# Patient Record
Sex: Male | Born: 1940 | Race: White | Hispanic: No | Marital: Single | Smoking: Never smoker
Health system: Southern US, Community
[De-identification: ages and names within clinical notes are randomized; demographics above are authoritative.]

---

## 2020-09-18 ENCOUNTER — Encounter (HOSPITAL_BASED_OUTPATIENT_CLINIC_OR_DEPARTMENT_OTHER): Payer: Self-pay | Admitting: *Deleted

## 2020-09-18 ENCOUNTER — Other Ambulatory Visit: Payer: Self-pay

## 2020-09-18 ENCOUNTER — Inpatient Hospital Stay (HOSPITAL_BASED_OUTPATIENT_CLINIC_OR_DEPARTMENT_OTHER)
Admission: EM | Admit: 2020-09-18 | Discharge: 2020-09-23 | DRG: 330 | Disposition: A | Discharge status: Home / Self Care | Payer: Medicare Other | Attending: Internal Medicine | Admitting: Internal Medicine

## 2020-09-18 DIAGNOSIS — C187 Malignant neoplasm of sigmoid colon: Secondary | ICD-10-CM | POA: Diagnosis not present

## 2020-09-18 DIAGNOSIS — R918 Other nonspecific abnormal finding of lung field: Secondary | ICD-10-CM | POA: Diagnosis present

## 2020-09-18 DIAGNOSIS — K56609 Unspecified intestinal obstruction, unspecified as to partial versus complete obstruction: Secondary | ICD-10-CM | POA: Diagnosis present

## 2020-09-18 DIAGNOSIS — Z20822 Contact with and (suspected) exposure to covid-19: Secondary | ICD-10-CM | POA: Diagnosis present

## 2020-09-18 DIAGNOSIS — J302 Other seasonal allergic rhinitis: Secondary | ICD-10-CM | POA: Diagnosis present

## 2020-09-18 DIAGNOSIS — R32 Unspecified urinary incontinence: Secondary | ICD-10-CM | POA: Diagnosis present

## 2020-09-18 DIAGNOSIS — Z9049 Acquired absence of other specified parts of digestive tract: Secondary | ICD-10-CM

## 2020-09-18 DIAGNOSIS — S3729XA Other injury of bladder, initial encounter: Secondary | ICD-10-CM | POA: Diagnosis not present

## 2020-09-18 DIAGNOSIS — K56699 Other intestinal obstruction unspecified as to partial versus complete obstruction: Secondary | ICD-10-CM

## 2020-09-18 DIAGNOSIS — Z6832 Body mass index (BMI) 32.0-32.9, adult: Secondary | ICD-10-CM

## 2020-09-18 DIAGNOSIS — E669 Obesity, unspecified: Secondary | ICD-10-CM | POA: Diagnosis present

## 2020-09-18 DIAGNOSIS — I700 Atherosclerosis of aorta: Secondary | ICD-10-CM | POA: Diagnosis present

## 2020-09-18 DIAGNOSIS — N400 Enlarged prostate without lower urinary tract symptoms: Secondary | ICD-10-CM | POA: Diagnosis present

## 2020-09-18 DIAGNOSIS — K56600 Partial intestinal obstruction, unspecified as to cause: Secondary | ICD-10-CM | POA: Diagnosis present

## 2020-09-18 NOTE — ED Triage Notes (Signed)
C/o constipation and bloating  x 4 days, laxative today w/o relief

## 2020-09-18 NOTE — ED Provider Notes (Addendum)
[facility] Provider Note: Georgena Spurling, MD, FACEP  CSN: [id number] MRN: [id number] ARRIVAL: 09/18/20 at 2311 ROOM: [place]  Constipation   HISTORY OF PRESENT ILLNESS  09/18/20 11:56 PM Craig Best is a 80 y.o. male who has not had a bowel movement for the past 3 days.  He feels his abdomen is distended and he is having pains which he describes as "gas pains".  He rates these as a 4 out of 10.  Nothing makes them better or worse.  He has taken a stool softener and a laxative (Dulcolax) without relief.  He has been able to eat but he feels constantly nauseated.  He has not been vomiting.  He has had excessive belching.  He had a bumpy tractor ride about a week ago and is concerned this may have initiated his problems.  This resulted in him having some soreness in his left lower and right lower quadrants which preceded his constipation.   History reviewed. No pertinent past medical history.  History reviewed. No pertinent surgical history.  No family history on file.  Social History   Tobacco Use   Smoking status: Never    Passive exposure: Never   Smokeless tobacco: Never  Substance Use Topics   Alcohol use: Not Currently   Drug use: Not Currently    Prior to Admission medications   Not on File    Allergies Patient has no known allergies.   REVIEW OF SYSTEMS  Negative except as noted here or in the History of Present Illness.   PHYSICAL EXAMINATION  Initial Vital Signs Blood pressure (!) 165/89, pulse 89, temperature 98 F (36.7 C), temperature source Oral, resp. rate 18, height 5\' 8"  (1.727 m), weight 93 kg, SpO2 98 %.  Examination General: Well-developed, well-nourished male in no acute distress; appearance consistent with age of record HENT: normocephalic; atraumatic Eyes: pupils equal, round and reactive to light; extraocular muscles intact Neck: supple Heart: regular rate and rhythm Abdomen: soft; distended; nontender;  bowel sounds hypoactive Rectal: Normal sphincter tone; soft formed stool in rectal vault without impaction Extremities: No deformity; full range of motion; pulses normal Neurologic: Awake, alert and oriented; motor function intact in all extremities and symmetric; no facial droop Skin: Warm and dry Psychiatric: Normal mood and affect   RESULTS  Summary of this visit's results, reviewed and interpreted by myself:   EKG Interpretation  Date/Time:    Ventricular Rate:    PR Interval:    QRS Duration:   QT Interval:    QTC Calculation:   R Axis:     Text Interpretation:         Laboratory Studies: Results for orders placed or performed during the hospital encounter of 09/18/20 (from the past 24 hour(s))  CBC with Differential/Platelet     Status: Abnormal   Collection Time: 09/19/20 12:06 AM  Result Value Ref Range   WBC 13.0 (H) 4.0 - 10.5 K/uL   RBC 5.22 4.22 - 5.81 MIL/uL   Hemoglobin 16.6 13.0 - 17.0 g/dL   HCT 48.1 39.0 - 52.0 %   MCV 92.1 80.0 - 100.0 fL   MCH 31.8 26.0 - 34.0 pg   MCHC 34.5 30.0 - 36.0 g/dL   RDW 13.3 11.5 - 15.5 %   Platelets 395 150 - 400 K/uL   nRBC 0.0 0.0 - 0.2 %   Neutrophils Relative % 75 %   Neutro Abs 9.7 (H) 1.7 - 7.7 K/uL   Lymphocytes  Relative 17 %   Lymphs Abs 2.2 0.7 - 4.0 K/uL   Monocytes Relative 6 %   Monocytes Absolute 0.8 0.1 - 1.0 K/uL   Eosinophils Relative 1 %   Eosinophils Absolute 0.1 0.0 - 0.5 K/uL   Basophils Relative 1 %   Basophils Absolute 0.1 0.0 - 0.1 K/uL   Immature Granulocytes 0 %   Abs Immature Granulocytes 0.05 0.00 - 0.07 K/uL  Basic metabolic panel     Status: Abnormal   Collection Time: 09/19/20 12:06 AM  Result Value Ref Range   Sodium 136 135 - 145 mmol/L   Potassium 3.8 3.5 - 5.1 mmol/L   Chloride 98 98 - 111 mmol/L   CO2 26 22 - 32 mmol/L   Glucose, Bld 126 (H) 70 - 99 mg/dL   BUN 14 8 - 23 mg/dL   Creatinine, Ser 1.05 0.61 - 1.24 mg/dL   Calcium 8.9 8.9 - 10.3 mg/dL   GFR, Estimated >60 >60  mL/min   Anion gap 12 5 - 15  Occult blood card to lab, stool Provider will collect     Status: None   Collection Time: 09/19/20 12:07 AM  Result Value Ref Range   Fecal Occult Bld NEGATIVE NEGATIVE  Urinalysis, Routine w reflex microscopic Urine, Clean Catch     Status: Abnormal   Collection Time: 09/19/20  2:00 AM  Result Value Ref Range   Color, Urine YELLOW YELLOW   APPearance CLEAR CLEAR   Specific Gravity, Urine 1.010 1.005 - 1.030   pH 5.0 5.0 - 8.0   Glucose, UA NEGATIVE NEGATIVE mg/dL   Hgb urine dipstick LARGE (A) NEGATIVE   Bilirubin Urine NEGATIVE NEGATIVE   Ketones, ur 15 (A) NEGATIVE mg/dL   Protein, ur NEGATIVE NEGATIVE mg/dL   Nitrite NEGATIVE NEGATIVE   Leukocytes,Ua NEGATIVE NEGATIVE  Urinalysis, Microscopic (reflex)     Status: None   Collection Time: 09/19/20  2:00 AM  Result Value Ref Range   RBC / HPF 6-10 0 - 5 RBC/hpf   WBC, UA 0-5 0 - 5 WBC/hpf   Bacteria, UA NONE SEEN NONE SEEN   Squamous Epithelial / LPF 0-5 0 - 5  Resp Panel by RT-PCR (Flu A&B, Covid) Nasopharyngeal Swab     Status: None   Collection Time: 09/19/20  2:13 AM   Specimen: Nasopharyngeal Swab; Nasopharyngeal(NP) swabs in vial transport medium  Result Value Ref Range   SARS Coronavirus 2 by RT PCR NEGATIVE NEGATIVE   Influenza A by PCR NEGATIVE NEGATIVE   Influenza B by PCR NEGATIVE NEGATIVE   Imaging Studies: CT ABDOMEN PELVIS W CONTRAST  Result Date: 09/19/2020 CLINICAL DATA:  Constipation, bloating.  Bowel obstruction suspected EXAM: CT ABDOMEN AND PELVIS WITH CONTRAST TECHNIQUE: Multidetector CT imaging of the abdomen and pelvis was performed using the standard protocol following bolus administration of intravenous contrast. CONTRAST:  162mL OMNIPAQUE IOHEXOL 300 MG/ML  SOLN COMPARISON:  None. FINDINGS: Lower chest: Right lower lobe nodule on image 8 measures 6 mm. Mild subpleural nodularity in the left lung base. No effusions. Heart is normal size. Hepatobiliary: No focal hepatic  abnormality. Gallbladder unremarkable. Pancreas: No focal abnormality or ductal dilatation. Spleen: No focal abnormality.  Normal size. Adrenals/Urinary Tract: No adrenal abnormality. No focal renal abnormality. No stones or hydronephrosis. Stomach/Bowel: Stomach and small bowel decompressed, unremarkable. There is moderate distention of the colon with stool and gas to the level of the sigmoid colon where there appears to be an annular constricting lesion extending approximately  4 cm in the mid sigmoid colon. The inferior aspect of this annular lesion appears to extend to involve the dome of the bladder. Vascular/Lymphatic: Aortic atherosclerosis. No evidence of aneurysm or adenopathy. Reproductive: Prostate enlargement with calcifications. Other: No free fluid or free air. Musculoskeletal: No acute bony abnormality. 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 This appears to cause at least partial obstruction with the colon moderately distended with large amount of stool and gas proximal to this lesion. This lesion appears to extend to involve the dome of the bladder. 6 mm right lower lobe pulmonary nodule. Small subpleural nodules in the left lower lung. Given the finding within the sigmoid colon, cannot exclude metastases. Aortic atherosclerosis. Prostate enlargement. These results were called by telephone at the time of interpretation on 09/19/2020 at 1:18 am to provider [facility] , who verbally acknowledged these results. Electronically Signed   By: Rolm Baptise M.D.   On: 09/19/2020 01:23    ED COURSE and MDM  Nursing notes, initial and subsequent vitals signs, including pulse oximetry, reviewed and interpreted by myself.  Vitals:   09/19/20 0200 09/19/20 0300 09/19/20 0400 09/19/20 0520  BP:  138/68 (!) 147/69 (!) 160/72  Pulse:  75 76 92  Resp:   16 16  Temp:      TempSrc:      SpO2: 99% 99%  94% 97%  Weight:      Height:       Medications  ondansetron (ZOFRAN) injection 4 mg (4 mg Intravenous Given 09/19/20 0510)  iohexol (OMNIPAQUE) 300 MG/ML solution 100 mL (100 mLs Intravenous Contrast Given 09/19/20 0045)  simethicone (MYLICON) 40 JA/2.5KN suspension 80 mg (80 mg Oral Given 09/19/20 0209)  acetaminophen (TYLENOL) suppository 650 mg (650 mg Rectal Given 09/19/20 0518)   2:14 AM Dr. Kieth Brightly consulted for general surgery.  He would like the patient admitted to the hospitalist service and they will consult.  PROCEDURES  Procedures   ED DIAGNOSES     ICD-10-CM   1. 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 Partial obstruction of colon ([place])  K56.600          Jacqueline Delapena, Jenny Reichmann, MD 09/19/20 0225    Shanon Rosser, MD 09/19/20 [phone]

## 2020-09-19 ENCOUNTER — Encounter (HOSPITAL_COMMUNITY): Payer: Self-pay | Admitting: Internal Medicine

## 2020-09-19 ENCOUNTER — Inpatient Hospital Stay (HOSPITAL_COMMUNITY): Payer: Medicare Other | Admitting: Anesthesiology

## 2020-09-19 ENCOUNTER — Emergency Department (HOSPITAL_BASED_OUTPATIENT_CLINIC_OR_DEPARTMENT_OTHER): Payer: Medicare Other

## 2020-09-19 ENCOUNTER — Encounter (HOSPITAL_COMMUNITY)
Admission: EM | Disposition: A | Discharge status: Home / Self Care | Payer: Self-pay | Source: Home / Self Care | Attending: Internal Medicine

## 2020-09-19 DIAGNOSIS — S3729XA Other injury of bladder, initial encounter: Secondary | ICD-10-CM | POA: Diagnosis not present

## 2020-09-19 DIAGNOSIS — K56699 Other intestinal obstruction unspecified as to partial versus complete obstruction: Secondary | ICD-10-CM | POA: Diagnosis not present

## 2020-09-19 DIAGNOSIS — R918 Other nonspecific abnormal finding of lung field: Secondary | ICD-10-CM | POA: Diagnosis present

## 2020-09-19 DIAGNOSIS — N400 Enlarged prostate without lower urinary tract symptoms: Secondary | ICD-10-CM | POA: Diagnosis present

## 2020-09-19 DIAGNOSIS — Z20822 Contact with and (suspected) exposure to covid-19: Secondary | ICD-10-CM | POA: Diagnosis present

## 2020-09-19 DIAGNOSIS — K56600 Partial intestinal obstruction, unspecified as to cause: Secondary | ICD-10-CM

## 2020-09-19 DIAGNOSIS — I700 Atherosclerosis of aorta: Secondary | ICD-10-CM | POA: Diagnosis present

## 2020-09-19 DIAGNOSIS — C801 Malignant (primary) neoplasm, unspecified: Secondary | ICD-10-CM

## 2020-09-19 DIAGNOSIS — Z9049 Acquired absence of other specified parts of digestive tract: Secondary | ICD-10-CM | POA: Diagnosis not present

## 2020-09-19 DIAGNOSIS — Z6832 Body mass index (BMI) 32.0-32.9, adult: Secondary | ICD-10-CM | POA: Diagnosis not present

## 2020-09-19 DIAGNOSIS — C187 Malignant neoplasm of sigmoid colon: Secondary | ICD-10-CM | POA: Diagnosis present

## 2020-09-19 DIAGNOSIS — E669 Obesity, unspecified: Secondary | ICD-10-CM | POA: Diagnosis present

## 2020-09-19 DIAGNOSIS — J302 Other seasonal allergic rhinitis: Secondary | ICD-10-CM | POA: Diagnosis present

## 2020-09-19 DIAGNOSIS — K56609 Unspecified intestinal obstruction, unspecified as to partial versus complete obstruction: Secondary | ICD-10-CM | POA: Diagnosis present

## 2020-09-19 DIAGNOSIS — R32 Unspecified urinary incontinence: Secondary | ICD-10-CM | POA: Diagnosis present

## 2020-09-19 HISTORY — DX: Partial intestinal obstruction, unspecified as to cause: K56.600

## 2020-09-19 HISTORY — PX: PARTIAL COLECTOMY: SHX5273

## 2020-09-19 HISTORY — DX: Malignant (primary) neoplasm, unspecified: C80.1

## 2020-09-19 LAB — TSH: TSH: 2.397 u[IU]/mL (ref 0.350–4.500)

## 2020-09-19 LAB — COMPREHENSIVE METABOLIC PANEL
ALT: 21 U/L (ref 0–44)
AST: 20 U/L (ref 15–41)
Albumin: 3.8 g/dL (ref 3.5–5.0)
Alkaline Phosphatase: 68 U/L (ref 38–126)
Anion gap: 15 (ref 5–15)
BUN: 15 mg/dL (ref 8–23)
CO2: 24 mmol/L (ref 22–32)
Calcium: 8.6 mg/dL — ABNORMAL LOW (ref 8.9–10.3)
Chloride: 99 mmol/L (ref 98–111)
Creatinine, Ser: 1.06 mg/dL (ref 0.61–1.24)
GFR, Estimated: >60 mL/min (ref >60)
Glucose, Bld: 142 mg/dL — ABNORMAL HIGH (ref 70–99)
Potassium: 3.7 mmol/L (ref 3.5–5.1)
Sodium: 138 mmol/L (ref 135–145)
Total Bilirubin: 0.9 mg/dL (ref 0.3–1.2)
Total Protein: 7.3 g/dL (ref 6.5–8.1)

## 2020-09-19 LAB — URINALYSIS, ROUTINE W REFLEX MICROSCOPIC
Bilirubin Urine: NEGATIVE
Glucose, UA: NEGATIVE mg/dL
Ketones, ur: 15 mg/dL — AB
Leukocytes,Ua: NEGATIVE
Nitrite: NEGATIVE
Protein, ur: NEGATIVE mg/dL
Specific Gravity, Urine: 1.01 (ref 1.005–1.030)
pH: 5 (ref 5.0–8.0)

## 2020-09-19 LAB — CBC WITH DIFFERENTIAL/PLATELET
Abs Immature Granulocytes: 0.05 10*3/uL (ref 0.00–0.07)
Abs Immature Granulocytes: 0.17 10*3/uL — ABNORMAL HIGH (ref 0.00–0.07)
Basophils Absolute: 0.1 10*3/uL (ref 0.0–0.1)
Basophils Absolute: 0.1 10*3/uL (ref 0.0–0.1)
Basophils Relative: 0 %
Basophils Relative: 1 %
Eosinophils Absolute: 0 10*3/uL (ref 0.0–0.5)
Eosinophils Absolute: 0.1 10*3/uL (ref 0.0–0.5)
Eosinophils Relative: 0 %
Eosinophils Relative: 1 %
HCT: 46 % (ref 39.0–52.0)
HCT: 48.1 % (ref 39.0–52.0)
Hemoglobin: 15.3 g/dL (ref 13.0–17.0)
Hemoglobin: 16.6 g/dL (ref 13.0–17.0)
Immature Granulocytes: 0 %
Immature Granulocytes: 1 %
Lymphocytes Relative: 17 %
Lymphocytes Relative: 6 %
Lymphs Abs: 1.6 10*3/uL (ref 0.7–4.0)
Lymphs Abs: 2.2 10*3/uL (ref 0.7–4.0)
MCH: 31.3 pg (ref 26.0–34.0)
MCH: 31.8 pg (ref 26.0–34.0)
MCHC: 33.3 g/dL (ref 30.0–36.0)
MCHC: 34.5 g/dL (ref 30.0–36.0)
MCV: 92.1 fL (ref 80.0–100.0)
MCV: 94.1 fL (ref 80.0–100.0)
Monocytes Absolute: 0.8 10*3/uL (ref 0.1–1.0)
Monocytes Absolute: 0.8 10*3/uL (ref 0.1–1.0)
Monocytes Relative: 3 %
Monocytes Relative: 6 %
Neutro Abs: 24.8 10*3/uL — ABNORMAL HIGH (ref 1.7–7.7)
Neutro Abs: 9.7 10*3/uL — ABNORMAL HIGH (ref 1.7–7.7)
Neutrophils Relative %: 75 %
Neutrophils Relative %: 90 %
Platelets: 395 10*3/uL (ref 150–400)
Platelets: 445 10*3/uL — ABNORMAL HIGH (ref 150–400)
RBC: 4.89 MIL/uL (ref 4.22–5.81)
RBC: 5.22 MIL/uL (ref 4.22–5.81)
RDW: 13.3 % (ref 11.5–15.5)
RDW: 13.5 % (ref 11.5–15.5)
WBC: 13 10*3/uL — ABNORMAL HIGH (ref 4.0–10.5)
WBC: 27.5 10*3/uL — ABNORMAL HIGH (ref 4.0–10.5)
nRBC: 0 % (ref 0.0–0.2)
nRBC: 0 % (ref 0.0–0.2)

## 2020-09-19 LAB — BASIC METABOLIC PANEL
Anion gap: 12 (ref 5–15)
BUN: 14 mg/dL (ref 8–23)
CO2: 26 mmol/L (ref 22–32)
Calcium: 8.9 mg/dL (ref 8.9–10.3)
Chloride: 98 mmol/L (ref 98–111)
Creatinine, Ser: 1.05 mg/dL (ref 0.61–1.24)
GFR, Estimated: >60 mL/min (ref >60)
Glucose, Bld: 126 mg/dL — ABNORMAL HIGH (ref 70–99)
Potassium: 3.8 mmol/L (ref 3.5–5.1)
Sodium: 136 mmol/L (ref 135–145)

## 2020-09-19 LAB — RESP PANEL BY RT-PCR (FLU A&B, COVID) ARPGX2
Influenza A by PCR: NEGATIVE
Influenza B by PCR: NEGATIVE
SARS Coronavirus 2 by RT PCR: NEGATIVE

## 2020-09-19 LAB — PHOSPHORUS: Phosphorus: 3.6 mg/dL (ref 2.5–4.6)

## 2020-09-19 LAB — HEMOGLOBIN A1C
Hgb A1c MFr Bld: 5.9 % — ABNORMAL HIGH (ref 4.8–5.6)
Mean Plasma Glucose: 122.63 mg/dL

## 2020-09-19 LAB — OCCULT BLOOD X 1 CARD TO LAB, STOOL: Fecal Occult Bld: NEGATIVE

## 2020-09-19 LAB — URINALYSIS, MICROSCOPIC (REFLEX): Bacteria, UA: NONE SEEN

## 2020-09-19 LAB — BRAIN NATRIURETIC PEPTIDE: B Natriuretic Peptide: 38.7 pg/mL (ref 0.0–100.0)

## 2020-09-19 LAB — MAGNESIUM: Magnesium: 2.4 mg/dL (ref 1.7–2.4)

## 2020-09-19 SURGERY — COLECTOMY, PARTIAL
Anesthesia: General | Site: Abdomen

## 2020-09-19 MED ORDER — ROCURONIUM BROMIDE 10 MG/ML (PF) SYRINGE
PREFILLED_SYRINGE | INTRAVENOUS | Status: DC | PRN
  Administered 2020-09-19: 80 mg via INTRAVENOUS
  Administered 2020-09-19: 20 mg via INTRAVENOUS

## 2020-09-19 MED ORDER — ACETAMINOPHEN 10 MG/ML IV SOLN
1000.0000 mg | Freq: Once | INTRAVENOUS | Status: DC | PRN
  Administered 2020-09-19: 1000 mg via INTRAVENOUS

## 2020-09-19 MED ORDER — LIDOCAINE 2% (20 MG/ML) 5 ML SYRINGE
INTRAMUSCULAR | Status: DC | PRN
  Administered 2020-09-19: 80 mg via INTRAVENOUS

## 2020-09-19 MED ORDER — ACETAMINOPHEN 325 MG PO TABS: 325.0000 mg | ORAL_TABLET | ORAL | Status: DC | PRN

## 2020-09-19 MED ORDER — ACETAMINOPHEN 500 MG PO TABS
1000.0000 mg | ORAL_TABLET | Freq: Four times a day (QID) | ORAL | Status: DC
  Administered 2020-09-19 – 2020-09-23 (×14): 1000 mg via ORAL
  Filled 2020-09-19 (×16): qty 2

## 2020-09-19 MED ORDER — PROMETHAZINE HCL 25 MG/ML IJ SOLN: 6.2500 mg | INTRAMUSCULAR | Status: DC | PRN

## 2020-09-19 MED ORDER — OXYCODONE HCL 5 MG PO TABS: 5.0000 mg | ORAL_TABLET | Freq: Once | ORAL | Status: DC | PRN

## 2020-09-19 MED ORDER — SODIUM CHLORIDE 0.9 % IV SOLN
2.0000 g | INTRAVENOUS | Status: AC
  Administered 2020-09-19: 2 g via INTRAVENOUS
  Filled 2020-09-19: qty 2

## 2020-09-19 MED ORDER — CHLORHEXIDINE GLUCONATE 0.12 % MT SOLN
15.0000 mL | Freq: Once | OROMUCOSAL | Status: AC
  Administered 2020-09-19: 15 mL via OROMUCOSAL

## 2020-09-19 MED ORDER — FENTANYL CITRATE (PF) 250 MCG/5ML IJ SOLN
INTRAMUSCULAR | Status: AC
  Filled 2020-09-19: qty 5

## 2020-09-19 MED ORDER — OXYCODONE HCL 5 MG/5ML PO SOLN: 5.0000 mg | Freq: Once | ORAL | Status: DC | PRN

## 2020-09-19 MED ORDER — IOHEXOL 300 MG/ML  SOLN
100.0000 mL | Freq: Once | INTRAMUSCULAR | Status: AC | PRN
  Administered 2020-09-19: 100 mL via INTRAVENOUS

## 2020-09-19 MED ORDER — PHENYLEPHRINE 40 MCG/ML (10ML) SYRINGE FOR IV PUSH (FOR BLOOD PRESSURE SUPPORT)
PREFILLED_SYRINGE | INTRAVENOUS | Status: DC | PRN
  Administered 2020-09-19 (×2): 80 ug via INTRAVENOUS

## 2020-09-19 MED ORDER — SODIUM CHLORIDE 0.9 % IV SOLN: INTRAVENOUS | Status: DC

## 2020-09-19 MED ORDER — CHLORHEXIDINE GLUCONATE CLOTH 2 % EX PADS
6.0000 | MEDICATED_PAD | Freq: Every day | CUTANEOUS | Status: DC
  Administered 2020-09-20 – 2020-09-23 (×4): 6 via TOPICAL

## 2020-09-19 MED ORDER — FENTANYL CITRATE (PF) 100 MCG/2ML IJ SOLN
INTRAMUSCULAR | Status: AC
  Filled 2020-09-19: qty 2

## 2020-09-19 MED ORDER — ACETAMINOPHEN 160 MG/5ML PO SOLN: 325.0000 mg | ORAL | Status: DC | PRN

## 2020-09-19 MED ORDER — 0.9 % SODIUM CHLORIDE (POUR BTL) OPTIME
TOPICAL | Status: DC | PRN
  Administered 2020-09-19: 2000 mL

## 2020-09-19 MED ORDER — ONDANSETRON HCL 4 MG/2ML IJ SOLN: 4.0000 mg | Freq: Once | INTRAMUSCULAR | Status: DC

## 2020-09-19 MED ORDER — METHOCARBAMOL 500 MG IVPB - SIMPLE MED
500.0000 mg | Freq: Four times a day (QID) | INTRAVENOUS | Status: DC
  Administered 2020-09-19 – 2020-09-21 (×7): 500 mg via INTRAVENOUS
  Filled 2020-09-19: qty 50
  Filled 2020-09-19 (×4): qty 500
  Filled 2020-09-19: qty 50
  Filled 2020-09-19 (×2): qty 500

## 2020-09-19 MED ORDER — SODIUM CHLORIDE 0.9% FLUSH
3.0000 mL | Freq: Two times a day (BID) | INTRAVENOUS | Status: DC
  Administered 2020-09-20 – 2020-09-22 (×6): 3 mL via INTRAVENOUS

## 2020-09-19 MED ORDER — SUGAMMADEX SODIUM 200 MG/2ML IV SOLN
INTRAVENOUS | Status: DC | PRN
  Administered 2020-09-19: 200 mg via INTRAVENOUS

## 2020-09-19 MED ORDER — HYDROMORPHONE HCL 1 MG/ML IJ SOLN
0.2500 mg | INTRAMUSCULAR | Status: DC | PRN
  Administered 2020-09-19 (×2): 0.5 mg via INTRAVENOUS

## 2020-09-19 MED ORDER — DEXAMETHASONE SODIUM PHOSPHATE 10 MG/ML IJ SOLN
INTRAMUSCULAR | Status: DC | PRN
  Administered 2020-09-19: 10 mg via INTRAVENOUS

## 2020-09-19 MED ORDER — ONDANSETRON HCL 4 MG/2ML IJ SOLN: 4.0000 mg | Freq: Four times a day (QID) | INTRAMUSCULAR | Status: DC | PRN

## 2020-09-19 MED ORDER — HYDROMORPHONE HCL 1 MG/ML IJ SOLN
INTRAMUSCULAR | Status: AC
  Filled 2020-09-19: qty 1

## 2020-09-19 MED ORDER — SIMETHICONE 40 MG/0.6ML PO SUSP (UNIT DOSE)
80.0000 mg | Freq: Once | ORAL | Status: AC
  Administered 2020-09-19: 80 mg via ORAL
  Filled 2020-09-19: qty 1.2

## 2020-09-19 MED ORDER — PROPOFOL 10 MG/ML IV BOLUS
INTRAVENOUS | Status: DC | PRN
Start: 2020-09-19 — End: 2020-09-19
  Administered 2020-09-19: 200 mg via INTRAVENOUS

## 2020-09-19 MED ORDER — FENTANYL CITRATE (PF) 100 MCG/2ML IJ SOLN
25.0000 ug | INTRAMUSCULAR | Status: DC | PRN
  Administered 2020-09-19 (×3): 50 ug via INTRAVENOUS

## 2020-09-19 MED ORDER — FENTANYL CITRATE (PF) 100 MCG/2ML IJ SOLN
INTRAMUSCULAR | Status: AC
  Administered 2020-09-19: 50 ug via INTRAVENOUS
  Filled 2020-09-19: qty 2

## 2020-09-19 MED ORDER — ONDANSETRON HCL 4 MG/2ML IJ SOLN
INTRAMUSCULAR | Status: DC | PRN
  Administered 2020-09-19: 4 mg via INTRAVENOUS

## 2020-09-19 MED ORDER — HYDROMORPHONE HCL 1 MG/ML IJ SOLN
0.5000 mg | INTRAMUSCULAR | Status: DC | PRN
  Administered 2020-09-19 – 2020-09-21 (×5): 1 mg via INTRAVENOUS
  Filled 2020-09-19 (×4): qty 1
  Filled 2020-09-19: qty 2
  Filled 2020-09-19: qty 1

## 2020-09-19 MED ORDER — ACETAMINOPHEN 10 MG/ML IV SOLN
INTRAVENOUS | Status: AC
  Filled 2020-09-19: qty 100

## 2020-09-19 MED ORDER — FENTANYL CITRATE (PF) 100 MCG/2ML IJ SOLN: 50.0000 ug | Freq: Once | INTRAMUSCULAR | Status: AC

## 2020-09-19 MED ORDER — FENTANYL CITRATE (PF) 100 MCG/2ML IJ SOLN
INTRAMUSCULAR | Status: DC | PRN
  Administered 2020-09-19 (×2): 50 ug via INTRAVENOUS
  Administered 2020-09-19: 100 ug via INTRAVENOUS
  Administered 2020-09-19: 50 ug via INTRAVENOUS

## 2020-09-19 MED ORDER — ONDANSETRON HCL 4 MG/2ML IJ SOLN
4.0000 mg | Freq: Three times a day (TID) | INTRAMUSCULAR | Status: DC | PRN
Start: 2020-09-19 — End: 2020-09-19
  Administered 2020-09-19 (×2): 4 mg via INTRAVENOUS
  Filled 2020-09-19 (×2): qty 2

## 2020-09-19 MED ORDER — AMISULPRIDE (ANTIEMETIC) 5 MG/2ML IV SOLN: 10.0000 mg | Freq: Once | INTRAVENOUS | Status: DC | PRN

## 2020-09-19 MED ORDER — ACETAMINOPHEN 650 MG RE SUPP
650.0000 mg | Freq: Once | RECTAL | Status: AC
  Administered 2020-09-19: 650 mg via RECTAL
  Filled 2020-09-19: qty 1

## 2020-09-19 MED ORDER — LACTATED RINGERS IV SOLN: INTRAVENOUS | Status: DC

## 2020-09-19 MED ORDER — HYDROMORPHONE HCL 1 MG/ML IJ SOLN: 0.5000 mg | INTRAMUSCULAR | Status: DC | PRN

## 2020-09-19 SURGICAL SUPPLY — 60 items
APPLICATOR COTTON TIP 6 STRL (MISCELLANEOUS) ×2 IMPLANT
APPLICATOR COTTON TIP 6IN STRL (MISCELLANEOUS) ×4 IMPLANT
BAG COUNTER SPONGE SURGICOUNT (BAG) IMPLANT
BLADE EXTENDED COATED 6.5IN (ELECTRODE) IMPLANT
BLADE HEX COATED 2.75 (ELECTRODE) ×2 IMPLANT
BLADE SURG SZ10 CARB STEEL (BLADE) IMPLANT
CATH FOLEY 2WAY SLVR  5CC 20FR (CATHETERS) ×1
CATH FOLEY 2WAY SLVR 5CC 20FR (CATHETERS) ×1 IMPLANT
CLIP TI LARGE 6 (CLIP) IMPLANT
CONTOUR STAPLER ×2 IMPLANT
COVER MAYO STAND STRL (DRAPES) ×2 IMPLANT
COVER SURGICAL LIGHT HANDLE (MISCELLANEOUS) ×2 IMPLANT
DRAIN CHANNEL 19F RND (DRAIN) ×2 IMPLANT
DRAPE LAPAROSCOPIC ABDOMINAL (DRAPES) ×2 IMPLANT
DRAPE SHEET LG 3/4 BI-LAMINATE (DRAPES) IMPLANT
DRAPE WARM FLUID 44X44 (DRAPES) ×2 IMPLANT
DRSG PAD ABDOMINAL 8X10 ST (GAUZE/BANDAGES/DRESSINGS) IMPLANT
ELECT REM PT RETURN 15FT ADLT (MISCELLANEOUS) ×2 IMPLANT
EVACUATOR SILICONE 100CC (DRAIN) ×2 IMPLANT
GAUZE SPONGE 4X4 12PLY STRL (GAUZE/BANDAGES/DRESSINGS) ×2 IMPLANT
GLOVE SURG ENC MOIS LTX SZ7.5 (GLOVE) ×2 IMPLANT
GLOVE SURG UNDER POLY LF SZ7 (GLOVE) ×2 IMPLANT
GOWN STRL REUS W/TWL LRG LVL3 (GOWN DISPOSABLE) ×2 IMPLANT
GOWN STRL REUS W/TWL XL LVL3 (GOWN DISPOSABLE) ×4 IMPLANT
HANDLE SUCTION POOLE (INSTRUMENTS) ×1 IMPLANT
KIT BASIN OR (CUSTOM PROCEDURE TRAY) ×2 IMPLANT
KIT TURNOVER KIT A (KITS) ×2 IMPLANT
LEGGING LITHOTOMY PAIR STRL (DRAPES) IMPLANT
LIGASURE IMPACT 36 18CM CVD LR (INSTRUMENTS) ×2 IMPLANT
NS IRRIG 1000ML POUR BTL (IV SOLUTION) ×4 IMPLANT
PACK GENERAL/GYN (CUSTOM PROCEDURE TRAY) ×2 IMPLANT
PENCIL SMOKE EVACUATOR (MISCELLANEOUS) IMPLANT
SHEARS HARMONIC ACE PLUS 36CM (ENDOMECHANICALS) IMPLANT
STAPLER CVD CUT BL 40 RELOAD (ENDOMECHANICALS) ×2 IMPLANT
STAPLER PROXIMATE 75MM BLUE (STAPLE) ×2 IMPLANT
STAPLER VISISTAT 35W (STAPLE) ×2 IMPLANT
SUCTION POOLE HANDLE (INSTRUMENTS) ×2
SUT CHROMIC 3 0 SH 27 (SUTURE) ×2 IMPLANT
SUT ETHILON 2 0 PS N (SUTURE) ×2 IMPLANT
SUT NOV 1 T60/GS (SUTURE) IMPLANT
SUT NOVA NAB DX-16 0-1 5-0 T12 (SUTURE) IMPLANT
SUT NOVA T20/GS 25 (SUTURE) IMPLANT
SUT PDS AB 1 TP1 96 (SUTURE) ×4 IMPLANT
SUT PROLENE 2 0 CT 1 (SUTURE) ×2 IMPLANT
SUT SILK 2 0 (SUTURE) ×1
SUT SILK 2 0 SH CR/8 (SUTURE) ×2 IMPLANT
SUT SILK 2 0SH CR/8 30 (SUTURE) IMPLANT
SUT SILK 2-0 18XBRD TIE 12 (SUTURE) ×1 IMPLANT
SUT SILK 2-0 30XBRD TIE 12 (SUTURE) IMPLANT
SUT SILK 3 0 (SUTURE) ×1
SUT SILK 3 0 SH CR/8 (SUTURE) ×2 IMPLANT
SUT SILK 3-0 18XBRD TIE 12 (SUTURE) ×1 IMPLANT
SUT VIC AB 2-0 CT1 27 (SUTURE) ×2
SUT VIC AB 2-0 CT1 TAPERPNT 27 (SUTURE) ×2 IMPLANT
SUT VIC AB 3-0 SH 27 (SUTURE) ×1
SUT VIC AB 3-0 SH 27XBRD (SUTURE) ×1 IMPLANT
TOWEL OR 17X26 10 PK STRL BLUE (TOWEL DISPOSABLE) ×4 IMPLANT
TRAY FOLEY MTR SLVR 16FR STAT (SET/KITS/TRAYS/PACK) ×2 IMPLANT
WATER STERILE IRR 1000ML POUR (IV SOLUTION) IMPLANT
YANKAUER SUCT BULB TIP NO VENT (SUCTIONS) ×2 IMPLANT

## 2020-09-19 NOTE — Op Note (Signed)
PARTIAL COLECTOMY, WITH CREATION OF COLOSTOMY, COMPLEX BLADDER REPAIR  Procedure Note  Craig Best 09/18/2020 - 09/19/2020   Pre-op Diagnosis: COLON OBSTRUCTION     Post-op Diagnosis: COLON OBSTRUCTION   Procedure(s): PARTIAL COLECTOMY, END COLOSTOMY (HARTMAN'S PROCEDURE) COMPLEX BLADDER REPAIR  Surgeon(s): Coralie Keens, MD Link Snuffer, MD  Assistant: Zacarias Pontes, MD   Anesthesia: General  Staff:  Circulator: Lauris Chroman, RN Relief Circulator: Tamala Julian, RN Scrub Person: Madalyn Rob  Estimated Blood Loss: Minimal               Specimens: sent to path  Indications: [name] who presents with a distal colon obstruction.  He was completely obstructed so the decision was made to proceed to the operating room for resection and colostomy  Findings: The patient was found to have complete obstruction of the sigmoid colon from a intraluminal mass with erosion into the dome of the bladder.  Resection of part of the bladder and complex repair was necessary.  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  It was sent to pathology for evaluation.  Procedure: The patient was brought to operating identifies correct patient.  He is placed upon the operating table general anesthesia was induced.  A Foley catheter was inserted prior to prepping and draping.  Patient's abdomen is prepped and draped in usual sterile fashion including the Foley.  A midline incision was then created in the lower abdomen going from just above the umbilicus to the pubis.  This was then taken down through the fascia and then peritoneum the entire length of the incision.  Upon entering the abdomen a hard mass can be felt in the distal sigmoid colon which was fixated to the dome of the bladder right at the incision.  I had to dissect into the bladder in order to get a cuff of tissue around the mass that was eroding through the colon into the bladder.   Again this was right at the dome of the bladder.  I was then able to transect the colon distal to this with the contour stapler.  We then transected the distal colon proximal to the obstructing mass with a GIA 75 stapler.  The mesentery was then taken down with the LigaSure stapling device.  I then marked the distal margin with a silk suture and the specimen sent to pathology for evaluation.  Dr. Link Snuffer from urology did an intraoperative consult and complex repair of the bladder.  This was done with a running 3-0 Vicryl suture in the first layer and then a second running layer of 3-0 chromic and then another 2-0 Vicryl suture running.  He then instilled the bladder full of saline through the Foley and saw no evidence of leak.  It then replace current Foley with a larger Foley under direct vision.  At this point we then made an elliptical incision the patient's left upper quadrant from the previous marked ostomy site.  This was done with the cautery.  We then dissected down to the fascia which was opened in cruciate manner.  The underlying muscle fibers were then retracted and the peritoneum was opened.  We then mobilized the descending colon further along the white line of [place].  He had a very distended colon.  There was a hard nodule lateral to the colon which may represent a diverticulum.  I elected to leave this with the colon in that area for fear of placing a hole in  the colon which would then necessitate can pleat takedown of the splenic flexure.  We took down more the mesentery of the colon and they were able to pull descending colon out as a end colostomy.  The patient's midline fascia was then closed with running #1 looped PDS suture.  The skin was irrigated closed skin staples.  We then took down stable the ostomy matured circumferentially with interrupted 3-0 Vicryl sutures.  The ostomy was slightly dusky but appeared perfused.  An ostomy appliance and dressings were applied.  The patient tolerated  procedure well.  All the counts were correct at the end of the procedure.  The patient was then extubated in the operating room and taken in stable addition to the recovery room.            Coralie Keens   Date: 09/19/2020  Time: 3:27 PM

## 2020-09-19 NOTE — Anesthesia Procedure Notes (Signed)
Procedure Name: Intubation Date/Time: 09/19/2020 1:21 PM Performed by: Gerald Leitz, CRNA Pre-anesthesia Checklist: Patient identified, Patient being monitored, Timeout performed, Emergency Drugs available and Suction available Patient Re-evaluated:Patient Re-evaluated prior to induction Oxygen Delivery Method: Circle system utilized Preoxygenation: Pre-oxygenation with 100% oxygen Induction Type: IV induction Ventilation: Mask ventilation without difficulty Laryngoscope Size: Mac and 3 Grade View: Grade I Tube type: Oral Tube size: 7.5 mm Number of attempts: 1 Airway Equipment and Method: Stylet Placement Confirmation: ETT inserted through vocal cords under direct vision, positive ETCO2 and breath sounds checked- equal and bilateral Secured at: 22 cm Tube secured with: Tape Dental Injury: Teeth and Oropharynx as per pre-operative assessment

## 2020-09-19 NOTE — Anesthesia Preprocedure Evaluation (Addendum)
Anesthesia Evaluation  Patient identified by MRN, date of birth, ID band Patient awake    Reviewed: Allergy & Precautions, H&P , NPO status , Patient's Chart, lab work & pertinent test results, reviewed documented beta blocker date and time   Airway Mallampati: III  TM Distance: >3 FB Neck ROM: full    Dental no notable dental hx. (+) Poor Dentition, Missing, Edentulous Upper,    Pulmonary neg pulmonary ROS,    Pulmonary exam normal breath sounds clear to auscultation       Cardiovascular Exercise Tolerance: Good negative cardio ROS   Rhythm:regular Rate:Normal     Neuro/Psych negative neurological ROS  negative psych ROS   GI/Hepatic negative GI ROS, Neg liver ROS,   Endo/Other  negative endocrine ROS  Renal/GU negative Renal ROS  negative genitourinary   Musculoskeletal   Abdominal   Peds  Hematology negative hematology ROS (+)   Anesthesia Other Findings   Reproductive/Obstetrics negative OB ROS                            Anesthesia Physical Anesthesia Plan  ASA: 3  Anesthesia Plan: General   Post-op Pain Management:    Induction: Intravenous, Rapid sequence and Cricoid pressure planned  PONV Risk Score and Plan: 2 and Ondansetron and Dexamethasone  Airway Management Planned: Oral ETT  Additional Equipment: None  Intra-op Plan:   Post-operative Plan: Extubation in OR  Informed Consent: I have reviewed the patients History and Physical, chart, labs and discussed the procedure including the risks, benefits and alternatives for the proposed anesthesia with the patient or authorized representative who has indicated his/her understanding and acceptance.     Dental Advisory Given  Plan Discussed with: CRNA and Anesthesiologist  Anesthesia Plan Comments:        Anesthesia Quick Evaluation

## 2020-09-19 NOTE — H&P (Signed)
History and Physical    [name] BLT:[id number] DOB: May 19, 1940 DOA: 09/18/2020  PCP: Pcp, No  Patient coming from:  home  I have personally briefly reviewed patient's old medical records in [place]  Chief Complaint: constipation /abd pain/ nausea x 3 days   HPI: Craig Best is a 80 y.o. male with no significant past medical history  Who presents to ED with complaint of constipation and abdominal pain x 3 days with assc nausea.  He notes that pain felt like gas pain. He tried laxatives at home w/o relief. He states he is able to tolerate po but had nausea , but denies any vomiting.  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 Surgery was consulted who recommended admission to medicine service with surgical consultation. Patient hospital course complicated by continued pain and distention.  Patient was then urgently  taking to OR for surgical intervention with partial colectomy and end colostomy. Patient currently s/p surgery, noted he feels much improved, no sob, chest pain , nausea. , notes appropriate surgical pain with movement but otherwise feels well. He states that the only medical history his has was asthma as a small child which he outgrew and note only seasonal allergies that he take over the counter allergy medicine for. 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 He noted no family history of CAD , CVA, DMII or cancer and noted his father had kidney problems when he was in his 31's. He denies any tobacco  or ETOH use any prior surgery.   ED Course:  Afeb, bp 165/89, hr 89,  rr18 sat 98% on ra  As noted above Labs: Wbc 13, hgb 16.6 plt395 Glu126,cr1.05 Fecal occult:negative Urine dip: +heme, no infection noted Covid:negative Tx zofran, tylenol supository  CT  abd/pelvis: FINDINGS: Lower chest: Right lower lobe nodule on image 8 measures 6 mm. Mild subpleural nodularity in the left lung base. No effusions. Heart is normal size.   Hepatobiliary: No focal hepatic abnormality. Gallbladder unremarkable.   Pancreas: No focal abnormality or ductal dilatation.   Spleen: No focal abnormality.  Normal size.   Adrenals/Urinary Tract: No adrenal abnormality. No focal renal abnormality. No stones or hydronephrosis.   Stomach/Bowel: Stomach and small bowel decompressed, unremarkable. There is moderate distention of the colon with stool and gas to the level of the sigmoid colon where there appears to be an annular constricting lesion extending approximately 4 cm in the mid sigmoid colon. The inferior aspect of this annular lesion appears to extend to involve the dome of the bladder.   Vascular/Lymphatic: Aortic atherosclerosis. No evidence of aneurysm or adenopathy.   Reproductive: Prostate enlargement with calcifications.   Other: No free fluid or free air.   Musculoskeletal: No acute bony abnormality.   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 This appears to cause at least partial obstruction with the colon moderately distended with large amount of stool and gas proximal to this lesion. This lesion appears to extend to involve the dome of the bladder.   6 mm right lower lobe pulmonary nodule. Small subpleural nodules in the left lower lung. Given the finding within the sigmoid colon, cannot exclude metastases.   Aortic atherosclerosis.   Prostate enlargement.  Review of Systems: As per HPI otherwise 10 point review of systems negative.   History reviewed. No pertinent past medical history.  History reviewed. No pertinent surgical history.   reports that he has never smoked. He has never been exposed to tobacco smoke. He  has never used smokeless tobacco. He reports that he does not currently use alcohol. He reports that he does not currently use drugs.  No Known Allergies  History reviewed. No pertinent family history.  Prior to Admission medications   Not on File    Physical Exam: Vitals:   09/19/20 1211 09/19/20 1216 09/19/20 1234 09/19/20 1255  BP:  (!) 145/59    Pulse:  93 87 86  Resp:  18 18 16   Temp:  98.5 F (36.9 C)    TempSrc:  Oral    SpO2:  97% 97% 96%  Weight: 93 kg     Height: 5\' 8"  (1.727 m)        Vitals:   09/19/20 1211 09/19/20 1216 09/19/20 1234 09/19/20 1255  BP:  (!) 145/59    Pulse:  93 87 86  Resp:  18 18 16   Temp:  98.5 F (36.9 C)    TempSrc:  Oral    SpO2:  97% 97% 96%  Weight: 93 kg     Height: 5\' 8"  (1.727 m)     Constitutional: NAD, calm, comfortable Eyes: PERRL, lids and conjunctivae normal ENMT: Mucous membranes are moist. Posterior pharynx clear of any exudate or lesions.Normal dentition.  Neck: normal, supple, no masses, no thyromegaly Respiratory: clear to auscultation bilaterally, no wheezing, no crackles. Normal respiratory effort. No accessory muscle use.  Cardiovascular: Regular rate and rhythm, no murmurs / rubs / gallops. No extremity edema. 2+ pedal pulses. No carotid bruits.  Abdomen: appropriate surgical tenderness, no masses palpated. No hepatosplenomegaly. +Bowel sounds positive. Distended , +colostomy stoma intact . Musculoskeletal: no clubbing / cyanosis. No joint deformity upper and lower extremities. Good ROM, no contractures. Normal muscle tone.  Skin: no rashes, lesions, ulcers. No induration Neurologic: CN 2-12 grossly intact. Sensation intact, Strength 5/5 in all 4.  Psychiatric: Normal judgment and insight. Alert and oriented x 3. Normal mood.    Labs on Admission: I have personally reviewed following labs and imaging studies  CBC: Recent Labs  Lab 09/19/20 0006  WBC 13.0*  NEUTROABS 9.7*  HGB 16.6  HCT 48.1  MCV 92.1   PLT 409   Basic Metabolic Panel: Recent Labs  Lab 09/19/20 0006  NA 136  K 3.8  CL 98  CO2 26  GLUCOSE 126*  BUN 14  CREATININE 1.05  CALCIUM 8.9   GFR: Estimated Creatinine Clearance: 65.2 mL/min (by C-G formula based on SCr of 1.05 mg/dL). Liver Function Tests: No results for input(s): AST, ALT, ALKPHOS, BILITOT, PROT, ALBUMIN in the last 168 hours. No results for input(s): LIPASE, AMYLASE in the last 168 hours. No results for input(s): AMMONIA in the last 168 hours. Coagulation Profile: No results for input(s): INR, PROTIME in the last 168 hours. Cardiac Enzymes: No results for input(s): CKTOTAL, CKMB, CKMBINDEX, TROPONINI in the last 168 hours. BNP (last 3 results) No results for input(s): PROBNP in the last 8760 hours. HbA1C: No results for input(s): HGBA1C in the last 72 hours. CBG: No results for input(s): GLUCAP in the last 168 hours. Lipid Profile: No results for input(s): CHOL, HDL, LDLCALC, TRIG, CHOLHDL, LDLDIRECT in the last 72 hours. Thyroid Function Tests: No results for input(s): TSH, T4TOTAL, FREET4, T3FREE,  THYROIDAB in the last 72 hours. Anemia Panel: No results for input(s): VITAMINB12, FOLATE, FERRITIN, TIBC, IRON, RETICCTPCT in the last 72 hours. Urine analysis:    Component Value Date/Time   COLORURINE YELLOW 09/19/2020 0200   APPEARANCEUR CLEAR 09/19/2020 0200   LABSPEC 1.010 09/19/2020 0200   PHURINE 5.0 09/19/2020 0200   GLUCOSEU NEGATIVE 09/19/2020 0200   HGBUR LARGE (A) 09/19/2020 0200   BILIRUBINUR NEGATIVE 09/19/2020 0200   KETONESUR 15 (A) 09/19/2020 0200   PROTEINUR NEGATIVE 09/19/2020 0200   NITRITE NEGATIVE 09/19/2020 0200   LEUKOCYTESUR NEGATIVE 09/19/2020 0200    Radiological Exams on Admission: CT ABDOMEN PELVIS W CONTRAST  Result Date: 09/19/2020 CLINICAL DATA:  Constipation, bloating.  Bowel obstruction suspected EXAM: CT ABDOMEN AND PELVIS WITH CONTRAST TECHNIQUE: Multidetector CT imaging of the abdomen and pelvis was  performed using the standard protocol following bolus administration of intravenous contrast. CONTRAST:  181mL OMNIPAQUE IOHEXOL 300 MG/ML  SOLN COMPARISON:  None. FINDINGS: Lower chest: Right lower lobe nodule on image 8 measures 6 mm. Mild subpleural nodularity in the left lung base. No effusions. Heart is normal size. Hepatobiliary: No focal hepatic abnormality. Gallbladder unremarkable. Pancreas: No focal abnormality or ductal dilatation. Spleen: No focal abnormality.  Normal size. Adrenals/Urinary Tract: No adrenal abnormality. No focal renal abnormality. No stones or hydronephrosis. Stomach/Bowel: Stomach and small bowel decompressed, unremarkable. There is moderate distention of the colon with stool and gas to the level of the sigmoid colon where there appears to be an annular constricting lesion extending approximately 4 cm in the mid sigmoid colon. The inferior aspect of this annular lesion appears to extend to involve the dome of the bladder. Vascular/Lymphatic: Aortic atherosclerosis. No evidence of aneurysm or adenopathy. Reproductive: Prostate enlargement with calcifications. Other: No free fluid or free air. Musculoskeletal: No acute bony abnormality. 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 This appears to cause at least partial obstruction with the colon moderately distended with large amount of stool and gas proximal to this lesion. This lesion appears to extend to involve the dome of the bladder. 6 mm right lower lobe pulmonary nodule. Small subpleural nodules in the left lower lung. Given the finding within the sigmoid colon, cannot exclude metastases. Aortic atherosclerosis. Prostate enlargement. These results were called by telephone at the time of interpretation on 09/19/2020 at 1:18 am to provider [facility] , who verbally acknowledged these results. Electronically Signed   By: Rolm Baptise M.D.   On: 09/19/2020 01:23    EKG: Independently reviewed. 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  Leukocytosis -stress response   DVT prophylaxis: per surgery  otherwise scd Code Status: full Family Communication:  at bed side hunt,marie (Friend)  [phone] (Mobile) Disposition Plan:patient  expected to be admitted greater than 2 midnights  Consults called: oncology DR  Benay Spice ( please call in am ) Admission status: inpatient tele   Clance Boll MD [facility]   If 7PM-7AM, please contact night-coverage [email or web address] Password [email or web address]  09/19/2020, 1:33 PM

## 2020-09-19 NOTE — Progress Notes (Addendum)
Patient ID: Craig Best, male   DOB: Oct 01, 1940, 80 y.o.   MRN: [id number]   This is a pleasant gentleman with no prior medical or surgical history and who has never had a colonoscopy who presents with a colon obstruction from a likely sigmoid colon mass. He is very distended on physical examination and has not had a bowel movement or passed flatus in 4 days. I reviewed his CT scan I have discussed the findings with the patient and his family.  I strongly recommend proceeding to the operating today for a partial colectomy and end colostomy.  The tumor may be invading the bladder and I have notified Urology that an intraop cosult may be necessary. I explained the reasons for this with them in detail.  I discussed the reasons for being unable to do a bowel anastomosis at this time given the complete obstruction and inability to prep the patient. The discussed the risks of the procedure which includes but are not limited to bleeding, infection, injury to surrounding structures, the need for further procedures, ostomy formation, cardiopulmonary issues, etc.  They understand and agree to proceed with surgery today.

## 2020-09-19 NOTE — Consult Note (Signed)
[facility] Nurse ostomy consult note [facility] Nurse requested for preoperative stoma site marking by Dr. Ninfa Linden.  Discussed preferred site with [facility] PA-C K. Johnson.  Discussed surgical procedure and stoma creation with patient and family.  Explained role of the [place] nurse team.  Answered patient and family questions. Patient is sitting up in the chair and is uncomfortable, but able to converse and participate in marking. He and his wife note that the abdomen is very distended and I am not able to determine if their is creasing or wrinkles in the area. To the best of their knowledge, they report the skin in smooth in the area selected.  Patient reports that Dr. Ninfa Linden explained that a temporary ostomy may be required intraoperatively.  Examined patient sitting, and standing in order to place the marking in the patient's visual field, away from any creases or abdominal contour issues and within the rectus muscle.  Patient wears his belt and the waistband of his pants quite low on the abdomen.  Marked for colostomy in the LUQ:  7cm to the left of the umbilicus and 3 cm above the umbilicus.  Patient's abdomen cleansed with CHG wipes at site markings, allowed to air dry prior to marking.Covered mark with thin film transparent dressing to preserve mark until date of surgery.   [facility] Nurse team will follow up with patient after surgery for continued ostomy care and teaching should a stoma be necessary during surgery.  Thank you for allowing me to meet and mark this nice gentleman preoperatively.  Maudie Flakes, MSN, RN, [place], [facility]  Pager# [phone]

## 2020-09-19 NOTE — Transfer of Care (Signed)
Immediate Anesthesia Transfer of Care Note  Patient: Craig Best  Procedure(s) Performed: Procedure(s): PARTIAL COLECTOMY, WITH CREATION OF COLOSTOMY, COMPLEX BLADDER REPAIR (N/A)  Patient Location: PACU  Anesthesia Type:General  Level of Consciousness: Alert, Awake, Oriented  Airway & Oxygen Therapy: Patient Spontanous Breathing  Post-op Assessment: Report given to RN  Post vital signs: Reviewed and stable  Last Vitals:  Vitals:   09/19/20 1234 09/19/20 1255  BP:    Pulse: 87 86  Resp: 18 16  Temp:    SpO2: 15% 83%    Complications: No apparent anesthesia complications

## 2020-09-19 NOTE — Progress Notes (Signed)
Spoke to floor nurse, received report for surgery this afternoon.

## 2020-09-19 NOTE — Consult Note (Signed)
Consult Note  Craig Best November 07, 1940  [id number].    Requesting MD: Dr. Shanon Rosser  Chief Complaint/Reason for Consult: colonic obstruction  HPI:  Patient is a 80 year old male who presented to [facility] yesterday with abdominal pain, distention and constipation. He reports that over the last several months he has had more constipation with having to strain to have BMs and very hard and thinner stools. He reports that since Monday this week he has been having generalized abdominal pain that feels cramping like gas pain. He has been passing a little gas but only every so often. He has had nausea and vomited at [facility] yesterday AM. He tried taking stool softener and laxatives without any relief in symptoms. He reports some associated urinary incontinence and hesitancy in stream over the last several months as well. He reports mild chills. He denies fever, chest pain, SOB. He has never had a colonoscopy. He reports a sister that had some sort of gastric malignancy but no known family history of colon cancer. He has never smoked cigarettes and does not use alcohol or illicit drugs. 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 NKDA. No past abdominal surgery. He is not on any blood thinning medications.   ROS: Review of Systems  Constitutional:  Positive for chills. Negative for fever and weight loss.  Respiratory:  Negative for shortness of breath and wheezing.   Cardiovascular:  Negative for chest pain, palpitations and leg swelling.  Gastrointestinal:  Positive for abdominal pain, constipation, nausea and vomiting. Negative for blood in stool, diarrhea and melena.       Pt reports hard stools and thin stools over the last few months   Genitourinary:  Positive for frequency and urgency. Negative for dysuria.  All other systems reviewed and are negative.  No family history on file.  History reviewed. No pertinent past medical history.  History reviewed. No pertinent surgical  history.  Social History:  reports that he has never smoked. He has never been exposed to tobacco smoke. He has never used smokeless tobacco. He reports that he does not currently use alcohol. He reports that he does not currently use drugs.  Allergies: No Known Allergies  No medications prior to admission.    Blood pressure 132/77, pulse 90, temperature 98.3 F (36.8 C), temperature source Oral, resp. rate 20, height 5\' 8"  (1.727 m), weight 93 kg, SpO2 99 %. Physical Exam:  General: pleasant, WD, obese male who is sitting up in NAD HEENT: head is normocephalic, atraumatic.  Sclera are noninjected.  PERRL.  Ears and nose without any masses or lesions.  Mouth is pink and moist Heart: regular, rate, and rhythm.  Normal s1,s2. No obvious murmurs, gallops, or rubs noted.  Palpable radial and pedal pulses bilaterally Lungs: CTAB, no wheezes, rhonchi, or rales noted.  Respiratory effort nonlabored Abd: firm, mild diffuse ttp without peritonitis, very distended, high pitched tinkling bowel sounds  MS: all 4 extremities are symmetrical with no cyanosis, clubbing, or edema. Skin: warm and dry with no masses, lesions, or rashes Neuro: Cranial nerves 2-12 grossly intact, sensation is normal throughout Psych: A&Ox3 with an appropriate affect.   Results for orders placed or performed during the hospital encounter of 09/18/20 (from the past 48 hour(s))  CBC with Differential/Platelet     Status: Abnormal   Collection Time: 09/19/20 12:06 AM  Result Value Ref Range   WBC 13.0 (H) 4.0 - 10.5 K/uL   RBC 5.22 4.22 -  5.81 MIL/uL   Hemoglobin 16.6 13.0 - 17.0 g/dL   HCT 48.1 39.0 - 52.0 %   MCV 92.1 80.0 - 100.0 fL   MCH 31.8 26.0 - 34.0 pg   MCHC 34.5 30.0 - 36.0 g/dL   RDW 13.3 11.5 - 15.5 %   Platelets 395 150 - 400 K/uL   nRBC 0.0 0.0 - 0.2 %   Neutrophils Relative % 75 %   Neutro Abs 9.7 (H) 1.7 - 7.7 K/uL   Lymphocytes Relative 17 %   Lymphs Abs 2.2 0.7 - 4.0 K/uL   Monocytes Relative 6 %    Monocytes Absolute 0.8 0.1 - 1.0 K/uL   Eosinophils Relative 1 %   Eosinophils Absolute 0.1 0.0 - 0.5 K/uL   Basophils Relative 1 %   Basophils Absolute 0.1 0.0 - 0.1 K/uL   Immature Granulocytes 0 %   Abs Immature Granulocytes 0.05 0.00 - 0.07 K/uL    Comment: Performed at [facility], [address] [place], [place] [address]  Basic metabolic panel     Status: Abnormal   Collection Time: 09/19/20 12:06 AM  Result Value Ref Range   Sodium 136 135 - 145 mmol/L   Potassium 3.8 3.5 - 5.1 mmol/L   Chloride 98 98 - 111 mmol/L   CO2 26 22 - 32 mmol/L   Glucose, Bld 126 (H) 70 - 99 mg/dL    Comment: Glucose reference range applies only to samples taken after fasting for at least 8 hours.   BUN 14 8 - 23 mg/dL   Creatinine, Ser 1.05 0.61 - 1.24 mg/dL   Calcium 8.9 8.9 - 10.3 mg/dL   GFR, Estimated >60 >60 mL/min    Comment: (NOTE) Calculated using the CKD-EPI Creatinine Equation (2021)    Anion gap 12 5 - 15    Comment: Performed at [facility], [address] [place], [place] [address]  Occult blood card to lab, stool Provider will collect     Status: None   Collection Time: 09/19/20 12:07 AM  Result Value Ref Range   Fecal Occult Bld NEGATIVE NEGATIVE    Comment: Performed at [facility], [address] [place], [place] [address]  Urinalysis, Routine w reflex microscopic Urine, Clean Catch     Status: Abnormal   Collection Time: 09/19/20  2:00 AM  Result Value Ref Range   Color, Urine YELLOW YELLOW   APPearance CLEAR CLEAR   Specific Gravity, Urine 1.010 1.005 - 1.030   pH 5.0 5.0 - 8.0   Glucose, UA NEGATIVE NEGATIVE mg/dL   Hgb urine dipstick LARGE (A) NEGATIVE   Bilirubin Urine NEGATIVE NEGATIVE   Ketones, ur 15 (A) NEGATIVE mg/dL   Protein, ur NEGATIVE NEGATIVE mg/dL   Nitrite NEGATIVE NEGATIVE   Leukocytes,Ua NEGATIVE NEGATIVE    Comment: Performed at [place] [facility], [address] [place], [place] [address]   Urinalysis, Microscopic (reflex)     Status: None   Collection Time: 09/19/20  2:00 AM  Result Value Ref Range   RBC / HPF 6-10 0 - 5 RBC/hpf   WBC, UA 0-5 0 - 5 WBC/hpf   Bacteria, UA NONE SEEN NONE SEEN   Squamous Epithelial / LPF 0-5 0 - 5    Comment: Performed at [facility], [address] [place], [place] [address]  Resp Panel by RT-PCR (Flu A&B, Covid) Nasopharyngeal Swab     Status: None  Collection Time: 09/19/20  2:13 AM   Specimen: Nasopharyngeal Swab; Nasopharyngeal(NP) swabs in vial transport medium  Result Value Ref Range   SARS Coronavirus 2 by RT PCR NEGATIVE NEGATIVE    Comment: (NOTE) SARS-CoV-2 target nucleic acids are NOT DETECTED.  The SARS-CoV-2 RNA is generally detectable in upper respiratory specimens during the acute phase of infection. The lowest concentration of SARS-CoV-2 viral copies this assay can detect is 138 copies/mL. A negative result does not preclude SARS-Cov-2 infection and should not be used as the sole basis for treatment or other patient management decisions. A negative result may occur with  improper specimen collection/handling, submission of specimen other than nasopharyngeal swab, presence of viral mutation(s) within the areas targeted by this assay, and inadequate number of viral copies(<138 copies/mL). A negative result must be combined with clinical observations, patient history, and epidemiological information. The expected result is Negative.  Fact Sheet for Patients:  EntrepreneurPulse.com.au  Fact Sheet for Healthcare Providers:  IncredibleEmployment.be  This test is no t yet approved or cleared by the [place] FDA and  has been authorized for detection and/or diagnosis of SARS-CoV-2 by FDA under an Emergency Use Authorization (EUA). This EUA will remain  in effect (meaning this test can be used) for the duration of the COVID-19 declaration under Section 564(b)(1) of the  Act, 21 U.S.C.section 360bbb-3(b)(1), unless the authorization is terminated  or revoked sooner.       Influenza A by PCR NEGATIVE NEGATIVE   Influenza B by PCR NEGATIVE NEGATIVE    Comment: (NOTE) The Xpert Xpress SARS-CoV-2/FLU/RSV plus assay is intended as an aid in the diagnosis of influenza from Nasopharyngeal swab specimens and should not be used as a sole basis for treatment. Nasal washings and aspirates are unacceptable for Xpert Xpress SARS-CoV-2/FLU/RSV testing.  Fact Sheet for Patients: EntrepreneurPulse.com.au  Fact Sheet for Healthcare Providers: IncredibleEmployment.be  This test is not yet approved or cleared by the [place] FDA and has been authorized for detection and/or diagnosis of SARS-CoV-2 by FDA under an Emergency Use Authorization (EUA). This EUA will remain in effect (meaning this test can be used) for the duration of the COVID-19 declaration under Section 564(b)(1) of the Act, 21 U.S.C. section 360bbb-3(b)(1), unless the authorization is terminated or revoked.  Performed at [facility], [address] [place], [place] [address]    CT ABDOMEN PELVIS W CONTRAST  Result Date: 09/19/2020 CLINICAL DATA:  Constipation, bloating.  Bowel obstruction suspected EXAM: CT ABDOMEN AND PELVIS WITH CONTRAST TECHNIQUE: Multidetector CT imaging of the abdomen and pelvis was performed using the standard protocol following bolus administration of intravenous contrast. CONTRAST:  126mL OMNIPAQUE IOHEXOL 300 MG/ML  SOLN COMPARISON:  None. FINDINGS: Lower chest: Right lower lobe nodule on image 8 measures 6 mm. Mild subpleural nodularity in the left lung base. No effusions. Heart is normal size. Hepatobiliary: No focal hepatic abnormality. Gallbladder unremarkable. Pancreas: No focal abnormality or ductal dilatation. Spleen: No focal abnormality.  Normal size. Adrenals/Urinary Tract: No adrenal abnormality. No focal renal  abnormality. No stones or hydronephrosis. Stomach/Bowel: Stomach and small bowel decompressed, unremarkable. There is moderate distention of the colon with stool and gas to the level of the sigmoid colon where there appears to be an annular constricting lesion extending approximately 4 cm in the mid sigmoid colon. The inferior aspect of this annular lesion appears to extend to involve the dome of the bladder. Vascular/Lymphatic: Aortic atherosclerosis. No evidence of aneurysm or adenopathy. Reproductive: Prostate enlargement with  calcifications. Other: No free fluid or free air. Musculoskeletal: No acute bony abnormality. 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 This appears to cause at least partial obstruction with the colon moderately distended with large amount of stool and gas proximal to this lesion. This lesion appears to extend to involve the dome of the bladder. 6 mm right lower lobe pulmonary nodule. Small subpleural nodules in the left lower lung. Given the finding within the sigmoid colon, cannot exclude metastases. Aortic atherosclerosis. Prostate enlargement. These results were called by telephone at the time of interpretation on 09/19/2020 at 1:18 am to provider [facility] , who verbally acknowledged these results. Electronically Signed   By: Rolm Baptise M.D.   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  - CT today with annular lesion 4 cm in length in sigmoid colon concerning for malignancy, lesion also appears to involve the dome of the bladder  - patient very distended on exam and having intermittent pain despite medication, not passing flatus or stool - recommend operative intervention today to relieve obstruction - patient in agreement to proceed - discussed with urology who will be  available to evaluate bladder if needed intra-operatively - discussed with Stanley who will mark prior to OR today  - stat CEA  Pulmonary nodules - noted on CT, metastatic disease can't be excluded   FEN: NPO, IVF VTE: SCDs ID: [email or web address] pre-op  Norm Parcel, [facility] 09/19/2020, 9:24 AM Please see Amion for pager number during day hours 7:00am-4:30pm

## 2020-09-19 NOTE — Op Note (Signed)
Operative Note  Preoperative diagnosis:  1.  Bladder transection/injury  Postoperative diagnosis: 1.  Bladder transection/injury  Procedure(s): 1.  Complex cystorrhaphy  Surgeon: Link Snuffer, MD  Co-surgeon: Mercy Riding, MD  Assistants: Lillia Corporal  Anesthesia: General  Complications: None immediate  EBL: Minimal for my portion  Specimens: 1.  None  Drains/Catheters: 1.  20 French Foley catheter  Intraoperative findings: Approximately 4 cm opening at the bladder dome closed in 3 layers.  No significant leakage from the cystorrhaphy after instillation of about 200 cc of sterile water  Indication: 79 year old male undergoing a distal colon resection for an obstructing mass.  Preoperatively it was noted that the mass may be invading the bladder.  The mass was able to be excised but in the process a portion of the bladder was opened.  Therefore I was consulted intraoperatively.  There was no evidence of any residual tumor on the bladder and all edges appear to be free of tumor.  Description of procedure:  On scrubbing in, the patient was in the supine position under general anesthesia.  He had a midline abdominal incision.  I inspected the bladder and there was an approximately 4 cm cystotomy.  We proceeded to close this with a running 2-0 Vicryl on the mucosa layer followed by 3-0 chromic on the detrusor layer.  I instilled about 150 cc of sterile water into the bladder through the catheter which was prepped on the field and there was a small amount of leakage.  Therefore we ran a third layer of 3-0 chromic.  I instilled 200 cc of sterile water into the bladder and there was no evidence of any leakage.  I exchanged the Foley catheter for a larger 20 French catheter for maximal drainage.  I asked general surgery to leave a drain at the end of the case.  This concluded my portion of the operation.  The case was turned over to general surgery.  Plan: Maintain Foley catheter for  2 weeks.  He will need to undergo a cystogram prior to removal in the office.  We will monitor JP output.

## 2020-09-19 NOTE — Consult Note (Signed)
[place] Nurse ostomy consult note Consult received for creation of new colostomy intraoperatively.  Patient is still in surgery. Will see for first post operative visit and provision of initial ostomy education Monday.  [place] nursing team will follow, and will remain available to this patient, the nursing and medical teams.   Thanks, Maudie Flakes, MSN, RN, [place], Arther Abbott  Pager# [phone]

## 2020-09-20 DIAGNOSIS — C187 Malignant neoplasm of sigmoid colon: Principal | ICD-10-CM

## 2020-09-20 LAB — BASIC METABOLIC PANEL
Anion gap: 11 (ref 5–15)
BUN: 18 mg/dL (ref 8–23)
CO2: 27 mmol/L (ref 22–32)
Calcium: 8.2 mg/dL — ABNORMAL LOW (ref 8.9–10.3)
Chloride: 98 mmol/L (ref 98–111)
Creatinine, Ser: 1.01 mg/dL (ref 0.61–1.24)
GFR, Estimated: >60 mL/min (ref >60)
Glucose, Bld: 153 mg/dL — ABNORMAL HIGH (ref 70–99)
Potassium: 4.1 mmol/L (ref 3.5–5.1)
Sodium: 136 mmol/L (ref 135–145)

## 2020-09-20 LAB — CBC
HCT: 44.8 % (ref 39.0–52.0)
Hemoglobin: 14.9 g/dL (ref 13.0–17.0)
MCH: 31.4 pg (ref 26.0–34.0)
MCHC: 33.3 g/dL (ref 30.0–36.0)
MCV: 94.3 fL (ref 80.0–100.0)
Platelets: 414 10*3/uL — ABNORMAL HIGH (ref 150–400)
RBC: 4.75 MIL/uL (ref 4.22–5.81)
RDW: 13.6 % (ref 11.5–15.5)
WBC: 16.4 10*3/uL — ABNORMAL HIGH (ref 4.0–10.5)
nRBC: 0 % (ref 0.0–0.2)

## 2020-09-20 LAB — CEA: CEA: 63.2 ng/mL — ABNORMAL HIGH (ref 0.0–4.7)

## 2020-09-20 MED ORDER — OXYCODONE HCL 5 MG PO TABS
5.0000 mg | ORAL_TABLET | ORAL | Status: DC | PRN
  Administered 2020-09-20 – 2020-09-23 (×16): 5 mg via ORAL
  Filled 2020-09-20 (×16): qty 1

## 2020-09-20 MED ORDER — LORATADINE 10 MG PO TABS
10.0000 mg | ORAL_TABLET | Freq: Every day | ORAL | Status: DC
  Administered 2020-09-20 – 2020-09-23 (×4): 10 mg via ORAL
  Filled 2020-09-20 (×4): qty 1

## 2020-09-20 NOTE — Progress Notes (Signed)
Patient refuses to ambulate at this time. States that he ambulated several times today and that he wants to rest. Rationale for ambulation and early mobility post surgery explained to patient. He states "Honey, I'll do it tomorrow". Will continue to encourage mobility.

## 2020-09-20 NOTE — Progress Notes (Signed)
1 Day Post-Op   Subjective/Chief Complaint: Comfortable Denies nausea   Objective: Vital signs in last 24 hours: Temp:  [98 F (36.7 C)-99.2 F (37.3 C)] 98 F (36.7 C) (08/13 3299) Pulse Rate:  [70-102] 82 (08/13 0613) Resp:  [14-28] 18 (08/13 0613) BP: (132-176)/(59-91) 134/66 (08/13 0613) SpO2:  [96 %-100 %] 99 % (08/13 0613) Weight:  [93 kg-97.3 kg] 97.3 kg (08/13 0500) Last BM Date: 09/15/20  Intake/Output from previous day: 08/12 0701 - 08/13 0700 In: 2597.2 [I.V.:2347.2; IV Piggyback:250] Out: 2426 [Urine:1000; Drains:235; Blood:50] Intake/Output this shift: No intake/output data recorded.  Exam: Awake and alert Abdomen distended, obese, stool in bag, ostomy dusky Drain serosang  Lab Results:  Recent Labs    09/19/20 1559 09/20/20 0556  WBC 27.5* 16.4*  HGB 15.3 14.9  HCT 46.0 44.8  PLT 445* 414*   BMET Recent Labs    09/19/20 1559 09/20/20 0556  NA 138 136  K 3.7 4.1  CL 99 98  CO2 24 27  GLUCOSE 142* 153*  BUN 15 18  CREATININE 1.06 1.01  CALCIUM 8.6* 8.2*   PT/INR No results for input(s): LABPROT, INR in the last 72 hours. ABG No results for input(s): PHART, HCO3 in the last 72 hours.  Invalid input(s): PCO2, PO2  Studies/Results: CT ABDOMEN PELVIS W CONTRAST  Result Date: 09/19/2020 CLINICAL DATA:  Constipation, bloating.  Bowel obstruction suspected EXAM: CT ABDOMEN AND PELVIS WITH CONTRAST TECHNIQUE: Multidetector CT imaging of the abdomen and pelvis was performed using the standard protocol following bolus administration of intravenous contrast. CONTRAST:  153mL OMNIPAQUE IOHEXOL 300 MG/ML  SOLN COMPARISON:  None. FINDINGS: Lower chest: Right lower lobe nodule on image 8 measures 6 mm. Mild subpleural nodularity in the left lung base. No effusions. Heart is normal size. Hepatobiliary: No focal hepatic abnormality. Gallbladder unremarkable. Pancreas: No focal abnormality or ductal dilatation. Spleen: No focal abnormality.  Normal size.  Adrenals/Urinary Tract: No adrenal abnormality. No focal renal abnormality. No stones or hydronephrosis. Stomach/Bowel: Stomach and small bowel decompressed, unremarkable. There is moderate distention of the colon with stool and gas to the level of the sigmoid colon where there appears to be an annular constricting lesion extending approximately 4 cm in the mid sigmoid colon. The inferior aspect of this annular lesion appears to extend to involve the dome of the bladder. Vascular/Lymphatic: Aortic atherosclerosis. No evidence of aneurysm or adenopathy. Reproductive: Prostate enlargement with calcifications. Other: No free fluid or free air. Musculoskeletal: No acute bony abnormality. 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 This appears to cause at least partial obstruction with the colon moderately distended with large amount of stool and gas proximal to this lesion. This lesion appears to extend to involve the dome of the bladder. 6 mm right lower lobe pulmonary nodule. Small subpleural nodules in the left lower lung. Given the finding within the sigmoid colon, cannot exclude metastases. Aortic atherosclerosis. Prostate enlargement. These results were called by telephone at the time of interpretation on 09/19/2020 at 1:18 am to provider [facility] , who verbally acknowledged these results. Electronically Signed   By: Rolm Baptise M.D.   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  Will stay in 2 weeks and follow up  with urology Start clear liquids Awaiting final path   LOS: 1 day    Craig Best 09/20/2020

## 2020-09-20 NOTE — Progress Notes (Signed)
Progress Note    Craig Best  TWS:[id number] DOB: 04-26-1940  DOA: 09/18/2020 PCP: Pcp, No    Brief Narrative:     Medical records reviewed and are as summarized below:  Al Bracewell is an 80 y.o. male with no significant past medical history  Who presents to ED with complaint of constipation and abdominal pain x 3 days with assc nausea.  He notes that pain felt like gas pain. He tried laxatives at home w/o relief. He states he is able to tolerate po but had nausea , but denies any vomiting.  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  If the patient is discharged, we will arrange this as an outpatient in clinic. -Continue JP to bulb suction for now.  Strict ins and outs  -CT chest ordered in ER and is pending -Dr. Benay Spice consulted   obesity Body mass index is 32.62 kg/m.   Family Communication/Anticipated D/C date and plan/Code Status   DVT prophylaxis: ambulate/SCDs Code Status: Full Code.  Family Communication: at bedside Disposition Plan: Status is: Inpatient  Remains inpatient appropriate because:Inpatient level of care appropriate due to severity of illness  Dispo: The patient is from: Home              Anticipated d/c is to: Home              Patient currently is not medically stable to d/c.   Difficult to place patient No         Medical Consultants:   [facility]     Subjective:   C/o being sore  Objective:    Vitals:   09/19/20 2240 09/20/20 0218  09/20/20 0500 09/20/20 0613  BP: (!) 144/76 140/78  134/66  Pulse: 94 100  82  Resp: 18 18  18   Temp: 98.3 F (36.8 C) 98.4 F (36.9 C)  98 F (36.7 C)  TempSrc: Oral Oral  Oral  SpO2: 98% 97%  99%  Weight:   97.3 kg   Height:        Intake/Output Summary (Last 24 hours) at 09/20/2020 1107 Last data filed at 09/20/2020 0700 Gross per 24 hour  Intake 2597.17 ml  Output 1285 ml  Net 1312.17 ml   Filed Weights   09/18/20 2319 09/19/20 1211 09/20/20 0500  Weight: 93 kg 93 kg 97.3 kg    Exam:  General: Appearance:    Obese male in no acute distress     Lungs:      respirations unlabored  Heart:    Normal heart rate.    MS:   All extremities are intact.    Neurologic:   Awake, alert, oriented x 3. No apparent focal neurological           defect.      Data Reviewed:   I have personally reviewed following labs and imaging studies:  Labs: Labs show the following:   Basic Metabolic Panel: Recent Labs  Lab 09/19/20 0006 09/19/20 1559 09/20/20 0556  NA 136 138 136  K 3.8 3.7 4.1  CL 98 99 98  CO2 26 24 27   GLUCOSE 126* 142* 153*  BUN 14 15 18   CREATININE 1.05 1.06 1.01  CALCIUM 8.9 8.6* 8.2*  MG  --  2.4  --   PHOS  --  3.6  --    GFR Estimated Creatinine Clearance: 69.3 mL/min (by C-G formula based on SCr of 1.01 mg/dL). Liver Function Tests: Recent Labs  Lab 09/19/20 1559  AST 20  ALT 21  ALKPHOS 68  BILITOT 0.9  PROT 7.3  ALBUMIN 3.8   No results for input(s): LIPASE, AMYLASE in the last 168 hours. No results for input(s): AMMONIA in the last 168 hours. Coagulation profile No results for input(s): INR, PROTIME in the last 168 hours.  CBC: Recent Labs  Lab 09/19/20 0006 09/19/20 1559 09/20/20 0556  WBC 13.0* 27.5* 16.4*  NEUTROABS 9.7* 24.8*  --   HGB 16.6 15.3 14.9  HCT 48.1 46.0 44.8  MCV 92.1 94.1 94.3  PLT 395 445* 414*   Cardiac Enzymes: No results for input(s): CKTOTAL, CKMB, CKMBINDEX, TROPONINI in the last 168 hours. BNP (last  3 results) No results for input(s): PROBNP in the last 8760 hours. CBG: No results for input(s): GLUCAP in the last 168 hours. D-Dimer: No results for input(s): DDIMER in the last 72 hours. Hgb A1c: Recent Labs    09/19/20 1559  HGBA1C 5.9*   Lipid Profile: No results for input(s): CHOL, HDL, LDLCALC, TRIG, CHOLHDL, LDLDIRECT in the last 72 hours. Thyroid function studies: Recent Labs    09/19/20 1559  TSH 2.397   Anemia work up: No results for input(s): VITAMINB12, FOLATE, FERRITIN, TIBC, IRON, RETICCTPCT in the last 72 hours. Sepsis Labs: Recent Labs  Lab 09/19/20 0006 09/19/20 1559 09/20/20 0556  WBC 13.0* 27.5* 16.4*    Microbiology Recent Results (from the past 240 hour(s))  Resp Panel by RT-PCR (Flu A&B, Covid) Nasopharyngeal Swab     Status: None   Collection Time: 09/19/20  2:13 AM   Specimen: Nasopharyngeal Swab; Nasopharyngeal(NP) swabs in vial transport medium  Result Value Ref Range Status   SARS Coronavirus 2 by RT PCR NEGATIVE NEGATIVE Final    Comment: (NOTE) SARS-CoV-2 target nucleic acids are NOT DETECTED.  The SARS-CoV-2 RNA is generally detectable in upper respiratory specimens during the acute phase of infection. The lowest concentration of SARS-CoV-2 viral copies this assay can detect is 138 copies/mL. A negative result does not preclude SARS-Cov-2 infection and should not be used as the sole basis for treatment or other patient management decisions. A negative result may occur with  improper specimen collection/handling, submission of specimen other than nasopharyngeal swab, presence of viral mutation(s) within the areas targeted by this assay, and inadequate number of viral copies(<138 copies/mL). A negative result must be combined with clinical observations, patient history, and epidemiological information. The expected result is Negative.  Fact Sheet for Patients:  EntrepreneurPulse.com.au  Fact Sheet for Healthcare  Providers:  IncredibleEmployment.be  This test is no t yet approved or cleared by the [place] FDA and  has been authorized for detection and/or diagnosis of SARS-CoV-2 by FDA under an Emergency Use Authorization (EUA). This EUA will remain  in effect (meaning this test can be used) for the duration of the COVID-19 declaration under Section 564(b)(1) of the Act, 21 U.S.C.section 360bbb-3(b)(1), unless the authorization is terminated  or revoked sooner.       Influenza A by PCR NEGATIVE NEGATIVE Final   Influenza B  by PCR NEGATIVE NEGATIVE Final    Comment: (NOTE) The Xpert Xpress SARS-CoV-2/FLU/RSV plus assay is intended as an aid in the diagnosis of influenza from Nasopharyngeal swab specimens and should not be used as a sole basis for treatment. Nasal washings and aspirates are unacceptable for Xpert Xpress SARS-CoV-2/FLU/RSV testing.  Fact Sheet for Patients: EntrepreneurPulse.com.au  Fact Sheet for Healthcare Providers: IncredibleEmployment.be  This test is not yet approved or cleared by the [place] FDA and has been authorized for detection and/or diagnosis of SARS-CoV-2 by FDA under an Emergency Use Authorization (EUA). This EUA will remain in effect (meaning this test can be used) for the duration of the COVID-19 declaration under Section 564(b)(1) of the Act, 21 U.S.C. section 360bbb-3(b)(1), unless the authorization is terminated or revoked.  Performed at [facility], [address] [place], [place] [address]     Procedures and diagnostic studies:  CT ABDOMEN PELVIS W CONTRAST  Result Date: 09/19/2020 CLINICAL DATA:  Constipation, bloating.  Bowel obstruction suspected EXAM: CT ABDOMEN AND PELVIS WITH CONTRAST TECHNIQUE: Multidetector CT imaging of the abdomen and pelvis was performed using the standard protocol following bolus administration of intravenous contrast. CONTRAST:  126mL  OMNIPAQUE IOHEXOL 300 MG/ML  SOLN COMPARISON:  None. FINDINGS: Lower chest: Right lower lobe nodule on image 8 measures 6 mm. Mild subpleural nodularity in the left lung base. No effusions. Heart is normal size. Hepatobiliary: No focal hepatic abnormality. Gallbladder unremarkable. Pancreas: No focal abnormality or ductal dilatation. Spleen: No focal abnormality.  Normal size. Adrenals/Urinary Tract: No adrenal abnormality. No focal renal abnormality. No stones or hydronephrosis. Stomach/Bowel: Stomach and small bowel decompressed, unremarkable. There is moderate distention of the colon with stool and gas to the level of the sigmoid colon where there appears to be an annular constricting lesion extending approximately 4 cm in the mid sigmoid colon. The inferior aspect of this annular lesion appears to extend to involve the dome of the bladder. Vascular/Lymphatic: Aortic atherosclerosis. No evidence of aneurysm or adenopathy. Reproductive: Prostate enlargement with calcifications. Other: No free fluid or free air. Musculoskeletal: No acute bony abnormality. 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 This appears to cause at least partial obstruction with the colon moderately distended with large amount of stool and gas proximal to this lesion. This lesion appears to extend to involve the dome of the bladder. 6 mm right lower lobe pulmonary nodule. Small subpleural nodules in the left lower lung. Given the finding within the sigmoid colon, cannot exclude metastases. Aortic atherosclerosis. Prostate enlargement. These results were called by telephone at the time of interpretation on 09/19/2020 at 1:18 am to provider [facility] , who verbally acknowledged these results. Electronically Signed   By: Rolm Baptise M.D.   On: 09/19/2020 01:23    Medications:    acetaminophen  1,000 mg Oral Q6H   Chlorhexidine  Gluconate Cloth  6 each Topical Daily   loratadine  10 mg Oral Daily   sodium chloride flush  3 mL Intravenous Q12H   Continuous Infusions:  sodium chloride 75 mL/hr at 09/20/20 0745   methocarbamol (ROBAXIN) IV 500 mg (09/20/20 0526)     LOS: 1 day   Geradine Girt  [facility]   How to contact the [facility] Attending or Consulting provider [place] or covering provider during after hours [place], for this patient?  Check the care team in [facility] and look  for a) attending/consulting [place] provider listed and b) the [facility] team listed Log into [email or web address] and use [place]'s universal password to access. If you do not have the password, please contact the hospital operator. Locate the [facility] provider you are looking for under [facility] and page to a number that you can be directly reached. If you still have difficulty reaching the provider, please page the [facility] (Director on Call) for the Hospitalists listed on amion for assistance.  09/20/2020, 11:07 AM

## 2020-09-20 NOTE — Evaluation (Signed)
Physical Therapy Evaluation Patient Details Name: Craig Best MRN: [id number] DOB: 01-19-1941 Today's Date: 09/20/2020   History of Present Illness  Admitted with complaint of constipation and abdominal pain x 3 days with assc nausea.   He notes that pain felt like gas pain. He tried laxatives at home w/o relief. He states he is able to tolerate po but had nausea , but denies any vomiting.  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    He is s/p partial colectomy, colostomy, and bladder repair/cystorrhapy (09/19/20)  Clinical Impression  Pt admitted with above diagnosis. Pt currently with functional limitations due to the deficits listed below (see PT Problem List). Pt will benefit from skilled PT to increase their independence and safety with mobility to allow discharge to the venue listed below.  Once up, he did well with mobility, but had the most trouble getting in and out of bed.  He was instructed in safe way, but preferred to do it his own way.  Recommend RW.  Lives alone, but will have A from close friends.     Follow Up Recommendations Supervision - Intermittent    Equipment Recommendations  Rolling walker with 5" wheels    Recommendations for Other Services       Precautions / Restrictions Precautions Precaution Comments: colostomy, JP drain      Mobility  Bed Mobility Overal bed mobility: Needs Assistance Bed Mobility: Supine to Sit     Supine to sit: Min assist;+2 for physical assistance     General bed mobility comments: Instructed in proper body mechanics after abdominal surgery and he felt he had more pain with rolling.  He did more of a half roll and then had A to get upright.  For return supine, he sat on high bed, scooted butt back and then had legs lifted in    Transfers Overall transfer level: Needs assistance Equipment used: Rolling walker  (2 wheeled) Transfers: Sit to/from Stand Sit to Stand: From elevated surface;Min guard         General transfer comment: cues for hand placement  Ambulation/Gait Ambulation/Gait assistance: Min guard;Min assist Gait Distance (Feet): 120 Feet Assistive device: Rolling walker (2 wheeled) Gait Pattern/deviations: Decreased step length - right;Decreased step length - left Gait velocity: decreased   General Gait Details: decreased speed and needed slight increase in assistance with turning.  Stairs            Wheelchair Mobility    Modified Rankin (Stroke Patients Only)       Balance Overall balance assessment: Mild deficits observed, not formally tested                                           Pertinent Vitals/Pain Pain Assessment: 0-10 Pain Score: 6  Pain Location: belly Pain Intervention(s): Premedicated before session;Monitored during session;Repositioned    Home Living Family/patient expects to be discharged to:: Private residence Living Arrangements: Alone Available Help at Discharge: Friend(s);Available PRN/intermittently Type of Home: House Home Access: Stairs to enter   Entrance Stairs-Number of Steps: 11 at back, and 4 at the front Home Layout: Laundry or work area in basement;Other (Comment);Two level;Able to live on main level with bedroom/bathroom Home Equipment: None      Prior Function Level of Independence: Independent  Hand Dominance        Extremity/Trunk Assessment   Upper Extremity Assessment Upper Extremity Assessment: Overall WFL for tasks assessed    Lower Extremity Assessment Lower Extremity Assessment: Overall WFL for tasks assessed       Communication   Communication: No difficulties  Cognition Arousal/Alertness: Awake/alert Behavior During Therapy: WFL for tasks assessed/performed Overall Cognitive Status: Within Functional Limits for tasks assessed                                         General Comments      Exercises     Assessment/Plan    PT Assessment Patient needs continued PT services  PT Problem List Decreased activity tolerance;Decreased mobility;Decreased knowledge of use of DME;Decreased knowledge of precautions;Pain       PT Treatment Interventions DME instruction;Gait training;Stair training;Functional mobility training;Therapeutic exercise;Therapeutic activities;Balance training    PT Goals (Current goals can be found in the Care Plan section)  Acute Rehab PT Goals Patient Stated Goal: make it easier to get in and out of bed PT Goal Formulation: With patient/family Time For Goal Achievement: 10/04/20 Potential to Achieve Goals: Good    Frequency Min 3X/week   Barriers to discharge        Co-evaluation               AM-PAC PT "6 Clicks" Mobility  Outcome Measure Help needed turning from your back to your side while in a flat bed without using bedrails?: A Little Help needed moving from lying on your back to sitting on the side of a flat bed without using bedrails?: A Little Help needed moving to and from a bed to a chair (including a wheelchair)?: A Little Help needed standing up from a chair using your arms (e.g., wheelchair or bedside chair)?: A Little Help needed to walk in hospital room?: A Little Help needed climbing 3-5 steps with a railing? : A Lot 6 Click Score: 17    End of Session   Activity Tolerance: Patient tolerated treatment well Patient left: in bed;with call bell/phone within reach;with family/visitor present Nurse Communication: Mobility status PT Visit Diagnosis: Difficulty in walking, not elsewhere classified (R26.2)    Time: 0037-0488 PT Time Calculation (min) (ACUTE ONLY): 37 min   Charges:   PT Evaluation $PT Eval Moderate Complexity: 1 Mod PT Treatments $Therapeutic Activity: 8-22 mins        Santiago Glad L. Tamala Julian, [place]  09/20/2020   Galen Manila 09/20/2020, 2:25 PM

## 2020-09-20 NOTE — Anesthesia Postprocedure Evaluation (Signed)
Anesthesia Post Note  Patient: Paige Vanderwoude  Procedure(s) Performed: PARTIAL COLECTOMY, WITH CREATION OF COLOSTOMY, COMPLEX BLADDER REPAIR (Abdomen)     Patient location during evaluation: PACU Anesthesia Type: General Level of consciousness: awake and alert Pain management: pain level controlled Vital Signs Assessment: post-procedure vital signs reviewed and stable Respiratory status: spontaneous breathing, nonlabored ventilation, respiratory function stable and patient connected to nasal cannula oxygen Cardiovascular status: blood pressure returned to baseline and stable Postop Assessment: no apparent nausea or vomiting Anesthetic complications: no   No notable events documented.  Last Vitals:  Vitals:   09/20/20 0218 09/20/20 0613  BP: 140/78 134/66  Pulse: 100 82  Resp: 18 18  Temp: 36.9 C 36.7 C  SpO2: 97% 99%    Last Pain:  Vitals:   09/20/20 0754  TempSrc:   PainSc: [place] Torri Langston

## 2020-09-20 NOTE — Progress Notes (Addendum)
Urology Progress Note   1 Day Post-Op from partial colectomy, colostomy, bladder repair/cystorrhaphy  Subjective: NAEON.  Vital signs are stable.  1 L of urine output.  235 cc from [place].  Creatinine stable at 1.01.  Hemoglobin stable at 14.9.  Objective: Vital signs in last 24 hours: Temp:  [98 F (36.7 C)-99.2 F (37.3 C)] 98 F (36.7 C) (08/13 7782) Pulse Rate:  [70-102] 82 (08/13 0613) Resp:  [14-28] 18 (08/13 0613) BP: (133-176)/(59-91) 134/66 (08/13 0613) SpO2:  [96 %-100 %] 99 % (08/13 0613) Weight:  [93 kg-97.3 kg] 97.3 kg (08/13 0500)  Intake/Output from previous day: 08/12 0701 - 08/13 0700 In: 2597.2 [I.V.:2347.2; IV Piggyback:250] Out: 4235 [Urine:1000; Drains:235; Blood:50] Intake/Output this shift: No intake/output data recorded.  Physical Exam:  General: Alert and oriented CV: Regular rate Lungs: No increased work of breathing Abdomen: His abdomen is distended.  Soft, appropriately tender. Incisions c/d/i. JP SS GU: Foley in place draining clear yellow urine Ext: NT, No erythema  Lab Results: Recent Labs    09/19/20 0006 09/19/20 1559 09/20/20 0556  HGB 16.6 15.3 14.9  HCT 48.1 46.0 44.8   Recent Labs    09/19/20 1559 09/20/20 0556  NA 138 136  K 3.7 4.1  CL 99 98  CO2 24 27  GLUCOSE 142* 153*  BUN 15 18  CREATININE 1.06 1.01  CALCIUM 8.6* 8.2*    Studies/Results: CT ABDOMEN PELVIS W CONTRAST  Result Date: 09/19/2020 CLINICAL DATA:  Constipation, bloating.  Bowel obstruction suspected EXAM: CT ABDOMEN AND PELVIS WITH CONTRAST TECHNIQUE: Multidetector CT imaging of the abdomen and pelvis was performed using the standard protocol following bolus administration of intravenous contrast. CONTRAST:  167mL OMNIPAQUE IOHEXOL 300 MG/ML  SOLN COMPARISON:  None. FINDINGS: Lower chest: Right lower lobe nodule on image 8 measures 6 mm. Mild subpleural nodularity in the left lung base. No effusions. Heart is normal size. Hepatobiliary: No focal hepatic  abnormality. Gallbladder unremarkable. Pancreas: No focal abnormality or ductal dilatation. Spleen: No focal abnormality.  Normal size. Adrenals/Urinary Tract: No adrenal abnormality. No focal renal abnormality. No stones or hydronephrosis. Stomach/Bowel: Stomach and small bowel decompressed, unremarkable. There is moderate distention of the colon with stool and gas to the level of the sigmoid colon where there appears to be an annular constricting lesion extending approximately 4 cm in the mid sigmoid colon. The inferior aspect of this annular lesion appears to extend to involve the dome of the bladder. Vascular/Lymphatic: Aortic atherosclerosis. No evidence of aneurysm or adenopathy. Reproductive: Prostate enlargement with calcifications. Other: No free fluid or free air. Musculoskeletal: No acute bony abnormality. 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 This appears to cause at least partial obstruction with the colon moderately distended with large amount of stool and gas proximal to this lesion. This lesion appears to extend to involve the dome of the bladder. 6 mm right lower lobe pulmonary nodule. Small subpleural nodules in the left lower lung. Given the finding within the sigmoid colon, cannot exclude metastases. Aortic atherosclerosis. Prostate enlargement. These results were called by telephone at the time of interpretation on 09/19/2020 at 1:18 am to provider [facility] , who verbally acknowledged these results. Electronically Signed   By: Rolm Baptise M.D.   On: 09/19/2020 01:23    Assessment/Plan:  80 y.o. male s/p partial colectomy/colostomy/cystorrhaphy.  Overall improving post-op.   -From urologic standpoint, plan to keep the Foley catheter in  place for 2 weeks with a cystogram prior to trial of void.  If the patient is discharged, we will arrange this as an outpatient in  clinic. -Continue JP to bulb suction for now.  Strict ins and outs -Appreciate general surgery's care for this patient  Dispo: Per primary team   LOS: 1 day

## 2020-09-20 NOTE — Progress Notes (Signed)
Patient ambulated with RN 1/2 length of hallway from end of corridor to nurses station. Tolerated ambulation well.

## 2020-09-21 DIAGNOSIS — K56699 Other intestinal obstruction unspecified as to partial versus complete obstruction: Secondary | ICD-10-CM

## 2020-09-21 LAB — CBC
HCT: 38.8 % — ABNORMAL LOW (ref 39.0–52.0)
Hemoglobin: 12.8 g/dL — ABNORMAL LOW (ref 13.0–17.0)
MCH: 31.6 pg (ref 26.0–34.0)
MCHC: 33 g/dL (ref 30.0–36.0)
MCV: 95.8 fL (ref 80.0–100.0)
Platelets: 241 10*3/uL (ref 150–400)
RBC: 4.05 MIL/uL — ABNORMAL LOW (ref 4.22–5.81)
RDW: 13.5 % (ref 11.5–15.5)
WBC: 13.8 10*3/uL — ABNORMAL HIGH (ref 4.0–10.5)
nRBC: 0 % (ref 0.0–0.2)

## 2020-09-21 MED ORDER — METHOCARBAMOL 500 MG IVPB - SIMPLE MED
500.0000 mg | Freq: Four times a day (QID) | INTRAVENOUS | Status: DC
  Administered 2020-09-21 – 2020-09-22 (×2): 500 mg via INTRAVENOUS
  Filled 2020-09-21: qty 500

## 2020-09-21 NOTE — Progress Notes (Signed)
Progress Note    Craig Best  JKK:[id number] DOB: 12-Apr-1940  DOA: 09/18/2020 PCP: Pcp, No    Brief Narrative:     Medical records reviewed and are as summarized below:  Craig Best is an 80 y.o. male with no significant past medical history  Who presents to ED with complaint of constipation and abdominal pain x 3 days with assc nausea.  He notes that pain felt like gas pain. He tried laxatives at home w/o relief. He states he is able to tolerate po but had nausea , but denies any vomiting.  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  If the patient is discharged, we will arrange this as an outpatient in clinic. -Continue JP to bulb suction for now.  Strict ins and outs -CT chest ordered and is pending -Dr. Benay Spice consulted   obesity Body mass index is 31.03 kg/m.   Family Communication/Anticipated D/C date and plan/Code Status   DVT prophylaxis: ambulate/SCDs Code Status: Full Code.  Family Communication: at bedside Disposition Plan: Status is: Inpatient  Remains inpatient appropriate because:Inpatient level of care appropriate due to severity of illness  Dispo: The patient is from: Home              Anticipated d/c is to: Home              Patient currently is not medically stable to d/c.   Difficult to place patient No    Medical Consultants:   [facility]     Subjective:   No SOB, no CP-- still sore  Objective:    Vitals:   09/20/20 2031 09/21/20 0511  09/21/20 0552 09/21/20 1133  BP: 135/68 128/78  125/64  Pulse: 90 84  76  Resp:  (!) 25  20  Temp: 98.8 F (37.1 C) 98.9 F (37.2 C)  98.1 F (36.7 C)  TempSrc: Oral Oral    SpO2: 97% 97%  96%  Weight:  95.5 kg 92.6 kg   Height:        Intake/Output Summary (Last 24 hours) at 09/21/2020 1401 Last data filed at 09/21/2020 0530 Gross per 24 hour  Intake 882.75 ml  Output 1090 ml  Net -207.25 ml   Filed Weights   09/20/20 0500 09/21/20 0511 09/21/20 0552  Weight: 97.3 kg 95.5 kg 92.6 kg    Exam:  General: Appearance:    Obese male in no acute distress   +BS, ostomy in place  Lungs:     respirations unlabored  Heart:    Normal heart rate.    MS:   All extremities are intact.    Neurologic:   Awake, alert, oriented x 3        Data Reviewed:   I have personally reviewed following labs and imaging studies:  Labs: Labs show the following:   Basic Metabolic Panel: Recent Labs  Lab 09/19/20 0006 09/19/20 1559 09/20/20 0556  NA 136 138 136  K 3.8 3.7 4.1  CL 98 99 98  CO2 26 24 27  GLUCOSE 126* 142* 153*  BUN 14 15 18   CREATININE 1.05 1.06 1.01  CALCIUM 8.9 8.6* 8.2*  MG  --  2.4  --   PHOS  --  3.6  --    GFR Estimated Creatinine Clearance: 67.7 mL/min (by C-G formula based on SCr of 1.01 mg/dL). Liver Function Tests: Recent Labs  Lab 09/19/20 1559  AST 20  ALT 21  ALKPHOS 68  BILITOT 0.9  PROT 7.3  ALBUMIN 3.8   No results for input(s): LIPASE, AMYLASE in the last 168 hours. No results for input(s): AMMONIA in the last 168 hours. Coagulation profile No results for input(s): INR, PROTIME in the last 168 hours.  CBC: Recent Labs  Lab 09/19/20 0006 09/19/20 1559 09/20/20 0556 09/21/20 0509  WBC 13.0* 27.5* 16.4* 13.8*  NEUTROABS 9.7* 24.8*  --   --   HGB 16.6 15.3 14.9 12.8*  HCT 48.1 46.0 44.8 38.8*  MCV 92.1 94.1 94.3 95.8  PLT 395 445* 414* 241   Cardiac Enzymes: No results for input(s): CKTOTAL, CKMB, CKMBINDEX, TROPONINI in the  last 168 hours. BNP (last 3 results) No results for input(s): PROBNP in the last 8760 hours. CBG: No results for input(s): GLUCAP in the last 168 hours. D-Dimer: No results for input(s): DDIMER in the last 72 hours. Hgb A1c: Recent Labs    09/19/20 1559  HGBA1C 5.9*   Lipid Profile: No results for input(s): CHOL, HDL, LDLCALC, TRIG, CHOLHDL, LDLDIRECT in the last 72 hours. Thyroid function studies: Recent Labs    09/19/20 1559  TSH 2.397   Anemia work up: No results for input(s): VITAMINB12, FOLATE, FERRITIN, TIBC, IRON, RETICCTPCT in the last 72 hours. Sepsis Labs: Recent Labs  Lab 09/19/20 0006 09/19/20 1559 09/20/20 0556 09/21/20 0509  WBC 13.0* 27.5* 16.4* 13.8*    Microbiology Recent Results (from the past 240 hour(s))  Resp Panel by RT-PCR (Flu A&B, Covid) Nasopharyngeal Swab     Status: None   Collection Time: 09/19/20  2:13 AM   Specimen: Nasopharyngeal Swab; Nasopharyngeal(NP) swabs in vial transport medium  Result Value Ref Range Status   SARS Coronavirus 2 by RT PCR NEGATIVE NEGATIVE Final    Comment: (NOTE) SARS-CoV-2 target nucleic acids are NOT DETECTED.  The SARS-CoV-2 RNA is generally detectable in upper respiratory specimens during the acute phase of infection. The lowest concentration of SARS-CoV-2 viral copies this assay can detect is 138 copies/mL. A negative result does not preclude SARS-Cov-2 infection and should not be used as the sole basis for treatment or other patient management decisions. A negative result may occur with  improper specimen collection/handling, submission of specimen other than nasopharyngeal swab, presence of viral mutation(s) within the areas targeted by this assay, and inadequate number of viral copies(<138 copies/mL). A negative result must be combined with clinical observations, patient history, and epidemiological information. The expected result is Negative.  Fact Sheet for Patients:   EntrepreneurPulse.com.au  Fact Sheet for Healthcare Providers:  IncredibleEmployment.be  This test is no t yet approved or cleared by the [place] FDA and  has been authorized for detection and/or diagnosis of SARS-CoV-2 by FDA under an Emergency Use Authorization (EUA). This EUA will remain  in effect (meaning this test can be used) for the duration of the COVID-19 declaration under Section 564(b)(1) of the Act, 21 U.S.C.section 360bbb-3(b)(1), unless the authorization is terminated  or revoked sooner.       Influenza A by PCR NEGATIVE NEGATIVE Final   Influenza B by PCR  NEGATIVE NEGATIVE Final    Comment: (NOTE) The Xpert Xpress SARS-CoV-2/FLU/RSV plus assay is intended as an aid in the diagnosis of influenza from Nasopharyngeal swab specimens and should not be used as a sole basis for treatment. Nasal washings and aspirates are unacceptable for Xpert Xpress SARS-CoV-2/FLU/RSV testing.  Fact Sheet for Patients: EntrepreneurPulse.com.au  Fact Sheet for Healthcare Providers: IncredibleEmployment.be  This test is not yet approved or cleared by the [place] FDA and has been authorized for detection and/or diagnosis of SARS-CoV-2 by FDA under an Emergency Use Authorization (EUA). This EUA will remain in effect (meaning this test can be used) for the duration of the COVID-19 declaration under Section 564(b)(1) of the Act, 21 U.S.C. section 360bbb-3(b)(1), unless the authorization is terminated or revoked.  Performed at [facility], [address] [place], [place] [address]     Procedures and diagnostic studies:  No results found.  Medications:    acetaminophen  1,000 mg Oral Q6H   Chlorhexidine Gluconate Cloth  6 each Topical Daily   loratadine  10 mg Oral Daily   sodium chloride flush  3 mL Intravenous Q12H   Continuous Infusions:  methocarbamol (ROBAXIN) IV 500 mg  (09/21/20 1354)     LOS: 2 days   Geradine Girt  [facility]   How to contact the [facility] Attending or Consulting provider [place] or covering provider during after hours [place], for this patient?  Check the care team in [facility] and look for a) attending/consulting TRH provider listed and b) the [facility] team listed Log into [email or web address] and use [place]'s universal password to access. If you do not have the password, please contact the hospital operator. Locate the [facility] provider you are looking for under [facility] and page to a number that you can be directly reached. If you still have difficulty reaching the provider, please page the [facility] (Director on Call) for the Hospitalists listed on amion for assistance.  09/21/2020, 2:01 PM

## 2020-09-21 NOTE — Progress Notes (Addendum)
Urology Progress Note   2 Days Post-Op from partial colectomy, colostomy, bladder repair/cystorrhaphy  Subjective: NAEON.  Vital signs are stable.  500 urine output documented during shift yesterday.  Night shift not documented but making clear yellow urine in the tubing and in the bag this morning.  640 cc from [place].  Creatinine stable at 1.0.  Hemoglobin s 12.8.  White count 13.16. stool from his ostomy.  Objective: Vital signs in last 24 hours: Temp:  [98.1 F (36.7 C)-98.9 F (37.2 C)] 98.9 F (37.2 C) (08/14 0511) Pulse Rate:  [84-90] 84 (08/14 0511) Resp:  [20-25] 25 (08/14 0511) BP: (128-149)/(68-78) 128/78 (08/14 0511) SpO2:  [97 %-100 %] 97 % (08/14 0511) Weight:  [92.6 kg-95.5 kg] 92.6 kg (08/14 0552)  Intake/Output from previous day: 08/13 0701 - 08/14 0700 In: 1122.8 [P.O.:960; I.V.:3; IV Piggyback:159.8] Out: 1140 [Urine:500; Drains:640] Intake/Output this shift: No intake/output data recorded.  Physical Exam:  General: Alert and oriented CV: Regular rate Lungs: No increased work of breathing Abdomen: His abdomen is distended.  Soft, appropriately tender. Incisions c/d/i. JP SS.  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 GU: Foley in place draining clear yellow urine Ext: NT, No erythema  Lab Results: Recent Labs    09/19/20 1559 09/20/20 0556 09/21/20 0509  HGB 15.3 14.9 12.8*  HCT 46.0 44.8 38.8*    Recent Labs    09/19/20 1559 09/20/20 0556  NA 138 136  K 3.7 4.1  CL 99 98  CO2 24 27  GLUCOSE 142* 153*  BUN 15 18  CREATININE 1.06 1.01  CALCIUM 8.6* 8.2*     Studies/Results: No results found.  Assessment/Plan:  80 y.o. male s/p partial colectomy/colostomy/cystorrhaphy.  Overall improving post-op.   -From urologic standpoint, plan to keep the Foley catheter in place for 2 weeks with a cystogram prior to trial of void.  If the patient is discharged, we will arrange this as an outpatient in clinic. -Continue JP to bulb suction for now.  Strict  ins and outs -Appreciate general surgery's care for this patient  Dispo: Per primary team   LOS: 2 days

## 2020-09-21 NOTE — Progress Notes (Signed)
2 Days Post-Op   Subjective/Chief Complaint: Having some nausea Minimal abdominal pain   Objective: Vital signs in last 24 hours: Temp:  [98.1 F (36.7 C)-98.9 F (37.2 C)] 98.9 F (37.2 C) (08/14 0511) Pulse Rate:  [84-90] 84 (08/14 0511) Resp:  [20-25] 25 (08/14 0511) BP: (128-149)/(68-78) 128/78 (08/14 0511) SpO2:  [97 %-100 %] 97 % (08/14 0511) Weight:  [92.6 kg-95.5 kg] 92.6 kg (08/14 0552) Last BM Date: 09/20/20  Intake/Output from previous day: 08/13 0701 - 08/14 0700 In: 1122.8 [P.O.:960; I.V.:3; IV Piggyback:159.8] Out: 1140 [Urine:500; Drains:640] Intake/Output this shift: No intake/output data recorded.  Exam: Awake and alert Abdomen obese, still full, mid line incision clean, drain serosang Ostomy productive with a lot of stool in the bag.  Ostomy still dusky  Lab Results:  Recent Labs    09/20/20 0556 09/21/20 0509  WBC 16.4* 13.8*  HGB 14.9 12.8*  HCT 44.8 38.8*  PLT 414* 241   BMET Recent Labs    09/19/20 1559 09/20/20 0556  NA 138 136  K 3.7 4.1  CL 99 98  CO2 24 27  GLUCOSE 142* 153*  BUN 15 18  CREATININE 1.06 1.01  CALCIUM 8.6* 8.2*   PT/INR No results for input(s): LABPROT, INR in the last 72 hours. ABG No results for input(s): PHART, HCO3 in the last 72 hours.  Invalid input(s): PCO2, PO2  Studies/Results: No results found.  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

## 2020-09-22 ENCOUNTER — Inpatient Hospital Stay (HOSPITAL_COMMUNITY): Payer: Medicare Other

## 2020-09-22 ENCOUNTER — Encounter (HOSPITAL_COMMUNITY): Payer: Self-pay | Admitting: Surgery

## 2020-09-22 MED ORDER — PROCHLORPERAZINE EDISYLATE 10 MG/2ML IJ SOLN: 5.0000 mg | INTRAMUSCULAR | Status: DC | PRN

## 2020-09-22 MED ORDER — HYDROMORPHONE HCL 1 MG/ML IJ SOLN: 0.5000 mg | INTRAMUSCULAR | Status: DC | PRN

## 2020-09-22 MED ORDER — ACETAMINOPHEN 500 MG PO TABS: 1000.0000 mg | ORAL_TABLET | Freq: Four times a day (QID) | ORAL | Status: DC

## 2020-09-22 MED ORDER — DEXTROSE 5 % IV SOLN
1000.0000 mg | Freq: Four times a day (QID) | INTRAVENOUS | Status: DC | PRN
Start: 2020-09-22 — End: 2020-09-22
  Filled 2020-09-22: qty 10

## 2020-09-22 MED ORDER — MAGIC MOUTHWASH
15.0000 mL | Freq: Four times a day (QID) | ORAL | Status: DC | PRN
  Filled 2020-09-22: qty 15

## 2020-09-22 MED ORDER — DIPHENHYDRAMINE HCL 50 MG/ML IJ SOLN: 12.5000 mg | Freq: Four times a day (QID) | INTRAMUSCULAR | Status: DC | PRN

## 2020-09-22 MED ORDER — METHOCARBAMOL 500 MG PO TABS
500.0000 mg | ORAL_TABLET | Freq: Four times a day (QID) | ORAL | Status: DC | PRN
  Administered 2020-09-22 (×2): 500 mg via ORAL
  Filled 2020-09-22 (×2): qty 1

## 2020-09-22 MED ORDER — ENALAPRILAT 1.25 MG/ML IV SOLN
0.6250 mg | Freq: Four times a day (QID) | INTRAVENOUS | Status: DC | PRN
  Filled 2020-09-22: qty 1

## 2020-09-22 MED ORDER — LIP MEDEX EX OINT
1.0000 "application " | TOPICAL_OINTMENT | Freq: Two times a day (BID) | CUTANEOUS | Status: DC
  Administered 2020-09-22: 1 via TOPICAL
  Filled 2020-09-22 (×2): qty 7

## 2020-09-22 MED ORDER — CALCIUM POLYCARBOPHIL 625 MG PO TABS
625.0000 mg | ORAL_TABLET | Freq: Two times a day (BID) | ORAL | Status: DC
  Administered 2020-09-22 – 2020-09-23 (×2): 625 mg via ORAL
  Filled 2020-09-22 (×3): qty 1

## 2020-09-22 MED ORDER — ENOXAPARIN SODIUM 40 MG/0.4ML IJ SOSY
40.0000 mg | PREFILLED_SYRINGE | Freq: Every day | INTRAMUSCULAR | Status: DC
  Administered 2020-09-22 – 2020-09-23 (×2): 40 mg via SUBCUTANEOUS
  Filled 2020-09-22 (×2): qty 0.4

## 2020-09-22 MED ORDER — LACTATED RINGERS IV BOLUS: 1000.0000 mL | Freq: Three times a day (TID) | INTRAVENOUS | Status: DC | PRN

## 2020-09-22 NOTE — Progress Notes (Signed)
This nurse acknowledged new orders, spoke to on call Dr. To verify.

## 2020-09-22 NOTE — Progress Notes (Signed)
Progress Note    Craig Best  CZY:[id number] DOB: 05/21/1940  DOA: 09/18/2020 PCP: Pcp, No    Brief Narrative:     Medical records reviewed and are as summarized below:  Kristoph Sattler is an 80 y.o. male with no significant past medical history  Who presents to ED with complaint of constipation and abdominal pain x 3 days with assc nausea.  He notes that pain felt like gas pain. He tried laxatives at home w/o relief. He states he is able to tolerate po but had nausea , but denies any vomiting.  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  If the patient is discharged, we will arrange this as an outpatient in clinic. -Continue JP to bulb suction for now- ? If this can be d/c'd -CT chest ordered and is pending -Dr. Benay Spice consulted and will see in AM if path back  obesity Body mass index is 31.03 kg/m.   Family Communication/Anticipated D/C date and plan/Code Status   DVT prophylaxis: ambulate/SCDs Code Status: Full Code.  Family Communication: at bedside Disposition Plan: Status is: Inpatient  Remains inpatient appropriate because:Inpatient level of care appropriate due to severity of illness  Dispo: The patient is from: Home              Anticipated d/c is to: Home Patient currently is not medically stable to d/c.- home in AM if ok with GS and oncology   Difficult to place patient No    Medical Consultants:   [facility]     Subjective:   Asking about  going home  Objective:    Vitals:   09/21/20 0511 09/21/20 0552 09/21/20 1133 09/21/20 2100  BP: 128/78  125/64 (!) 137/59  Pulse: 84  76 75  Resp: (!) 25  20 18   Temp: 98.9 F (37.2 C)  98.1 F (36.7 C) 98.6 F (37 C)  TempSrc: Oral   Oral  SpO2: 97%  96% 97%  Weight: 95.5 kg 92.6 kg    Height:        Intake/Output Summary (Last 24 hours) at 09/22/2020 1313 Last data filed at 09/22/2020 0932 Gross per 24 hour  Intake 483 ml  Output 2615 ml  Net -2132 ml   Filed Weights   09/20/20 0500 09/21/20 0511 09/21/20 0552  Weight: 97.3 kg 95.5 kg 92.6 kg    Exam:   General: Appearance:    Obese male in no acute distress     Lungs:     respirations unlabored  Heart:    Normal heart rate. Normal rhythm. No murmurs, rubs, or gallops.    MS:   All extremities are intact.    Neurologic:   Awake, alert, oriented x 3. No apparent focal neurological           defect.          Data Reviewed:   I have personally reviewed following labs and imaging studies:  Labs: Labs show the following:   Basic Metabolic Panel: Recent Labs  Lab 09/19/20 0006 09/19/20 1559 09/20/20 0556  NA 136 138 136  K 3.8 3.7 4.1  CL 98 99 98  CO2 26 24 27   GLUCOSE 126* 142* 153*  BUN 14 15 18   CREATININE 1.05 1.06 1.01  CALCIUM 8.9 8.6* 8.2*  MG  --  2.4  --   PHOS  --  3.6  --    GFR Estimated Creatinine Clearance: 67.7 mL/min (by C-G formula based on SCr of 1.01 mg/dL). Liver Function Tests: Recent Labs  Lab 09/19/20 1559  AST 20  ALT 21  ALKPHOS 68  BILITOT 0.9  PROT 7.3  ALBUMIN 3.8   No results for input(s): LIPASE, AMYLASE in the last 168 hours. No results for input(s): AMMONIA in the last 168 hours. Coagulation profile No results for input(s): INR, PROTIME in the last 168 hours.  CBC: Recent Labs  Lab 09/19/20 0006 09/19/20 1559 09/20/20 0556 09/21/20 0509  WBC 13.0* 27.5* 16.4* 13.8*  NEUTROABS 9.7* 24.8*  --   --   HGB 16.6 15.3 14.9 12.8*  HCT 48.1 46.0 44.8  38.8*  MCV 92.1 94.1 94.3 95.8  PLT 395 445* 414* 241   Cardiac Enzymes: No results for input(s): CKTOTAL, CKMB, CKMBINDEX, TROPONINI in the last 168 hours. BNP (last 3 results) No results for input(s): PROBNP in the last 8760 hours. CBG: No results for input(s): GLUCAP in the last 168 hours. D-Dimer: No results for input(s): DDIMER in the last 72 hours. Hgb A1c: Recent Labs    09/19/20 1559  HGBA1C 5.9*   Lipid Profile: No results for input(s): CHOL, HDL, LDLCALC, TRIG, CHOLHDL, LDLDIRECT in the last 72 hours. Thyroid function studies: Recent Labs    09/19/20 1559  TSH 2.397   Anemia work up: No results for input(s): VITAMINB12, FOLATE, FERRITIN, TIBC, IRON, RETICCTPCT in the last 72 hours. Sepsis Labs: Recent Labs  Lab 09/19/20 0006 09/19/20 1559 09/20/20 0556 09/21/20 0509  WBC 13.0* 27.5* 16.4* 13.8*    Microbiology Recent Results (from the past 240 hour(s))  Resp Panel by RT-PCR (Flu A&B, Covid) Nasopharyngeal Swab     Status: None   Collection Time: 09/19/20  2:13 AM   Specimen: Nasopharyngeal Swab; Nasopharyngeal(NP) swabs in vial transport medium  Result Value Ref Range Status   SARS Coronavirus 2 by RT PCR NEGATIVE NEGATIVE Final    Comment: (NOTE) SARS-CoV-2 target nucleic acids are NOT DETECTED.  The SARS-CoV-2 RNA is generally detectable in upper respiratory specimens during the acute phase of infection. The lowest concentration of SARS-CoV-2 viral copies this assay can detect is 138 copies/mL. A negative result does not preclude SARS-Cov-2 infection and should not be used as the sole basis for treatment or other patient management decisions. A negative result may occur with  improper specimen collection/handling, submission of specimen other than nasopharyngeal swab, presence of viral mutation(s) within the areas targeted by this assay, and inadequate number of viral copies(<138 copies/mL). A negative result must be combined with clinical  observations, patient history, and epidemiological information. The expected result is Negative.  Fact Sheet for Patients:  EntrepreneurPulse.com.au  Fact Sheet for Healthcare Providers:  IncredibleEmployment.be  This test is no t yet approved or cleared by the [place] FDA and  has been authorized for detection and/or diagnosis of SARS-CoV-2 by FDA under an Emergency Use Authorization (EUA). This EUA will remain  in effect (meaning this test can be used) for the duration of the COVID-19 declaration under Section 564(b)(1) of the Act, 21 U.S.C.section 360bbb-3(b)(1),  unless the authorization is terminated  or revoked sooner.       Influenza A by PCR NEGATIVE NEGATIVE Final   Influenza B by PCR NEGATIVE NEGATIVE Final    Comment: (NOTE) The Xpert Xpress SARS-CoV-2/FLU/RSV plus assay is intended as an aid in the diagnosis of influenza from Nasopharyngeal swab specimens and should not be used as a sole basis for treatment. Nasal washings and aspirates are unacceptable for Xpert Xpress SARS-CoV-2/FLU/RSV testing.  Fact Sheet for Patients: EntrepreneurPulse.com.au  Fact Sheet for Healthcare Providers: IncredibleEmployment.be  This test is not yet approved or cleared by the [place] FDA and has been authorized for detection and/or diagnosis of SARS-CoV-2 by FDA under an Emergency Use Authorization (EUA). This EUA will remain in effect (meaning this test can be used) for the duration of the COVID-19 declaration under Section 564(b)(1) of the Act, 21 U.S.C. section 360bbb-3(b)(1), unless the authorization is terminated or revoked.  Performed at [facility], [address] [place], [place] [address]     Procedures and diagnostic studies:   Medications:    acetaminophen  1,000 mg Oral Q6H   Chlorhexidine Gluconate Cloth  6 each Topical Daily   enoxaparin (LOVENOX) injection  40 mg  Subcutaneous Daily   loratadine  10 mg Oral Daily   sodium chloride flush  3 mL Intravenous Q12H   Continuous Infusions:     LOS: 3 days   Geradine Girt  [facility]   How to contact the [facility] Attending or Consulting provider [place] or covering provider during after hours [place], for this patient?  Check the care team in [facility] and look for a) attending/consulting TRH provider listed and b) the [facility] team listed Log into [email or web address] and use [place]'s universal password to access. If you do not have the password, please contact the hospital operator. Locate the [facility] provider you are looking for under [facility] and page to a number that you can be directly reached. If you still have difficulty reaching the provider, please page the [facility] (Director on Call) for the Hospitalists listed on amion for assistance.  09/22/2020, 1:13 PM

## 2020-09-22 NOTE — Progress Notes (Signed)
Physical Therapy Treatment Patient Details Name: Craig Best MRN: [id number] DOB: 09-21-1940 Today's Date: 09/22/2020    History of Present Illness Admitted with complaint of constipation and abdominal pain x 3 days with assc nausea.   He notes that pain felt like gas pain. He tried laxatives at home w/o relief. He states he is able to tolerate po but had nausea , but denies any vomiting.  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    He is s/p partial colectomy, colostomy, and bladder repair/cystorrhapy (09/19/20)    PT Comments    Pt tolerated exiting bed with log roll technique to left side today.  Pt ambulated in hallway and steady with RW.  Pt encouraged to ambulate again today with family.    Follow Up Recommendations  Supervision - Intermittent     Equipment Recommendations  Rolling walker with 5" wheels    Recommendations for Other Services       Precautions / Restrictions Precautions Precautions: Other (comment) Precaution Comments: colostomy, JP drain    Mobility  Bed Mobility Overal bed mobility: Needs Assistance Bed Mobility: Rolling;Sidelying to Sit Rolling: Min guard Sidelying to sit: Min guard       General bed mobility comments: tactile cues for log roll technique, pt with better ability to tolerate with transferring to left side, pt left sitting EOB per request with family in room    Transfers Overall transfer level: Needs assistance Equipment used: Rolling walker (2 wheeled) Transfers: Sit to/from Stand Sit to Stand: Min guard         General transfer comment: cues for safe technique  Ambulation/Gait Ambulation/Gait assistance: Min guard;Supervision Gait Distance (Feet): 600 Feet Assistive device: Rolling walker (2 wheeled) Gait Pattern/deviations: Step-through pattern;Decreased stride length     General Gait Details: slow  pace but steady with RW, pt reports feels good to walk   Chief Strategy Officer    Modified Rankin (Stroke Patients Only)       Balance                                            Cognition Arousal/Alertness: Awake/alert Behavior During Therapy: WFL for tasks assessed/performed Overall Cognitive Status: Within Functional Limits for tasks assessed                                        Exercises      General Comments        Pertinent Vitals/Pain Pain Assessment: 0-10 Pain Score: 5  Pain Location: abdomen Pain Descriptors / Indicators: Sore Pain Intervention(s): Repositioned;Monitored during session    Home Living                      Prior Function            PT Goals (current goals can now be found in the care plan section) Progress towards PT goals: Progressing toward goals    Frequency    Min 3X/week      PT Plan Current plan remains appropriate    Co-evaluation  AM-PAC PT "6 Clicks" Mobility   Outcome Measure  Help needed turning from your back to your side while in a flat bed without using bedrails?: A Little Help needed moving from lying on your back to sitting on the side of a flat bed without using bedrails?: A Little Help needed moving to and from a bed to a chair (including a wheelchair)?: A Little Help needed standing up from a chair using your arms (e.g., wheelchair or bedside chair)?: A Little Help needed to walk in hospital room?: A Little Help needed climbing 3-5 steps with a railing? : A Little 6 Click Score: 18    End of Session   Activity Tolerance: Patient tolerated treatment well Patient left: in bed;with call bell/phone within reach;with bed alarm set;with family/visitor present Nurse Communication: Mobility status PT Visit Diagnosis: Difficulty in walking, not elsewhere classified (R26.2)     Time: 6438-3818 PT Time Calculation (min)  (ACUTE ONLY): 17 min  Jannette Spanner PT, DPT Acute Rehabilitation Services Pager: [phone] Office: [phone]    Kharisma Glasner,KATHrine E 09/22/2020, 3:00 PM

## 2020-09-22 NOTE — Plan of Care (Signed)
  Problem: Education: Goal: Knowledge of General Education information will improve Description: Including pain rating scale, medication(s)/side effects and non-pharmacologic comfort measures 09/22/2020 2331 by Jackolyn Confer, RN Outcome: Progressing   Problem: Health Behavior/Discharge Planning: Goal: Ability to manage health-related needs will improve 09/22/2020 2331 by Jackolyn Confer, RN Outcome: Progressing   Problem: Clinical Measurements: Goal: Ability to maintain clinical measurements within normal limits will improve 09/22/2020 2331 by Jackolyn Confer, RN Outcome: Progressing   Problem: Clinical Measurements: Goal: Will remain free from infection 09/22/2020 2331 by Jackolyn Confer, RN Outcome: Progressing   Problem: Clinical Measurements: Goal: Diagnostic test results will improve 09/22/2020 2331 by Jackolyn Confer, RN Outcome: Progressing   Problem: Clinical Measurements: Goal: Respiratory complications will improve 09/22/2020 2331 by Jackolyn Confer, RN Outcome: Progressing   Problem: Clinical Measurements: Goal: Cardiovascular complication will be avoided 09/22/2020 2331 by Jackolyn Confer, RN Outcome: Progressing   Problem: Activity: Goal: Risk for activity intolerance will decrease 09/22/2020 2331 by Jackolyn Confer, RN Outcome: Progressing   Problem: Nutrition: Goal: Adequate nutrition will be maintained 09/22/2020 2331 by Jackolyn Confer, RN Outcome: Progressing   Problem: Coping: Goal: Level of anxiety will decrease 09/22/2020 2331 by Jackolyn Confer, RN Outcome: Progressing   Problem: Elimination: Goal: Will not experience complications related to bowel motility 09/22/2020 2331 by Jackolyn Confer, RN Outcome: Progressing   Problem: Elimination: Goal: Will not experience complications related to urinary retention 09/22/2020 2331 by Jackolyn Confer, RN Outcome: Progressing   Problem: Pain Managment: Goal: General experience of  comfort will improve 09/22/2020 2331 by Jackolyn Confer, RN Outcome: Progressing   Problem: Safety: Goal: Ability to remain free from injury will improve 09/22/2020 2331 by Jackolyn Confer, RN Outcome: Progressing   Problem: Skin Integrity: Goal: Risk for impaired skin integrity will decrease 09/22/2020 2331 by Jackolyn Confer, RN Outcome: Progressing

## 2020-09-22 NOTE — Consult Note (Signed)
[place] Nurse ostomy follow up Stoma type/location: LLQ, end colostomy Stomal assessment/size: 1 3/4" x 2" irregular shape, 95% black stoma/5% pink Peristomal assessment: intact Treatment options for stomal/peristomal skin: 2" skin barrier ring Output thick brown stool; large amount Ostomy pouching: 2pc. 2 3/4" with 2" skin barrier ring Education provided with patient and his daughter in Sports coach; wife is NT here at [employer] role of ostomy nurse and creation of stoma  Explained stoma characteristics (budded, flush, color, texture, care) Demonstrated pouch change (cutting new skin barrier, measuring stoma, cleaning peristomal skin and stoma, use of barrier ring) Education on emptying when 1/3 to 1/2 full and how to empty Dicussed using wick to clean spout of pouch and rationale for this.  Daughter in law engaged to learn, patient not planning to learn it appears he will be relying on wife and others. 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   Enrolled patient in [place] Start Discharge program: Yes  [place] Nurse will follow along with you for continued support with ostomy teaching and care [place] MSN, [place], [place], [place], [place]

## 2020-09-22 NOTE — Progress Notes (Signed)
3 Days Post-Op   Subjective/Chief Complaint: Nausea improving.  Tolerating CLD.  Ostomy working well.   Objective: Vital signs in last 24 hours: Temp:  [98.6 F (37 C)] 98.6 F (37 C) (08/14 2100) Pulse Rate:  [75] 75 (08/14 2100) Resp:  [18] 18 (08/14 2100) BP: (137)/(59) 137/59 (08/14 2100) SpO2:  [97 %] 97 % (08/14 2100) Last BM Date: 09/21/20  Intake/Output from previous day: 08/14 0701 - 08/15 0700 In: 1803 [P.O.:1800; I.V.:3] Out: 2865 [Urine:2325; Drains:15; Stool:525] Intake/Output this shift: Total I/O In: -  Out: 350 [Urine:350]  Exam: Abd: soft, appropriately tender, midline incision is c/d/I with staples intact.  No evidence of infection.  Ostomy pouch just changed with some stool starting to come out of os.  Stoma is dark and necrotic superficially.  JP drain was serousang  Lab Results:  Recent Labs    09/20/20 0556 09/21/20 0509  WBC 16.4* 13.8*  HGB 14.9 12.8*  HCT 44.8 38.8*  PLT 414* 241   BMET Recent Labs    09/19/20 1559 09/20/20 0556  NA 138 136  K 3.7 4.1  CL 99 98  CO2 24 27  GLUCOSE 142* 153*  BUN 15 18  CREATININE 1.06 1.01  CALCIUM 8.6* 8.2*   PT/INR No results for input(s): LABPROT, INR in the last 72 hours. ABG No results for input(s): PHART, HCO3 in the last 72 hours.  Invalid input(s): PCO2, PO2  Studies/Results: No results found.  Anti-infectives: Anti-infectives (From admission, onward)    Start     Dose/Rate Route Frequency Ordered Stop   09/19/20 1200  cefoTEtan (CEFOTAN) 2 g in sodium chloride 0.9 % 100 mL IVPB        2 g 200 mL/hr over 30 Minutes Intravenous On call 09/19/20 0924 09/19/20 1343       Assessment/Plan: POD 3, s/p Hartman's procedure and complex bladder repair secondary to obstructing colon mass -path pending -ostomy working -adv to [place] -WOC following -JP can likely DC prior to DC home -mobilize, pulm toilet -appreciate Dr. Benay Spice seeing the patient -CT chest pending for staging work  up -foley to stay in for 2 weeks and follow up with Dr. Gloriann Loan as outpatient  FEN: FLD VTE: Lovenox ID: none  Henreitta Cea 09/22/2020

## 2020-09-23 ENCOUNTER — Telehealth: Payer: Self-pay | Admitting: Oncology

## 2020-09-23 ENCOUNTER — Encounter (HOSPITAL_COMMUNITY): Payer: Self-pay | Admitting: Internal Medicine

## 2020-09-23 MED ORDER — OXYCODONE HCL 5 MG PO TABS: 5.0000 mg | ORAL_TABLET | ORAL | 0 refills | Status: DC | PRN

## 2020-09-23 MED ORDER — METHOCARBAMOL 500 MG PO TABS: 500.0000 mg | ORAL_TABLET | Freq: Four times a day (QID) | ORAL | 0 refills | Status: DC | PRN

## 2020-09-23 MED ORDER — ACETAMINOPHEN 500 MG PO TABS: 1000.0000 mg | ORAL_TABLET | Freq: Four times a day (QID) | ORAL | 0 refills | Status: AC

## 2020-09-23 MED ORDER — CALCIUM POLYCARBOPHIL 625 MG PO TABS: 625.0000 mg | ORAL_TABLET | Freq: Two times a day (BID) | ORAL | 0 refills | Status: DC

## 2020-09-23 NOTE — Progress Notes (Signed)
JP drain removed.  Foley and leg bag teaching completed with teach back.  VSS.  Went over AVS with patient and family.  All questions answered.

## 2020-09-23 NOTE — Consult Note (Addendum)
[place] Nurse ostomy consult note Stoma type/location: Pt for possible discharge soon; performed pouch change with pt and wife.  She is a Chartered certified accountant at [employer] and states she is very familiar with ostomy pouch application and emptying. Pt watched the procedure and asked appropriate questions and assisted.  Stoma is 95% black, 5% red, 30% flush with skin level, 70% slightly above skin level. 1 3/4 inches and slightly oval. Intact skin surrounding Mod amt semiformed brown stool in the pouch. Wife applied barrier ring and 2 piece pouch. (2pc. 2 3/4" with 2" skin barrier ring) Pt was able to open and close to empty.  Reviewed ordering supplies and pouching routines.  5 sets of wafers/pouches/barrier rings at the bedside along with educational materials.  Enrolled patient in [place] program: Yes; previously. Julien Girt MSN, RN, [place], [place], [place]

## 2020-09-23 NOTE — Progress Notes (Signed)
I personally saw the patient and performed a substantive portion of this encounter, including a complete performance of at least one of the key components (MDM, Hx and/or Exam), in conjunction with the Advanced Practice Provider Earnstine Regal, PA-C.   Patient feeling better.  Walking.  Tolerating liquids.  Agree with advancing to solid diet.  Need outpatient care for ostomy.  Skin staples can come out around 10 days postop.  We will see if he can have a nursing visit to follow-up in our office next week for that.  Plan to have follow-up cystogram and probable Foley removal 2 weeks postop after bladder repair per Dr. Bell/[facility].  8/29 week.  I was able to call and discussed with Dr. Gloriann Loan.  His office will arrange follow-up.  Because the drain output has tapered off and creatinine normal, he feels comfortable with removing the drain at this point.  Follow-up on pathology.  Most likely consents at tumor board.  In theory could consider colostomy takedown in about 6 months if margins negative and prognosis pretty good.  Can discuss that as an outpatient.  Craig Hector, Craig Best, FACS, MASCRS Esophageal, Gastrointestinal & Colorectal Surgery Robotic and Minimally Invasive Surgery  [facility], [place]  [place]. [address], [place], [place] [address] [phone] Fax [phone] Main  CONTACT INFORMATION:  Weekday (9AM-5PM): Call [facility] main office at [phone]  Weeknight (5PM-9AM) or Weekend/Holiday: Check [email or web address] (password " [email or web address]") for General Surgery [facility] coverage  (Please, do not use SecureChat as it is not reliable communication to operating surgeons for immediate patient care)

## 2020-09-23 NOTE — Discharge Instructions (Signed)
Ostomy Support Information  You've heard that people get along just fine with only one of their eyes, or one of their lungs, or one of their kidneys. But you also know that you have only one intestine and only one bladder, and that leaves you feeling awfully empty, both physically and emotionally: You think no other people go around without part of their intestine with the ends of their intestines sticking out through their abdominal walls.   YOU ARE NOT ALONE.  There are nearly three quarters of a million people in the [place] who have an ostomy; people who have had surgery to remove all or part of their colons or bladders.   There is even a national association, the [place] [employer] [place] with over 350 local affiliated support groups that are organized by volunteers who provide peer support and counseling. [employer] has a toll free telephone num-ber, [phone] and an educational, interactive website, www.ostomy.org   An ostomy is an opening in the belly (abdominal wall) made by surgery. Ostomates are people who have had this procedure. The opening (stoma) allows the kidney or bowel to discharge waste. An external pouch covers the stoma to collect waste. Pouches are are a simple bag and are odor free. Different companies have disposable or reusable pouches to fit one's lifestyle. An ostomy can either be temporary or permanent.   THERE ARE THREE MAIN TYPES OF OSTOMIES Colostomy. A colostomy is a surgically created opening in the large intestine (colon). Ileostomy. An ileostomy is a surgically created opening in the small intestine. Urostomy. A urostomy is a surgically created opening to divert urine away from the bladder.  OSTOMY Care  The following guidelines will make care of your colostomy easier. Keep this information close by for quick reference.  Helpful DIET hints Eat a well-balanced diet including vegetables and fresh fruits. Eat on a regular schedule.  Drink at least 6 to 8  glasses of fluids daily. Eat slowly in a relaxed atmosphere. Chew your food thoroughly. Avoid chewing gum, smoking, and drinking from a straw. This will help decrease the amount of air you swallow, which may help reduce gas. Eating yogurt or drinking buttermilk may help reduce gas.  To control gas at night, do not eat after 8 p.m. This will give your bowel time to quiet down before you go to bed.  If gas is a problem, you can purchase Beano. Sprinkle Beano on the first bite of food before eating to reduce gas. It has no flavor and should not change the taste of your food. You can buy Beano over the counter at your local drugstore.  Foods like fish, onions, garlic, broccoli, asparagus, and cabbage produce odor. Although your pouch is odor-proof, if you eat these foods you may notice a stronger odor when emptying your pouch. If this is a concern, you may want to limit these foods in your diet.  If you have an ileostomy, you will have chronic diarrhea & need to drink more liquids to avoid getting dehydrated.  Consider antidiarrheal medicine like imodium (loperamide) or Lomotil to help slow down bowel movements / diarrhea into your ileostomy bag.  GETTING TO GOOD BOWEL HEALTH WITH AN ILEOSTOMY    With the colon bypassed & not in use, you will have small bowel diarrhea.   It is important to thicken & slow your bowel movements down.   The goal: 4-6 small BOWEL MOVEMENTS A DAY It is important to drink plenty of liquids to avoid getting dehydrated  CONTROLLING ILEOSTOMY DIARRHEA  TAKE A FIBER SUPPLEMENT (FiberCon or Benefiner soluble fiber) twice a day - to thicken stools by absorbing excess fluid and retrain the intestines to act more normally.  Slowly increase the dose over a few weeks.  Too much fiber too soon can backfire and cause cramping & bloating.  TAKE AN IRON SUPPLEMENT twice a day to naturally constipate your bowels.  Usually ferrous sulfate 371m twice a day)  TAKE ANTI-DIARRHEAL  MEDICINES: Loperamide (Imodium) can slow down diarrhea.  Start with two tablets (= 449m first and then try one tablet every 6 hours.  Can go up to 2 pills four times day (8 pills of 75m90max) Avoid if you are having fevers or severe pain.  If you are not better or start feeling worse, stop all medicines and call your doctor for advice LoMotil (Diphenoxylate / Atropine) is another medicine that can constipate & slow down bowel moevements Pepto Bismol (bismuth) can gently thicken bowels as well  If diarrhea is worse,: drink plenty of liquids and try simpler foods for a few days to avoid stressing your intestines further. Avoid dairy products (especially milk & ice cream) for a short time.  The intestines often can lose the ability to digest lactose when stressed. Avoid foods that cause gassiness or bloating.  Typical foods include beans and other legumes, cabbage, broccoli, and dairy foods.  Every person has some sensitivity to other foods, so listen to our body and avoid those foods that trigger problems for you.Call your doctor if you are getting worse or not better.  Sometimes further testing (cultures, endoscopy, X-ray studies, bloodwork, etc) may be needed to help diagnose and treat the cause of the diarrhea. Take extra anti-diarrheal medicines (maximum is 8 pills of 75mg59mperamide a day)   Tips for POUCHING an OSTOMY   Changing Your Pouch The best time to change your pouch is in the morning, before eating or drinking anything. Your stoma can function at any time, but it will function more after eating or drinking.   Applying the pouching system  Place all your equipment close at hand before removing your pouch.  Wash your hands.  Stand or sit in front of a mirror. Use the position that works best for you. Remember that you must keep the skin around the stoma wrinkle-free for a good seal.  Gently remove the used pouch (1-piece system) or the pouch and old wafer (2-piece system). Empty  the pouch into the toilet. Save the closure clip to use again.  Wash the stoma itself and the skin around the stoma. Your stoma may bleed a little when being washed. This is normal. Rinse and pat dry. You may use a wash cloth or soft paper towels (like Bounty), mild soap (like Dial, Safeguard, or IvorMongoliand water. Avoid soaps that contain perfumes or lotions.  For a new pouch (1-piece system) or a new wafer (2-piece system), measure your stoma using the stoma guide in each box of supplies.  Trace the shape of your stoma onto the back of the new pouch or the back of the new wafer. Cut out the opening. Remove the paper backing and set it aside.  Optional: Apply a skin barrier powder to surrounding skin if it is irritated (bare or weeping), and dust off the excess. Optional: Apply a skin-prep wipe (such as Skin Prep or All-Kare) to the skin around the stoma, and let it dry. Do not apply this solution if the skin is irritated (  red, tender, or broken) or if you have shaved around the stoma. Optional: Apply a skin barrier paste (such as Stomahesive, Coloplast, or Premium) around the opening cut in the back of the pouch or wafer. Allow it to dry for 30 to 60 seconds.  Hold the pouch (1-piece system) or wafer (2-piece system) with the sticky side toward your body. Make sure the skin around the stoma is wrinkle-free. Center the opening on the stoma, then press firmly to your abdomen (Fig. 4). Look in the mirror to check if you are placing the pouch, or wafer, in the right position. For a 2-piece system, snap the pouch onto the wafer. Make sure it snaps into place securely.  Place your hand over the stoma and the pouch or wafer for about 30 seconds. The heat from your hand can help the pouch or wafer stick to your skin.  Add deodorant (such as Super Banish or Nullo) to your pouch. Other options include food extracts such as vanilla oil and peppermint extract. Add about 10 drops of the deodorant to the pouch.  Then apply the closure clamp. Note: Do not use toxic  chemicals or commercial cleaning agents in your pouch. These substances may harm the stoma.  Optional: For extra seal, apply tape to all 4 sides around the pouch or wafer, as if you were framing a picture. You may use any brand of medical adhesive tape. Change your pouch every 5 to 7 days. Change it immediately if a leak occurs.  Wash your hands afterwards.  If you are wearing a 2-piece system, you may use 2 new pouches per week and alternate them. Rinse the pouch with mild soap and warm water and hang it to dry for the next day. Apply the fresh pouch. Alternate the 2 pouches like this for a week. After a week, change the wafer and begin with 2 new pouches. Place the old pouches in a plastic bag, and put them in the trash.   LIVING WITH AN OSTOMY  Emptying Your Pouch Empty your pouch when it is one-third full (of urine, stool, and/or gas). If you wait until your pouch is fuller than this, it will be more difficult to empty and more noticeable. When you empty your pouch, either put toilet paper in the toilet bowl first, or flush the toilet while you empty the pouch. This will reduce splashing. You can empty the pouch between your legs or to one side while sitting, or while standing or stooping. If you have a 2-piece system, you can snap off the pouch to empty it. Remember that your stoma may function during this time. If you wish to rinse your pouch after you empty it, a [place] baster can be helpful. When using a baster, squirt water up into the pouch through the opening at the bottom. With a 2-piece system, you can snap off the pouch to rinse it. After rinsing  your pouch, empty it into the toilet. When rinsing your pouch at home, put a few granules of Dreft soap in the rinse water. This helps lubricate and freshen your pouch. The inside of your pouch can be sprayed with non-stick cooking oil (Pam spray). This may help reduce stool sticking to  the inside of the pouch.  Bathing You may shower or bathe with your pouch on or off. Remember that your stoma may function during this time.  The materials you use to wash your stoma and the skin around it should be clean, but they  do not need to be sterile.  Wearing Your Pouch During hot weather, or if you perspire a lot in general, wear a cover over your pouch. This may prevent a rash on your skin under the pouch. Pouch covers are sold at ostomy supply stores. Wear the pouch inside your underwear for better support. Watch your weight. Any gain or loss of 10 to 15 pounds or more can change the way your pouch fits.  Going Away From Home A collapsible cup (like those that come in travel kits) or a soft plastic squirt bottle with a pull-up top (like a travel bottle for shampoo) can be used for rinsing your pouch when you are away from home. Tilt the opening of the pouch at an upward angle when using a cup to rinse.  Carry wet wipes or extra tissues to use in public bathrooms.  Carry an extra pouching system with you at all times.  Never keep ostomy supplies in the glove compartment of your car. Extreme heat or cold can damage the skin barriers and adhesive wafers on the pouch.  When you travel, carry your ostomy supplies with you at all times. Keep them within easy reach. Do not pack ostomy supplies in baggage that will be checked or otherwise separated from you, because your baggage might be lost. If you're traveling out of the country, it is helpful to have a letter stating that you are carrying ostomy supplies as a medical necessity.  If you need ostomy supplies while traveling, look in the yellow pages of the telephone book under "Surgical Supplies." Or call the local ostomy organization to find out where supplies are available.  Do not let your ostomy supplies get low. Always order new pouches before you use the last one.  Reducing Odor Limit foods such as broccoli, cabbage, onions,  fish, and garlic in your diet to help reduce odor. Each time you empty your pouch, carefully clean the opening of the pouch, both inside and outside, with toilet paper. Rinse your pouch 1 or 2 times daily after you empty it (see directions for emptying your pouch and going away from home). Add deodorant (such as Super Banish or Nullo) to your pouch. Use air deodorizers in your bathroom. Do not add aspirin to your pouch. Even though aspirin can help prevent odor, it could cause ulcers on your stoma.  When to call the doctor Call the doctor if you have any of the following symptoms: Purple, black, or white stoma Severe cramps lasting more than 6 hours Severe watery discharge from the stoma lasting more than 6 hours No output from the colostomy for 3 days Excessive bleeding from your stoma Swelling of your stoma to more than 1/2-inch larger than usual Pulling inward of your stoma below skin level Severe skin irritation or deep ulcers Bulging or other changes in your abdomen  When to call your ostomy nurse Call your ostomy/enterostomal therapy (WOCN) nurse if any of the following occurs: Frequent leaking of your pouching system Change in size or appearance of your stoma, causing discomfort or problems with your pouch Skin rash or rawness Weight gain or loss that causes problems with your pouch     FREQUENTLY ASKED QUESTIONS   Why haven't you met any of these folks who have an ostomy?  Well, maybe you have! You just did not recognize them because an ostomy doesn't show. It can be kept secret if you wish. Why, maybe some of your best friends, office associates or neighbors  have an ostomy ... you never can tell. People facing ostomy surgery have many quality-of-life questions like: Will you bulge? Smell? Make noises? Will you feel waste leaving your body? Will you be a captive of the toilet? Will you starve? Be a social outcast? Get/stay married? Have babies? Easily bathe, go swimming, bend  over?  OK, let's look at what you can expect:   Will you bulge?  Remember, without part of the intestine or bladder, and its contents, you should have a flatter tummy than before. You can expect to wear, with little exception, what you wore before surgery ... and this in-cludes tight clothing and bathing suits.   Will you smell?  Today, thanks to modern odor proof pouching systems, you can walk into an ostomy support group meeting and not smell anything that is foul or offensive. And, for those with an ileostomy or colostomy who are concerned about odor when emptying their pouch, there are in-pouch deodorants that can be used to eliminate any waste odors that may exist.   Will you make noises?  Everyone produces gas, especially if they are an air-swallower. But intestinal sounds that occur from time to time are no differ-ent than a gurgling tummy, and quite often your clothing will muffle any sounds.   Will you feel the waste discharges?  For those with a colostomy or ileostomy there might be a slight pressure when waste leaves your body, but understand that the intestines have no nerve endings, so there will be no unpleasant sensations. Those with a urostomy will probably be unaware of any kidney drainage.   Will you be a captive of the toilet?  Immediately post-op you will spend more time in the bathroom than you will after your body recovers from surgery. Every person is different, but on average those with an ileostomy or urostomy may empty their pouches 4 to 6 times a day; a little  less if you have a colostomy. The average wear time between pouch system changes is 3 to 5 days and the changing process should take less than 30 minutes.   Will I need to be on a special diet? Most people return to their normal diet when they have recovered from surgery. Be sure to chew your food well, eat a well-balanced diet and drink plenty of fluids. If you experience problems with a certain food, wait a  couple of weeks and try it again.  Will there be odor and noises? Pouching systems are designed to be odor-proof or odor-resistant. There are deodorants that can be used in the pouch. Medications are also available to help reduce odor. Limit gas-producing foods and carbonated beverages. You will experience less gas and fewer noises as you heal from surgery.  How much time will it take to care for my ostomy? At first, you may spend a lot of time learning about your ostomy and how to take care of it. As you become more comfortable and skilled at changing the pouching system, it will take very little time to care for it.   Will I be able to return to work? People with ostomies can perform most jobs. As soon as you have healed from surgery, you should be able to return to work. Heavy lifting (more than 10 pounds) may be discouraged.   What about intimacy? Sexual relationships and intimacy are important and fulfilling aspects of your life. They should continue after ostomy surgery. Intimacy-related concerns should be discussed openly between you and your partner.  Can I wear regular clothing? You do not need to wear special clothing. Ostomy pouches are fairly flat and barely noticeable. Elastic undergarments will not hurt the stoma or prevent the ostomy from functioning.   Can I participate in sports? An ostomy should not limit your involvement in sports. Many people with ostomies are runners, skiers, swimmers or participate in other active lifestyles. Talk with your caregiver first before doing heavy physical activity.  Will you starve?  Not if you follow doctor's orders at each stage of your post-op adjustment. There is no such thing as an "ostomy diet". Some people with an ostomy will be able to eat and tolerate anything; others may find diffi-culty with some foods. Each person is an individual and must determine, by trial, what is best for them. A good practice for all is to drink plenty of  water.   Will you be a social outcast?  Have you met anyone who has an ostomy and is a social outcast? Why should you be the first? Only your attitude and self image will effect how you are treated. No confi-dent person is an Occupational psychologist.    PROFESSIONAL HELP   Resources are available if you need help or have questions about your ostomy.   Specially trained nurses called Wound, Ostomy Continence Nurses (WOCN) are available for consultation in most major medical centers.  Consider getting an ostomy consult at one of the outpatient ostomy clinics.    The [employer] ([employer]OA) is a group made up of many local chapters throughout the [place]. These local groups hold meetings and provide support to prospective and existing ostomates. They sponsor educational events and have qualified visitors to make personal or telephone visits. Contact the [employer]OA for the chapter nearest you and for other educational publications.  More detailed information can be found in Colostomy Guide, a publication of the [employer] (UOA). Contact UOA at 1-(778)034-3706 or visit their web site at https://arellano.com/. The website contains links to other sites, suppliers and resources.  Tree surgeon Start Services: Start at the website to enlist for support.  Your Wound Ostomy (WOCN) nurse may have started this process. https://www.hollister.com/en/securestart Secure Start services are designed to support people as they live their lives with an ostomy or neurogenic bladder. Enrolling is easy and at no cost to the patient. We realize that each person's needs and life journey are different. Through Secure Start services, we want to help people live their life, their way.    ####################################

## 2020-09-23 NOTE — TOC Transition Note (Signed)
Transition of Care [facility]) - CM/SW Discharge Note   Patient Details  Name: Craig Best MRN: [id number] Date of Birth: 12-27-1940  [facility] CM/SW Contact:  Dessa Phi, RN Phone Number: 09/23/2020, 3:08 PM   Clinical Narrative: spoke to patient/defers to Marie(friend) about d/c plans-agree to d/c home-Marie has healthcare experience-comfortable providing any instruction on wound care which WOC has already given. RW to be delivered to rm prior d/c-[employer] rep Zach aware. No further CM needs.            Patient Goals and CMS Choice        Discharge Placement                       Discharge Plan and Services                                     Social Determinants of Health (SDOH) Interventions     Readmission Risk Interventions No flowsheet data found.

## 2020-09-23 NOTE — Discharge Summary (Signed)
Physician Discharge Summary  Craig Best QJJ:[id number] DOB: 10/26/1940 DOA: 09/18/2020  PCP: Merryl Hacker, No  Admit date: 09/18/2020 Discharge date: 09/23/2020  Admitted From: home Discharge disposition: home   Recommendations for Outpatient Follow-Up:   keep the Foley catheter in place for 2 weeks with a cystogram prior to trial of void.- urology follow up Referral to oncology- appointment made Skin staples can come out around 10 days postop   Discharge Diagnosis:   Active Problems:   Partial bowel obstruction (HCC)   Bowel obstruction ([place])    Discharge Condition: Improved.  Diet recommendation: Low sodium, heart healthy.  Wound care: None.  Code status: Full.   History of Present Illness:    Craig Best is a 80 y.o. male with no significant past medical history  Who presents to ED with complaint of constipation and abdominal pain x 3 days with assc nausea.  He notes that pain felt like gas pain. He tried laxatives at home w/o relief. He states he is able to tolerate po but had nausea , but denies any vomiting.  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 Surgery was consulted who recommended admission to medicine service with surgical consultation. Patient hospital course complicated by continued pain and distention.  Patient was then urgently  taking to OR for surgical intervention with partial colectomy and end colostomy. Patient currently s/p surgery, noted he feels much improved, no sob, chest pain , nausea. , notes appropriate surgical pain with movement but otherwise feels well. He states that the only medical history his has was asthma as a small child which he outgrew and note only seasonal allergies that he take over the counter allergy medicine for. 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 He noted no family history of CAD , CVA, DMII or cancer and noted his father had kidney problems when he was in his 60's. He denies any tobacco  or ETOH use any prior surgery.   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  If the patient is discharged, we will arrange this as an outpatient in clinic. 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 -Dr. Benay Spice consulted and will see outpatient   obesity Body mass index is 31.03 kg/m.    Medical Consultants:   [facility]   Discharge Exam:   Vitals:   09/22/20 1358 09/23/20 0509  BP: 130/67 140/69  Pulse: 80 64  Resp:  18  Temp:  98.5 F (36.9 C)  SpO2: 96% 95%   Vitals:   09/21/20 2100 09/22/20 1358 09/23/20 0417 09/23/20 0509  BP: (!) 137/59 130/67  140/69  Pulse: 75 80  64  Resp: 18   18  Temp: 98.6 F (37 C)   98.5 F (36.9 C)  TempSrc: Oral   Oral  SpO2: 97% 96%  95%  Weight:   93.4 kg   Height:        General exam: Appears calm and comfortable.     The results of  significant diagnostics from this hospitalization (including imaging, microbiology, ancillary and laboratory) are listed below for reference.     Procedures and Diagnostic Studies:   CT ABDOMEN PELVIS W CONTRAST  Result Date: 09/19/2020 CLINICAL DATA:  Constipation, bloating.  Bowel obstruction suspected EXAM: CT ABDOMEN AND PELVIS WITH CONTRAST TECHNIQUE: Multidetector CT imaging of the abdomen and pelvis was performed using the standard protocol following bolus administration of intravenous contrast. CONTRAST:  126mL OMNIPAQUE IOHEXOL 300 MG/ML  SOLN COMPARISON:  None. FINDINGS: Lower chest:  Right lower lobe nodule on image 8 measures 6 mm. Mild subpleural nodularity in the left lung base. No effusions. Heart is normal size. Hepatobiliary: No focal hepatic abnormality. Gallbladder unremarkable. Pancreas: No focal abnormality or ductal dilatation. Spleen: No focal abnormality.  Normal size. Adrenals/Urinary Tract: No adrenal abnormality. No focal renal abnormality. No stones or hydronephrosis. Stomach/Bowel: Stomach and small bowel decompressed, unremarkable. There is moderate distention of the colon with stool and gas to the level of the sigmoid colon where there appears to be an annular constricting lesion extending approximately 4 cm in the mid sigmoid colon. The inferior aspect of this annular lesion appears to extend to involve the dome of the bladder. Vascular/Lymphatic: Aortic atherosclerosis. No evidence of aneurysm or adenopathy. Reproductive: Prostate enlargement with calcifications. Other: No free fluid or free air. Musculoskeletal: No acute bony abnormality. 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 This appears to cause at least partial obstruction with the colon moderately distended with large amount of stool and gas proximal to this lesion. This lesion appears to extend to involve the dome of the bladder. 6 mm right lower lobe pulmonary nodule. Small subpleural nodules in the left lower lung. Given the finding within the sigmoid colon, cannot exclude metastases. Aortic atherosclerosis. Prostate enlargement. These results were called by telephone at the time of interpretation on 09/19/2020 at 1:18 am to provider [facility] , who verbally acknowledged these results. Electronically Signed   By: Rolm Baptise M.D.   On: 09/19/2020 01:23     Labs:   Basic Metabolic Panel: Recent Labs  Lab 09/19/20 0006 09/19/20 1559 09/20/20 0556  NA 136 138 136  K 3.8 3.7 4.1  CL 98 99 98   CO2 26 24 27   GLUCOSE 126* 142* 153*  BUN 14 15 18   CREATININE 1.05 1.06 1.01  CALCIUM 8.9 8.6* 8.2*  MG  --  2.4  --   PHOS  --  3.6  --    GFR Estimated Creatinine Clearance: 67.9 mL/min (by C-G formula based on SCr of 1.01 mg/dL). Liver Function Tests: Recent Labs  Lab 09/19/20 1559  AST 20  ALT 21  ALKPHOS 68  BILITOT 0.9  PROT 7.3  ALBUMIN 3.8   No results for input(s): LIPASE, AMYLASE in the last 168 hours. No results for input(s): AMMONIA in the last 168 hours. Coagulation profile No results for input(s): INR, PROTIME in the last 168 hours.  CBC: Recent Labs  Lab 09/19/20 0006 09/19/20 1559 09/20/20 0556 09/21/20 0509  WBC 13.0* 27.5* 16.4* 13.8*  NEUTROABS 9.7* 24.8*  --   --   HGB 16.6 15.3 14.9 12.8*  HCT 48.1 46.0 44.8 38.8*  MCV 92.1 94.1 94.3 95.8  PLT 395 445* 414* 241   Cardiac Enzymes: No results for input(s): CKTOTAL, CKMB, CKMBINDEX, TROPONINI in the last 168 hours. BNP: Invalid input(s): POCBNP CBG: No results for input(s): GLUCAP in the last  168 hours. D-Dimer No results for input(s): DDIMER in the last 72 hours. Hgb A1c No results for input(s): HGBA1C in the last 72 hours. Lipid Profile No results for input(s): CHOL, HDL, LDLCALC, TRIG, CHOLHDL, LDLDIRECT in the last 72 hours. Thyroid function studies No results for input(s): TSH, T4TOTAL, T3FREE, THYROIDAB in the last 72 hours.  Invalid input(s): FREET3 Anemia work up No results for input(s): VITAMINB12, FOLATE, FERRITIN, TIBC, IRON, RETICCTPCT in the last 72 hours. Microbiology Recent Results (from the past 240 hour(s))  Resp Panel by RT-PCR (Flu A&B, Covid) Nasopharyngeal Swab     Status: None   Collection Time: 09/19/20  2:13 AM   Specimen: Nasopharyngeal Swab; Nasopharyngeal(NP) swabs in vial transport medium  Result Value Ref Range Status   SARS Coronavirus 2 by RT PCR NEGATIVE NEGATIVE Final    Comment: (NOTE) SARS-CoV-2 target nucleic acids are NOT DETECTED.  The  SARS-CoV-2 RNA is generally detectable in upper respiratory specimens during the acute phase of infection. The lowest concentration of SARS-CoV-2 viral copies this assay can detect is 138 copies/mL. A negative result does not preclude SARS-Cov-2 infection and should not be used as the sole basis for treatment or other patient management decisions. A negative result may occur with  improper specimen collection/handling, submission of specimen other than nasopharyngeal swab, presence of viral mutation(s) within the areas targeted by this assay, and inadequate number of viral copies(<138 copies/mL). A negative result must be combined with clinical observations, patient history, and epidemiological information. The expected result is Negative.  Fact Sheet for Patients:  EntrepreneurPulse.com.au  Fact Sheet for Healthcare Providers:  IncredibleEmployment.be  This test is no t yet approved or cleared by the [place] FDA and  has been authorized for detection and/or diagnosis of SARS-CoV-2 by FDA under an Emergency Use Authorization (EUA). This EUA will remain  in effect (meaning this test can be used) for the duration of the COVID-19 declaration under Section 564(b)(1) of the Act, 21 U.S.C.section 360bbb-3(b)(1), unless the authorization is terminated  or revoked sooner.       Influenza A by PCR NEGATIVE NEGATIVE Final   Influenza B by PCR NEGATIVE NEGATIVE Final    Comment: (NOTE) The Xpert Xpress SARS-CoV-2/FLU/RSV plus assay is intended as an aid in the diagnosis of influenza from Nasopharyngeal swab specimens and should not be used as a sole basis for treatment. Nasal washings and aspirates are unacceptable for Xpert Xpress SARS-CoV-2/FLU/RSV testing.  Fact Sheet for Patients: EntrepreneurPulse.com.au  Fact Sheet for Healthcare Providers: IncredibleEmployment.be  This test is not yet approved or  cleared by the [place] FDA and has been authorized for detection and/or diagnosis of SARS-CoV-2 by FDA under an Emergency Use Authorization (EUA). This EUA will remain in effect (meaning this test can be used) for the duration of the COVID-19 declaration under Section 564(b)(1) of the Act, 21 U.S.C. section 360bbb-3(b)(1), unless the authorization is terminated or revoked.  Performed at [facility], [address] [place], [place] [address]      Discharge Instructions:   Discharge Instructions     Diet - low sodium heart healthy   Complete by: As directed    Discharge instructions   Complete by: As directed    Plan to have follow-up cystogram and probable Foley removal 2 weeks postop after bladder repair per Dr. Bell/[facility]   Discharge wound care:   Complete by: As directed    Skin staples can come out around 10 days postop.  We  will see if he can have a nursing visit to follow-up in our office next week for that   Increase activity slowly   Complete by: As directed       Allergies as of 09/23/2020   No Known Allergies      Medication List     TAKE these medications    acetaminophen 500 MG tablet Commonly known as: TYLENOL Take 2 tablets (1,000 mg total) by mouth every 6 (six) hours. What changed:  how much to take when to take this reasons to take this   loratadine 10 MG tablet Commonly known as: CLARITIN Take 10 mg by mouth daily.   methocarbamol 500 MG tablet Commonly known as: ROBAXIN Take 1 tablet (500 mg total) by mouth every 6 (six) hours as needed for muscle spasms.   oxyCODONE 5 MG immediate release tablet Commonly known as: Oxy IR/ROXICODONE Take 1 tablet (5 mg total) by mouth every 4 (four) hours as needed for moderate pain.   polycarbophil 625 MG tablet Commonly known as: FIBERCON Take 1 tablet (625 mg total) by mouth 2 (two) times daily.               Discharge Care Instructions  (From admission,  onward)           Start     Ordered   09/23/20 0000  Discharge wound care:       Comments: Skin staples can come out around 10 days postop.  We will see if he can have a nursing visit to follow-up in our office next week for that   09/23/20 1437            Follow-up Information     Gloriann Loan Desiree Hane, MD. Schedule an appointment as soon as possible for a visit in 10 day(s).   Specialty: Urology Why: To have your Foley bladder catheter removed, To follow up after your operation Contact information: [place] [id number] [facility], [place]. Schedule an appointment as soon as possible for a visit on 10/06/2020.   Specialty: General Surgery Why: Your appointment is at 2 PM.  Be at the office 30 minutes early for check in.  Bring photo ID and insurance information.  The nursing staff will remove your staples on this visit. Contact information: [place] [phone]        Ladell Pier, MD Follow up in 2 week(s).   Specialty: Oncology Why: 8/23 at 1:40 pm. Contact information: [place] [address] [phone]         Coralie Keens, MD Follow up on 10/22/2020.   Specialty: General Surgery Why: Your appointment is at 9 AM.  Be at the office 30 minutes early for check in.  Bring photo ID and insurance information. Contact information: [place] [address] [phone]                  Time coordinating discharge: 35 min  Signed:  Geradine Girt DO  [facility] 09/23/2020, 2:39 PM

## 2020-09-23 NOTE — Telephone Encounter (Signed)
Called patient room at [facility] - spoke with patient and his emergency contact Lelan Pons . They are both aware of appointment on 8/23 at 1:40 pm. Confirmed appt date and time and knows to come in 30 mins early for new patient appt.

## 2020-09-23 NOTE — Progress Notes (Signed)
4 Days Post-Op    CC: Abdominal pain, distention, constipation  Subjective: Patient sitting up in bed and tolerating full liquids.  His ostomy bag is full of stool with a good deal of solid consistency.  He has had his bag changed but he does not feel like he is proficient with changing the ostomy.  His wife is a Marine scientist and she does have some proficiency.  Notes from yesterday shows that patient did not seem interested in learning ostomy care was going to depend on his wife and daughter.  I recommended he spend some time learning to take care of himself.  His wife says he is ambulating well, and pain control is with oral agents.  Objective: Vital signs in last 24 hours: Temp:  [98.5 F (36.9 C)] 98.5 F (36.9 C) (08/16 0509) Pulse Rate:  [64-80] 64 (08/16 0509) Resp:  [18] 18 (08/16 0509) BP: (130-140)/(67-69) 140/69 (08/16 0509) SpO2:  [95 %-96 %] 95 % (08/16 0509) Weight:  [93.4 kg] 93.4 kg (08/16 0417) Last BM Date: 09/22/20 1320 p.o. 2375 urine 51 stool Afebrile vital signs are stable Labs WBC 13.8  Intake/Output from previous day: 08/15 0701 - 08/16 0700 In: 9562 [P.O.:1320; I.V.:3] Out: 2426 [Urine:2375; Stool:51] Intake/Output this shift: No intake/output data recorded.  General appearance: alert, cooperative, and no distress Resp: clear to auscultation bilaterally and moving up to 1800 on the I-S. GI: Patient has a large abdomen the ostomy bag is full of stool primary solid thick stool.  I do not really see the ostomy itself because it is covered with stool.  Positive bowel sounds.  His midline incision looks okay got some old crusted blood along it.  JP drain is serosanguineous.  There is probably about 50 cc in the drain right now.  Lab Results:  Recent Labs    09/21/20 0509  WBC 13.8*  HGB 12.8*  HCT 38.8*  PLT 241    BMET No results for input(s): NA, K, CL, CO2, GLUCOSE, BUN, CREATININE, CALCIUM in the last 72 hours. PT/INR No results for input(s): LABPROT,  INR in the last 72 hours.  Recent Labs  Lab 09/19/20 1559  AST 20  ALT 21  ALKPHOS 68  BILITOT 0.9  PROT 7.3  ALBUMIN 3.8     Lipase  No results found for: LIPASE   Medications:  acetaminophen  1,000 mg Oral Q6H   Chlorhexidine Gluconate Cloth  6 each Topical Daily   enoxaparin (LOVENOX) injection  40 mg Subcutaneous Daily   lip balm  1 application Topical BID   loratadine  10 mg Oral Daily   polycarbophil  625 mg Oral BID   sodium chloride flush  3 mL Intravenous Q12H    Assessment/Plan Colon obstruction POD 4 s/p Hartman's procedure and complex bladder repair secondary to obstructing colon mass 09/19/2020 DR. Coralie Keens -path pending -ostomy working -adv to [facility] following - JP can likely DC prior to DC home - mobilize, pulm toilet - appreciate Dr. Benay Spice seeing the patient - CT chest pending for staging work up -foley to stay in for 2 weeks and follow up with Dr. Gloriann Loan as outpatient   FEN: FLD VTE: Lovenox ID: none  Plan: PT is recommended a rolling walker.  He needs a little more time to learn how to care for his ostomy himself.  We will advancing to a soft diet today.  If he continues to do well we will aim for discharge tomorrow.  I will obtain follow-up with  with our office.  We will also need follow-up with Dr. Link Snuffer for his Foley and complex bladder repair.  Dr. Richardo Priest last recommendation on 09/21/20 was that the JP drain stay in place for now.  Will check on that before removing.  I put in a request for TOC to see the patient and begin making arrangements for home care.      LOS: 4 days    Craig Best 09/23/2020 Please see Amion

## 2020-09-23 NOTE — Consult Note (Addendum)
[place]  Telephone:(336) [phone] [phone]   [place]  Referral MD: Dr. Eulogio Bear  Reason for Referral: Obstructing colon mass  HPI: Mr. Fowles is a 80 year old male with no significant past medical history.  He presented to the emergency department with 3-day history of constipation, abdominal pain, and nausea.  He reported gas-like pains.  He tried laxatives at home without relief.  He was tolerating p.o. but had nausea without vomiting.  Admission lab work showed a WBC of 13.0, ANC 9.7, stool for occult blood negative.  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  The lesion appears to extend to involve the dome of the bladder.  There was also a 6 mm right lower lobe pulmonary nodule and small subpleural nodules in the left lower lung concerning for metastases.  He was taken to the OR on 09/19/2020 and general surgery performed a partial colectomy with end colostomy.  Urology performed a complex cystorrhaphy.  The tumor was sent to pathology and results are currently pending.  As part of his staging work-up, he had a CT of the chest without contrast performed which showed multiple pulmonary nodules noted bilaterally consistent with metastatic disease.  He had a CEA performed on 09/19/2020 which was elevated at 63.2.  I met with Mr. Brumbach and his significant other is hospital room.  He reports that he is feeling better at this time.  He has minimal abdominal discomfort.  He reports that prior to admission he noted increased fatigue/weakness for about 1 week.  He was not having any issues with decrease in appetite or weight loss.  He had abdominal pain, nausea,  constipation x3 days.  He did experience some vomiting when he was in the emergency department.  He has not had any headaches, dizziness, chest pain, shortness of breath, melena, hematochezia.  He has never had a colonoscopy.  The patient is single but lives with his significant other.  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  Denies history of alcohol and tobacco use.  He reports that he had 1 sister that had cancer, but he is not sure what type of cancer she had.  She died in her 66s.  Denies any other family history of cancer.  Medical oncology was asked to see the patient to make recommendations regarding his obstructing colon mass and concern for malignancy.  History reviewed. No pertinent past medical history.:   Past Surgical History:  Procedure Laterality Date   PARTIAL COLECTOMY N/A 09/19/2020   Procedure: PARTIAL COLECTOMY, WITH CREATION OF COLOSTOMY, COMPLEX BLADDER REPAIR;  Surgeon: Coralie Keens, MD;  Location: [facility];  Service: General;  Laterality: N/A;  :   Current Facility-Administered Medications  Medication Dose Route Frequency Provider Last Rate Last Admin   acetaminophen (TYLENOL) tablet 1,000 mg  1,000 mg Oral Q6H Saverio Danker, PA-C   1,000 mg at 09/23/20 0409   Chlorhexidine Gluconate Cloth 2 % PADS 6 each  6 each Topical Daily Mcarthur Rossetti, MD   6 each at 09/22/20 0841   diphenhydrAMINE (BENADRYL) injection 12.5-25 mg  12.5-25 mg Intravenous Q6H PRN Michael Boston, MD       enalaprilat (VASOTEC) injection 0.625-1.25 mg  0.625-1.25 mg  Intravenous Q6H PRN Michael Boston, MD       enoxaparin (LOVENOX) injection 40 mg  40 mg Subcutaneous Daily Saverio Danker, PA-C   40 mg at 09/23/20 1856   lactated ringers bolus 1,000 mL  1,000 mL Intravenous Q8H PRN Michael Boston, MD       lip balm (CARMEX) ointment 1 application  1 application Topical BID Michael Boston, MD   1 application at 31/49/70 1657   loratadine (CLARITIN) tablet 10 mg  10 mg Oral Daily Eulogio Bear U,  DO   10 mg at 09/23/20 2637   magic mouthwash  15 mL Oral QID PRN Michael Boston, MD       methocarbamol (ROBAXIN) tablet 500 mg  500 mg Oral Q6H PRN Eulogio Bear U, DO   500 mg at 09/22/20 1652   ondansetron (ZOFRAN) injection 4 mg  4 mg Intravenous Q6H PRN Saverio Danker, PA-C       oxyCODONE (Oxy IR/ROXICODONE) immediate release tablet 5 mg  5 mg Oral Q4H PRN Eulogio Bear U, DO   5 mg at 09/23/20 0852   polycarbophil (FIBERCON) tablet 625 mg  625 mg Oral BID Michael Boston, MD   625 mg at 09/23/20 8588   prochlorperazine (COMPAZINE) injection 5-10 mg  5-10 mg Intravenous Q4H PRN Michael Boston, MD       sodium chloride flush (NS) 0.9 % injection 3 mL  3 mL Intravenous Q12H Saverio Danker, PA-C   3 mL at 09/22/20 2130     No Known Allergies:  History reviewed. No pertinent family history.:   Social History   Socioeconomic History   Marital status: Single    Spouse name: Not on file   Number of children: Not on file   Years of education: Not on file   Highest education level: Not on file  Occupational History   Not on file  Tobacco Use   Smoking status: Never    Passive exposure: Never   Smokeless tobacco: Never  Substance and Sexual Activity   Alcohol use: Not Currently   Drug use: Not Currently   Sexual activity: Not on file  Other Topics Concern   Not on file  Social History Narrative   Not on file   Social Determinants of Health   Financial Resource Strain: Not on file  Food Insecurity: Not on file  Transportation Needs: Not on file  Physical Activity: Not on file  Stress: Not on file  Social Connections: Not on file  Intimate Partner Violence: Not on file  :  Review of Systems: A comprehensive 14 point review of systems was negative except as noted in the HPI.  Exam: Patient Vitals for the past 24 hrs:  BP Temp Temp src Pulse Resp SpO2 Weight  09/23/20 0509 140/69 98.5 F (36.9 C) Oral 64 18 95 % --  09/23/20 0417 -- -- -- -- -- -- 93.4 kg  09/22/20 1358  130/67 -- -- 80 -- 96 % --    General:  well-nourished in no acute distress.   Eyes:  no scleral icterus.   ENT:  There were no oropharyngeal lesions.     Lymphatics:  Negative cervical, supraclavicular or axillary adenopathy.   Respiratory: lungs were clear bilaterally without wheezing or crackles.   Cardiovascular:  Regular rate and rhythm, S1/S2, without murmur, rub or gallop.  There was no pedal edema.   GI: Positive bowel sounds, soft, mild tenderness with palpation.  Ostomy bag in left lower abdomen with soft brown stool.  Staples intact.  JP drain in place. Musculoskeletal: Strength symmetrical in the upper and lower extremities. Skin exam was without echymosis, petichae.   Neuro exam was nonfocal. Patient was alert and oriented.  Attention was good.   Language was appropriate.  Mood was normal without depression.  Speech was not pressured.  Thought content was not tangential.     Lab Results  Component Value Date   WBC 13.8 (H) 09/21/2020   HGB 12.8 (L) 09/21/2020   HCT 38.8 (L) 09/21/2020   PLT 241 09/21/2020   GLUCOSE 153 (H) 09/20/2020   ALT 21 09/19/2020   AST 20 09/19/2020   NA 136 09/20/2020   K 4.1 09/20/2020   CL 98 09/20/2020   CREATININE 1.01 09/20/2020   BUN 18 09/20/2020   CO2 27 09/20/2020    CT CHEST WO CONTRAST  Result Date: 09/22/2020 CLINICAL DATA:  Lung nodule.  History of colon cancer. EXAM: CT CHEST WITHOUT CONTRAST TECHNIQUE: Multidetector CT imaging of the chest was performed following the standard protocol without IV contrast. COMPARISON:  None. FINDINGS: Cardiovascular: No significant vascular findings. Normal heart size. No pericardial effusion. Mediastinum/Nodes: No enlarged mediastinal or axillary lymph nodes. Thyroid gland, trachea, and esophagus demonstrate no significant findings. Lungs/Pleura: Minimal left pleural effusion is noted with adjacent subsegmental atelectasis. No pneumothorax is noted. Minimal right posterior basilar subsegmental  atelectasis is noted. Multiple pulmonary nodules are noted bilaterally concerning for metastatic disease. The largest measures 1 cm in the right upper lobe best seen on image number 74 series 5. Upper Abdomen: No acute abnormality. Musculoskeletal: No chest wall mass or suspicious bone lesions identified. IMPRESSION: Multiple pulmonary nodules are noted bilaterally consistent with metastatic disease. Minimal left pleural effusion is noted with adjacent subsegmental atelectasis. Electronically Signed   By: Marijo Conception M.D.   On: 09/22/2020 12:55   CT ABDOMEN PELVIS W CONTRAST  Result Date: 09/19/2020 CLINICAL DATA:  Constipation, bloating.  Bowel obstruction suspected EXAM: CT ABDOMEN AND PELVIS WITH CONTRAST TECHNIQUE: Multidetector CT imaging of the abdomen and pelvis was performed using the standard protocol following bolus administration of intravenous contrast. CONTRAST:  157m OMNIPAQUE IOHEXOL 300 MG/ML  SOLN COMPARISON:  None. FINDINGS: Lower chest: Right lower lobe nodule on image 8 measures 6 mm. Mild subpleural nodularity in the left lung base. No effusions. Heart is normal size. Hepatobiliary: No focal hepatic abnormality. Gallbladder unremarkable. Pancreas: No focal abnormality or ductal dilatation. Spleen: No focal abnormality.  Normal size. Adrenals/Urinary Tract: No adrenal abnormality. No focal renal abnormality. No stones or hydronephrosis. Stomach/Bowel: Stomach and small bowel decompressed, unremarkable. There is moderate distention of the colon with stool and gas to the level of the sigmoid colon where there appears to be an annular constricting lesion extending approximately 4 cm in the mid sigmoid colon. The inferior aspect of this annular lesion appears to extend to involve the dome of the bladder. Vascular/Lymphatic: Aortic atherosclerosis. No evidence of aneurysm or adenopathy. Reproductive: Prostate enlargement with calcifications. Other: No free fluid or free air. Musculoskeletal:  No acute bony abnormality. 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 This appears to cause at least partial obstruction with the colon moderately distended with large amount of stool and gas proximal to this lesion. This lesion appears to extend to involve the dome of the bladder. 6 mm right lower lobe pulmonary nodule. Small subpleural nodules in the left lower lung. Given the finding within the sigmoid  colon, cannot exclude metastases. Aortic atherosclerosis. Prostate enlargement. These results were called by telephone at the time of interpretation on 09/19/2020 at 1:18 am to provider [facility] , who verbally acknowledged these results. Electronically Signed   By: Rolm Baptise M.D.   On: 09/19/2020 01:23     CT CHEST WO CONTRAST  Result Date: 09/22/2020 CLINICAL DATA:  Lung nodule.  History of colon cancer. EXAM: CT CHEST WITHOUT CONTRAST TECHNIQUE: Multidetector CT imaging of the chest was performed following the standard protocol without IV contrast. COMPARISON:  None. FINDINGS: Cardiovascular: No significant vascular findings. Normal heart size. No pericardial effusion. Mediastinum/Nodes: No enlarged mediastinal or axillary lymph nodes. Thyroid gland, trachea, and esophagus demonstrate no significant findings. Lungs/Pleura: Minimal left pleural effusion is noted with adjacent subsegmental atelectasis. No pneumothorax is noted. Minimal right posterior basilar subsegmental atelectasis is noted. Multiple pulmonary nodules are noted bilaterally concerning for metastatic disease. The largest measures 1 cm in the right upper lobe best seen on image number 74 series 5. Upper Abdomen: No acute abnormality. Musculoskeletal: No chest wall mass or suspicious bone lesions identified. IMPRESSION: Multiple pulmonary nodules are noted bilaterally consistent with metastatic disease. Minimal left pleural effusion is  noted with adjacent subsegmental atelectasis. Electronically Signed   By: Marijo Conception M.D.   On: 09/22/2020 12:55   CT ABDOMEN PELVIS W CONTRAST  Result Date: 09/19/2020 CLINICAL DATA:  Constipation, bloating.  Bowel obstruction suspected EXAM: CT ABDOMEN AND PELVIS WITH CONTRAST TECHNIQUE: Multidetector CT imaging of the abdomen and pelvis was performed using the standard protocol following bolus administration of intravenous contrast. CONTRAST:  112m OMNIPAQUE IOHEXOL 300 MG/ML  SOLN COMPARISON:  None. FINDINGS: Lower chest: Right lower lobe nodule on image 8 measures 6 mm. Mild subpleural nodularity in the left lung base. No effusions. Heart is normal size. Hepatobiliary: No focal hepatic abnormality. Gallbladder unremarkable. Pancreas: No focal abnormality or ductal dilatation. Spleen: No focal abnormality.  Normal size. Adrenals/Urinary Tract: No adrenal abnormality. No focal renal abnormality. No stones or hydronephrosis. Stomach/Bowel: Stomach and small bowel decompressed, unremarkable. There is moderate distention of the colon with stool and gas to the level of the sigmoid colon where there appears to be an annular constricting lesion extending approximately 4 cm in the mid sigmoid colon. The inferior aspect of this annular lesion appears to extend to involve the dome of the bladder. Vascular/Lymphatic: Aortic atherosclerosis. No evidence of aneurysm or adenopathy. Reproductive: Prostate enlargement with calcifications. Other: No free fluid or free air. Musculoskeletal: No acute bony abnormality. 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 This appears to cause at least partial obstruction with the colon moderately distended with large amount of stool and gas proximal to this lesion. This lesion appears to extend to involve the dome of the bladder. 6 mm right lower lobe pulmonary nodule. Small  subpleural nodules in the left lower lung. Given the finding within the sigmoid colon, cannot exclude metastases. Aortic atherosclerosis. Prostate enlargement. These results were called by telephone at the time of interpretation on 09/19/2020 at 1:18 am [facility] who verbally acknowledged these results. Electronically Signed   By: KRolm BaptiseM.D.   On: 09/19/2020 01:23     Assessment and Plan:  1.  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 -09/19/2020-partial colectomy and end colostomy, complex cystorrhaphy -CEA on 09/19/2020 was 63.2 -CT of the chest without contrast 09/22/2020-multiple pulmonary nodules noted bilaterally consistent with metastatic disease 2.  Leukocytosis 3.  Mild anemia  Mr. Kadlec presented with an obstructing colon mass concerning for malignancy.  Pathology is currently pending.  He had an elevated CEA and CT of the chest shows multiple pulmonary nodules all of which are concerning for metastatic colon cancer.  Preliminary findings were discussed with the patient and his significant other today.  We discussed that we will await the final pathology but that he will likely need systemic chemotherapy once he recovers from surgery.  Final recommendations pending pathology per Dr. Benay Spice.  We will arrange for outpatient follow-up to make final plans regarding treatment.  Recommendations: 1.  Await pathology results 2.  We will arrange for outpatient follow-up at the cancer center to discuss the final pathology and make final plans regardng treatment.  We will consider Port-A-Cath  placement as an outpatient once we finalize a treatment plan.  Thank you for this referral.   Mikey Bussing, DNP, AGPCNP-BC, AOCNP   Mr. Mellin was discharged before I could see him in the hospital. He will be scheduled for an outpatient appt. Within the next 1 week

## 2020-09-30 ENCOUNTER — Inpatient Hospital Stay: Payer: Medicare Other | Attending: Oncology | Admitting: Oncology

## 2020-09-30 ENCOUNTER — Other Ambulatory Visit: Payer: Self-pay

## 2020-09-30 VITALS — BP 149/72 | HR 80 | Temp 98.2°F | Resp 20 | Ht 68.0 in | Wt 192.4 lb

## 2020-09-30 DIAGNOSIS — C7802 Secondary malignant neoplasm of left lung: Secondary | ICD-10-CM

## 2020-09-30 DIAGNOSIS — C7801 Secondary malignant neoplasm of right lung: Secondary | ICD-10-CM

## 2020-09-30 DIAGNOSIS — C187 Malignant neoplasm of sigmoid colon: Secondary | ICD-10-CM

## 2020-09-30 DIAGNOSIS — Z809 Family history of malignant neoplasm, unspecified: Secondary | ICD-10-CM

## 2020-09-30 MED ORDER — OXYCODONE HCL 5 MG PO TABS: 5.0000 mg | ORAL_TABLET | Freq: Two times a day (BID) | ORAL | 0 refills | Status: DC | PRN

## 2020-09-30 NOTE — Progress Notes (Signed)
[facility] Patient Consult   Requesting MD: Coralie Keens    Keidan Aumiller 80 y.o.  1940/05/26    Reason for Consult: Colon cancer   HPI: [name] reports developing malaise and constipation approximately 1 week prior to presenting to the emergency room on 09/18/2020.  He developed abdominal distention and pain.  A CT of the abdomen and pelvis on 09/19/2020 revealed a right lower lobe nodule, no focal hepatic abnormality, moderate distention of the colon with stool and gas to the level of the sigmoid colon where there is an annular constricting lesion in the mid sigmoid.  No adenopathy.  He was admitted with a colonic obstruction.  He was taken to the operating room by Dr. Ninfa Linden on 09/19/2020.  There was complete obstruction of the sigmoid colon from an intraluminal mass with erosion into the dome of the bladder.  A partial bladder resection was performed in addition to a partial colectomy and end colostomy.  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  There were 2 separate tumors, 2.5 cm apart.  No macroscopic tumor perforation.  Tumor invaded the visceral peritoneum at the bladder and is staged as a T4b lesion.  With invasive carcinoma involving inked adherent tissue.  1 of 13 lymph nodes contained metastatic carcinoma.  No lymphovascular or perineural invasion.  No tumor deposit.  A staging chest CT on 09/22/2020 revealed multiple pulmonary nodules consistent with metastatic disease.   Mr. Bailon has never undergone a colonoscopy.  Past medical history: Childhood asthma  Past Surgical History:  Procedure Laterality Date   PARTIAL COLECTOMY N/A 09/19/2020   Procedure: PARTIAL COLECTOMY, WITH CREATION OF COLOSTOMY, COMPLEX BLADDER REPAIR;  Surgeon: Coralie Keens, MD;  Location: [facility];  Service: General;  Laterality: N/A;    Medications: Reviewed  Allergies: No Known Allergies  Family history: He had 11  siblings.  1 sister had "cancer ".  Social History:   He lives with his significant other and [place].  He works as a Financial planner.  He does not use cigarettes or alcohol.  No transfusion history.  No risk factor for HIV or hepatitis.  He has received COVID-19 vaccines.  ROS:   Positives include: Low-grade fever, constipation 4-5 days, decreased stool caliber, belching, abdominal pain-all prior to surgery.  Incisional bleeding 09/29/2020  A complete ROS was otherwise negative.  Physical Exam:  Blood pressure (!) 149/72, pulse 80, temperature 98.2 F (36.8 C), temperature source Oral, resp. rate 20, height _0  (1.727 m), weight 192 lb 6.4 oz (87.3 kg), SpO2 100 %.  Lungs: Clear bilaterally Cardiac: Regular rate and rhythm Abdomen: No hepatosplenomegaly, nontender, no mass, midline incision with staples in place, leakage of fluid from the inferior aspect of the incision, no surrounding erythema.  Left abdomen colostomy with brown stool GU: Testes without mass Vascular: No leg edema Lymph nodes: No cervical, supraclavicular, axillary, or inguinal nodes Neurologic: Alert and oriented, the motor exam appears intact in the upper and lower extremities bilaterally Skin: No rash Musculoskeletal: No spine tenderness   LAB:  CBC  Lab Results  Component Value Date   WBC 13.8 (H) 09/21/2020   HGB 12.8 (L) 09/21/2020   HCT 38.8 (L) 09/21/2020   MCV 95.8 09/21/2020   PLT 241 09/21/2020   NEUTROABS 24.8 (H) 09/19/2020        CMP  Lab Results  Component Value Date   NA 136 09/20/2020   K 4.1 09/20/2020   CL 98  09/20/2020   CO2 27 09/20/2020   GLUCOSE 153 (H) 09/20/2020   BUN 18 09/20/2020   CREATININE 1.01 09/20/2020   CALCIUM 8.2 (L) 09/20/2020   PROT 7.3 09/19/2020   ALBUMIN 3.8 09/19/2020   AST 20 09/19/2020   ALT 21 09/19/2020   ALKPHOS 68 09/19/2020   BILITOT 0.9 09/19/2020   GFRNONAA >60 09/20/2020     Lab Results  Component Value Date   CEA1 63.2 (H)  09/19/2020    Imaging: As per HPI, CT images from    Assessment/Plan:   Sigmoid colon cancer, stage IV (pT4b,pN1,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 Falero has been diagnosed with colon cancer.  He was diagnosed with a locally advanced sigmoid colon tumor and the staging chest CT is consistent with bilateral lung metastases.  I discussed the diagnosis of metastatic colon cancer with Mr. Linville.  He understands no therapy will be curative.  We discussed the expected median survival and treatment options.  I recommend systemic chemotherapy +/- biologic therapy in an attempt to prolong his survival.  We will submit the resected tumor for MSI and next generation sequencing.  I will present his case at the GI tumor conference.  I will asked Dr. Ninfa Linden to place a Port-A-Cath in anticipation of beginning chemotherapy within the next 3-4 weeks.  He will follow-up with Dr. Ninfa Linden for management of the abdominal wound.  Mr. Garris will return for an office visit and further discussion in 2 weeks.  Betsy Coder, MD  09/30/2020, 4:50 PM

## 2020-10-01 ENCOUNTER — Encounter (HOSPITAL_BASED_OUTPATIENT_CLINIC_OR_DEPARTMENT_OTHER): Payer: Self-pay | Admitting: Surgery

## 2020-10-01 ENCOUNTER — Other Ambulatory Visit: Payer: Self-pay | Admitting: Surgery

## 2020-10-01 ENCOUNTER — Other Ambulatory Visit: Payer: Self-pay

## 2020-10-01 ENCOUNTER — Encounter: Payer: Self-pay | Admitting: *Deleted

## 2020-10-01 NOTE — Progress Notes (Signed)
Per Dr. Benay Spice request: Email to [facility] to request MSI testing on case #[id number] dated 09/19/20. Then send block to [facility]. Dx: C18.7 Stage: IV

## 2020-10-02 ENCOUNTER — Other Ambulatory Visit: Payer: Self-pay

## 2020-10-03 LAB — SURGICAL PATHOLOGY

## 2020-10-06 ENCOUNTER — Encounter: Payer: Self-pay | Admitting: *Deleted

## 2020-10-06 NOTE — Progress Notes (Signed)
Completed FMLA for his significant other, Carman Ching faxed to [facility] [employer] [phone]. Copy to HIM and original will be returned to her on visit of 10/17/20.

## 2020-10-06 NOTE — Progress Notes (Signed)

## 2020-10-06 NOTE — H&P (Signed)
Craig Best is an 80 y.o. male.   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  He underwent a resection and a colostomy.  It was eroding into the bladder and was node positive on final pathology.  He was seen by medical oncology and chemotherapy has been recommended.   He will now be undergoing port a cath insertion. He is stable post op and without complaints  Past Medical History:  Diagnosis Date   Cancer ([place]) 09/19/2020   adenocarcinoma sigmoid colon    Past Surgical History:  Procedure Laterality Date   PARTIAL COLECTOMY N/A 09/19/2020   Procedure: PARTIAL COLECTOMY, WITH CREATION OF COLOSTOMY, COMPLEX BLADDER REPAIR;  Surgeon: Coralie Keens, MD;  Location: [facility];  Service: General;  Laterality: N/A;    History reviewed. No pertinent family history. Social History:  reports that he has never smoked. He has never been exposed to tobacco smoke. He has never used smokeless tobacco. He reports that he does not drink alcohol and does not use drugs.  Allergies: No Known Allergies  No medications prior to admission.    No results found for this or any previous visit (from the past 48 hour(s)). No results found.  Review of Systems  All other systems reviewed and are negative.  Height 5\' 8"  (1.727 m), weight 87.1 kg. Physical Exam Constitutional:      General: He is not in acute distress.    Appearance: Normal appearance.  HENT:     Head: Normocephalic and atraumatic.  Eyes:     Pupils: Pupils are equal, round, and reactive to light.  Cardiovascular:     Rate and Rhythm: Normal rate and regular rhythm.  Pulmonary:     Effort: Pulmonary effort is normal.     Breath sounds: Normal breath sounds.  Abdominal:     Palpations: Abdomen is soft.     Comments: Ostomy viable  Midline open wound clean  Musculoskeletal:        General: Normal range of motion.     Cervical back: Normal range of motion.  Skin:     General: Skin is warm and dry.  Neurological:     General: No focal deficit present.     Mental Status: He is alert.  Psychiatric:        Behavior: Behavior normal.        Judgment: Judgment normal.     Assessment/Plan History of colon cancer with chemotherapy planned.  We will proceed to the OR for insertion of a port-a-cath for intravenous chemotherapy.  I explained the procedure in detail.  We discussed the risks which include but are not limited to bleeding, infection, injury to surrounding structures, pneumothorax, the need for other procedures, cardiopulmonary issues, etc.  He agrees to proceed.  Coralie Keens, MD 10/06/2020, 4:51 PM

## 2020-10-07 ENCOUNTER — Ambulatory Visit (HOSPITAL_COMMUNITY): Payer: Medicare Other

## 2020-10-07 ENCOUNTER — Ambulatory Visit (HOSPITAL_BASED_OUTPATIENT_CLINIC_OR_DEPARTMENT_OTHER)
Admission: RE | Admit: 2020-10-07 | Discharge: 2020-10-07 | Disposition: A | Discharge status: Home / Self Care | Payer: Medicare Other | Attending: Surgery | Admitting: Surgery

## 2020-10-07 ENCOUNTER — Ambulatory Visit (HOSPITAL_BASED_OUTPATIENT_CLINIC_OR_DEPARTMENT_OTHER): Payer: Medicare Other | Admitting: Anesthesiology

## 2020-10-07 ENCOUNTER — Encounter (HOSPITAL_BASED_OUTPATIENT_CLINIC_OR_DEPARTMENT_OTHER): Payer: Self-pay | Admitting: Surgery

## 2020-10-07 ENCOUNTER — Other Ambulatory Visit (HOSPITAL_COMMUNITY): Payer: Self-pay | Admitting: Surgery

## 2020-10-07 ENCOUNTER — Encounter (HOSPITAL_BASED_OUTPATIENT_CLINIC_OR_DEPARTMENT_OTHER)
Admission: RE | Disposition: A | Discharge status: Home / Self Care | Payer: Self-pay | Source: Home / Self Care | Attending: Surgery

## 2020-10-07 ENCOUNTER — Other Ambulatory Visit: Payer: Self-pay

## 2020-10-07 DIAGNOSIS — Z419 Encounter for procedure for purposes other than remedying health state, unspecified: Secondary | ICD-10-CM

## 2020-10-07 DIAGNOSIS — Z452 Encounter for adjustment and management of vascular access device: Secondary | ICD-10-CM

## 2020-10-07 DIAGNOSIS — C189 Malignant neoplasm of colon, unspecified: Secondary | ICD-10-CM | POA: Insufficient documentation

## 2020-10-07 DIAGNOSIS — Z933 Colostomy status: Secondary | ICD-10-CM | POA: Diagnosis not present

## 2020-10-07 HISTORY — PX: PORTACATH PLACEMENT: SHX2246

## 2020-10-07 SURGERY — INSERTION, TUNNELED CENTRAL VENOUS DEVICE, WITH PORT
Anesthesia: General | Site: Chest | Laterality: Left

## 2020-10-07 MED ORDER — AMISULPRIDE (ANTIEMETIC) 5 MG/2ML IV SOLN: 10.0000 mg | Freq: Once | INTRAVENOUS | Status: DC | PRN

## 2020-10-07 MED ORDER — PROPOFOL 10 MG/ML IV BOLUS
INTRAVENOUS | Status: DC | PRN
  Administered 2020-10-07: 200 mg via INTRAVENOUS

## 2020-10-07 MED ORDER — HEPARIN SOD (PORK) LOCK FLUSH 100 UNIT/ML IV SOLN
INTRAVENOUS | Status: AC
  Filled 2020-10-07: qty 5

## 2020-10-07 MED ORDER — PHENYLEPHRINE HCL (PRESSORS) 10 MG/ML IV SOLN
INTRAVENOUS | Status: DC | PRN
  Administered 2020-10-07 (×2): 80 ug via INTRAVENOUS

## 2020-10-07 MED ORDER — PROMETHAZINE HCL 25 MG/ML IJ SOLN: 6.2500 mg | INTRAMUSCULAR | Status: DC | PRN

## 2020-10-07 MED ORDER — HYDROMORPHONE HCL 1 MG/ML IJ SOLN: 0.2500 mg | INTRAMUSCULAR | Status: DC | PRN

## 2020-10-07 MED ORDER — ONDANSETRON HCL 4 MG/2ML IJ SOLN
INTRAMUSCULAR | Status: AC
  Filled 2020-10-07: qty 2

## 2020-10-07 MED ORDER — DEXAMETHASONE SODIUM PHOSPHATE 4 MG/ML IJ SOLN
INTRAMUSCULAR | Status: DC | PRN
  Administered 2020-10-07: 4 mg via INTRAVENOUS

## 2020-10-07 MED ORDER — HEPARIN (PORCINE) IN NACL 2-0.9 UNITS/ML
INTRAMUSCULAR | Status: AC | PRN
  Administered 2020-10-07: 1 via INTRAVENOUS

## 2020-10-07 MED ORDER — FENTANYL CITRATE (PF) 100 MCG/2ML IJ SOLN
INTRAMUSCULAR | Status: AC
  Filled 2020-10-07: qty 2

## 2020-10-07 MED ORDER — BUPIVACAINE-EPINEPHRINE 0.5% -1:200000 IJ SOLN
INTRAMUSCULAR | Status: DC | PRN
  Administered 2020-10-07: 10 mL

## 2020-10-07 MED ORDER — CHLORHEXIDINE GLUCONATE CLOTH 2 % EX PADS
6.0000 | MEDICATED_PAD | Freq: Once | CUTANEOUS | Status: DC
Start: 2020-10-07 — End: 2020-10-07

## 2020-10-07 MED ORDER — CEFAZOLIN SODIUM-DEXTROSE 2-4 GM/100ML-% IV SOLN
2.0000 g | INTRAVENOUS | Status: AC
  Administered 2020-10-07: 2 g via INTRAVENOUS

## 2020-10-07 MED ORDER — DEXAMETHASONE SODIUM PHOSPHATE 10 MG/ML IJ SOLN
INTRAMUSCULAR | Status: AC
  Filled 2020-10-07: qty 1

## 2020-10-07 MED ORDER — ACETAMINOPHEN 500 MG PO TABS
1000.0000 mg | ORAL_TABLET | ORAL | Status: AC
  Administered 2020-10-07: 1000 mg via ORAL

## 2020-10-07 MED ORDER — LACTATED RINGERS IV SOLN: INTRAVENOUS | Status: DC

## 2020-10-07 MED ORDER — BUPIVACAINE-EPINEPHRINE (PF) 0.5% -1:200000 IJ SOLN
INTRAMUSCULAR | Status: AC
  Filled 2020-10-07: qty 30

## 2020-10-07 MED ORDER — FENTANYL CITRATE (PF) 100 MCG/2ML IJ SOLN
INTRAMUSCULAR | Status: DC | PRN
  Administered 2020-10-07: 50 ug via INTRAVENOUS
  Administered 2020-10-07 (×2): 25 ug via INTRAVENOUS

## 2020-10-07 MED ORDER — EPHEDRINE SULFATE 50 MG/ML IJ SOLN
INTRAMUSCULAR | Status: DC | PRN
  Administered 2020-10-07 (×2): 10 mg via INTRAVENOUS

## 2020-10-07 MED ORDER — OXYCODONE HCL 5 MG PO TABS: 5.0000 mg | ORAL_TABLET | Freq: Once | ORAL | Status: DC | PRN

## 2020-10-07 MED ORDER — HEPARIN SOD (PORK) LOCK FLUSH 100 UNIT/ML IV SOLN
INTRAVENOUS | Status: DC | PRN
  Administered 2020-10-07: 500 [IU]

## 2020-10-07 MED ORDER — HEPARIN (PORCINE) IN NACL 1000-0.9 UT/500ML-% IV SOLN
INTRAVENOUS | Status: AC
  Filled 2020-10-07: qty 500

## 2020-10-07 MED ORDER — OXYCODONE HCL 5 MG PO TABS: 5.0000 mg | ORAL_TABLET | Freq: Four times a day (QID) | ORAL | 0 refills | Status: DC | PRN

## 2020-10-07 MED ORDER — LIDOCAINE HCL (CARDIAC) PF 100 MG/5ML IV SOSY
PREFILLED_SYRINGE | INTRAVENOUS | Status: DC | PRN
  Administered 2020-10-07: 60 mg via INTRAVENOUS

## 2020-10-07 MED ORDER — ACETAMINOPHEN 500 MG PO TABS
ORAL_TABLET | ORAL | Status: AC
  Filled 2020-10-07: qty 2

## 2020-10-07 MED ORDER — OXYCODONE HCL 5 MG/5ML PO SOLN: 5.0000 mg | Freq: Once | ORAL | Status: DC | PRN

## 2020-10-07 MED ORDER — CHLORHEXIDINE GLUCONATE CLOTH 2 % EX PADS: 6.0000 | MEDICATED_PAD | Freq: Once | CUTANEOUS | Status: DC

## 2020-10-07 MED ORDER — PROPOFOL 10 MG/ML IV BOLUS
INTRAVENOUS | Status: AC
  Filled 2020-10-07: qty 20

## 2020-10-07 MED ORDER — CEFAZOLIN SODIUM-DEXTROSE 2-4 GM/100ML-% IV SOLN
INTRAVENOUS | Status: AC
  Filled 2020-10-07: qty 100

## 2020-10-07 SURGICAL SUPPLY — 42 items
ADH SKN CLS APL DERMABOND .7 (GAUZE/BANDAGES/DRESSINGS) ×2
APL PRP STRL LF DISP 70% ISPRP (MISCELLANEOUS) ×1
BAG DECANTER FOR FLEXI CONT (MISCELLANEOUS) ×2 IMPLANT
BLADE SURG 15 STRL LF DISP TIS (BLADE) ×1 IMPLANT
BLADE SURG 15 STRL SS (BLADE) ×2
CANISTER SUCT 1200ML W/VALVE (MISCELLANEOUS) IMPLANT
CHLORAPREP W/TINT 26 (MISCELLANEOUS) ×2 IMPLANT
COVER BACK TABLE 60X90IN (DRAPES) ×2 IMPLANT
COVER MAYO STAND STRL (DRAPES) ×2 IMPLANT
DECANTER SPIKE VIAL GLASS SM (MISCELLANEOUS) IMPLANT
DERMABOND ADVANCED (GAUZE/BANDAGES/DRESSINGS) ×2
DERMABOND ADVANCED .7 DNX12 (GAUZE/BANDAGES/DRESSINGS) ×2 IMPLANT
DRAPE C-ARM 42X72 X-RAY (DRAPES) ×2 IMPLANT
DRAPE LAPAROSCOPIC ABDOMINAL (DRAPES) ×2 IMPLANT
DRAPE UTILITY XL STRL (DRAPES) ×2 IMPLANT
ELECT REM PT RETURN 9FT ADLT (ELECTROSURGICAL) ×2
ELECTRODE REM PT RTRN 9FT ADLT (ELECTROSURGICAL) ×1 IMPLANT
GLOVE SURG POLYISO LF SZ6 (GLOVE) ×2 IMPLANT
GLOVE SURG POLYISO LF SZ7 (GLOVE) ×4 IMPLANT
GLOVE SURG SIGNA 7.5 PF LTX (GLOVE) ×2 IMPLANT
GLOVE SURG UNDER POLY LF SZ6 (GLOVE) ×2 IMPLANT
GLOVE SURG UNDER POLY LF SZ7 (GLOVE) ×2 IMPLANT
GOWN STRL REUS W/ TWL LRG LVL3 (GOWN DISPOSABLE) ×3 IMPLANT
GOWN STRL REUS W/ TWL XL LVL3 (GOWN DISPOSABLE) ×1 IMPLANT
GOWN STRL REUS W/TWL LRG LVL3 (GOWN DISPOSABLE) ×6
GOWN STRL REUS W/TWL XL LVL3 (GOWN DISPOSABLE) ×2
IV KIT MINILOC 20X1 SAFETY (NEEDLE) IMPLANT
KIT PORT POWER 8FR ISP CVUE (Port) ×2 IMPLANT
NEEDLE HYPO 25X1 1.5 SAFETY (NEEDLE) ×2 IMPLANT
PACK BASIN DAY SURGERY FS (CUSTOM PROCEDURE TRAY) ×2 IMPLANT
PENCIL SMOKE EVACUATOR (MISCELLANEOUS) ×2 IMPLANT
SLEEVE SCD COMPRESS KNEE MED (STOCKING) ×2 IMPLANT
SUT MNCRL AB 4-0 PS2 18 (SUTURE) ×2 IMPLANT
SUT PROLENE 2 0 SH DA (SUTURE) ×2 IMPLANT
SUT SILK 2 0 TIES 17X18 (SUTURE)
SUT SILK 2-0 18XBRD TIE BLK (SUTURE) IMPLANT
SUT VIC AB 3-0 SH 27 (SUTURE) ×2
SUT VIC AB 3-0 SH 27X BRD (SUTURE) ×1 IMPLANT
SYR CONTROL 10ML LL (SYRINGE) ×2 IMPLANT
TOWEL GREEN STERILE FF (TOWEL DISPOSABLE) ×2 IMPLANT
TUBE CONNECTING 20X1/4 (TUBING) IMPLANT
YANKAUER SUCT BULB TIP NO VENT (SUCTIONS) IMPLANT

## 2020-10-07 NOTE — Anesthesia Postprocedure Evaluation (Signed)
Anesthesia Post Note  Patient: Unique Searfoss  Procedure(s) Performed: PORT-A-CATH INSERTION (Left: Chest)     Patient location during evaluation: PACU Anesthesia Type: General Level of consciousness: awake and alert Pain management: pain level controlled Vital Signs Assessment: post-procedure vital signs reviewed and stable Respiratory status: spontaneous breathing, nonlabored ventilation and respiratory function stable Cardiovascular status: blood pressure returned to baseline and stable Postop Assessment: no apparent nausea or vomiting Anesthetic complications: no   No notable events documented.  Last Vitals:  Vitals:   10/07/20 1200 10/07/20 1210  BP: 125/70 (!) 143/71  Pulse: 80 73  Resp: (!) 9 10  Temp:  36.8 C  SpO2: 97% 97%    Last Pain:  Vitals:   10/07/20 1230  TempSrc:   PainSc: 0-No pain                 Lynda Rainwater

## 2020-10-07 NOTE — Op Note (Signed)
PORT-A-CATH INSERTION  Procedure Note  Craig Best 10/07/2020   Pre-op Diagnosis: COLON CANCER, NEEDS FOR CHEMOTHERAPY     Post-op Diagnosis: same  Procedure(s): PORT-A-CATH INSERTION LEFT SUBCLAVIAN VEIN (8 fr)  Surgeon(s): Coralie Keens, MD  Anesthesia: General  Staff:  Circulator: Eston Esters, RN Radiology Technologist: Donnel Saxon, RT Relief Scrub: Donnita Falls, RN Scrub Person: Lyndal Pulley, Orie Fisherman, RN Circulator Assistant: McDonough-Hughes, Delene Ruffini, RN  Estimated Blood Loss: Minimal               Indications: [name] who recently underwent a Hartman's procedure for obstructing colon cancer eroding into the bladder.  He has seen medical oncology and chemotherapy is been recommended.  He now presents for Port-A-Cath insertion  Procedure: The patient was brought to the operating room identifies correct patient.  He was placed upon the operating table and general anesthesia was induced.  His left chest and neck were prepped and draped in the usual sterile fashion.  He was then placed in the Trendelenburg position.  I anesthetized the skin of the left chest and clavicle with Marcaine.  I then used the introducer needle to easily cannulate the left subclavian vein.  A wire was passed through the needle and into the central venous system under fluoroscopy.  The needle was then removed.  I anesthetized skin further with Marcaine and then made an incision at the wire site with a scalpel.  I then dissected down to the subtenons tissue and chest wall with the cautery.  I then bluntly created a pocket for the port.  An 8 French [place] port was brought to the field and flushed appropriately.  The port fit easily into the pocket.  I then passed the venous dilator and introducer sheath over the wire into the central venous system easily.  The wire and dilator were removed.  The catheter was attached to the port.  I cut an appropriate  length.  I placed the port into the pocket and then fed the catheter down the peel-away sheath.  The sheath was then peeled away leaving the cath in central venous system.  Good placement in the superior vena cava appeared to be achieved.  I accessed the port and good flush and return were demonstrated.  I then sutured the port in place with 2 separate 3-0 Prolene sutures.  I then instilled concentrated heparin solution into the port.  I closed the subcutaneous tissue with interrupted 3-0 Vicryl sutures and closed skin with a running 4-0 Monocryl.  Dermabond was then applied.  The patient tolerated procedure well.  All the counts were correct at the end of the procedure.  The patient was then extubated in the operating room and taken in a stable condition to the recovery room.  Coralie Keens   Date: 10/07/2020  Time: 11:37 AM

## 2020-10-07 NOTE — Transfer of Care (Signed)
Immediate Anesthesia Transfer of Care Note  Patient: Craig Best  Procedure(s) Performed: PORT-A-CATH INSERTION (Left: Chest)  Patient Location: PACU  Anesthesia Type:General  Level of Consciousness: awake, alert  and oriented  Airway & Oxygen Therapy: Patient Spontanous Breathing and Patient connected to face mask oxygen  Post-op Assessment: Report given to RN and Post -op Vital signs reviewed and stable  Post vital signs: Reviewed and stable  Last Vitals:  Vitals Value Taken Time  BP 117/57 10/07/20 1141  Temp    Pulse 73 10/07/20 1143  Resp 15 10/07/20 1143  SpO2 100 % 10/07/20 1143  Vitals shown include unvalidated device data.  Last Pain:  Vitals:   10/07/20 0947  TempSrc: Oral  PainSc: 0-No pain         Complications: No notable events documented.

## 2020-10-07 NOTE — Anesthesia Preprocedure Evaluation (Signed)
Anesthesia Evaluation  Patient identified by MRN, date of birth, ID band Patient awake    Reviewed: Allergy & Precautions, H&P , NPO status , Patient's Chart, lab work & pertinent test results, reviewed documented beta blocker date and time   Airway Mallampati: III  TM Distance: >3 FB Neck ROM: full    Dental no notable dental hx. (+) Poor Dentition, Missing, Edentulous Upper,    Pulmonary neg pulmonary ROS,    Pulmonary exam normal breath sounds clear to auscultation       Cardiovascular Exercise Tolerance: Good negative cardio ROS   Rhythm:regular Rate:Normal     Neuro/Psych negative neurological ROS  negative psych ROS   GI/Hepatic negative GI ROS, Neg liver ROS,   Endo/Other  negative endocrine ROS  Renal/GU negative Renal ROS  negative genitourinary   Musculoskeletal   Abdominal   Peds  Hematology negative hematology ROS (+)   Anesthesia Other Findings Colon Cancer  Reproductive/Obstetrics negative OB ROS                             Anesthesia Physical  Anesthesia Plan  ASA: 3  Anesthesia Plan: General   Post-op Pain Management:    Induction: Intravenous  PONV Risk Score and Plan: 2 and Ondansetron, Midazolam and Treatment may vary due to age or medical condition  Airway Management Planned: LMA  Additional Equipment: None  Intra-op Plan:   Post-operative Plan: Extubation in OR  Informed Consent: I have reviewed the patients History and Physical, chart, labs and discussed the procedure including the risks, benefits and alternatives for the proposed anesthesia with the patient or authorized representative who has indicated his/her understanding and acceptance.     Dental Advisory Given  Plan Discussed with: CRNA and Anesthesiologist  Anesthesia Plan Comments:         Anesthesia Quick Evaluation

## 2020-10-07 NOTE — Interval H&P Note (Signed)
History and Physical Interval Note:no change in H and P  10/07/2020 10:39 AM  Craig Best  has presented today for surgery, with the diagnosis of COLON CANCER, NEEDS FOR CHEMOTHERAPY.  The various methods of treatment have been discussed with the patient and family. After consideration of risks, benefits and other options for treatment, the patient has consented to  Procedure(s): PORT-A-CATH INSERTION (N/A) as a surgical intervention.  The patient's history has been reviewed, patient examined, no change in status, stable for surgery.  I have reviewed the patient's chart and labs.  Questions were answered to the patient's satisfaction.     Coralie Keens

## 2020-10-07 NOTE — Discharge Instructions (Addendum)
Ok to shower starting tomorrow  Ice pack and tylenol also for pain. No Tylenol until after 4pm today.   Post Anesthesia Home Care Instructions  Activity: Get plenty of rest for the remainder of the day. A responsible individual must stay with you for 24 hours following the procedure.  For the next 24 hours, DO NOT: -Drive a car -Paediatric nurse -Drink alcoholic beverages -Take any medication unless instructed by your physician -Make any legal decisions or sign important papers.  Meals: Start with liquid foods such as gelatin or soup. Progress to regular foods as tolerated. Avoid greasy, spicy, heavy foods. If nausea and/or vomiting occur, drink only clear liquids until the nausea and/or vomiting subsides. Call your physician if vomiting continues.  Special Instructions/Symptoms: Your throat may feel dry or sore from the anesthesia or the breathing tube placed in your throat during surgery. If this causes discomfort, gargle with warm salt water. The discomfort should disappear within 24 hours.  If you had a scopolamine patch placed behind your ear for the management of post- operative nausea and/or vomiting:  1. The medication in the patch is effective for 72 hours, after which it should be removed.  Wrap patch in a tissue and discard in the trash. Wash hands thoroughly with soap and water. 2. You may remove the patch earlier than 72 hours if you experience unpleasant side effects which may include dry mouth, dizziness or visual disturbances. 3. Avoid touching the patch. Wash your hands with soap and water after contact with the patch.

## 2020-10-07 NOTE — Anesthesia Procedure Notes (Signed)
Procedure Name: LMA Insertion Date/Time: 10/07/2020 11:08 AM Performed by: Maryella Shivers, CRNA Pre-anesthesia Checklist: Patient identified, Emergency Drugs available, Suction available and Patient being monitored Patient Re-evaluated:Patient Re-evaluated prior to induction Oxygen Delivery Method: Circle system utilized Preoxygenation: Pre-oxygenation with 100% oxygen Induction Type: IV induction Ventilation: Mask ventilation without difficulty LMA: LMA inserted LMA Size: 4.0 Number of attempts: 1 Airway Equipment and Method: Bite block Placement Confirmation: positive ETCO2 Tube secured with: Tape Dental Injury: Teeth and Oropharynx as per pre-operative assessment

## 2020-10-08 NOTE — Progress Notes (Signed)
Unable to leave VM

## 2020-10-09 ENCOUNTER — Encounter (HOSPITAL_BASED_OUTPATIENT_CLINIC_OR_DEPARTMENT_OTHER): Payer: Self-pay | Admitting: Surgery

## 2020-10-10 ENCOUNTER — Other Ambulatory Visit: Payer: Self-pay

## 2020-10-10 ENCOUNTER — Ambulatory Visit (HOSPITAL_COMMUNITY)
Admission: RE | Admit: 2020-10-10 | Discharge: 2020-10-10 | Disposition: A | Discharge status: Home / Self Care | Payer: Medicare Other | Source: Ambulatory Visit | Attending: General Surgery | Admitting: General Surgery

## 2020-10-10 DIAGNOSIS — K9409 Other complications of colostomy: Secondary | ICD-10-CM | POA: Diagnosis present

## 2020-10-10 DIAGNOSIS — K5901 Slow transit constipation: Secondary | ICD-10-CM | POA: Diagnosis not present

## 2020-10-10 DIAGNOSIS — K9400 Colostomy complication, unspecified: Secondary | ICD-10-CM | POA: Diagnosis not present

## 2020-10-10 MED ORDER — POLYETHYLENE GLYCOL 3350 17 GM/SCOOP PO POWD: 17.0000 g | Freq: Every day | ORAL | 1 refills | Status: AC

## 2020-10-10 MED ORDER — SORBITOL 70 % SOLN
960.0000 mL | TOPICAL_OIL | ORAL | Status: DC
  Filled 2020-10-10: qty 473

## 2020-10-10 NOTE — Discharge Instructions (Signed)
We administered a SMOG enema through your stoma today.  Around 400ML.  You had 2 large bowel movements of hard stool that progressed to less firm, then soft green stool.  Stop Fiber con tabs Begin Miralx (called to pharmacy) Call me and let me know if he continues to have stool this evening.  WE can see you next week if needed.  Increase fiber, water and walking as you can tolerate.  [phone]

## 2020-10-10 NOTE — Progress Notes (Signed)
[facility]   Reason for visit:  New LLQ  end colostomy HPI:  Colonic obstruction, recurrent cancer, eroding into bladder wall.  Low anterior resection with colostomy  Has had 3 days of little stoma output, abdominal fullness and had low grade fever last night, relieved with Tylenol.   Stoma was 95% black postoperatively. This has sloughed away and stoma is pink and moist, flush. His stoma is functioning today and is 1/3 full of hard dark pellets of brown stool.   ROS  Review of Systems  Constitutional:  Positive for appetite change and fever.       Wife reports fever of 99.9 last evening, relieved with tylenol.  Temp 98.4 today.   Gastrointestinal:  Positive for abdominal pain and constipation.  Skin:  Positive for wound.  Psychiatric/Behavioral: Negative.    Vital signs:  BP (!) 166/69   Pulse 100   Temp 98.4 F (36.9 C) (Oral)   Resp 17   Ht $R'5\' 8"'va$  (1.727 m)   Wt 84.8 kg   SpO2 98%   BMI 28.43 kg/m  Exam:  Physical Exam Pulmonary:     Effort: Pulmonary effort is normal.  Abdominal:     Tenderness: There is abdominal tenderness.     Comments: Firm on left side, consistent with constipation.   Skin:    General: Skin is warm and dry.     Comments: Midline abdominal surgical wound  Neurological:     Mental Status: He is alert.    Stoma type/location:  LLQ pink moist and flush.   Stomal assessment/size:  1 3/8" pink and moist, flush.  Digital exam reveals hard stool palpable.  He has only had 1-2 pain pills since surgery and is trying to drink adequate fluids.  He is walking daily as well.   I will perform a gentle SMOG enema to help move things along.   Peristomal assessment:  Denuded skin from 6 to 10o'clock.  Wife has been cutting barrier too large. We will re-measure and review this today.  Treatment options for stomal/peristomal skin: Barrier ring and 2 3/4" pouch is working well.  No leaks  Output: Hard pellets of stool, appears constipated.  Ostomy  pouching: 2 pc. 2 3/4 pouch with barrier ring.  Education provided:  We discuss the enema, just to get things moving.  He has been taking a fiber laxative, but it is large and hard to swallow.  I have asked him to begin miralax powder daily until stool is loose.  Then, recommend colace daily.  He agrees.  He has been eating carbohydrate rich foods and avoiding fruits and vegetables. I have encouraged him to add these back to his diet as well.   After digitizing the stoma, I  instill 400ML of premixed SMOG solution via stoma and cone enema kit.  This is instilled slowly over 25 minutes.  We stop when he fills fullness and/or cramping.  Each cramp is followed by a large output of formed hard stool that progressively softens.  After we are no longer getting stool output, we remove irrigation pouch, measure stoma and re-pouch patient.  I send them home with a new pattern and instructions on how to measure stoma at pouch changes to ensure we are covering peristomal skin.  We add barrier ring today as well.      Impression/dx  Colostomy complicated   Constipation Discussion  SMOG enema Plan  Begin miralax daily.  Call office with questions/concerns    Visit  time: 120 minutes.   Domenic Moras FNP-BC

## 2020-10-15 ENCOUNTER — Encounter (HOSPITAL_COMMUNITY): Payer: Self-pay | Admitting: Oncology

## 2020-10-15 ENCOUNTER — Other Ambulatory Visit: Payer: Self-pay

## 2020-10-15 NOTE — Progress Notes (Signed)
The proposed treatment discussed in conference is for discussion purpose only and is not a binding recommendation.  The patients have not been physically examined, or presented with their treatment options.  Therefore, final treatment plans cannot be decided.  

## 2020-10-17 ENCOUNTER — Other Ambulatory Visit: Payer: Self-pay

## 2020-10-17 ENCOUNTER — Inpatient Hospital Stay: Payer: Medicare Other | Attending: Oncology | Admitting: Oncology

## 2020-10-17 VITALS — BP 167/80 | HR 97 | Temp 98.0°F | Resp 20 | Ht 68.0 in | Wt 184.6 lb

## 2020-10-17 DIAGNOSIS — C187 Malignant neoplasm of sigmoid colon: Secondary | ICD-10-CM | POA: Diagnosis not present

## 2020-10-17 DIAGNOSIS — C20 Malignant neoplasm of rectum: Secondary | ICD-10-CM | POA: Diagnosis present

## 2020-10-17 DIAGNOSIS — Z5111 Encounter for antineoplastic chemotherapy: Secondary | ICD-10-CM | POA: Diagnosis not present

## 2020-10-17 DIAGNOSIS — C7801 Secondary malignant neoplasm of right lung: Secondary | ICD-10-CM | POA: Insufficient documentation

## 2020-10-17 DIAGNOSIS — C7802 Secondary malignant neoplasm of left lung: Secondary | ICD-10-CM | POA: Insufficient documentation

## 2020-10-17 DIAGNOSIS — Z7189 Other specified counseling: Secondary | ICD-10-CM | POA: Diagnosis not present

## 2020-10-17 HISTORY — DX: Other specified counseling: Z71.89

## 2020-10-17 MED ORDER — PROCHLORPERAZINE MALEATE 10 MG PO TABS
10.0000 mg | ORAL_TABLET | Freq: Four times a day (QID) | ORAL | 0 refills | Status: DC | PRN
Start: 2020-10-17 — End: 2021-09-01

## 2020-10-17 MED ORDER — LIDOCAINE-PRILOCAINE 2.5-2.5 % EX CREA: 1.0000 "application " | TOPICAL_CREAM | CUTANEOUS | 0 refills | Status: DC | PRN

## 2020-10-17 NOTE — Progress Notes (Signed)
START ON PATHWAY REGIMEN - Colorectal     A cycle is every 14 days:     Oxaliplatin      Leucovorin      Fluorouracil      Fluorouracil   **Always confirm dose/schedule in your pharmacy ordering system**  Patient Characteristics: Distant Metastases, Nonsurgical Candidate, KRAS/NRAS Mutation Positive/Unknown (BRAF V600 Wild-Type/Unknown), Standard Cytotoxic Therapy, First Line Standard Cytotoxic Therapy, Bevacizumab Ineligible, PS = 0,1 Tumor Location: Colon Therapeutic Status: Distant Metastases Microsatellite/Mismatch Repair Status: MSS/pMMR BRAF Mutation Status: Wild-Type (no mutation) KRAS/NRAS Mutation Status: Mutation Positive Standard Cytotoxic Line of Therapy: First Line Standard Cytotoxic Therapy ECOG Performance Status: 1 Bevacizumab Eligibility: Ineligible Intent of Therapy: Non-Curative / Palliative Intent, Discussed with Patient

## 2020-10-17 NOTE — Progress Notes (Signed)
  [facility] OFFICE PROGRESS NOTE   Diagnosis: Colon cancer  INTERVAL HISTORY:   Craig Best returns as scheduled.  He feels well.  There is a small opening at the inferior aspect of the abdominal incision.  The colostomy is functioning well.  He underwent Port-A-Cath placement on 10/07/2020.  Objective:  Vital signs in last 24 hours:  Blood pressure (!) 167/80, pulse 97, temperature 98 F (36.7 C), temperature source Oral, resp. rate 20, height $RemoveBe'5\' 8"'xZeVXiHQJ$  (1.727 m), weight 184 lb 9.6 oz (83.7 kg), SpO2 100 %.    Resp: Lungs clear bilaterally Cardio: Regular rate and rhythm GI: No hepatomegaly, left lower quadrant colostomy, superficial 2-3 mm opening at the inferior aspect of the midline wound, no surrounding erythema Vascular: No leg edema   Portacath/PICC-without erythema  Lab Results:  Lab Results  Component Value Date   WBC 13.8 (H) 09/21/2020   HGB 12.8 (L) 09/21/2020   HCT 38.8 (L) 09/21/2020   MCV 95.8 09/21/2020   PLT 241 09/21/2020   NEUTROABS 24.8 (H) 09/19/2020    CMP  Lab Results  Component Value Date   NA 136 09/20/2020   K 4.1 09/20/2020   CL 98 09/20/2020   CO2 27 09/20/2020   GLUCOSE 153 (H) 09/20/2020   BUN 18 09/20/2020   CREATININE 1.01 09/20/2020   CALCIUM 8.2 (L) 09/20/2020   PROT 7.3 09/19/2020   ALBUMIN 3.8 09/19/2020   AST 20 09/19/2020   ALT 21 09/19/2020   ALKPHOS 68 09/19/2020   BILITOT 0.9 09/19/2020   GFRNONAA >60 09/20/2020    Lab Results  Component Value Date   CEA1 63.2 (H) 09/19/2020     Medications: I have reviewed the patient's current medications.   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  Port-A-Cath placement-Dr. Ninfa Linden 10/07/2020   Disposition: Craig Best has been diagnosed with metastatic colon cancer.  He has recovered from the colectomy procedure.  His case was presented at the GI tumor conference earlier this week.  The consensus recommendation is to proceed with systemic chemotherapy.  I reviewed results of the molecular testing with Craig Best.  He is not a candidate for immunotherapy.  I recommend beginning treatment with FOLFOX.  We reviewed potential toxicities associated with the FOLFOX regimen including the chance of nausea/vomiting, mucositis, diarrhea, alopecia, and hematologic toxicity.  We reviewed the sun sensitivity, hand/foot syndrome, and hyperpigmentation associated with 5-fluorouracil.  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  He agrees to proceed.  Craig Best will be scheduled for chemotherapy teaching class.  He will return for an office visit and cycle 1 FOLFOX on 10/28/2020.  A chemotherapy plan was entered today.  Betsy Coder, MD  10/17/2020  9:04 AM

## 2020-10-26 ENCOUNTER — Other Ambulatory Visit: Payer: Self-pay | Admitting: Oncology

## 2020-10-27 ENCOUNTER — Other Ambulatory Visit: Payer: Self-pay | Admitting: *Deleted

## 2020-10-27 MED ORDER — ONDANSETRON HCL 8 MG PO TABS: 8.0000 mg | ORAL_TABLET | Freq: Three times a day (TID) | ORAL | 1 refills | Status: DC | PRN

## 2020-10-27 NOTE — Progress Notes (Signed)
Pharmacist Chemotherapy Monitoring - Initial Assessment    Anticipated start date: 10/28/20   The following has been reviewed per standard work regarding the patient's treatment regimen: The patient's diagnosis, treatment plan and drug doses, and organ/hematologic function Lab orders and baseline tests specific to treatment regimen  The treatment plan start date, drug sequencing, and pre-medications Prior authorization status  Patient's documented medication list, including drug-drug interaction screen and prescriptions for anti-emetics and supportive care specific to the treatment regimen The drug concentrations, fluid compatibility, administration routes, and timing of the medications to be used The patient's access for treatment and lifetime cumulative dose history, if applicable  The patient's medication allergies and previous infusion related reactions, if applicable   Changes made to treatment plan:  N/A  Follow up needed:  N/A   Patrica Duel, [facility], 10/27/2020  3:37 PM

## 2020-10-28 ENCOUNTER — Inpatient Hospital Stay: Payer: Medicare Other

## 2020-10-28 ENCOUNTER — Inpatient Hospital Stay (HOSPITAL_BASED_OUTPATIENT_CLINIC_OR_DEPARTMENT_OTHER): Payer: Medicare Other | Admitting: Oncology

## 2020-10-28 ENCOUNTER — Other Ambulatory Visit: Payer: Self-pay

## 2020-10-28 VITALS — BP 150/73 | HR 81 | Temp 98.0°F | Resp 18 | Ht 68.0 in | Wt 185.6 lb

## 2020-10-28 DIAGNOSIS — C187 Malignant neoplasm of sigmoid colon: Secondary | ICD-10-CM

## 2020-10-28 DIAGNOSIS — Z5111 Encounter for antineoplastic chemotherapy: Secondary | ICD-10-CM | POA: Diagnosis not present

## 2020-10-28 LAB — CMP (CANCER CENTER ONLY)
ALT: 13 U/L (ref 0–44)
AST: 15 U/L (ref 15–41)
Albumin: 4.1 g/dL (ref 3.5–5.0)
Alkaline Phosphatase: 67 U/L (ref 38–126)
Anion gap: 8 (ref 5–15)
BUN: 20 mg/dL (ref 8–23)
CO2: 29 mmol/L (ref 22–32)
Calcium: 9.6 mg/dL (ref 8.9–10.3)
Chloride: 100 mmol/L (ref 98–111)
Creatinine: 0.74 mg/dL (ref 0.61–1.24)
GFR, Estimated: >60 mL/min (ref >60)
Glucose, Bld: 103 mg/dL — ABNORMAL HIGH (ref 70–99)
Potassium: 3.9 mmol/L (ref 3.5–5.1)
Sodium: 137 mmol/L (ref 135–145)
Total Bilirubin: 0.4 mg/dL (ref 0.3–1.2)
Total Protein: 7.4 g/dL (ref 6.5–8.1)

## 2020-10-28 LAB — CBC WITH DIFFERENTIAL (CANCER CENTER ONLY)
Abs Immature Granulocytes: 0.02 10*3/uL (ref 0.00–0.07)
Basophils Absolute: 0.1 10*3/uL (ref 0.0–0.1)
Basophils Relative: 1 %
Eosinophils Absolute: 0.1 10*3/uL (ref 0.0–0.5)
Eosinophils Relative: 1 %
HCT: 43.5 % (ref 39.0–52.0)
Hemoglobin: 14.3 g/dL (ref 13.0–17.0)
Immature Granulocytes: 0 %
Lymphocytes Relative: 13 %
Lymphs Abs: 1.4 10*3/uL (ref 0.7–4.0)
MCH: 30.2 pg (ref 26.0–34.0)
MCHC: 32.9 g/dL (ref 30.0–36.0)
MCV: 91.8 fL (ref 80.0–100.0)
Monocytes Absolute: 0.6 10*3/uL (ref 0.1–1.0)
Monocytes Relative: 6 %
Neutro Abs: 8.1 10*3/uL — ABNORMAL HIGH (ref 1.7–7.7)
Neutrophils Relative %: 79 %
Platelet Count: 435 10*3/uL — ABNORMAL HIGH (ref 150–400)
RBC: 4.74 MIL/uL (ref 4.22–5.81)
RDW: 13.6 % (ref 11.5–15.5)
WBC Count: 10.4 10*3/uL (ref 4.0–10.5)
nRBC: 0 % (ref 0.0–0.2)

## 2020-10-28 LAB — CEA (ACCESS): CEA (CHCC): 5.38 ng/mL — ABNORMAL HIGH (ref 0.00–5.00)

## 2020-10-28 MED ORDER — SODIUM CHLORIDE 0.9 % IV SOLN
5000.0000 mg | INTRAVENOUS | Status: DC
  Administered 2020-10-28: 5000 mg via INTRAVENOUS
  Filled 2020-10-28: qty 100

## 2020-10-28 MED ORDER — SODIUM CHLORIDE 0.9 % IV SOLN
10.0000 mg | Freq: Once | INTRAVENOUS | Status: AC
  Administered 2020-10-28: 10 mg via INTRAVENOUS
  Filled 2020-10-28: qty 1

## 2020-10-28 MED ORDER — PALONOSETRON HCL INJECTION 0.25 MG/5ML
0.2500 mg | Freq: Once | INTRAVENOUS | Status: AC
  Administered 2020-10-28: 0.25 mg via INTRAVENOUS
  Filled 2020-10-28: qty 5

## 2020-10-28 MED ORDER — LEUCOVORIN CALCIUM INJECTION 350 MG
400.0000 mg/m2 | Freq: Once | INTRAVENOUS | Status: AC
  Administered 2020-10-28: 800 mg via INTRAVENOUS
  Filled 2020-10-28: qty 40

## 2020-10-28 MED ORDER — FLUOROURACIL CHEMO INJECTION 2.5 GM/50ML
400.0000 mg/m2 | Freq: Once | INTRAVENOUS | Status: AC
  Administered 2020-10-28: 800 mg via INTRAVENOUS
  Filled 2020-10-28: qty 16

## 2020-10-28 MED ORDER — OXALIPLATIN CHEMO INJECTION 100 MG/20ML
85.0000 mg/m2 | Freq: Once | INTRAVENOUS | Status: AC
  Administered 2020-10-28: 170 mg via INTRAVENOUS
  Filled 2020-10-28: qty 34

## 2020-10-28 MED ORDER — DEXTROSE 5 % IV SOLN: Freq: Once | INTRAVENOUS | Status: AC

## 2020-10-28 NOTE — Patient Instructions (Signed)
[facility]  Discharge Instructions: Thank you for choosing [facility] to provide your oncology and hematology care.   If you have a lab appointment with the [facility], please go directly to the [facility] and check in at the registration area.   Wear comfortable clothing and clothing appropriate for easy access to any Portacath or PICC line.   We strive to give you quality time with your provider. You may need to reschedule your appointment if you arrive late (15 or more minutes).  Arriving late affects you and other patients whose appointments are after yours.  Also, if you miss three or more appointments without notifying the office, you may be dismissed from the clinic at the provider's discretion.      For prescription refill requests, have your pharmacy contact our office and allow 72 hours for refills to be completed.    Today you received the following chemotherapy and/or immunotherapy agents: Oxaliplatin, leucovorin, fluorouracil.       To help prevent nausea and vomiting after your treatment, we encourage you to take your nausea medication as directed.  BELOW ARE SYMPTOMS THAT SHOULD BE REPORTED IMMEDIATELY: *FEVER GREATER THAN 100.4 F (38 C) OR HIGHER *CHILLS OR SWEATING *NAUSEA AND VOMITING THAT IS NOT CONTROLLED WITH YOUR NAUSEA MEDICATION *UNUSUAL SHORTNESS OF BREATH *UNUSUAL BRUISING OR BLEEDING *URINARY PROBLEMS (pain or burning when urinating, or frequent urination) *BOWEL PROBLEMS (unusual diarrhea, constipation, pain near the anus) TENDERNESS IN MOUTH AND THROAT WITH OR WITHOUT PRESENCE OF ULCERS (sore throat, sores in mouth, or a toothache) UNUSUAL RASH, SWELLING OR PAIN  UNUSUAL VAGINAL DISCHARGE OR ITCHING   Items with * indicate a potential emergency and should be followed up as soon as possible or go to the Emergency Department if any problems should occur.  Please show the CHEMOTHERAPY ALERT CARD or IMMUNOTHERAPY  ALERT CARD at check-in to the Emergency Department and triage nurse.  Should you have questions after your visit or need to cancel or reschedule your appointment, please contact [facility]  Dept: [phone]  and follow the prompts.  Office hours are 8:00 a.m. to 4:30 p.m. Monday - Friday. Please note that voicemails left after 4:00 p.m. may not be returned until the following business day.  We are closed weekends and major holidays. You have access to a nurse at all times for urgent questions. Please call the main number to the clinic Dept: [phone] and follow the prompts.   For any non-urgent questions, you may also contact your provider using MyChart. We now offer e-Visits for anyone 20 and older to request care online for non-urgent symptoms. For details visit [email or web address].   Also download the MyChart app! Go to the app store, search "MyChart", open the app, select [facility], and log in with your MyChart username and password.  Due to Covid, a mask is required upon entering the hospital/clinic. If you do not have a mask, one will be given to you upon arrival. For doctor visits, patients may have 1 support person aged 73 or older with them. For treatment visits, patients cannot have anyone with them due to current Covid guidelines and our immunocompromised population.   Oxaliplatin Injection What is this medication? OXALIPLATIN (ox AL i PLA tin) is a chemotherapy drug. It targets fast dividing cells, like cancer cells, and causes these cells to die. This medicine is used to treat cancers of the colon and rectum, and many other cancers.  This medicine may be used for other purposes; ask your health care provider or pharmacist if you have questions. COMMON BRAND NAME(S): Eloxatin What should I tell my care team before I take this medication? They need to know if you have any of these conditions: heart disease history of irregular heartbeat liver  disease low blood counts, like white cells, platelets, or red blood cells lung or breathing disease, like asthma take medicines that treat or prevent blood clots tingling of the fingers or toes, or other nerve disorder an unusual or allergic reaction to oxaliplatin, other chemotherapy, other medicines, foods, dyes, or preservatives pregnant or trying to get pregnant breast-feeding How should I use this medication? This drug is given as an infusion into a vein. It is administered in a hospital or clinic by a specially trained health care professional. Talk to your pediatrician regarding the use of this medicine in children. Special care may be needed. Overdosage: If you think you have taken too much of this medicine contact a poison control center or emergency room at once. NOTE: This medicine is only for you. Do not share this medicine with others. What if I miss a dose? It is important not to miss a dose. Call your doctor or health care professional if you are unable to keep an appointment. What may interact with this medication? Do not take this medicine with any of the following medications: cisapride dronedarone pimozide thioridazine This medicine may also interact with the following medications: aspirin and aspirin-like medicines certain medicines that treat or prevent blood clots like warfarin, apixaban, dabigatran, and rivaroxaban cisplatin cyclosporine diuretics medicines for infection like acyclovir, adefovir, amphotericin B, bacitracin, cidofovir, foscarnet, ganciclovir, gentamicin, pentamidine, vancomycin NSAIDs, medicines for pain and inflammation, like ibuprofen or naproxen other medicines that prolong the QT interval (an abnormal heart rhythm) pamidronate zoledronic acid This list may not describe all possible interactions. Give your health care provider a list of all the medicines, herbs, non-prescription drugs, or dietary supplements you use. Also tell them if you  smoke, drink alcohol, or use illegal drugs. Some items may interact with your medicine. What should I watch for while using this medication? Your condition will be monitored carefully while you are receiving this medicine. You may need blood work done while you are taking this medicine. This medicine may make you feel generally unwell. This is not uncommon as chemotherapy can affect healthy cells as well as cancer cells. Report any side effects. Continue your course of treatment even though you feel ill unless your healthcare professional tells you to stop. This medicine can make you more sensitive to cold. Do not drink cold drinks or use ice. Cover exposed skin before coming in contact with cold temperatures or cold objects. When out in cold weather wear warm clothing and cover your mouth and nose to warm the air that goes into your lungs. Tell your doctor if you get sensitive to the cold. Do not become pregnant while taking this medicine or for 9 months after stopping it. Women should inform their health care professional if they wish to become pregnant or think they might be pregnant. Men should not father a child while taking this medicine and for 6 months after stopping it. There is potential for serious side effects to an unborn child. Talk to your health care professional for more information. Do not breast-feed a child while taking this medicine or for 3 months after stopping it. This medicine has caused ovarian failure in some women. This  medicine may make it more difficult to get pregnant. Talk to your health care professional if you are concerned about your fertility. This medicine has caused decreased sperm counts in some men. This may make it more difficult to father a child. Talk to your health care professional if you are concerned about your fertility. This medicine may increase your risk of getting an infection. Call your health care professional for advice if you get a fever, chills, or  sore throat, or other symptoms of a cold or flu. Do not treat yourself. Try to avoid being around people who are sick. Avoid taking medicines that contain aspirin, acetaminophen, ibuprofen, naproxen, or ketoprofen unless instructed by your health care professional. These medicines may hide a fever. Be careful brushing or flossing your teeth or using a toothpick because you may get an infection or bleed more easily. If you have any dental work done, tell your dentist you are receiving this medicine. What side effects may I notice from receiving this medication? Side effects that you should report to your doctor or health care professional as soon as possible: allergic reactions like skin rash, itching or hives, swelling of the face, lips, or tongue breathing problems cough low blood counts - this medicine may decrease the number of white blood cells, red blood cells, and platelets. You may be at increased risk for infections and bleeding nausea, vomiting pain, redness, or irritation at site where injected pain, tingling, numbness in the hands or feet signs and symptoms of bleeding such as bloody or black, tarry stools; red or dark brown urine; spitting up blood or brown material that looks like coffee grounds; red spots on the skin; unusual bruising or bleeding from the eyes, gums, or nose signs and symptoms of a dangerous change in heartbeat or heart rhythm like chest pain; dizziness; fast, irregular heartbeat; palpitations; feeling faint or lightheaded; falls signs and symptoms of infection like fever; chills; cough; sore throat; pain or trouble passing urine signs and symptoms of liver injury like dark yellow or brown urine; general ill feeling or flu-like symptoms; light-colored stools; loss of appetite; nausea; right upper belly pain; unusually weak or tired; yellowing of the eyes or skin signs and symptoms of low red blood cells or anemia such as unusually weak or tired; feeling faint or  lightheaded; falls signs and symptoms of muscle injury like dark urine; trouble passing urine or change in the amount of urine; unusually weak or tired; muscle pain; back pain Side effects that usually do not require medical attention (report to your doctor or health care professional if they continue or are bothersome): changes in taste diarrhea gas hair loss loss of appetite mouth sores This list may not describe all possible side effects. Call your doctor for medical advice about side effects. You may report side effects to FDA at 1-800-FDA-1088. Where should I keep my medication? This drug is given in a hospital or clinic and will not be stored at home. NOTE: This sheet is a summary. It may not cover all possible information. If you have questions about this medicine, talk to your doctor, pharmacist, or health care provider.  2022 Elsevier/Gold Standard (2018-06-14 12:20:35)  Leucovorin injection What is this medication? LEUCOVORIN (loo koe VOR in) is used to prevent or treat the harmful effects of some medicines. This medicine is used to treat anemia caused by a low amount of folic acid in the body. It is also used with 5-fluorouracil (5-FU) to treat colon cancer. This medicine  may be used for other purposes; ask your health care provider or pharmacist if you have questions. What should I tell my care team before I take this medication? They need to know if you have any of these conditions: anemia from low levels of vitamin B-12 in the blood an unusual or allergic reaction to leucovorin, folic acid, other medicines, foods, dyes, or preservatives pregnant or trying to get pregnant breast-feeding How should I use this medication? This medicine is for injection into a muscle or into a vein. It is given by a health care professional in a hospital or clinic setting. Talk to your pediatrician regarding the use of this medicine in children. Special care may be needed. Overdosage: If you  think you have taken too much of this medicine contact a poison control center or emergency room at once. NOTE: This medicine is only for you. Do not share this medicine with others. What if I miss a dose? This does not apply. What may interact with this medication? capecitabine fluorouracil phenobarbital phenytoin primidone trimethoprim-sulfamethoxazole This list may not describe all possible interactions. Give your health care provider a list of all the medicines, herbs, non-prescription drugs, or dietary supplements you use. Also tell them if you smoke, drink alcohol, or use illegal drugs. Some items may interact with your medicine. What should I watch for while using this medication? Your condition will be monitored carefully while you are receiving this medicine. This medicine may increase the side effects of 5-fluorouracil, 5-FU. Tell your doctor or health care professional if you have diarrhea or mouth sores that do not get better or that get worse. What side effects may I notice from receiving this medication? Side effects that you should report to your doctor or health care professional as soon as possible: allergic reactions like skin rash, itching or hives, swelling of the face, lips, or tongue breathing problems fever, infection mouth sores unusual bleeding or bruising unusually weak or tired Side effects that usually do not require medical attention (report to your doctor or health care professional if they continue or are bothersome): constipation or diarrhea loss of appetite nausea, vomiting This list may not describe all possible side effects. Call your doctor for medical advice about side effects. You may report side effects to FDA at 1-800-FDA-1088. Where should I keep my medication? This drug is given in a hospital or clinic and will not be stored at home. NOTE: This sheet is a summary. It may not cover all possible information. If you have questions about this  medicine, talk to your doctor, pharmacist, or health care provider.  2022 Elsevier/Gold Standard (2007-08-01 16:50:29)  Fluorouracil, 5-FU injection What is this medication? FLUOROURACIL, 5-FU (flure oh YOOR a sil) is a chemotherapy drug. It slows the growth of cancer cells. This medicine is used to treat many types of cancer like breast cancer, colon or rectal cancer, pancreatic cancer, and stomach cancer. This medicine may be used for other purposes; ask your health care provider or pharmacist if you have questions. COMMON BRAND NAME(S): Adrucil What should I tell my care team before I take this medication? They need to know if you have any of these conditions: blood disorders dihydropyrimidine dehydrogenase (DPD) deficiency infection (especially a virus infection such as chickenpox, cold sores, or herpes) kidney disease liver disease malnourished, poor nutrition recent or ongoing radiation therapy an unusual or allergic reaction to fluorouracil, other chemotherapy, other medicines, foods, dyes, or preservatives pregnant or trying to get pregnant breast-feeding How should  I use this medication? This drug is given as an infusion or injection into a vein. It is administered in a hospital or clinic by a specially trained health care professional. Talk to your pediatrician regarding the use of this medicine in children. Special care may be needed. Overdosage: If you think you have taken too much of this medicine contact a poison control center or emergency room at once. NOTE: This medicine is only for you. Do not share this medicine with others. What if I miss a dose? It is important not to miss your dose. Call your doctor or health care professional if you are unable to keep an appointment. What may interact with this medication? Do not take this medicine with any of the following medications: live virus vaccines This medicine may also interact with the following medications: medicines  that treat or prevent blood clots like warfarin, enoxaparin, and dalteparin This list may not describe all possible interactions. Give your health care provider a list of all the medicines, herbs, non-prescription drugs, or dietary supplements you use. Also tell them if you smoke, drink alcohol, or use illegal drugs. Some items may interact with your medicine. What should I watch for while using this medication? Visit your doctor for checks on your progress. This drug may make you feel generally unwell. This is not uncommon, as chemotherapy can affect healthy cells as well as cancer cells. Report any side effects. Continue your course of treatment even though you feel ill unless your doctor tells you to stop. In some cases, you may be given additional medicines to help with side effects. Follow all directions for their use. Call your doctor or health care professional for advice if you get a fever, chills or sore throat, or other symptoms of a cold or flu. Do not treat yourself. This drug decreases your body's ability to fight infections. Try to avoid being around people who are sick. This medicine may increase your risk to bruise or bleed. Call your doctor or health care professional if you notice any unusual bleeding. Be careful brushing and flossing your teeth or using a toothpick because you may get an infection or bleed more easily. If you have any dental work done, tell your dentist you are receiving this medicine. Avoid taking products that contain aspirin, acetaminophen, ibuprofen, naproxen, or ketoprofen unless instructed by your doctor. These medicines may hide a fever. Do not become pregnant while taking this medicine. Women should inform their doctor if they wish to become pregnant or think they might be pregnant. There is a potential for serious side effects to an unborn child. Talk to your health care professional or pharmacist for more information. Do not breast-feed an infant while taking  this medicine. Men should inform their doctor if they wish to father a child. This medicine may lower sperm counts. Do not treat diarrhea with over the counter products. Contact your doctor if you have diarrhea that lasts more than 2 days or if it is severe and watery. This medicine can make you more sensitive to the sun. Keep out of the sun. If you cannot avoid being in the sun, wear protective clothing and use sunscreen. Do not use sun lamps or tanning beds/booths. What side effects may I notice from receiving this medication? Side effects that you should report to your doctor or health care professional as soon as possible: allergic reactions like skin rash, itching or hives, swelling of the face, lips, or tongue low blood counts - this  medicine may decrease the number of white blood cells, red blood cells and platelets. You may be at increased risk for infections and bleeding. signs of infection - fever or chills, cough, sore throat, pain or difficulty passing urine signs of decreased platelets or bleeding - bruising, pinpoint red spots on the skin, black, tarry stools, blood in the urine signs of decreased red blood cells - unusually weak or tired, fainting spells, lightheadedness breathing problems changes in vision chest pain mouth sores nausea and vomiting pain, swelling, redness at site where injected pain, tingling, numbness in the hands or feet redness, swelling, or sores on hands or feet stomach pain unusual bleeding Side effects that usually do not require medical attention (report to your doctor or health care professional if they continue or are bothersome): changes in finger or toe nails diarrhea dry or itchy skin hair loss headache loss of appetite sensitivity of eyes to the light stomach upset unusually teary eyes This list may not describe all possible side effects. Call your doctor for medical advice about side effects. You may report side effects to FDA at  1-800-FDA-1088. Where should I keep my medication? This drug is given in a hospital or clinic and will not be stored at home. NOTE: This sheet is a summary. It may not cover all possible information. If you have questions about this medicine, talk to your doctor, pharmacist, or health care provider.  2022 Elsevier/Gold Standard (2018-12-26 15:00:03)

## 2020-10-28 NOTE — Progress Notes (Signed)
  [facility] OFFICE PROGRESS NOTE   Diagnosis: Colon cancer  INTERVAL HISTORY:   Craig Best returns as scheduled.  He feels well.  Good appetite.  He has been working on his farm.  There is a small amount of drainage from 2 areas of the midline wound.  He attended a chemotherapy teaching class earlier today.  Objective:  Vital signs in last 24 hours:  Blood pressure (!) 150/73, pulse 81, temperature 98 F (36.7 C), temperature source Oral, resp. rate 18, height _0  (1.727 m), weight 185 lb 9.6 oz (84.2 kg), SpO2 98 %.    Resp: Lungs clear bilaterally Cardio: Regular rate and rhythm GI: No hepatosplenomegaly, the midline wound has closed, 2 small areas with an overlying eschar and no drainage at present.  Left lower quadrant colostomy Vascular: No leg edema  Portacath/PICC-without erythema  Lab Results:  Lab Results  Component Value Date   WBC 10.4 10/28/2020   HGB 14.3 10/28/2020   HCT 43.5 10/28/2020   MCV 91.8 10/28/2020   PLT 435 (H) 10/28/2020   NEUTROABS 8.1 (H) 10/28/2020    CMP  Lab Results  Component Value Date   NA 137 10/28/2020   K 3.9 10/28/2020   CL 100 10/28/2020   CO2 29 10/28/2020   GLUCOSE 103 (H) 10/28/2020   BUN 20 10/28/2020   CREATININE 0.74 10/28/2020   CALCIUM 9.6 10/28/2020   PROT 7.4 10/28/2020   ALBUMIN 4.1 10/28/2020   AST 15 10/28/2020   ALT 13 10/28/2020   ALKPHOS 67 10/28/2020   BILITOT 0.4 10/28/2020   GFRNONAA >60 10/28/2020    Lab Results  Component Value Date   CEA1 63.2 (H) 09/19/2020     Medications: I have reviewed the patient's current medications.   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  Port-A-Cath placement-Dr. Ninfa Linden 10/07/2020     Disposition: [name] appears stable.  The plan is to begin FOLFOX chemotherapy today.  He attended a chemotherapy teaching class earlier today.  He will return for an office visit and cycle 2 FOLFOX in 2 weeks.  Betsy Coder, MD  10/28/2020  10:10 AM

## 2020-10-28 NOTE — Progress Notes (Signed)
Patient presents for treatment. RN assessment completed along with the following:  Treatment Conditions (labs/vitals/weight) reviewed - Yes, and within treatment parameters.   Oncology Treatment Attestation completed for current therapy- Yes, on date 10/17/20 Informed consent completed and reflects current therapy/intent - Yes, on date 10/28/20             Provider progress note reviewed - Today's provider note is not yet available. I reviewed the most recent oncology provider progress note in chart dated 10/17/20. Treatment/Antibody/Supportive plan reviewed - Yes, and there are no adjustments needed for today's treatment. S&H and other orders reviewed - Yes, and there are no additional orders identified. Previous treatment date reviewed - Yes, and the appropriate amount of time has elapsed between treatments.   Patient to proceed with treatment.

## 2020-10-28 NOTE — Patient Instructions (Signed)
Implanted Port Home Guide An implanted port is a device that is placed under the skin. It is usually placed in the chest. The device can be used to give IV medicine, to take blood, or for dialysis. You may have an implanted port if: You need IV medicine that would be irritating to the small veins in your hands or arms. You need IV medicines, such as antibiotics, for a long period of time. You need IV nutrition for a long period of time. You need dialysis. When you have a port, your health care provider can choose to use the port instead of veins in your arms for these procedures. You may have fewer limitations when using a port than you would if you used other types of long-term IVs, and you will likely be able to return to normal activities after your incision heals. An implanted port has two main parts: Reservoir. The reservoir is the part where a needle is inserted to give medicines or draw blood. The reservoir is round. After it is placed, it appears as a small, raised area under your skin. Catheter. The catheter is a thin, flexible tube that connects the reservoir to a vein. Medicine that is inserted into the reservoir goes into the catheter and then into the vein. How is my port accessed? To access your port: A numbing cream may be placed on the skin over the port site. Your health care provider will put on a mask and sterile gloves. The skin over your port will be cleaned carefully with a germ-killing soap and allowed to dry. Your health care provider will gently pinch the port and insert a needle into it. Your health care provider will check for a blood return to make sure the port is in the vein and is not clogged. If your port needs to remain accessed to get medicine continuously (constant infusion), your health care provider will place a clear bandage (dressing) over the needle site. The dressing and needle will need to be changed every week, or as told by your health care provider. What  is flushing? Flushing helps keep the port from getting clogged. Follow instructions from your health care provider about how and when to flush the port. Ports are usually flushed with saline solution or a medicine called heparin. The need for flushing will depend on how the port is used: If the port is only used from time to time to give medicines or draw blood, the port may need to be flushed: Before and after medicines have been given. Before and after blood has been drawn. As part of routine maintenance. Flushing may be recommended every 4-6 weeks. If a constant infusion is running, the port may not need to be flushed. Throw away any syringes in a disposal container that is meant for sharp items (sharps container). You can buy a sharps container from a pharmacy, or you can make one by using an empty hard plastic bottle with a cover. How long will my port stay implanted? The port can stay in for as long as your health care provider thinks it is needed. When it is time for the port to come out, a surgery will be done to remove it. The surgery will be similar to the procedure that was done to put the port in. Follow these instructions at home:  Flush your port as told by your health care provider. If you need an infusion over several days, follow instructions from your health care provider about how   to take care of your port site. Make sure you: Wash your hands with soap and water before you change your dressing. If soap and water are not available, use alcohol-based hand sanitizer. Change your dressing as told by your health care provider. Place any used dressings or infusion bags into a plastic bag. Throw that bag in the trash. Keep the dressing that covers the needle clean and dry. Do not get it wet. Do not use scissors or sharp objects near the tube. Keep the tube clamped, unless it is being used. Check your port site every day for signs of infection. Check for: Redness, swelling, or  pain. Fluid or blood. Pus or a bad smell. Protect the skin around the port site. Avoid wearing bra straps that rub or irritate the site. Protect the skin around your port from seat belts. Place a soft pad over your chest if needed. Bathe or shower as told by your health care provider. The site may get wet as long as you are not actively receiving an infusion. Return to your normal activities as told by your health care provider. Ask your health care provider what activities are safe for you. Carry a medical alert card or wear a medical alert bracelet at all times. This will let health care providers know that you have an implanted port in case of an emergency. Get help right away if: You have redness, swelling, or pain at the port site. You have fluid or blood coming from your port site. You have pus or a bad smell coming from the port site. You have a fever. Summary Implanted ports are usually placed in the chest for long-term IV access. Follow instructions from your health care provider about flushing the port and changing bandages (dressings). Take care of the area around your port by avoiding clothing that puts pressure on the area, and by watching for signs of infection. Protect the skin around your port from seat belts. Place a soft pad over your chest if needed. Get help right away if you have a fever or you have redness, swelling, pain, drainage, or a bad smell at the port site. This information is not intended to replace advice given to you by your health care provider. Make sure you discuss any questions you have with your health care provider. Document Revised: 04/16/2020 Document Reviewed: 06/11/2019 [employer] Patient Education  2022 [employer]  

## 2020-10-28 NOTE — Progress Notes (Signed)
Patient seen by Dr. Benay Spice today  Labs reviewed by Dr. Faye Ramsay are within treatment parameters.  Vitals are within treatment parameters.  Per physician team, patient is ready for treatment.

## 2020-10-29 ENCOUNTER — Telehealth: Payer: Self-pay

## 2020-10-29 NOTE — Telephone Encounter (Signed)
Called patient for first time chemo follow-up phone call and spoke to patient's significant other Craig Best) per patient's request.  Craig Best stated patient was doing well.  She did report him having "indigestion" yesterday evening; patient took a nausea pill and an antacid which resolved the indigestion.  Advised Craig Best that this was the correct thing to do.  Reminded Craig Best of patient's pump stop appointment on 10/30/20 at 12pm and instructed her to call office with any additional questions or concerns.  Craig Best verbalized understanding and agreed to relay information to patient.

## 2020-10-30 ENCOUNTER — Inpatient Hospital Stay: Payer: Medicare Other

## 2020-10-30 ENCOUNTER — Other Ambulatory Visit: Payer: Self-pay

## 2020-10-30 VITALS — BP 158/82 | HR 79 | Temp 98.2°F | Resp 18

## 2020-10-30 DIAGNOSIS — Z5111 Encounter for antineoplastic chemotherapy: Secondary | ICD-10-CM | POA: Diagnosis not present

## 2020-10-30 DIAGNOSIS — C187 Malignant neoplasm of sigmoid colon: Secondary | ICD-10-CM

## 2020-10-30 MED ORDER — SODIUM CHLORIDE 0.9% FLUSH
10.0000 mL | INTRAVENOUS | Status: DC | PRN
Start: 2020-10-30 — End: 2020-10-30
  Administered 2020-10-30: 10 mL

## 2020-10-30 MED ORDER — HEPARIN SOD (PORK) LOCK FLUSH 100 UNIT/ML IV SOLN
500.0000 [IU] | Freq: Once | INTRAVENOUS | Status: AC | PRN
  Administered 2020-10-30: 500 [IU]

## 2020-11-09 ENCOUNTER — Other Ambulatory Visit: Payer: Self-pay | Admitting: Oncology

## 2020-11-11 ENCOUNTER — Inpatient Hospital Stay: Payer: Medicare Other

## 2020-11-11 ENCOUNTER — Inpatient Hospital Stay: Payer: Medicare Other | Attending: Oncology

## 2020-11-11 ENCOUNTER — Inpatient Hospital Stay (HOSPITAL_BASED_OUTPATIENT_CLINIC_OR_DEPARTMENT_OTHER): Payer: Medicare Other | Admitting: Nurse Practitioner

## 2020-11-11 ENCOUNTER — Other Ambulatory Visit: Payer: Self-pay

## 2020-11-11 ENCOUNTER — Encounter: Payer: Self-pay | Admitting: Nurse Practitioner

## 2020-11-11 VITALS — BP 160/67 | HR 71 | Temp 97.8°F | Resp 18 | Ht 68.0 in | Wt 187.8 lb

## 2020-11-11 DIAGNOSIS — C7802 Secondary malignant neoplasm of left lung: Secondary | ICD-10-CM | POA: Insufficient documentation

## 2020-11-11 DIAGNOSIS — C187 Malignant neoplasm of sigmoid colon: Secondary | ICD-10-CM

## 2020-11-11 DIAGNOSIS — Z5111 Encounter for antineoplastic chemotherapy: Secondary | ICD-10-CM | POA: Diagnosis not present

## 2020-11-11 DIAGNOSIS — Z452 Encounter for adjustment and management of vascular access device: Secondary | ICD-10-CM | POA: Insufficient documentation

## 2020-11-11 DIAGNOSIS — C7801 Secondary malignant neoplasm of right lung: Secondary | ICD-10-CM | POA: Insufficient documentation

## 2020-11-11 LAB — CMP (CANCER CENTER ONLY)
ALT: 11 U/L (ref 0–44)
AST: 15 U/L (ref 15–41)
Albumin: 4.2 g/dL (ref 3.5–5.0)
Alkaline Phosphatase: 70 U/L (ref 38–126)
Anion gap: 9 (ref 5–15)
BUN: 13 mg/dL (ref 8–23)
CO2: 30 mmol/L (ref 22–32)
Calcium: 9.6 mg/dL (ref 8.9–10.3)
Chloride: 101 mmol/L (ref 98–111)
Creatinine: 0.74 mg/dL (ref 0.61–1.24)
GFR, Estimated: >60 mL/min (ref >60)
Glucose, Bld: 112 mg/dL — ABNORMAL HIGH (ref 70–99)
Potassium: 3.8 mmol/L (ref 3.5–5.1)
Sodium: 140 mmol/L (ref 135–145)
Total Bilirubin: 0.5 mg/dL (ref 0.3–1.2)
Total Protein: 7.2 g/dL (ref 6.5–8.1)

## 2020-11-11 LAB — CBC WITH DIFFERENTIAL (CANCER CENTER ONLY)
Abs Immature Granulocytes: 0.01 10*3/uL (ref 0.00–0.07)
Basophils Absolute: 0.1 10*3/uL (ref 0.0–0.1)
Basophils Relative: 2 %
Eosinophils Absolute: 0.2 10*3/uL (ref 0.0–0.5)
Eosinophils Relative: 4 %
HCT: 42 % (ref 39.0–52.0)
Hemoglobin: 14.2 g/dL (ref 13.0–17.0)
Immature Granulocytes: 0 %
Lymphocytes Relative: 24 %
Lymphs Abs: 1.4 10*3/uL (ref 0.7–4.0)
MCH: 30.4 pg (ref 26.0–34.0)
MCHC: 33.8 g/dL (ref 30.0–36.0)
MCV: 89.9 fL (ref 80.0–100.0)
Monocytes Absolute: 0.7 10*3/uL (ref 0.1–1.0)
Monocytes Relative: 12 %
Neutro Abs: 3.4 10*3/uL (ref 1.7–7.7)
Neutrophils Relative %: 58 %
Platelet Count: 260 10*3/uL (ref 150–400)
RBC: 4.67 MIL/uL (ref 4.22–5.81)
RDW: 13.8 % (ref 11.5–15.5)
WBC Count: 5.8 10*3/uL (ref 4.0–10.5)
nRBC: 0 % (ref 0.0–0.2)

## 2020-11-11 LAB — CEA (ACCESS): CEA (CHCC): 5.38 ng/mL — ABNORMAL HIGH (ref 0.00–5.00)

## 2020-11-11 MED ORDER — OXALIPLATIN CHEMO INJECTION 100 MG/20ML
85.0000 mg/m2 | Freq: Once | INTRAVENOUS | Status: AC
  Administered 2020-11-11: 170 mg via INTRAVENOUS
  Filled 2020-11-11: qty 34

## 2020-11-11 MED ORDER — FLUOROURACIL CHEMO INJECTION 2.5 GM/50ML
400.0000 mg/m2 | Freq: Once | INTRAVENOUS | Status: AC
  Administered 2020-11-11: 800 mg via INTRAVENOUS
  Filled 2020-11-11: qty 16

## 2020-11-11 MED ORDER — SODIUM CHLORIDE 0.9 % IV SOLN
5000.0000 mg | INTRAVENOUS | Status: DC
  Administered 2020-11-11: 5000 mg via INTRAVENOUS
  Filled 2020-11-11: qty 100

## 2020-11-11 MED ORDER — DEXTROSE 5 % IV SOLN: Freq: Once | INTRAVENOUS | Status: AC

## 2020-11-11 MED ORDER — LEUCOVORIN CALCIUM INJECTION 350 MG
400.0000 mg/m2 | Freq: Once | INTRAVENOUS | Status: AC
  Administered 2020-11-11: 800 mg via INTRAVENOUS
  Filled 2020-11-11: qty 40

## 2020-11-11 MED ORDER — PALONOSETRON HCL INJECTION 0.25 MG/5ML
0.2500 mg | Freq: Once | INTRAVENOUS | Status: AC
  Administered 2020-11-11: 0.25 mg via INTRAVENOUS
  Filled 2020-11-11: qty 5

## 2020-11-11 MED ORDER — SODIUM CHLORIDE 0.9 % IV SOLN
10.0000 mg | Freq: Once | INTRAVENOUS | Status: AC
  Administered 2020-11-11: 10 mg via INTRAVENOUS
  Filled 2020-11-11: qty 1

## 2020-11-11 NOTE — Progress Notes (Signed)
Patient presents for treatment. RN assessment completed along with the following:  Labs/vitals reviewed - Yes, and within treatment parameters.   Weight within 10% of previous measurement - Yes Oncology Treatment Attestation completed for current therapy- Yes, on date 10/17/2020 Informed consent completed and reflects current therapy/intent - Yes, on date 10/28/2020             Provider progress note reviewed - Today's provider note is not yet available. I reviewed the most recent oncology provider progress note in chart dated 10/28/2020. Treatment/Antibody/Supportive plan reviewed - Yes, and there are no adjustments needed for today's treatment. S&H and other orders reviewed - Yes, and there are no additional orders identified. Previous treatment date reviewed - Yes, and the appropriate amount of time has elapsed between treatments. Clinic Hand Off Received from - Yes, from Lattie Haw, NP  Patient to proceed with treatment.

## 2020-11-11 NOTE — Patient Instructions (Signed)
[place]   Discharge Instructions: Thank you for choosing [facility] to provide your oncology and hematology care.   If you have a lab appointment with the [facility], please go directly to the [facility] and check in at the registration area.   Wear comfortable clothing and clothing appropriate for easy access to any Portacath or PICC line.   We strive to give you quality time with your provider. You may need to reschedule your appointment if you arrive late (15 or more minutes).  Arriving late affects you and other patients whose appointments are after yours.  Also, if you miss three or more appointments without notifying the office, you may be dismissed from the clinic at the provider's discretion.      For prescription refill requests, have your pharmacy contact our office and allow 72 hours for refills to be completed.    Today you received the following chemotherapy and/or immunotherapy agents Oxaliplatin (ELOXATIN), Leucovorin & Flourouracil (ADRUCIL).      To help prevent nausea and vomiting after your treatment, we encourage you to take your nausea medication as directed.  BELOW ARE SYMPTOMS THAT SHOULD BE REPORTED IMMEDIATELY: *FEVER GREATER THAN 100.4 F (38 C) OR HIGHER *CHILLS OR SWEATING *NAUSEA AND VOMITING THAT IS NOT CONTROLLED WITH YOUR NAUSEA MEDICATION *UNUSUAL SHORTNESS OF BREATH *UNUSUAL BRUISING OR BLEEDING *URINARY PROBLEMS (pain or burning when urinating, or frequent urination) *BOWEL PROBLEMS (unusual diarrhea, constipation, pain near the anus) TENDERNESS IN MOUTH AND THROAT WITH OR WITHOUT PRESENCE OF ULCERS (sore throat, sores in mouth, or a toothache) UNUSUAL RASH, SWELLING OR PAIN  UNUSUAL VAGINAL DISCHARGE OR ITCHING   Items with * indicate a potential emergency and should be followed up as soon as possible or go to the Emergency Department if any problems should occur.  Please show the CHEMOTHERAPY ALERT  CARD or IMMUNOTHERAPY ALERT CARD at check-in to the Emergency Department and triage nurse.  Should you have questions after your visit or need to cancel or reschedule your appointment, please contact [facility]  Dept: [phone]  and follow the prompts.  Office hours are 8:00 a.m. to 4:30 p.m. Monday - Friday. Please note that voicemails left after 4:00 p.m. may not be returned until the following business day.  We are closed weekends and major holidays. You have access to a nurse at all times for urgent questions. Please call the main number to the clinic Dept: [phone] and follow the prompts.   For any non-urgent questions, you may also contact your provider using MyChart. We now offer e-Visits for anyone 34 and older to request care online for non-urgent symptoms. For details visit [email or web address].   Also download the MyChart app! Go to the app store, search "MyChart", open the app, select [facility], and log in with your MyChart username and password.  Due to Covid, a mask is required upon entering the hospital/clinic. If you do not have a mask, one will be given to you upon arrival. For doctor visits, patients may have 1 support person aged 67 or older with them. For treatment visits, patients cannot have anyone with them due to current Covid guidelines and our immunocompromised population.   Oxaliplatin Injection What is this medication? OXALIPLATIN (ox AL i PLA tin) is a chemotherapy drug. It targets fast dividing cells, like cancer cells, and causes these cells to die. This medicine is used to treat cancers of the colon and rectum, and  many other cancers. This medicine may be used for other purposes; ask your health care provider or pharmacist if you have questions. COMMON BRAND NAME(S): Eloxatin What should I tell my care team before I take this medication? They need to know if you have any of these conditions: heart disease history of irregular  heartbeat liver disease low blood counts, like white cells, platelets, or red blood cells lung or breathing disease, like asthma take medicines that treat or prevent blood clots tingling of the fingers or toes, or other nerve disorder an unusual or allergic reaction to oxaliplatin, other chemotherapy, other medicines, foods, dyes, or preservatives pregnant or trying to get pregnant breast-feeding How should I use this medication? This drug is given as an infusion into a vein. It is administered in a hospital or clinic by a specially trained health care professional. Talk to your pediatrician regarding the use of this medicine in children. Special care may be needed. Overdosage: If you think you have taken too much of this medicine contact a poison control center or emergency room at once. NOTE: This medicine is only for you. Do not share this medicine with others. What if I miss a dose? It is important not to miss a dose. Call your doctor or health care professional if you are unable to keep an appointment. What may interact with this medication? Do not take this medicine with any of the following medications: cisapride dronedarone pimozide thioridazine This medicine may also interact with the following medications: aspirin and aspirin-like medicines certain medicines that treat or prevent blood clots like warfarin, apixaban, dabigatran, and rivaroxaban cisplatin cyclosporine diuretics medicines for infection like acyclovir, adefovir, amphotericin B, bacitracin, cidofovir, foscarnet, ganciclovir, gentamicin, pentamidine, vancomycin NSAIDs, medicines for pain and inflammation, like ibuprofen or naproxen other medicines that prolong the QT interval (an abnormal heart rhythm) pamidronate zoledronic acid This list may not describe all possible interactions. Give your health care provider a list of all the medicines, herbs, non-prescription drugs, or dietary supplements you use. Also tell  them if you smoke, drink alcohol, or use illegal drugs. Some items may interact with your medicine. What should I watch for while using this medication? Your condition will be monitored carefully while you are receiving this medicine. You may need blood work done while you are taking this medicine. This medicine may make you feel generally unwell. This is not uncommon as chemotherapy can affect healthy cells as well as cancer cells. Report any side effects. Continue your course of treatment even though you feel ill unless your healthcare professional tells you to stop. This medicine can make you more sensitive to cold. Do not drink cold drinks or use ice. Cover exposed skin before coming in contact with cold temperatures or cold objects. When out in cold weather wear warm clothing and cover your mouth and nose to warm the air that goes into your lungs. Tell your doctor if you get sensitive to the cold. Do not become pregnant while taking this medicine or for 9 months after stopping it. Women should inform their health care professional if they wish to become pregnant or think they might be pregnant. Men should not father a child while taking this medicine and for 6 months after stopping it. There is potential for serious side effects to an unborn child. Talk to your health care professional for more information. Do not breast-feed a child while taking this medicine or for 3 months after stopping it. This medicine has caused ovarian failure in  some women. This medicine may make it more difficult to get pregnant. Talk to your health care professional if you are concerned about your fertility. This medicine has caused decreased sperm counts in some men. This may make it more difficult to father a child. Talk to your health care professional if you are concerned about your fertility. This medicine may increase your risk of getting an infection. Call your health care professional for advice if you get a fever,  chills, or sore throat, or other symptoms of a cold or flu. Do not treat yourself. Try to avoid being around people who are sick. Avoid taking medicines that contain aspirin, acetaminophen, ibuprofen, naproxen, or ketoprofen unless instructed by your health care professional. These medicines may hide a fever. Be careful brushing or flossing your teeth or using a toothpick because you may get an infection or bleed more easily. If you have any dental work done, tell your dentist you are receiving this medicine. What side effects may I notice from receiving this medication? Side effects that you should report to your doctor or health care professional as soon as possible: allergic reactions like skin rash, itching or hives, swelling of the face, lips, or tongue breathing problems cough low blood counts - this medicine may decrease the number of white blood cells, red blood cells, and platelets. You may be at increased risk for infections and bleeding nausea, vomiting pain, redness, or irritation at site where injected pain, tingling, numbness in the hands or feet signs and symptoms of bleeding such as bloody or black, tarry stools; red or dark brown urine; spitting up blood or brown material that looks like coffee grounds; red spots on the skin; unusual bruising or bleeding from the eyes, gums, or nose signs and symptoms of a dangerous change in heartbeat or heart rhythm like chest pain; dizziness; fast, irregular heartbeat; palpitations; feeling faint or lightheaded; falls signs and symptoms of infection like fever; chills; cough; sore throat; pain or trouble passing urine signs and symptoms of liver injury like dark yellow or brown urine; general ill feeling or flu-like symptoms; light-colored stools; loss of appetite; nausea; right upper belly pain; unusually weak or tired; yellowing of the eyes or skin signs and symptoms of low red blood cells or anemia such as unusually weak or tired; feeling faint  or lightheaded; falls signs and symptoms of muscle injury like dark urine; trouble passing urine or change in the amount of urine; unusually weak or tired; muscle pain; back pain Side effects that usually do not require medical attention (report to your doctor or health care professional if they continue or are bothersome): changes in taste diarrhea gas hair loss loss of appetite mouth sores This list may not describe all possible side effects. Call your doctor for medical advice about side effects. You may report side effects to FDA at 1-800-FDA-1088. Where should I keep my medication? This drug is given in a hospital or clinic and will not be stored at home. NOTE: This sheet is a summary. It may not cover all possible information. If you have questions about this medicine, talk to your doctor, pharmacist, or health care provider.  2022 Elsevier/Gold Standard (2018-06-14 12:20:35)  Leucovorin injection What is this medication? LEUCOVORIN (loo koe VOR in) is used to prevent or treat the harmful effects of some medicines. This medicine is used to treat anemia caused by a low amount of folic acid in the body. It is also used with 5-fluorouracil (5-FU) to treat colon  cancer. This medicine may be used for other purposes; ask your health care provider or pharmacist if you have questions. What should I tell my care team before I take this medication? They need to know if you have any of these conditions: anemia from low levels of vitamin B-12 in the blood an unusual or allergic reaction to leucovorin, folic acid, other medicines, foods, dyes, or preservatives pregnant or trying to get pregnant breast-feeding How should I use this medication? This medicine is for injection into a muscle or into a vein. It is given by a health care professional in a hospital or clinic setting. Talk to your pediatrician regarding the use of this medicine in children. Special care may be needed. Overdosage: If you  think you have taken too much of this medicine contact a poison control center or emergency room at once. NOTE: This medicine is only for you. Do not share this medicine with others. What if I miss a dose? This does not apply. What may interact with this medication? capecitabine fluorouracil phenobarbital phenytoin primidone trimethoprim-sulfamethoxazole This list may not describe all possible interactions. Give your health care provider a list of all the medicines, herbs, non-prescription drugs, or dietary supplements you use. Also tell them if you smoke, drink alcohol, or use illegal drugs. Some items may interact with your medicine. What should I watch for while using this medication? Your condition will be monitored carefully while you are receiving this medicine. This medicine may increase the side effects of 5-fluorouracil, 5-FU. Tell your doctor or health care professional if you have diarrhea or mouth sores that do not get better or that get worse. What side effects may I notice from receiving this medication? Side effects that you should report to your doctor or health care professional as soon as possible: allergic reactions like skin rash, itching or hives, swelling of the face, lips, or tongue breathing problems fever, infection mouth sores unusual bleeding or bruising unusually weak or tired Side effects that usually do not require medical attention (report to your doctor or health care professional if they continue or are bothersome): constipation or diarrhea loss of appetite nausea, vomiting This list may not describe all possible side effects. Call your doctor for medical advice about side effects. You may report side effects to FDA at 1-800-FDA-1088. Where should I keep my medication? This drug is given in a hospital or clinic and will not be stored at home. NOTE: This sheet is a summary. It may not cover all possible information. If you have questions about this  medicine, talk to your doctor, pharmacist, or health care provider.  2022 Elsevier/Gold Standard (2007-08-01 16:50:29)  Fluorouracil, 5-FU injection What is this medication? FLUOROURACIL, 5-FU (flure oh YOOR a sil) is a chemotherapy drug. It slows the growth of cancer cells. This medicine is used to treat many types of cancer like breast cancer, colon or rectal cancer, pancreatic cancer, and stomach cancer. This medicine may be used for other purposes; ask your health care provider or pharmacist if you have questions. COMMON BRAND NAME(S): Adrucil What should I tell my care team before I take this medication? They need to know if you have any of these conditions: blood disorders dihydropyrimidine dehydrogenase (DPD) deficiency infection (especially a virus infection such as chickenpox, cold sores, or herpes) kidney disease liver disease malnourished, poor nutrition recent or ongoing radiation therapy an unusual or allergic reaction to fluorouracil, other chemotherapy, other medicines, foods, dyes, or preservatives pregnant or trying to get pregnant  breast-feeding How should I use this medication? This drug is given as an infusion or injection into a vein. It is administered in a hospital or clinic by a specially trained health care professional. Talk to your pediatrician regarding the use of this medicine in children. Special care may be needed. Overdosage: If you think you have taken too much of this medicine contact a poison control center or emergency room at once. NOTE: This medicine is only for you. Do not share this medicine with others. What if I miss a dose? It is important not to miss your dose. Call your doctor or health care professional if you are unable to keep an appointment. What may interact with this medication? Do not take this medicine with any of the following medications: live virus vaccines This medicine may also interact with the following medications: medicines  that treat or prevent blood clots like warfarin, enoxaparin, and dalteparin This list may not describe all possible interactions. Give your health care provider a list of all the medicines, herbs, non-prescription drugs, or dietary supplements you use. Also tell them if you smoke, drink alcohol, or use illegal drugs. Some items may interact with your medicine. What should I watch for while using this medication? Visit your doctor for checks on your progress. This drug may make you feel generally unwell. This is not uncommon, as chemotherapy can affect healthy cells as well as cancer cells. Report any side effects. Continue your course of treatment even though you feel ill unless your doctor tells you to stop. In some cases, you may be given additional medicines to help with side effects. Follow all directions for their use. Call your doctor or health care professional for advice if you get a fever, chills or sore throat, or other symptoms of a cold or flu. Do not treat yourself. This drug decreases your body's ability to fight infections. Try to avoid being around people who are sick. This medicine may increase your risk to bruise or bleed. Call your doctor or health care professional if you notice any unusual bleeding. Be careful brushing and flossing your teeth or using a toothpick because you may get an infection or bleed more easily. If you have any dental work done, tell your dentist you are receiving this medicine. Avoid taking products that contain aspirin, acetaminophen, ibuprofen, naproxen, or ketoprofen unless instructed by your doctor. These medicines may hide a fever. Do not become pregnant while taking this medicine. Women should inform their doctor if they wish to become pregnant or think they might be pregnant. There is a potential for serious side effects to an unborn child. Talk to your health care professional or pharmacist for more information. Do not breast-feed an infant while taking  this medicine. Men should inform their doctor if they wish to father a child. This medicine may lower sperm counts. Do not treat diarrhea with over the counter products. Contact your doctor if you have diarrhea that lasts more than 2 days or if it is severe and watery. This medicine can make you more sensitive to the sun. Keep out of the sun. If you cannot avoid being in the sun, wear protective clothing and use sunscreen. Do not use sun lamps or tanning beds/booths. What side effects may I notice from receiving this medication? Side effects that you should report to your doctor or health care professional as soon as possible: allergic reactions like skin rash, itching or hives, swelling of the face, lips, or tongue low blood  counts - this medicine may decrease the number of white blood cells, red blood cells and platelets. You may be at increased risk for infections and bleeding. signs of infection - fever or chills, cough, sore throat, pain or difficulty passing urine signs of decreased platelets or bleeding - bruising, pinpoint red spots on the skin, black, tarry stools, blood in the urine signs of decreased red blood cells - unusually weak or tired, fainting spells, lightheadedness breathing problems changes in vision chest pain mouth sores nausea and vomiting pain, swelling, redness at site where injected pain, tingling, numbness in the hands or feet redness, swelling, or sores on hands or feet stomach pain unusual bleeding Side effects that usually do not require medical attention (report to your doctor or health care professional if they continue or are bothersome): changes in finger or toe nails diarrhea dry or itchy skin hair loss headache loss of appetite sensitivity of eyes to the light stomach upset unusually teary eyes This list may not describe all possible side effects. Call your doctor for medical advice about side effects. You may report side effects to FDA at  1-800-FDA-1088. Where should I keep my medication? This drug is given in a hospital or clinic and will not be stored at home. NOTE: This sheet is a summary. It may not cover all possible information. If you have questions about this medicine, talk to your doctor, pharmacist, or health care provider.  2022 Elsevier/Gold Standard (2018-12-26 15:00:03)  The chemotherapy medication bag should finish at 46 hours, 96 hours, or 7 days. For example, if your pump is scheduled for 46 hours and it was put on at 4:00 p.m., it should finish at 2:00 p.m. the day it is scheduled to come off regardless of your appointment time.     Estimated time to finish at 11:30 a.m. on Thursday 11/13/2020.   If the display on your pump reads "Low Volume" and it is beeping, take the batteries out of the pump and come to the cancer center for it to be taken off.   If the pump alarms go off prior to the pump reading "Low Volume" then call [phone] and someone can assist you.  If the plunger comes out and the chemotherapy medication is leaking out, please use your home chemo spill kit to clean up the spill. Do NOT use paper towels or other household products.  If you have problems or questions regarding your pump, please call either [phone] (24 hours a day) or the cancer center Monday-Friday 8:00 a.m.- 4:30 p.m. at the clinic number and we will assist you. If you are unable to get assistance, then go to the nearest Emergency Department and ask the staff to contact the IV team for assistance.

## 2020-11-11 NOTE — Progress Notes (Signed)
  [facility] OFFICE PROGRESS NOTE   Diagnosis:  Colon cancer  INTERVAL HISTORY:   Craig Best returns as scheduled.  He completed cycle 1 FOLFOX 10/28/2020.  He denies nausea/vomiting.  No mouth sores.  No diarrhea.  No significant cold sensitivity.  Decreased energy level for several days.  Noted reflux symptoms for several days.  He took an antacid with relief.  Had a mild headache following treatment.  Objective:  Vital signs in last 24 hours:  Blood pressure (!) 160/67, pulse 71, temperature 97.8 F (36.6 C), temperature source Oral, resp. rate 18, height _0  (1.727 m), weight 187 lb 12.8 oz (85.2 kg), SpO2 100 %.    HEENT: Mild white coating over tongue.  No ulcers. Resp: Lungs clear bilaterally. Cardio: Regular rate and rhythm. GI: Abdomen soft and nontender.  No hepatomegaly. Vascular: No leg edema. Neuro: Palms without erythema. Port-A-Cath without erythema.  Lab Results:  Lab Results  Component Value Date   WBC 5.8 11/11/2020   HGB 14.2 11/11/2020   HCT 42.0 11/11/2020   MCV 89.9 11/11/2020   PLT 260 11/11/2020   NEUTROABS 3.4 11/11/2020    Imaging:  No results found.  Medications: I have reviewed the patient's current medications.  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  Port-A-Cath placement-Dr. Ninfa Linden 10/07/2020  Disposition: Craig Best appears stable.  He has completed 1  cycle of FOLFOX, overall tolerated well.  Plan to proceed with cycle 2 today as scheduled.  We reviewed the CBC and chemistry panel from today.  Labs adequate to proceed as above.  The mild headache may have been related to Aloxi.  He will let us know if the headache recurs after today's treatment.  He will return for lab, follow-up, cycle 3 FOLFOX in 2 weeks.  He will contact the office in the interim with any problems.    Ned Card ANP/GNP-BC   11/11/2020  9:40 AM

## 2020-11-13 ENCOUNTER — Other Ambulatory Visit: Payer: Self-pay

## 2020-11-13 ENCOUNTER — Inpatient Hospital Stay: Payer: Medicare Other

## 2020-11-13 VITALS — BP 140/70 | HR 60 | Temp 98.5°F | Resp 18

## 2020-11-13 DIAGNOSIS — Z5111 Encounter for antineoplastic chemotherapy: Secondary | ICD-10-CM | POA: Diagnosis not present

## 2020-11-13 DIAGNOSIS — C187 Malignant neoplasm of sigmoid colon: Secondary | ICD-10-CM

## 2020-11-13 MED ORDER — HEPARIN SOD (PORK) LOCK FLUSH 100 UNIT/ML IV SOLN
500.0000 [IU] | Freq: Once | INTRAVENOUS | Status: AC | PRN
  Administered 2020-11-13: 500 [IU]

## 2020-11-13 MED ORDER — SODIUM CHLORIDE 0.9% FLUSH
10.0000 mL | INTRAVENOUS | Status: DC | PRN
  Administered 2020-11-13: 10 mL

## 2020-11-23 ENCOUNTER — Other Ambulatory Visit: Payer: Self-pay | Admitting: Oncology

## 2020-11-25 ENCOUNTER — Telehealth: Payer: Self-pay

## 2020-11-25 ENCOUNTER — Inpatient Hospital Stay: Payer: Medicare Other

## 2020-11-25 ENCOUNTER — Inpatient Hospital Stay (HOSPITAL_BASED_OUTPATIENT_CLINIC_OR_DEPARTMENT_OTHER): Payer: Medicare Other | Admitting: Oncology

## 2020-11-25 ENCOUNTER — Other Ambulatory Visit: Payer: Self-pay

## 2020-11-25 VITALS — BP 150/69 | HR 61 | Temp 97.8°F | Resp 18 | Ht 68.0 in | Wt 189.4 lb

## 2020-11-25 DIAGNOSIS — C187 Malignant neoplasm of sigmoid colon: Secondary | ICD-10-CM | POA: Diagnosis not present

## 2020-11-25 DIAGNOSIS — Z5111 Encounter for antineoplastic chemotherapy: Secondary | ICD-10-CM | POA: Diagnosis not present

## 2020-11-25 LAB — CMP (CANCER CENTER ONLY)
ALT: 26 U/L (ref 0–44)
AST: 26 U/L (ref 15–41)
Albumin: 3.9 g/dL (ref 3.5–5.0)
Alkaline Phosphatase: 75 U/L (ref 38–126)
Anion gap: 8 (ref 5–15)
BUN: 14 mg/dL (ref 8–23)
CO2: 29 mmol/L (ref 22–32)
Calcium: 9.1 mg/dL (ref 8.9–10.3)
Chloride: 104 mmol/L (ref 98–111)
Creatinine: 0.72 mg/dL (ref 0.61–1.24)
GFR, Estimated: >60 mL/min (ref >60)
Glucose, Bld: 113 mg/dL — ABNORMAL HIGH (ref 70–99)
Potassium: 3.7 mmol/L (ref 3.5–5.1)
Sodium: 141 mmol/L (ref 135–145)
Total Bilirubin: 0.4 mg/dL (ref 0.3–1.2)
Total Protein: 6.8 g/dL (ref 6.5–8.1)

## 2020-11-25 LAB — CBC WITH DIFFERENTIAL (CANCER CENTER ONLY)
Abs Immature Granulocytes: 0.01 10*3/uL (ref 0.00–0.07)
Basophils Absolute: 0.1 10*3/uL (ref 0.0–0.1)
Basophils Relative: 2 %
Eosinophils Absolute: 0.3 10*3/uL (ref 0.0–0.5)
Eosinophils Relative: 5 %
HCT: 39.8 % (ref 39.0–52.0)
Hemoglobin: 13.7 g/dL (ref 13.0–17.0)
Immature Granulocytes: 0 %
Lymphocytes Relative: 27 %
Lymphs Abs: 1.8 10*3/uL (ref 0.7–4.0)
MCH: 30.6 pg (ref 26.0–34.0)
MCHC: 34.4 g/dL (ref 30.0–36.0)
MCV: 88.8 fL (ref 80.0–100.0)
Monocytes Absolute: 0.8 10*3/uL (ref 0.1–1.0)
Monocytes Relative: 12 %
Neutro Abs: 3.7 10*3/uL (ref 1.7–7.7)
Neutrophils Relative %: 54 %
Platelet Count: 205 10*3/uL (ref 150–400)
RBC: 4.48 MIL/uL (ref 4.22–5.81)
RDW: 14.2 % (ref 11.5–15.5)
WBC Count: 6.7 10*3/uL (ref 4.0–10.5)
nRBC: 0 % (ref 0.0–0.2)

## 2020-11-25 MED ORDER — FLUOROURACIL CHEMO INJECTION 2.5 GM/50ML
400.0000 mg/m2 | Freq: Once | INTRAVENOUS | Status: AC
  Administered 2020-11-25: 800 mg via INTRAVENOUS
  Filled 2020-11-25: qty 16

## 2020-11-25 MED ORDER — LEUCOVORIN CALCIUM INJECTION 350 MG
400.0000 mg/m2 | Freq: Once | INTRAVENOUS | Status: AC
  Administered 2020-11-25: 800 mg via INTRAVENOUS
  Filled 2020-11-25: qty 40

## 2020-11-25 MED ORDER — PALONOSETRON HCL INJECTION 0.25 MG/5ML
0.2500 mg | Freq: Once | INTRAVENOUS | Status: AC
  Administered 2020-11-25: 0.25 mg via INTRAVENOUS
  Filled 2020-11-25: qty 5

## 2020-11-25 MED ORDER — SODIUM CHLORIDE 0.9 % IV SOLN
5000.0000 mg | INTRAVENOUS | Status: DC
  Administered 2020-11-25: 5000 mg via INTRAVENOUS
  Filled 2020-11-25: qty 100

## 2020-11-25 MED ORDER — OXALIPLATIN CHEMO INJECTION 100 MG/20ML
85.0000 mg/m2 | Freq: Once | INTRAVENOUS | Status: AC
  Administered 2020-11-25: 170 mg via INTRAVENOUS
  Filled 2020-11-25: qty 34

## 2020-11-25 MED ORDER — DEXTROSE 5 % IV SOLN: Freq: Once | INTRAVENOUS | Status: AC

## 2020-11-25 MED ORDER — SODIUM CHLORIDE 0.9 % IV SOLN
10.0000 mg | Freq: Once | INTRAVENOUS | Status: AC
  Administered 2020-11-25: 10 mg via INTRAVENOUS
  Filled 2020-11-25: qty 1

## 2020-11-25 NOTE — Patient Instructions (Addendum)
[facility]  Discharge Instructions: Thank you for choosing [facility] to provide your oncology and hematology care.   If you have a lab appointment with the [facility], please go directly to the [facility] and check in at the registration area.   Wear comfortable clothing and clothing appropriate for easy access to any Portacath or PICC line.   We strive to give you quality time with your provider. You may need to reschedule your appointment if you arrive late (15 or more minutes).  Arriving late affects you and other patients whose appointments are after yours.  Also, if you miss three or more appointments without notifying the office, you may be dismissed from the clinic at the provider's discretion.      For prescription refill requests, have your pharmacy contact our office and allow 72 hours for refills to be completed.    Today you received the following chemotherapy and/or immunotherapy agents Oxaliplatin, Leukovorin, %FU    To help prevent nausea and vomiting after your treatment, we encourage you to take your nausea medication as directed.  BELOW ARE SYMPTOMS THAT SHOULD BE REPORTED IMMEDIATELY: *FEVER GREATER THAN 100.4 F (38 C) OR HIGHER *CHILLS OR SWEATING *NAUSEA AND VOMITING THAT IS NOT CONTROLLED WITH YOUR NAUSEA MEDICATION *UNUSUAL SHORTNESS OF BREATH *UNUSUAL BRUISING OR BLEEDING *URINARY PROBLEMS (pain or burning when urinating, or frequent urination) *BOWEL PROBLEMS (unusual diarrhea, constipation, pain near the anus) TENDERNESS IN MOUTH AND THROAT WITH OR WITHOUT PRESENCE OF ULCERS (sore throat, sores in mouth, or a toothache) UNUSUAL RASH, SWELLING OR PAIN  UNUSUAL VAGINAL DISCHARGE OR ITCHING   Items with * indicate a potential emergency and should be followed up as soon as possible or go to the Emergency Department if any problems should occur.  Please show the CHEMOTHERAPY ALERT CARD or IMMUNOTHERAPY ALERT CARD at  check-in to the Emergency Department and triage nurse.  Should you have questions after your visit or need to cancel or reschedule your appointment, please contact [facility]  Dept: [phone]  and follow the prompts.  Office hours are 8:00 a.m. to 4:30 p.m. Monday - Friday. Please note that voicemails left after 4:00 p.m. may not be returned until the following business day.  We are closed weekends and major holidays. You have access to a nurse at all times for urgent questions. Please call the main number to the clinic Dept: [phone] and follow the prompts.   For any non-urgent questions, you may also contact your provider using MyChart. We now offer e-Visits for anyone 66 and older to request care online for non-urgent symptoms. For details visit [email or web address].   Also download the MyChart app! Go to the app store, search "MyChart", open the app, select [facility], and log in with your MyChart username and password.  Due to Covid, a mask is required upon entering the hospital/clinic. If you do not have a mask, one will be given to you upon arrival. For doctor visits, patients may have 1 support person aged 29 or older with them. For treatment visits, patients cannot have anyone with them due to current Covid guidelines and our immunocompromised population.

## 2020-11-25 NOTE — Telephone Encounter (Signed)
Patient seen by Dr. Sherrill today ? ?Vitals are within treatment parameters. ? ?Labs reviewed by Dr. Sherrill and are within treatment parameters. ? ?Per physician team, patient is ready for treatment and there are NO modifications to the treatment plan.  ?

## 2020-11-25 NOTE — Progress Notes (Signed)
  [facility] OFFICE PROGRESS NOTE   Diagnosis: Colon cancer  INTERVAL HISTORY:   Mr. Mcclure completed another cycle of FOLFOX on 11/11/2020.  No nausea/vomiting, mouth sores, diarrhea, or neuropathy symptoms.  He reports a headache beginning a few days following chemotherapy and lasting 4 days.  The headache was mild.  He is working.  He has malaise during the week following chemotherapy.  Objective:  Vital signs in last 24 hours:  Blood pressure (!) 150/69, pulse 61, temperature 97.8 F (36.6 C), temperature source Oral, resp. rate 18, height $RemoveBe'5\' 8"'wLSzSDDLD$  (1.727 m), weight 189 lb 6.4 oz (85.9 kg), SpO2 99 %.    HEENT: No thrush or ulcers Resp: Lungs clear bilaterally Cardio: Regular rate and rhythm GI: No hepatosplenomegaly, left lower quadrant colostomy Vascular: No leg edema  Skin: Palms without erythema  Portacath/PICC-without erythema  Lab Results:  Lab Results  Component Value Date   WBC 6.7 11/25/2020   HGB 13.7 11/25/2020   HCT 39.8 11/25/2020   MCV 88.8 11/25/2020   PLT 205 11/25/2020   NEUTROABS 3.7 11/25/2020    CMP  Lab Results  Component Value Date   NA 141 11/25/2020   K 3.7 11/25/2020   CL 104 11/25/2020   CO2 29 11/25/2020   GLUCOSE 113 (H) 11/25/2020   BUN 14 11/25/2020   CREATININE 0.72 11/25/2020   CALCIUM 9.1 11/25/2020   PROT 6.8 11/25/2020   ALBUMIN 3.9 11/25/2020   AST 26 11/25/2020   ALT 26 11/25/2020   ALKPHOS 75 11/25/2020   BILITOT 0.4 11/25/2020   GFRNONAA >60 11/25/2020    Lab Results  Component Value Date   CEA1 63.2 (H) 09/19/2020   CEA 5.38 (H) 11/11/2020   Medications: I have reviewed the patient's current medications.   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  Port-A-Cath placement-Dr. Ninfa Linden 10/07/2020   Disposition: Mr. Nees appears stable.  He has completed 2 cycles of FOLFOX.  He will complete cycle 3 today.  He is tolerating the chemotherapy well.  He will return for an office visit and chemotherapy in 2 weeks. Mr. Truex will be referred for restaging CTs after cycle 5 FOLFOX.  Betsy Coder, MD  11/25/2020  10:09 AM

## 2020-11-27 ENCOUNTER — Other Ambulatory Visit: Payer: Self-pay

## 2020-11-27 ENCOUNTER — Inpatient Hospital Stay: Payer: Medicare Other

## 2020-11-27 VITALS — BP 140/61 | HR 59 | Resp 18

## 2020-11-27 DIAGNOSIS — Z5111 Encounter for antineoplastic chemotherapy: Secondary | ICD-10-CM | POA: Diagnosis not present

## 2020-11-27 DIAGNOSIS — C187 Malignant neoplasm of sigmoid colon: Secondary | ICD-10-CM

## 2020-11-27 MED ORDER — HEPARIN SOD (PORK) LOCK FLUSH 100 UNIT/ML IV SOLN
500.0000 [IU] | Freq: Once | INTRAVENOUS | Status: AC | PRN
  Administered 2020-11-27: 500 [IU]

## 2020-11-27 MED ORDER — SODIUM CHLORIDE 0.9% FLUSH
10.0000 mL | INTRAVENOUS | Status: DC | PRN
  Administered 2020-11-27: 10 mL

## 2020-12-07 ENCOUNTER — Other Ambulatory Visit: Payer: Self-pay | Admitting: Oncology

## 2020-12-09 ENCOUNTER — Inpatient Hospital Stay: Payer: Medicare Other

## 2020-12-09 ENCOUNTER — Other Ambulatory Visit: Payer: Self-pay

## 2020-12-09 ENCOUNTER — Telehealth: Payer: Self-pay

## 2020-12-09 ENCOUNTER — Inpatient Hospital Stay: Payer: Medicare Other | Attending: Nurse Practitioner

## 2020-12-09 ENCOUNTER — Inpatient Hospital Stay (HOSPITAL_BASED_OUTPATIENT_CLINIC_OR_DEPARTMENT_OTHER): Payer: Medicare Other | Admitting: Oncology

## 2020-12-09 VITALS — BP 145/71 | HR 74 | Temp 98.7°F | Resp 18 | Ht 68.0 in | Wt 189.6 lb

## 2020-12-09 DIAGNOSIS — Z5111 Encounter for antineoplastic chemotherapy: Secondary | ICD-10-CM | POA: Diagnosis not present

## 2020-12-09 DIAGNOSIS — C187 Malignant neoplasm of sigmoid colon: Secondary | ICD-10-CM | POA: Insufficient documentation

## 2020-12-09 LAB — CMP (CANCER CENTER ONLY)
ALT: 25 U/L (ref 0–44)
AST: 28 U/L (ref 15–41)
Albumin: 3.8 g/dL (ref 3.5–5.0)
Alkaline Phosphatase: 71 U/L (ref 38–126)
Anion gap: 7 (ref 5–15)
BUN: 15 mg/dL (ref 8–23)
CO2: 28 mmol/L (ref 22–32)
Calcium: 8.7 mg/dL — ABNORMAL LOW (ref 8.9–10.3)
Chloride: 104 mmol/L (ref 98–111)
Creatinine: 0.76 mg/dL (ref 0.61–1.24)
GFR, Estimated: >60 mL/min (ref >60)
Glucose, Bld: 118 mg/dL — ABNORMAL HIGH (ref 70–99)
Potassium: 3.8 mmol/L (ref 3.5–5.1)
Sodium: 139 mmol/L (ref 135–145)
Total Bilirubin: 0.4 mg/dL (ref 0.3–1.2)
Total Protein: 6.9 g/dL (ref 6.5–8.1)

## 2020-12-09 LAB — CBC WITH DIFFERENTIAL (CANCER CENTER ONLY)
Abs Immature Granulocytes: 0.03 10*3/uL (ref 0.00–0.07)
Basophils Absolute: 0.2 10*3/uL — ABNORMAL HIGH (ref 0.0–0.1)
Basophils Relative: 2 %
Eosinophils Absolute: 0.3 10*3/uL (ref 0.0–0.5)
Eosinophils Relative: 4 %
HCT: 41 % (ref 39.0–52.0)
Hemoglobin: 13.8 g/dL (ref 13.0–17.0)
Immature Granulocytes: 0 %
Lymphocytes Relative: 23 %
Lymphs Abs: 1.8 10*3/uL (ref 0.7–4.0)
MCH: 29.7 pg (ref 26.0–34.0)
MCHC: 33.7 g/dL (ref 30.0–36.0)
MCV: 88.4 fL (ref 80.0–100.0)
Monocytes Absolute: 0.7 10*3/uL (ref 0.1–1.0)
Monocytes Relative: 9 %
Neutro Abs: 5.1 10*3/uL (ref 1.7–7.7)
Neutrophils Relative %: 62 %
Platelet Count: 174 10*3/uL (ref 150–400)
RBC: 4.64 MIL/uL (ref 4.22–5.81)
RDW: 15.1 % (ref 11.5–15.5)
WBC Count: 8.1 10*3/uL (ref 4.0–10.5)
nRBC: 0 % (ref 0.0–0.2)

## 2020-12-09 MED ORDER — PALONOSETRON HCL INJECTION 0.25 MG/5ML
0.2500 mg | Freq: Once | INTRAVENOUS | Status: AC
  Administered 2020-12-09: 0.25 mg via INTRAVENOUS
  Filled 2020-12-09: qty 5

## 2020-12-09 MED ORDER — DEXTROSE 5 % IV SOLN
400.0000 mg/m2 | Freq: Once | INTRAVENOUS | Status: AC
  Administered 2020-12-09: 800 mg via INTRAVENOUS
  Filled 2020-12-09: qty 40

## 2020-12-09 MED ORDER — FLUOROURACIL CHEMO INJECTION 2.5 GM/50ML
400.0000 mg/m2 | Freq: Once | INTRAVENOUS | Status: AC
  Administered 2020-12-09: 800 mg via INTRAVENOUS
  Filled 2020-12-09: qty 16

## 2020-12-09 MED ORDER — SODIUM CHLORIDE 0.9 % IV SOLN
5000.0000 mg | INTRAVENOUS | Status: DC
  Administered 2020-12-09: 5000 mg via INTRAVENOUS
  Filled 2020-12-09: qty 100

## 2020-12-09 MED ORDER — DEXTROSE 5 % IV SOLN: Freq: Once | INTRAVENOUS | Status: AC

## 2020-12-09 MED ORDER — OXALIPLATIN CHEMO INJECTION 100 MG/20ML
85.0000 mg/m2 | Freq: Once | INTRAVENOUS | Status: AC
  Administered 2020-12-09: 170 mg via INTRAVENOUS
  Filled 2020-12-09: qty 34

## 2020-12-09 MED ORDER — SODIUM CHLORIDE 0.9 % IV SOLN
10.0000 mg | Freq: Once | INTRAVENOUS | Status: AC
  Administered 2020-12-09: 10 mg via INTRAVENOUS
  Filled 2020-12-09: qty 1

## 2020-12-09 NOTE — Progress Notes (Signed)
  [facility] OFFICE PROGRESS NOTE   Diagnosis: Colon cancer  INTERVAL HISTORY:   [facility] completed another cycle of FOLFOX on 11/25/2020.  No nausea/vomiting, mouth sores, diarrhea, or neuropathy symptoms.  He feels well.  Objective:  Vital signs in last 24 hours:  Blood pressure (!) 145/71, pulse 74, temperature 98.7 F (37.1 C), temperature source Oral, resp. rate 18, height $RemoveBe'5\' 8"'MQtOsWYyi$  (1.727 m), weight 189 lb 9.6 oz (86 kg), SpO2 99 %.    HEENT: No thrush or ulcers Resp: Lungs clear bilaterally Cardio: Regular rate and rhythm GI: No hepatomegaly, left lower quadrant colostomy Vascular: No leg edema     Portacath/PICC-without erythema  Lab Results:  Lab Results  Component Value Date   WBC 8.1 12/09/2020   HGB 13.8 12/09/2020   HCT 41.0 12/09/2020   MCV 88.4 12/09/2020   PLT 174 12/09/2020   NEUTROABS 5.1 12/09/2020    CMP  Lab Results  Component Value Date   NA 141 11/25/2020   K 3.7 11/25/2020   CL 104 11/25/2020   CO2 29 11/25/2020   GLUCOSE 113 (H) 11/25/2020   BUN 14 11/25/2020   CREATININE 0.72 11/25/2020   CALCIUM 9.1 11/25/2020   PROT 6.8 11/25/2020   ALBUMIN 3.9 11/25/2020   AST 26 11/25/2020   ALT 26 11/25/2020   ALKPHOS 75 11/25/2020   BILITOT 0.4 11/25/2020   GFRNONAA >60 11/25/2020    Lab Results  Component Value Date   CEA1 63.2 (H) 09/19/2020   CEA 5.38 (H) 11/11/2020   ed the patient's current medications.   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), status post a sigmoid colectomy and partial bladder resection 09/19/2020 2 separate sigmoid colon tumors, 2.5 cm apart, 1/13 lymph nodes positive, tumor extends to adherent soft tissue of the bladder, MSS, normal mismatch repair protein expression tumor mutation burden 0, K-ras G12D CT abdomen/pelvis 09/19/2020-sigmoid colon mass with colonic obstruction, 6 mm right lower lobe nodule CT chest 09/22/2020-multiple bilateral lung nodules consistent with  metastases Elevated preoperative CEA Cycle 1 FOLFOX 10/28/2020 Cycle 2 FOLFOX 11/11/2020 Cycle 3 FOLFOX 11/25/2020 Cycle 4 FOLFOX 12/09/2020   2.  Port-A-Cath placement-Dr. Ninfa Linden 10/07/2020    Disposition: [name].  He is tolerating the chemotherapy well.  He will complete another cycle of FOLFOX today.  He will return for an office visit and chemotherapy in 2 weeks.  He will be scheduled for a restaging CT after cycle 5.  Craig Coder, MD  12/09/2020  9:49 AM

## 2020-12-09 NOTE — Telephone Encounter (Addendum)
Patient seen by Dr. Sherrill today ? ?Vitals are within treatment parameters. ? ?Labs reviewed by Dr. Sherrill and are within treatment parameters. ? ?Per physician team, patient is ready for treatment and there are NO modifications to the treatment plan.  ?

## 2020-12-09 NOTE — Patient Instructions (Signed)
[place]   Discharge Instructions: Thank you for choosing [facility] to provide your oncology and hematology care.   If you have a lab appointment with the [facility], please go directly to the [facility] and check in at the registration area.   Wear comfortable clothing and clothing appropriate for easy access to any Portacath or PICC line.   We strive to give you quality time with your provider. You may need to reschedule your appointment if you arrive late (15 or more minutes).  Arriving late affects you and other patients whose appointments are after yours.  Also, if you miss three or more appointments without notifying the office, you may be dismissed from the clinic at the provider's discretion.      For prescription refill requests, have your pharmacy contact our office and allow 72 hours for refills to be completed.    Today you received the following chemotherapy and/or immunotherapy agents Oxaliplatin (ELOXATIN), Leucovorin & Flourouracil (ADRUCIL).      To help prevent nausea and vomiting after your treatment, we encourage you to take your nausea medication as directed.  BELOW ARE SYMPTOMS THAT SHOULD BE REPORTED IMMEDIATELY: *FEVER GREATER THAN 100.4 F (38 C) OR HIGHER *CHILLS OR SWEATING *NAUSEA AND VOMITING THAT IS NOT CONTROLLED WITH YOUR NAUSEA MEDICATION *UNUSUAL SHORTNESS OF BREATH *UNUSUAL BRUISING OR BLEEDING *URINARY PROBLEMS (pain or burning when urinating, or frequent urination) *BOWEL PROBLEMS (unusual diarrhea, constipation, pain near the anus) TENDERNESS IN MOUTH AND THROAT WITH OR WITHOUT PRESENCE OF ULCERS (sore throat, sores in mouth, or a toothache) UNUSUAL RASH, SWELLING OR PAIN  UNUSUAL VAGINAL DISCHARGE OR ITCHING   Items with * indicate a potential emergency and should be followed up as soon as possible or go to the Emergency Department if any problems should occur.  Please show the CHEMOTHERAPY ALERT  CARD or IMMUNOTHERAPY ALERT CARD at check-in to the Emergency Department and triage nurse.  Should you have questions after your visit or need to cancel or reschedule your appointment, please contact [facility]  Dept: [phone]  and follow the prompts.  Office hours are 8:00 a.m. to 4:30 p.m. Monday - Friday. Please note that voicemails left after 4:00 p.m. may not be returned until the following business day.  We are closed weekends and major holidays. You have access to a nurse at all times for urgent questions. Please call the main number to the clinic Dept: [phone] and follow the prompts.   For any non-urgent questions, you may also contact your provider using MyChart. We now offer e-Visits for anyone 34 and older to request care online for non-urgent symptoms. For details visit [email or web address].   Also download the MyChart app! Go to the app store, search "MyChart", open the app, select [facility], and log in with your MyChart username and password.  Due to Covid, a mask is required upon entering the hospital/clinic. If you do not have a mask, one will be given to you upon arrival. For doctor visits, patients may have 1 support person aged 67 or older with them. For treatment visits, patients cannot have anyone with them due to current Covid guidelines and our immunocompromised population.   Oxaliplatin Injection What is this medication? OXALIPLATIN (ox AL i PLA tin) is a chemotherapy drug. It targets fast dividing cells, like cancer cells, and causes these cells to die. This medicine is used to treat cancers of the colon and rectum, and  many other cancers. This medicine may be used for other purposes; ask your health care provider or pharmacist if you have questions. COMMON BRAND NAME(S): Eloxatin What should I tell my care team before I take this medication? They need to know if you have any of these conditions: heart disease history of irregular  heartbeat liver disease low blood counts, like white cells, platelets, or red blood cells lung or breathing disease, like asthma take medicines that treat or prevent blood clots tingling of the fingers or toes, or other nerve disorder an unusual or allergic reaction to oxaliplatin, other chemotherapy, other medicines, foods, dyes, or preservatives pregnant or trying to get pregnant breast-feeding How should I use this medication? This drug is given as an infusion into a vein. It is administered in a hospital or clinic by a specially trained health care professional. Talk to your pediatrician regarding the use of this medicine in children. Special care may be needed. Overdosage: If you think you have taken too much of this medicine contact a poison control center or emergency room at once. NOTE: This medicine is only for you. Do not share this medicine with others. What if I miss a dose? It is important not to miss a dose. Call your doctor or health care professional if you are unable to keep an appointment. What may interact with this medication? Do not take this medicine with any of the following medications: cisapride dronedarone pimozide thioridazine This medicine may also interact with the following medications: aspirin and aspirin-like medicines certain medicines that treat or prevent blood clots like warfarin, apixaban, dabigatran, and rivaroxaban cisplatin cyclosporine diuretics medicines for infection like acyclovir, adefovir, amphotericin B, bacitracin, cidofovir, foscarnet, ganciclovir, gentamicin, pentamidine, vancomycin NSAIDs, medicines for pain and inflammation, like ibuprofen or naproxen other medicines that prolong the QT interval (an abnormal heart rhythm) pamidronate zoledronic acid This list may not describe all possible interactions. Give your health care provider a list of all the medicines, herbs, non-prescription drugs, or dietary supplements you use. Also tell  them if you smoke, drink alcohol, or use illegal drugs. Some items may interact with your medicine. What should I watch for while using this medication? Your condition will be monitored carefully while you are receiving this medicine. You may need blood work done while you are taking this medicine. This medicine may make you feel generally unwell. This is not uncommon as chemotherapy can affect healthy cells as well as cancer cells. Report any side effects. Continue your course of treatment even though you feel ill unless your healthcare professional tells you to stop. This medicine can make you more sensitive to cold. Do not drink cold drinks or use ice. Cover exposed skin before coming in contact with cold temperatures or cold objects. When out in cold weather wear warm clothing and cover your mouth and nose to warm the air that goes into your lungs. Tell your doctor if you get sensitive to the cold. Do not become pregnant while taking this medicine or for 9 months after stopping it. Women should inform their health care professional if they wish to become pregnant or think they might be pregnant. Men should not father a child while taking this medicine and for 6 months after stopping it. There is potential for serious side effects to an unborn child. Talk to your health care professional for more information. Do not breast-feed a child while taking this medicine or for 3 months after stopping it. This medicine has caused ovarian failure in  some women. This medicine may make it more difficult to get pregnant. Talk to your health care professional if you are concerned about your fertility. This medicine has caused decreased sperm counts in some men. This may make it more difficult to father a child. Talk to your health care professional if you are concerned about your fertility. This medicine may increase your risk of getting an infection. Call your health care professional for advice if you get a fever,  chills, or sore throat, or other symptoms of a cold or flu. Do not treat yourself. Try to avoid being around people who are sick. Avoid taking medicines that contain aspirin, acetaminophen, ibuprofen, naproxen, or ketoprofen unless instructed by your health care professional. These medicines may hide a fever. Be careful brushing or flossing your teeth or using a toothpick because you may get an infection or bleed more easily. If you have any dental work done, tell your dentist you are receiving this medicine. What side effects may I notice from receiving this medication? Side effects that you should report to your doctor or health care professional as soon as possible: allergic reactions like skin rash, itching or hives, swelling of the face, lips, or tongue breathing problems cough low blood counts - this medicine may decrease the number of white blood cells, red blood cells, and platelets. You may be at increased risk for infections and bleeding nausea, vomiting pain, redness, or irritation at site where injected pain, tingling, numbness in the hands or feet signs and symptoms of bleeding such as bloody or black, tarry stools; red or dark brown urine; spitting up blood or brown material that looks like coffee grounds; red spots on the skin; unusual bruising or bleeding from the eyes, gums, or nose signs and symptoms of a dangerous change in heartbeat or heart rhythm like chest pain; dizziness; fast, irregular heartbeat; palpitations; feeling faint or lightheaded; falls signs and symptoms of infection like fever; chills; cough; sore throat; pain or trouble passing urine signs and symptoms of liver injury like dark yellow or brown urine; general ill feeling or flu-like symptoms; light-colored stools; loss of appetite; nausea; right upper belly pain; unusually weak or tired; yellowing of the eyes or skin signs and symptoms of low red blood cells or anemia such as unusually weak or tired; feeling faint  or lightheaded; falls signs and symptoms of muscle injury like dark urine; trouble passing urine or change in the amount of urine; unusually weak or tired; muscle pain; back pain Side effects that usually do not require medical attention (report to your doctor or health care professional if they continue or are bothersome): changes in taste diarrhea gas hair loss loss of appetite mouth sores This list may not describe all possible side effects. Call your doctor for medical advice about side effects. You may report side effects to FDA at 1-800-FDA-1088. Where should I keep my medication? This drug is given in a hospital or clinic and will not be stored at home. NOTE: This sheet is a summary. It may not cover all possible information. If you have questions about this medicine, talk to your doctor, pharmacist, or health care provider.  2022 Elsevier/Gold Standard (2018-06-14 12:20:35)  Leucovorin injection What is this medication? LEUCOVORIN (loo koe VOR in) is used to prevent or treat the harmful effects of some medicines. This medicine is used to treat anemia caused by a low amount of folic acid in the body. It is also used with 5-fluorouracil (5-FU) to treat colon  cancer. This medicine may be used for other purposes; ask your health care provider or pharmacist if you have questions. What should I tell my care team before I take this medication? They need to know if you have any of these conditions: anemia from low levels of vitamin B-12 in the blood an unusual or allergic reaction to leucovorin, folic acid, other medicines, foods, dyes, or preservatives pregnant or trying to get pregnant breast-feeding How should I use this medication? This medicine is for injection into a muscle or into a vein. It is given by a health care professional in a hospital or clinic setting. Talk to your pediatrician regarding the use of this medicine in children. Special care may be needed. Overdosage: If you  think you have taken too much of this medicine contact a poison control center or emergency room at once. NOTE: This medicine is only for you. Do not share this medicine with others. What if I miss a dose? This does not apply. What may interact with this medication? capecitabine fluorouracil phenobarbital phenytoin primidone trimethoprim-sulfamethoxazole This list may not describe all possible interactions. Give your health care provider a list of all the medicines, herbs, non-prescription drugs, or dietary supplements you use. Also tell them if you smoke, drink alcohol, or use illegal drugs. Some items may interact with your medicine. What should I watch for while using this medication? Your condition will be monitored carefully while you are receiving this medicine. This medicine may increase the side effects of 5-fluorouracil, 5-FU. Tell your doctor or health care professional if you have diarrhea or mouth sores that do not get better or that get worse. What side effects may I notice from receiving this medication? Side effects that you should report to your doctor or health care professional as soon as possible: allergic reactions like skin rash, itching or hives, swelling of the face, lips, or tongue breathing problems fever, infection mouth sores unusual bleeding or bruising unusually weak or tired Side effects that usually do not require medical attention (report to your doctor or health care professional if they continue or are bothersome): constipation or diarrhea loss of appetite nausea, vomiting This list may not describe all possible side effects. Call your doctor for medical advice about side effects. You may report side effects to FDA at 1-800-FDA-1088. Where should I keep my medication? This drug is given in a hospital or clinic and will not be stored at home. NOTE: This sheet is a summary. It may not cover all possible information. If you have questions about this  medicine, talk to your doctor, pharmacist, or health care provider.  2022 Elsevier/Gold Standard (2007-08-01 16:50:29)  Fluorouracil, 5-FU injection What is this medication? FLUOROURACIL, 5-FU (flure oh YOOR a sil) is a chemotherapy drug. It slows the growth of cancer cells. This medicine is used to treat many types of cancer like breast cancer, colon or rectal cancer, pancreatic cancer, and stomach cancer. This medicine may be used for other purposes; ask your health care provider or pharmacist if you have questions. COMMON BRAND NAME(S): Adrucil What should I tell my care team before I take this medication? They need to know if you have any of these conditions: blood disorders dihydropyrimidine dehydrogenase (DPD) deficiency infection (especially a virus infection such as chickenpox, cold sores, or herpes) kidney disease liver disease malnourished, poor nutrition recent or ongoing radiation therapy an unusual or allergic reaction to fluorouracil, other chemotherapy, other medicines, foods, dyes, or preservatives pregnant or trying to get pregnant  breast-feeding How should I use this medication? This drug is given as an infusion or injection into a vein. It is administered in a hospital or clinic by a specially trained health care professional. Talk to your pediatrician regarding the use of this medicine in children. Special care may be needed. Overdosage: If you think you have taken too much of this medicine contact a poison control center or emergency room at once. NOTE: This medicine is only for you. Do not share this medicine with others. What if I miss a dose? It is important not to miss your dose. Call your doctor or health care professional if you are unable to keep an appointment. What may interact with this medication? Do not take this medicine with any of the following medications: live virus vaccines This medicine may also interact with the following medications: medicines  that treat or prevent blood clots like warfarin, enoxaparin, and dalteparin This list may not describe all possible interactions. Give your health care provider a list of all the medicines, herbs, non-prescription drugs, or dietary supplements you use. Also tell them if you smoke, drink alcohol, or use illegal drugs. Some items may interact with your medicine. What should I watch for while using this medication? Visit your doctor for checks on your progress. This drug may make you feel generally unwell. This is not uncommon, as chemotherapy can affect healthy cells as well as cancer cells. Report any side effects. Continue your course of treatment even though you feel ill unless your doctor tells you to stop. In some cases, you may be given additional medicines to help with side effects. Follow all directions for their use. Call your doctor or health care professional for advice if you get a fever, chills or sore throat, or other symptoms of a cold or flu. Do not treat yourself. This drug decreases your body's ability to fight infections. Try to avoid being around people who are sick. This medicine may increase your risk to bruise or bleed. Call your doctor or health care professional if you notice any unusual bleeding. Be careful brushing and flossing your teeth or using a toothpick because you may get an infection or bleed more easily. If you have any dental work done, tell your dentist you are receiving this medicine. Avoid taking products that contain aspirin, acetaminophen, ibuprofen, naproxen, or ketoprofen unless instructed by your doctor. These medicines may hide a fever. Do not become pregnant while taking this medicine. Women should inform their doctor if they wish to become pregnant or think they might be pregnant. There is a potential for serious side effects to an unborn child. Talk to your health care professional or pharmacist for more information. Do not breast-feed an infant while taking  this medicine. Men should inform their doctor if they wish to father a child. This medicine may lower sperm counts. Do not treat diarrhea with over the counter products. Contact your doctor if you have diarrhea that lasts more than 2 days or if it is severe and watery. This medicine can make you more sensitive to the sun. Keep out of the sun. If you cannot avoid being in the sun, wear protective clothing and use sunscreen. Do not use sun lamps or tanning beds/booths. What side effects may I notice from receiving this medication? Side effects that you should report to your doctor or health care professional as soon as possible: allergic reactions like skin rash, itching or hives, swelling of the face, lips, or tongue low blood  counts - this medicine may decrease the number of white blood cells, red blood cells and platelets. You may be at increased risk for infections and bleeding. signs of infection - fever or chills, cough, sore throat, pain or difficulty passing urine signs of decreased platelets or bleeding - bruising, pinpoint red spots on the skin, black, tarry stools, blood in the urine signs of decreased red blood cells - unusually weak or tired, fainting spells, lightheadedness breathing problems changes in vision chest pain mouth sores nausea and vomiting pain, swelling, redness at site where injected pain, tingling, numbness in the hands or feet redness, swelling, or sores on hands or feet stomach pain unusual bleeding Side effects that usually do not require medical attention (report to your doctor or health care professional if they continue or are bothersome): changes in finger or toe nails diarrhea dry or itchy skin hair loss headache loss of appetite sensitivity of eyes to the light stomach upset unusually teary eyes This list may not describe all possible side effects. Call your doctor for medical advice about side effects. You may report side effects to FDA at  1-800-FDA-1088. Where should I keep my medication? This drug is given in a hospital or clinic and will not be stored at home. NOTE: This sheet is a summary. It may not cover all possible information. If you have questions about this medicine, talk to your doctor, pharmacist, or health care provider.  2022 Elsevier/Gold Standard (2018-12-26 15:00:03)  The chemotherapy medication bag should finish at 46 hours, 96 hours, or 7 days. For example, if your pump is scheduled for 46 hours and it was put on at 4:00 p.m., it should finish at 2:00 p.m. the day it is scheduled to come off regardless of your appointment time.     Estimated time to finish at 12:00 p.m. on Thursday 12/11/2020.   If the display on your pump reads "Low Volume" and it is beeping, take the batteries out of the pump and come to the cancer center for it to be taken off.   If the pump alarms go off prior to the pump reading "Low Volume" then call [phone] and someone can assist you.  If the plunger comes out and the chemotherapy medication is leaking out, please use your home chemo spill kit to clean up the spill. Do NOT use paper towels or other household products.  If you have problems or questions regarding your pump, please call either [phone] (24 hours a day) or the cancer center Monday-Friday 8:00 a.m.- 4:30 p.m. at the clinic number and we will assist you. If you are unable to get assistance, then go to the nearest Emergency Department and ask the staff to contact the IV team for assistance.

## 2020-12-09 NOTE — Progress Notes (Signed)
Patient presents for treatment. RN assessment completed along with the following:  Labs/vitals reviewed - Yes, and within treatment parameters.   Weight within 10% of previous measurement - Yes Oncology Treatment Attestation completed for current therapy- Yes, on date 10/17/2020 Informed consent completed and reflects current therapy/intent - Yes, on date 10/28/2020             Provider progress note reviewed - Today's provider note is not yet available. I reviewed the most recent oncology provider progress note in chart dated 11/25/2020. Treatment/Antibody/Supportive plan reviewed - Yes, and there are no adjustments needed for today's treatment. S&H and other orders reviewed - Yes, and there are no additional orders identified. Previous treatment date reviewed - Yes, and the appropriate amount of time has elapsed between treatments. Clinic Hand Off Received from - [facility] Nurse Note.  Patient to proceed with treatment.

## 2020-12-11 ENCOUNTER — Other Ambulatory Visit: Payer: Self-pay

## 2020-12-11 ENCOUNTER — Inpatient Hospital Stay: Payer: Medicare Other

## 2020-12-11 VITALS — BP 133/61 | HR 81 | Temp 98.0°F | Resp 20

## 2020-12-11 DIAGNOSIS — C187 Malignant neoplasm of sigmoid colon: Secondary | ICD-10-CM

## 2020-12-11 DIAGNOSIS — Z5111 Encounter for antineoplastic chemotherapy: Secondary | ICD-10-CM | POA: Diagnosis not present

## 2020-12-11 MED ORDER — SODIUM CHLORIDE 0.9% FLUSH
10.0000 mL | INTRAVENOUS | Status: DC | PRN
  Administered 2020-12-11: 10 mL

## 2020-12-11 MED ORDER — HEPARIN SOD (PORK) LOCK FLUSH 100 UNIT/ML IV SOLN
500.0000 [IU] | Freq: Once | INTRAVENOUS | Status: AC | PRN
  Administered 2020-12-11: 500 [IU]

## 2020-12-21 ENCOUNTER — Other Ambulatory Visit: Payer: Self-pay | Admitting: Oncology

## 2020-12-23 ENCOUNTER — Encounter: Payer: Self-pay | Admitting: Oncology

## 2020-12-23 ENCOUNTER — Other Ambulatory Visit: Payer: Self-pay

## 2020-12-23 ENCOUNTER — Inpatient Hospital Stay: Payer: Medicare Other

## 2020-12-23 ENCOUNTER — Inpatient Hospital Stay (HOSPITAL_BASED_OUTPATIENT_CLINIC_OR_DEPARTMENT_OTHER): Payer: Medicare Other | Admitting: Oncology

## 2020-12-23 VITALS — BP 154/74 | HR 61 | Temp 98.7°F | Resp 20 | Ht 68.0 in | Wt 190.0 lb

## 2020-12-23 DIAGNOSIS — Z5111 Encounter for antineoplastic chemotherapy: Secondary | ICD-10-CM | POA: Diagnosis not present

## 2020-12-23 DIAGNOSIS — C187 Malignant neoplasm of sigmoid colon: Secondary | ICD-10-CM | POA: Diagnosis not present

## 2020-12-23 LAB — CBC WITH DIFFERENTIAL (CANCER CENTER ONLY)
Abs Immature Granulocytes: 0.01 10*3/uL (ref 0.00–0.07)
Basophils Absolute: 0.1 10*3/uL (ref 0.0–0.1)
Basophils Relative: 2 %
Eosinophils Absolute: 0.4 10*3/uL (ref 0.0–0.5)
Eosinophils Relative: 7 %
HCT: 39 % (ref 39.0–52.0)
Hemoglobin: 13.4 g/dL (ref 13.0–17.0)
Immature Granulocytes: 0 %
Lymphocytes Relative: 33 %
Lymphs Abs: 1.8 10*3/uL (ref 0.7–4.0)
MCH: 30.5 pg (ref 26.0–34.0)
MCHC: 34.4 g/dL (ref 30.0–36.0)
MCV: 88.8 fL (ref 80.0–100.0)
Monocytes Absolute: 0.8 10*3/uL (ref 0.1–1.0)
Monocytes Relative: 14 %
Neutro Abs: 2.4 10*3/uL (ref 1.7–7.7)
Neutrophils Relative %: 44 %
Platelet Count: 166 10*3/uL (ref 150–400)
RBC: 4.39 MIL/uL (ref 4.22–5.81)
RDW: 16.2 % — ABNORMAL HIGH (ref 11.5–15.5)
WBC Count: 5.5 10*3/uL (ref 4.0–10.5)
nRBC: 0 % (ref 0.0–0.2)

## 2020-12-23 LAB — CMP (CANCER CENTER ONLY)
ALT: 21 U/L (ref 0–44)
AST: 27 U/L (ref 15–41)
Albumin: 3.9 g/dL (ref 3.5–5.0)
Alkaline Phosphatase: 73 U/L (ref 38–126)
Anion gap: 7 (ref 5–15)
BUN: 13 mg/dL (ref 8–23)
CO2: 27 mmol/L (ref 22–32)
Calcium: 9.3 mg/dL (ref 8.9–10.3)
Chloride: 104 mmol/L (ref 98–111)
Creatinine: 0.76 mg/dL (ref 0.61–1.24)
GFR, Estimated: >60 mL/min (ref >60)
Glucose, Bld: 128 mg/dL — ABNORMAL HIGH (ref 70–99)
Potassium: 3.5 mmol/L (ref 3.5–5.1)
Sodium: 138 mmol/L (ref 135–145)
Total Bilirubin: 0.4 mg/dL (ref 0.3–1.2)
Total Protein: 7 g/dL (ref 6.5–8.1)

## 2020-12-23 LAB — CEA (ACCESS): CEA (CHCC): 4.37 ng/mL (ref 0.00–5.00)

## 2020-12-23 MED ORDER — SODIUM CHLORIDE 0.9 % IV SOLN
5000.0000 mg | INTRAVENOUS | Status: DC
  Administered 2020-12-23: 5000 mg via INTRAVENOUS
  Filled 2020-12-23: qty 100

## 2020-12-23 MED ORDER — FLUOROURACIL CHEMO INJECTION 2.5 GM/50ML
400.0000 mg/m2 | Freq: Once | INTRAVENOUS | Status: AC
  Administered 2020-12-23: 800 mg via INTRAVENOUS
  Filled 2020-12-23: qty 16

## 2020-12-23 MED ORDER — DEXTROSE 5 % IV SOLN: Freq: Once | INTRAVENOUS | Status: AC

## 2020-12-23 MED ORDER — SODIUM CHLORIDE 0.9 % IV SOLN
10.0000 mg | Freq: Once | INTRAVENOUS | Status: AC
  Administered 2020-12-23: 10 mg via INTRAVENOUS
  Filled 2020-12-23: qty 1

## 2020-12-23 MED ORDER — LEUCOVORIN CALCIUM INJECTION 350 MG
400.0000 mg/m2 | Freq: Once | INTRAVENOUS | Status: AC
  Administered 2020-12-23: 800 mg via INTRAVENOUS
  Filled 2020-12-23: qty 40

## 2020-12-23 MED ORDER — OXALIPLATIN CHEMO INJECTION 100 MG/20ML
85.0000 mg/m2 | Freq: Once | INTRAVENOUS | Status: AC
  Administered 2020-12-23: 170 mg via INTRAVENOUS
  Filled 2020-12-23: qty 34

## 2020-12-23 MED ORDER — PALONOSETRON HCL INJECTION 0.25 MG/5ML
0.2500 mg | Freq: Once | INTRAVENOUS | Status: AC
  Administered 2020-12-23: 0.25 mg via INTRAVENOUS
  Filled 2020-12-23: qty 5

## 2020-12-23 NOTE — Patient Instructions (Signed)

## 2020-12-23 NOTE — Patient Instructions (Addendum)
[place]   Discharge Instructions: Thank you for choosing [facility] to provide your oncology and hematology care.   If you have a lab appointment with the [facility], please go directly to the [facility] and check in at the registration area.   Wear comfortable clothing and clothing appropriate for easy access to any Portacath or PICC line.   We strive to give you quality time with your provider. You may need to reschedule your appointment if you arrive late (15 or more minutes).  Arriving late affects you and other patients whose appointments are after yours.  Also, if you miss three or more appointments without notifying the office, you may be dismissed from the clinic at the provider's discretion.      For prescription refill requests, have your pharmacy contact our office and allow 72 hours for refills to be completed.    Today you received the following chemotherapy and/or immunotherapy agents Oxaliplatin (ELOXATIN), Leucovorin & Flourouracil (ADRUCIL).      To help prevent nausea and vomiting after your treatment, we encourage you to take your nausea medication as directed.  BELOW ARE SYMPTOMS THAT SHOULD BE REPORTED IMMEDIATELY: *FEVER GREATER THAN 100.4 F (38 C) OR HIGHER *CHILLS OR SWEATING *NAUSEA AND VOMITING THAT IS NOT CONTROLLED WITH YOUR NAUSEA MEDICATION *UNUSUAL SHORTNESS OF BREATH *UNUSUAL BRUISING OR BLEEDING *URINARY PROBLEMS (pain or burning when urinating, or frequent urination) *BOWEL PROBLEMS (unusual diarrhea, constipation, pain near the anus) TENDERNESS IN MOUTH AND THROAT WITH OR WITHOUT PRESENCE OF ULCERS (sore throat, sores in mouth, or a toothache) UNUSUAL RASH, SWELLING OR PAIN  UNUSUAL VAGINAL DISCHARGE OR ITCHING   Items with * indicate a potential emergency and should be followed up as soon as possible or go to the Emergency Department if any problems should occur.  Please show the CHEMOTHERAPY ALERT  CARD or IMMUNOTHERAPY ALERT CARD at check-in to the Emergency Department and triage nurse.  Should you have questions after your visit or need to cancel or reschedule your appointment, please contact [facility]  Dept: [phone]  and follow the prompts.  Office hours are 8:00 a.m. to 4:30 p.m. Monday - Friday. Please note that voicemails left after 4:00 p.m. may not be returned until the following business day.  We are closed weekends and major holidays. You have access to a nurse at all times for urgent questions. Please call the main number to the clinic Dept: [phone] and follow the prompts.   For any non-urgent questions, you may also contact your provider using MyChart. We now offer e-Visits for anyone 34 and older to request care online for non-urgent symptoms. For details visit [email or web address].   Also download the MyChart app! Go to the app store, search "MyChart", open the app, select [facility], and log in with your MyChart username and password.  Due to Covid, a mask is required upon entering the hospital/clinic. If you do not have a mask, one will be given to you upon arrival. For doctor visits, patients may have 1 support person aged 67 or older with them. For treatment visits, patients cannot have anyone with them due to current Covid guidelines and our immunocompromised population.   Oxaliplatin Injection What is this medication? OXALIPLATIN (ox AL i PLA tin) is a chemotherapy drug. It targets fast dividing cells, like cancer cells, and causes these cells to die. This medicine is used to treat cancers of the colon and rectum, and  many other cancers. This medicine may be used for other purposes; ask your health care provider or pharmacist if you have questions. COMMON BRAND NAME(S): Eloxatin What should I tell my care team before I take this medication? They need to know if you have any of these conditions: heart disease history of irregular  heartbeat liver disease low blood counts, like white cells, platelets, or red blood cells lung or breathing disease, like asthma take medicines that treat or prevent blood clots tingling of the fingers or toes, or other nerve disorder an unusual or allergic reaction to oxaliplatin, other chemotherapy, other medicines, foods, dyes, or preservatives pregnant or trying to get pregnant breast-feeding How should I use this medication? This drug is given as an infusion into a vein. It is administered in a hospital or clinic by a specially trained health care professional. Talk to your pediatrician regarding the use of this medicine in children. Special care may be needed. Overdosage: If you think you have taken too much of this medicine contact a poison control center or emergency room at once. NOTE: This medicine is only for you. Do not share this medicine with others. What if I miss a dose? It is important not to miss a dose. Call your doctor or health care professional if you are unable to keep an appointment. What may interact with this medication? Do not take this medicine with any of the following medications: cisapride dronedarone pimozide thioridazine This medicine may also interact with the following medications: aspirin and aspirin-like medicines certain medicines that treat or prevent blood clots like warfarin, apixaban, dabigatran, and rivaroxaban cisplatin cyclosporine diuretics medicines for infection like acyclovir, adefovir, amphotericin B, bacitracin, cidofovir, foscarnet, ganciclovir, gentamicin, pentamidine, vancomycin NSAIDs, medicines for pain and inflammation, like ibuprofen or naproxen other medicines that prolong the QT interval (an abnormal heart rhythm) pamidronate zoledronic acid This list may not describe all possible interactions. Give your health care provider a list of all the medicines, herbs, non-prescription drugs, or dietary supplements you use. Also tell  them if you smoke, drink alcohol, or use illegal drugs. Some items may interact with your medicine. What should I watch for while using this medication? Your condition will be monitored carefully while you are receiving this medicine. You may need blood work done while you are taking this medicine. This medicine may make you feel generally unwell. This is not uncommon as chemotherapy can affect healthy cells as well as cancer cells. Report any side effects. Continue your course of treatment even though you feel ill unless your healthcare professional tells you to stop. This medicine can make you more sensitive to cold. Do not drink cold drinks or use ice. Cover exposed skin before coming in contact with cold temperatures or cold objects. When out in cold weather wear warm clothing and cover your mouth and nose to warm the air that goes into your lungs. Tell your doctor if you get sensitive to the cold. Do not become pregnant while taking this medicine or for 9 months after stopping it. Women should inform their health care professional if they wish to become pregnant or think they might be pregnant. Men should not father a child while taking this medicine and for 6 months after stopping it. There is potential for serious side effects to an unborn child. Talk to your health care professional for more information. Do not breast-feed a child while taking this medicine or for 3 months after stopping it. This medicine has caused ovarian failure in  some women. This medicine may make it more difficult to get pregnant. Talk to your health care professional if you are concerned about your fertility. This medicine has caused decreased sperm counts in some men. This may make it more difficult to father a child. Talk to your health care professional if you are concerned about your fertility. This medicine may increase your risk of getting an infection. Call your health care professional for advice if you get a fever,  chills, or sore throat, or other symptoms of a cold or flu. Do not treat yourself. Try to avoid being around people who are sick. Avoid taking medicines that contain aspirin, acetaminophen, ibuprofen, naproxen, or ketoprofen unless instructed by your health care professional. These medicines may hide a fever. Be careful brushing or flossing your teeth or using a toothpick because you may get an infection or bleed more easily. If you have any dental work done, tell your dentist you are receiving this medicine. What side effects may I notice from receiving this medication? Side effects that you should report to your doctor or health care professional as soon as possible: allergic reactions like skin rash, itching or hives, swelling of the face, lips, or tongue breathing problems cough low blood counts - this medicine may decrease the number of white blood cells, red blood cells, and platelets. You may be at increased risk for infections and bleeding nausea, vomiting pain, redness, or irritation at site where injected pain, tingling, numbness in the hands or feet signs and symptoms of bleeding such as bloody or black, tarry stools; red or dark brown urine; spitting up blood or brown material that looks like coffee grounds; red spots on the skin; unusual bruising or bleeding from the eyes, gums, or nose signs and symptoms of a dangerous change in heartbeat or heart rhythm like chest pain; dizziness; fast, irregular heartbeat; palpitations; feeling faint or lightheaded; falls signs and symptoms of infection like fever; chills; cough; sore throat; pain or trouble passing urine signs and symptoms of liver injury like dark yellow or brown urine; general ill feeling or flu-like symptoms; light-colored stools; loss of appetite; nausea; right upper belly pain; unusually weak or tired; yellowing of the eyes or skin signs and symptoms of low red blood cells or anemia such as unusually weak or tired; feeling faint  or lightheaded; falls signs and symptoms of muscle injury like dark urine; trouble passing urine or change in the amount of urine; unusually weak or tired; muscle pain; back pain Side effects that usually do not require medical attention (report to your doctor or health care professional if they continue or are bothersome): changes in taste diarrhea gas hair loss loss of appetite mouth sores This list may not describe all possible side effects. Call your doctor for medical advice about side effects. You may report side effects to FDA at 1-800-FDA-1088. Where should I keep my medication? This drug is given in a hospital or clinic and will not be stored at home. NOTE: This sheet is a summary. It may not cover all possible information. If you have questions about this medicine, talk to your doctor, pharmacist, or health care provider.  2022 Elsevier/Gold Standard (2018-06-14 12:20:35)  Leucovorin injection What is this medication? LEUCOVORIN (loo koe VOR in) is used to prevent or treat the harmful effects of some medicines. This medicine is used to treat anemia caused by a low amount of folic acid in the body. It is also used with 5-fluorouracil (5-FU) to treat colon  cancer. This medicine may be used for other purposes; ask your health care provider or pharmacist if you have questions. What should I tell my care team before I take this medication? They need to know if you have any of these conditions: anemia from low levels of vitamin B-12 in the blood an unusual or allergic reaction to leucovorin, folic acid, other medicines, foods, dyes, or preservatives pregnant or trying to get pregnant breast-feeding How should I use this medication? This medicine is for injection into a muscle or into a vein. It is given by a health care professional in a hospital or clinic setting. Talk to your pediatrician regarding the use of this medicine in children. Special care may be needed. Overdosage: If you  think you have taken too much of this medicine contact a poison control center or emergency room at once. NOTE: This medicine is only for you. Do not share this medicine with others. What if I miss a dose? This does not apply. What may interact with this medication? capecitabine fluorouracil phenobarbital phenytoin primidone trimethoprim-sulfamethoxazole This list may not describe all possible interactions. Give your health care provider a list of all the medicines, herbs, non-prescription drugs, or dietary supplements you use. Also tell them if you smoke, drink alcohol, or use illegal drugs. Some items may interact with your medicine. What should I watch for while using this medication? Your condition will be monitored carefully while you are receiving this medicine. This medicine may increase the side effects of 5-fluorouracil, 5-FU. Tell your doctor or health care professional if you have diarrhea or mouth sores that do not get better or that get worse. What side effects may I notice from receiving this medication? Side effects that you should report to your doctor or health care professional as soon as possible: allergic reactions like skin rash, itching or hives, swelling of the face, lips, or tongue breathing problems fever, infection mouth sores unusual bleeding or bruising unusually weak or tired Side effects that usually do not require medical attention (report to your doctor or health care professional if they continue or are bothersome): constipation or diarrhea loss of appetite nausea, vomiting This list may not describe all possible side effects. Call your doctor for medical advice about side effects. You may report side effects to FDA at 1-800-FDA-1088. Where should I keep my medication? This drug is given in a hospital or clinic and will not be stored at home. NOTE: This sheet is a summary. It may not cover all possible information. If you have questions about this  medicine, talk to your doctor, pharmacist, or health care provider.  2022 Elsevier/Gold Standard (2007-08-01 16:50:29)  Fluorouracil, 5-FU injection What is this medication? FLUOROURACIL, 5-FU (flure oh YOOR a sil) is a chemotherapy drug. It slows the growth of cancer cells. This medicine is used to treat many types of cancer like breast cancer, colon or rectal cancer, pancreatic cancer, and stomach cancer. This medicine may be used for other purposes; ask your health care provider or pharmacist if you have questions. COMMON BRAND NAME(S): Adrucil What should I tell my care team before I take this medication? They need to know if you have any of these conditions: blood disorders dihydropyrimidine dehydrogenase (DPD) deficiency infection (especially a virus infection such as chickenpox, cold sores, or herpes) kidney disease liver disease malnourished, poor nutrition recent or ongoing radiation therapy an unusual or allergic reaction to fluorouracil, other chemotherapy, other medicines, foods, dyes, or preservatives pregnant or trying to get pregnant  breast-feeding How should I use this medication? This drug is given as an infusion or injection into a vein. It is administered in a hospital or clinic by a specially trained health care professional. Talk to your pediatrician regarding the use of this medicine in children. Special care may be needed. Overdosage: If you think you have taken too much of this medicine contact a poison control center or emergency room at once. NOTE: This medicine is only for you. Do not share this medicine with others. What if I miss a dose? It is important not to miss your dose. Call your doctor or health care professional if you are unable to keep an appointment. What may interact with this medication? Do not take this medicine with any of the following medications: live virus vaccines This medicine may also interact with the following medications: medicines  that treat or prevent blood clots like warfarin, enoxaparin, and dalteparin This list may not describe all possible interactions. Give your health care provider a list of all the medicines, herbs, non-prescription drugs, or dietary supplements you use. Also tell them if you smoke, drink alcohol, or use illegal drugs. Some items may interact with your medicine. What should I watch for while using this medication? Visit your doctor for checks on your progress. This drug may make you feel generally unwell. This is not uncommon, as chemotherapy can affect healthy cells as well as cancer cells. Report any side effects. Continue your course of treatment even though you feel ill unless your doctor tells you to stop. In some cases, you may be given additional medicines to help with side effects. Follow all directions for their use. Call your doctor or health care professional for advice if you get a fever, chills or sore throat, or other symptoms of a cold or flu. Do not treat yourself. This drug decreases your body's ability to fight infections. Try to avoid being around people who are sick. This medicine may increase your risk to bruise or bleed. Call your doctor or health care professional if you notice any unusual bleeding. Be careful brushing and flossing your teeth or using a toothpick because you may get an infection or bleed more easily. If you have any dental work done, tell your dentist you are receiving this medicine. Avoid taking products that contain aspirin, acetaminophen, ibuprofen, naproxen, or ketoprofen unless instructed by your doctor. These medicines may hide a fever. Do not become pregnant while taking this medicine. Women should inform their doctor if they wish to become pregnant or think they might be pregnant. There is a potential for serious side effects to an unborn child. Talk to your health care professional or pharmacist for more information. Do not breast-feed an infant while taking  this medicine. Men should inform their doctor if they wish to father a child. This medicine may lower sperm counts. Do not treat diarrhea with over the counter products. Contact your doctor if you have diarrhea that lasts more than 2 days or if it is severe and watery. This medicine can make you more sensitive to the sun. Keep out of the sun. If you cannot avoid being in the sun, wear protective clothing and use sunscreen. Do not use sun lamps or tanning beds/booths. What side effects may I notice from receiving this medication? Side effects that you should report to your doctor or health care professional as soon as possible: allergic reactions like skin rash, itching or hives, swelling of the face, lips, or tongue low blood  counts - this medicine may decrease the number of white blood cells, red blood cells and platelets. You may be at increased risk for infections and bleeding. signs of infection - fever or chills, cough, sore throat, pain or difficulty passing urine signs of decreased platelets or bleeding - bruising, pinpoint red spots on the skin, black, tarry stools, blood in the urine signs of decreased red blood cells - unusually weak or tired, fainting spells, lightheadedness breathing problems changes in vision chest pain mouth sores nausea and vomiting pain, swelling, redness at site where injected pain, tingling, numbness in the hands or feet redness, swelling, or sores on hands or feet stomach pain unusual bleeding Side effects that usually do not require medical attention (report to your doctor or health care professional if they continue or are bothersome): changes in finger or toe nails diarrhea dry or itchy skin hair loss headache loss of appetite sensitivity of eyes to the light stomach upset unusually teary eyes This list may not describe all possible side effects. Call your doctor for medical advice about side effects. You may report side effects to FDA at  1-800-FDA-1088. Where should I keep my medication? This drug is given in a hospital or clinic and will not be stored at home. NOTE: This sheet is a summary. It may not cover all possible information. If you have questions about this medicine, talk to your doctor, pharmacist, or health care provider.  2022 Elsevier/Gold Standard (2018-12-26 15:00:03)  The chemotherapy medication bag should finish at 46 hours, 96 hours, or 7 days. For example, if your pump is scheduled for 46 hours and it was put on at 4:00 p.m., it should finish at 2:00 p.m. the day it is scheduled to come off regardless of your appointment time.     Estimated time to finish at 10:45 a.m. on Thursday 12/25/2020.   If the display on your pump reads "Low Volume" and it is beeping, take the batteries out of the pump and come to the cancer center for it to be taken off.   If the pump alarms go off prior to the pump reading "Low Volume" then call [phone] and someone can assist you.  If the plunger comes out and the chemotherapy medication is leaking out, please use your home chemo spill kit to clean up the spill. Do NOT use paper towels or other household products.  If you have problems or questions regarding your pump, please call either [phone] (24 hours a day) or the cancer center Monday-Friday 8:00 a.m.- 4:30 p.m. at the clinic number and we will assist you. If you are unable to get assistance, then go to the nearest Emergency Department and ask the staff to contact the IV team for assistance.

## 2020-12-23 NOTE — Progress Notes (Signed)
Patient presents for treatment. RN assessment completed along with the following:  Labs/vitals reviewed - Yes, and within treatment parameters.   Weight within 10% of previous measurement - Yes Oncology Treatment Attestation completed for current therapy- Yes, on date 10/17/20 Informed consent completed and reflects current therapy/intent - Yes, on date 10/28/20             Provider progress note reviewed - Today's provider note is not yet available. I reviewed the most recent oncology provider progress note in chart dated 12/09/20. Treatment/Antibody/Supportive plan reviewed - Yes, and there are no adjustments needed for today's treatment. S&H and other orders reviewed - Yes, and there are no additional orders identified. Previous treatment date reviewed - Yes, and the appropriate amount of time has elapsed between treatments.   Patient to proceed with treatment.

## 2020-12-23 NOTE — Progress Notes (Signed)
Patient seen by Dr. Sherrill today ? ?Vitals are within treatment parameters. ? ?Labs reviewed by Dr. Sherrill and are within treatment parameters. ? ?Per physician team, patient is ready for treatment and there are NO modifications to the treatment plan.  ?

## 2020-12-23 NOTE — Progress Notes (Signed)
  [facility] OFFICE PROGRESS NOTE   Diagnosis: Colon cancer  INTERVAL HISTORY:   Craig Best complete another cycle of FOLFOX on 12/09/2020.  He had malaise for 5 days following chemotherapy.  No nausea/vomiting, mouth sores, diarrhea, or neuropathy symptoms.  He continues working.  Objective:  Vital signs in last 24 hours:  Blood pressure (!) 154/74, pulse 61, temperature 98.7 F (37.1 C), temperature source Oral, resp. rate 20, height $RemoveBe'5\' 8"'gVpJugyRA$  (1.727 m), weight 190 lb (86.2 kg), SpO2 100 %.    HEENT: No thrush or ulcers, small ecchymoses at the right buccal mucosa Resp: Lungs clear bilaterally Cardio: Regular rate and rhythm, s2? GI: No hepatosplenomegaly, left lower quadrant colostomy, healed midline incision Vascular: No leg edema  Portacath/PICC-without erythema  Lab Results:  Lab Results  Component Value Date   WBC 5.5 12/23/2020   HGB 13.4 12/23/2020   HCT 39.0 12/23/2020   MCV 88.8 12/23/2020   PLT 166 12/23/2020   NEUTROABS 2.4 12/23/2020    CMP  Lab Results  Component Value Date   NA 139 12/09/2020   K 3.8 12/09/2020   CL 104 12/09/2020   CO2 28 12/09/2020   GLUCOSE 118 (H) 12/09/2020   BUN 15 12/09/2020   CREATININE 0.76 12/09/2020   CALCIUM 8.7 (L) 12/09/2020   PROT 6.9 12/09/2020   ALBUMIN 3.8 12/09/2020   AST 28 12/09/2020   ALT 25 12/09/2020   ALKPHOS 71 12/09/2020   BILITOT 0.4 12/09/2020   GFRNONAA >60 12/09/2020    Lab Results  Component Value Date   CEA1 63.2 (H) 09/19/2020   CEA 5.38 (H) 11/11/2020    Medications: I have reviewed the patient's current medications.   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), status post a sigmoid colectomy and partial bladder resection 09/19/2020 2 separate sigmoid colon tumors, 2.5 cm apart, 1/13 lymph nodes positive, tumor extends to adherent soft tissue of the bladder, MSS, normal mismatch repair protein expression tumor mutation burden 0, K-ras G12D CT  abdomen/pelvis 09/19/2020-sigmoid colon mass with colonic obstruction, 6 mm right lower lobe nodule CT chest 09/22/2020-multiple bilateral lung nodules consistent with metastases Elevated preoperative CEA Cycle 1 FOLFOX 10/28/2020 Cycle 2 FOLFOX 11/11/2020 Cycle 3 FOLFOX 11/25/2020 Cycle 4 FOLFOX 12/09/2020 Cycle 5 FOLFOX 12/23/2020   2.  Port-A-Cath placement-Dr. Ninfa Linden 10/07/2020    Disposition: Craig Best appears stable.  He continues to tolerate the chemotherapy well.  Complete cycle 5 FOLFOX today.  He will undergo a restaging chest CT after this cycle.  Craig Best will return for an office visit in 2 weeks.  I recommended he obtain a COVID-19 and pneumonia vaccine.  Betsy Coder, MD  12/23/2020  9:24 AM

## 2020-12-25 ENCOUNTER — Inpatient Hospital Stay: Payer: Medicare Other

## 2020-12-25 ENCOUNTER — Other Ambulatory Visit: Payer: Self-pay

## 2020-12-25 ENCOUNTER — Ambulatory Visit (HOSPITAL_BASED_OUTPATIENT_CLINIC_OR_DEPARTMENT_OTHER)
Admission: RE | Admit: 2020-12-25 | Discharge: 2020-12-25 | Disposition: A | Discharge status: Home / Self Care | Payer: Medicare Other | Source: Ambulatory Visit | Attending: Oncology | Admitting: Oncology

## 2020-12-25 VITALS — BP 140/63 | HR 65 | Temp 98.5°F | Resp 20

## 2020-12-25 DIAGNOSIS — C187 Malignant neoplasm of sigmoid colon: Secondary | ICD-10-CM

## 2020-12-25 MED ORDER — HEPARIN SOD (PORK) LOCK FLUSH 100 UNIT/ML IV SOLN
500.0000 [IU] | Freq: Once | INTRAVENOUS | Status: AC | PRN
  Administered 2020-12-25: 11:00:00 500 [IU]

## 2020-12-25 MED ORDER — SODIUM CHLORIDE 0.9% FLUSH
10.0000 mL | INTRAVENOUS | Status: DC | PRN
  Administered 2020-12-25: 11:00:00 10 mL

## 2021-01-04 ENCOUNTER — Other Ambulatory Visit: Payer: Self-pay | Admitting: Oncology

## 2021-01-06 ENCOUNTER — Inpatient Hospital Stay: Payer: Medicare Other

## 2021-01-06 ENCOUNTER — Inpatient Hospital Stay: Payer: Medicare Other | Admitting: Oncology

## 2021-01-08 ENCOUNTER — Inpatient Hospital Stay: Payer: Medicare Other

## 2021-01-15 ENCOUNTER — Inpatient Hospital Stay: Payer: Medicare Other

## 2021-01-15 ENCOUNTER — Inpatient Hospital Stay: Payer: Medicare Other | Admitting: Oncology

## 2021-01-15 ENCOUNTER — Inpatient Hospital Stay: Payer: Medicare Other | Attending: Oncology | Admitting: Oncology

## 2021-01-15 ENCOUNTER — Encounter: Payer: Self-pay | Admitting: Oncology

## 2021-01-15 ENCOUNTER — Other Ambulatory Visit: Payer: Self-pay

## 2021-01-15 VITALS — BP 138/75 | Temp 97.8°F | Ht 68.0 in | Wt 189.0 lb

## 2021-01-15 DIAGNOSIS — Z5111 Encounter for antineoplastic chemotherapy: Secondary | ICD-10-CM | POA: Insufficient documentation

## 2021-01-15 DIAGNOSIS — Z452 Encounter for adjustment and management of vascular access device: Secondary | ICD-10-CM | POA: Insufficient documentation

## 2021-01-15 DIAGNOSIS — C187 Malignant neoplasm of sigmoid colon: Secondary | ICD-10-CM | POA: Diagnosis present

## 2021-01-15 DIAGNOSIS — R918 Other nonspecific abnormal finding of lung field: Secondary | ICD-10-CM | POA: Diagnosis not present

## 2021-01-15 NOTE — Progress Notes (Addendum)
[facility] OFFICE PROGRESS NOTE   Diagnosis: Colon cancer  INTERVAL HISTORY:   Craig Best completed another cycle of FOLFOX on 12/23/2020.  He developed diarrhea beginning 2 to 3 days following chemotherapy.  The diarrhea lasted for several days.  He also had cold sensitivity following chemotherapy.  The symptoms have resolved.  No numbness at present.  He feels well.  He is working.  Objective:  Vital signs in last 24 hours:  Blood pressure 138/75, temperature 97.8 F (36.6 C), temperature source Oral, height $RemoveBefo'5\' 8"'FauElAihStU$  (1.727 m), weight 189 lb (85.7 kg), SpO2 100 %.    HEENT: No thrush or ulcers Resp: Lungs clear bilaterally Cardio: Regular rate and rhythm GI: No hepatomegaly, left lower quadrant colostomy with brown stool Vascular: No leg edema Neuro: Moderate loss of vibratory sense at the fingertips bilaterally   Portacath/PICC-without erythema  Lab Results:  Lab Results  Component Value Date   WBC 5.5 12/23/2020   HGB 13.4 12/23/2020   HCT 39.0 12/23/2020   MCV 88.8 12/23/2020   PLT 166 12/23/2020   NEUTROABS 2.4 12/23/2020    CMP  Lab Results  Component Value Date   NA 138 12/23/2020   K 3.5 12/23/2020   CL 104 12/23/2020   CO2 27 12/23/2020   GLUCOSE 128 (H) 12/23/2020   BUN 13 12/23/2020   CREATININE 0.76 12/23/2020   CALCIUM 9.3 12/23/2020   PROT 7.0 12/23/2020   ALBUMIN 3.9 12/23/2020   AST 27 12/23/2020   ALT 21 12/23/2020   ALKPHOS 73 12/23/2020   BILITOT 0.4 12/23/2020   GFRNONAA >60 12/23/2020    Lab Results  Component Value Date   CEA1 63.2 (H) 09/19/2020   CEA 4.37 12/23/2020    No results found for: INR, LABPROT  Imaging:  No results found.  Medications: I have reviewed the patient's current medications.   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), status post a sigmoid colectomy and partial bladder resection 09/19/2020 2 separate sigmoid colon tumors, 2.5 cm apart, 1/13 lymph nodes positive,  tumor extends to adherent soft tissue of the bladder, MSS, normal mismatch repair protein expression tumor mutation burden 0, K-ras G12D CT abdomen/pelvis 09/19/2020-sigmoid colon mass with colonic obstruction, 6 mm right lower lobe nodule CT chest 09/22/2020-multiple bilateral lung nodules consistent with metastases Elevated preoperative CEA Cycle 1 FOLFOX 10/28/2020 Cycle 2 FOLFOX 11/11/2020 Cycle 3 FOLFOX 11/25/2020 Cycle 4 FOLFOX 12/09/2020 Cycle 5 FOLFOX 12/23/2020 CT chest 12/25/2020-unchanged lung nodules, 2 cm subpleural nodule in the left posterior chest more prominent-loculated fluid? Cycle 6 FOLFOX 01/15/2021-5-FU dose reduced and bolus eliminated secondary to diarrhea   2.  Port-A-Cath placement-Dr. Ninfa Linden 10/07/2020  3.  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  He had diarrhea following cycle 5.  The 5-FU bolus will be eliminated and the 5-FU infusion will be dose reduced with this cycle.  I reviewed the restaging CT findings and images with Craig Best.  Lung nodules are unchanged.  I still feel it is very likely the lung nodules are indicative of metastatic disease.  However it is possible the nodules are benign.  The plan is to continue FOLFOX and schedule a restaging chest CT at a 86-month interval.  He will return for an office visit in the next cycle of chemotherapy on 02/03/2021.  Craig Best decided against proceeding with chemotherapy until 02/03/2021.  Betsy Coder, MD  01/15/2021  8:07  AM    

## 2021-02-03 ENCOUNTER — Other Ambulatory Visit: Payer: Self-pay

## 2021-02-03 ENCOUNTER — Inpatient Hospital Stay (HOSPITAL_BASED_OUTPATIENT_CLINIC_OR_DEPARTMENT_OTHER): Payer: Medicare Other | Admitting: Nurse Practitioner

## 2021-02-03 ENCOUNTER — Encounter: Payer: Self-pay | Admitting: Nurse Practitioner

## 2021-02-03 ENCOUNTER — Inpatient Hospital Stay: Payer: Medicare Other

## 2021-02-03 VITALS — BP 138/66 | HR 67 | Temp 98.0°F | Resp 20 | Ht 68.0 in | Wt 191.1 lb

## 2021-02-03 DIAGNOSIS — C187 Malignant neoplasm of sigmoid colon: Secondary | ICD-10-CM | POA: Diagnosis not present

## 2021-02-03 DIAGNOSIS — Z5111 Encounter for antineoplastic chemotherapy: Secondary | ICD-10-CM | POA: Diagnosis not present

## 2021-02-03 LAB — CBC WITH DIFFERENTIAL (CANCER CENTER ONLY)
Abs Immature Granulocytes: 0.02 10*3/uL (ref 0.00–0.07)
Basophils Absolute: 0.2 10*3/uL — ABNORMAL HIGH (ref 0.0–0.1)
Basophils Relative: 2 %
Eosinophils Absolute: 0.4 10*3/uL (ref 0.0–0.5)
Eosinophils Relative: 4 %
HCT: 43.9 % (ref 39.0–52.0)
Hemoglobin: 14.8 g/dL (ref 13.0–17.0)
Immature Granulocytes: 0 %
Lymphocytes Relative: 25 %
Lymphs Abs: 2.2 10*3/uL (ref 0.7–4.0)
MCH: 31.2 pg (ref 26.0–34.0)
MCHC: 33.7 g/dL (ref 30.0–36.0)
MCV: 92.4 fL (ref 80.0–100.0)
Monocytes Absolute: 0.8 10*3/uL (ref 0.1–1.0)
Monocytes Relative: 9 %
Neutro Abs: 5.1 10*3/uL (ref 1.7–7.7)
Neutrophils Relative %: 60 %
Platelet Count: 287 10*3/uL (ref 150–400)
RBC: 4.75 MIL/uL (ref 4.22–5.81)
RDW: 16.1 % — ABNORMAL HIGH (ref 11.5–15.5)
WBC Count: 8.7 10*3/uL (ref 4.0–10.5)
nRBC: 0 % (ref 0.0–0.2)

## 2021-02-03 LAB — CMP (CANCER CENTER ONLY)
ALT: 15 U/L (ref 0–44)
AST: 21 U/L (ref 15–41)
Albumin: 4 g/dL (ref 3.5–5.0)
Alkaline Phosphatase: 68 U/L (ref 38–126)
Anion gap: 8 (ref 5–15)
BUN: 15 mg/dL (ref 8–23)
CO2: 27 mmol/L (ref 22–32)
Calcium: 9.4 mg/dL (ref 8.9–10.3)
Chloride: 103 mmol/L (ref 98–111)
Creatinine: 0.79 mg/dL (ref 0.61–1.24)
GFR, Estimated: >60 mL/min (ref >60)
Glucose, Bld: 104 mg/dL — ABNORMAL HIGH (ref 70–99)
Potassium: 3.8 mmol/L (ref 3.5–5.1)
Sodium: 138 mmol/L (ref 135–145)
Total Bilirubin: 0.5 mg/dL (ref 0.3–1.2)
Total Protein: 7.4 g/dL (ref 6.5–8.1)

## 2021-02-03 MED ORDER — DEXTROSE 5 % IV SOLN: Freq: Once | INTRAVENOUS | Status: AC

## 2021-02-03 MED ORDER — OXALIPLATIN CHEMO INJECTION 100 MG/20ML
85.0000 mg/m2 | Freq: Once | INTRAVENOUS | Status: AC
  Administered 2021-02-03: 13:00:00 170 mg via INTRAVENOUS
  Filled 2021-02-03: qty 34

## 2021-02-03 MED ORDER — PALONOSETRON HCL INJECTION 0.25 MG/5ML
0.2500 mg | Freq: Once | INTRAVENOUS | Status: AC
  Administered 2021-02-03: 12:00:00 0.25 mg via INTRAVENOUS
  Filled 2021-02-03: qty 5

## 2021-02-03 MED ORDER — SODIUM CHLORIDE 0.9 % IV SOLN
10.0000 mg | Freq: Once | INTRAVENOUS | Status: AC
  Administered 2021-02-03: 12:00:00 10 mg via INTRAVENOUS
  Filled 2021-02-03: qty 1

## 2021-02-03 MED ORDER — LEUCOVORIN CALCIUM INJECTION 350 MG
300.0000 mg/m2 | Freq: Once | INTRAVENOUS | Status: AC
  Administered 2021-02-03: 13:00:00 600 mg via INTRAVENOUS
  Filled 2021-02-03: qty 30

## 2021-02-03 MED ORDER — SODIUM CHLORIDE 0.9 % IV SOLN
1800.0000 mg/m2 | INTRAVENOUS | Status: DC
  Administered 2021-02-03: 15:00:00 3600 mg via INTRAVENOUS
  Filled 2021-02-03: qty 72

## 2021-02-03 NOTE — Progress Notes (Signed)
[facility] OFFICE PROGRESS NOTE   Diagnosis: Colon cancer  INTERVAL HISTORY:   Craig Best returns as scheduled.  He completed cycle 5 FOLFOX 12/23/2020.  He did not receive cycle 6 on 01/15/2021.  He feels well.  No nausea or vomiting.  No mouth sores.  No diarrhea.  No numbness or tingling in the hands or feet.  Objective:  Vital signs in last 24 hours:  Temperature 98.6, heart rate 77, respirations 20, blood pressure 150/75, weight 191.2 pounds   HEENT: No thrush or ulcers. Resp: Lungs clear bilaterally. Cardio: Regular rate and rhythm. GI: Abdomen soft and nontender.  No hepatomegaly. Vascular: No leg edema. Neuro: Vibratory sense minimally decreased over the fingertips per tuning fork exam. Skin: Palms without erythema. Port-A-Cath without erythema.  Lab Results:  Lab Results  Component Value Date   WBC 8.7 02/03/2021   HGB 14.8 02/03/2021   HCT 43.9 02/03/2021   MCV 92.4 02/03/2021   PLT 287 02/03/2021   NEUTROABS 5.1 02/03/2021    Imaging:  No results found.  Medications: I have reviewed the patient's current medications.  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), status post a sigmoid colectomy and partial bladder resection 09/19/2020 2 separate sigmoid colon tumors, 2.5 cm apart, 1/13 lymph nodes positive, tumor extends to adherent soft tissue of the bladder, MSS, normal mismatch repair protein expression tumor mutation burden 0, K-ras G12D CT abdomen/pelvis 09/19/2020-sigmoid colon mass with colonic obstruction, 6 mm right lower lobe nodule CT chest 09/22/2020-multiple bilateral lung nodules consistent with metastases Elevated preoperative CEA Cycle 1 FOLFOX 10/28/2020 Cycle 2 FOLFOX 11/11/2020 Cycle 3 FOLFOX 11/25/2020 Cycle 4 FOLFOX 12/09/2020 Cycle 5 FOLFOX 12/23/2020 CT chest 12/25/2020-unchanged lung nodules, 2 cm subpleural nodule in the left posterior chest more prominent-loculated fluid? Cycle 6 FOLFOX  02/03/2021-5-FU dose reduced and bolus eliminated secondary to diarrhea   2.  Port-A-Cath placement-Dr. 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  He has completed 5 cycles of FOLFOX.  Plan to proceed with cycle 6 today as scheduled.  5-FU dose reduced and bolus eliminated secondary to diarrhea after cycle 5.  CBC and chemistry panel reviewed.  Labs adequate to proceed with treatment.  He will return for lab, follow-up, cycle 7 FOLFOX in 2 weeks.  He will contact the office in the interim with any problems.    Ned Card ANP/GNP-BC   02/03/2021  11:42 AM

## 2021-02-03 NOTE — Progress Notes (Signed)
Patient presents for treatment. RN assessment completed along with the following:  Labs/vitals reviewed - Yes, and within treatment parameters.   Weight within 10% of previous measurement - Yes Oncology Treatment Attestation completed for current therapy- Yes, on date 10/17/2020 Informed consent completed and reflects current therapy/intent - Yes, on date 10/28/2020             Provider progress note reviewed - Today's provider note is not yet available. I reviewed the most recent oncology provider progress note in chart dated 01/15/2021. Treatment/Antibody/Supportive plan reviewed - Yes, and there are no adjustments needed for today's treatment. S&H and other orders reviewed - Yes, and there are no additional orders identified. Previous treatment date reviewed - Yes, and the appropriate amount of time has elapsed between treatments. Clinic Hand Off Received from - Yes from Lattie Haw, NP  Patient to proceed with treatment.

## 2021-02-03 NOTE — Patient Instructions (Addendum)
[place]   Discharge Instructions: Thank you for choosing [facility] to provide your oncology and hematology care.   If you have a lab appointment with the [facility], please go directly to the [facility] and check in at the registration area.   Wear comfortable clothing and clothing appropriate for easy access to any Portacath or PICC line.   We strive to give you quality time with your provider. You may need to reschedule your appointment if you arrive late (15 or more minutes).  Arriving late affects you and other patients whose appointments are after yours.  Also, if you miss three or more appointments without notifying the office, you may be dismissed from the clinic at the providers discretion.      For prescription refill requests, have your pharmacy contact our office and allow 72 hours for refills to be completed.    Today you received the following chemotherapy and/or immunotherapy agents Oxaliplatin (ELOXATIN), Leucovorin & Flourouracil (ADRUCIL).      To help prevent nausea and vomiting after your treatment, we encourage you to take your nausea medication as directed.  BELOW ARE SYMPTOMS THAT SHOULD BE REPORTED IMMEDIATELY: *FEVER GREATER THAN 100.4 F (38 C) OR HIGHER *CHILLS OR SWEATING *NAUSEA AND VOMITING THAT IS NOT CONTROLLED WITH YOUR NAUSEA MEDICATION *UNUSUAL SHORTNESS OF BREATH *UNUSUAL BRUISING OR BLEEDING *URINARY PROBLEMS (pain or burning when urinating, or frequent urination) *BOWEL PROBLEMS (unusual diarrhea, constipation, pain near the anus) TENDERNESS IN MOUTH AND THROAT WITH OR WITHOUT PRESENCE OF ULCERS (sore throat, sores in mouth, or a toothache) UNUSUAL RASH, SWELLING OR PAIN  UNUSUAL VAGINAL DISCHARGE OR ITCHING   Items with * indicate a potential emergency and should be followed up as soon as possible or go to the Emergency Department if any problems should occur.  Please show the CHEMOTHERAPY ALERT  CARD or IMMUNOTHERAPY ALERT CARD at check-in to the Emergency Department and triage nurse.  Should you have questions after your visit or need to cancel or reschedule your appointment, please contact [facility]  Dept: [phone]  and follow the prompts.  Office hours are 8:00 a.m. to 4:30 p.m. Monday - Friday. Please note that voicemails left after 4:00 p.m. may not be returned until the following business day.  We are closed weekends and major holidays. You have access to a nurse at all times for urgent questions. Please call the main number to the clinic Dept: [phone] and follow the prompts.   For any non-urgent questions, you may also contact your provider using MyChart. We now offer e-Visits for anyone 23 and older to request care online for non-urgent symptoms. For details visit [email or web address].   Also download the MyChart app! Go to the app store, search "MyChart", open the app, select [facility], and log in with your MyChart username and password.  Due to Covid, a mask is required upon entering the hospital/clinic. If you do not have a mask, one will be given to you upon arrival. For doctor visits, patients may have 1 support person aged 59 or older with them. For treatment visits, patients cannot have anyone with them due to current Covid guidelines and our immunocompromised population.    Oxaliplatin Injection What is this medication? OXALIPLATIN (ox AL i PLA tin) is a chemotherapy drug. It targets fast dividing cells, like cancer cells, and causes these cells to die. This medicine is used to treat cancers of the colon and rectum,  and many other cancers. This medicine may be used for other purposes; ask your health care provider or pharmacist if you have questions. COMMON BRAND NAME(S): Eloxatin What should I tell my care team before I take this medication? They need to know if you have any of these conditions: heart disease history of  irregular heartbeat liver disease low blood counts, like white cells, platelets, or red blood cells lung or breathing disease, like asthma take medicines that treat or prevent blood clots tingling of the fingers or toes, or other nerve disorder an unusual or allergic reaction to oxaliplatin, other chemotherapy, other medicines, foods, dyes, or preservatives pregnant or trying to get pregnant breast-feeding How should I use this medication? This drug is given as an infusion into a vein. It is administered in a hospital or clinic by a specially trained health care professional. Talk to your pediatrician regarding the use of this medicine in children. Special care may be needed. Overdosage: If you think you have taken too much of this medicine contact a poison control center or emergency room at once. NOTE: This medicine is only for you. Do not share this medicine with others. What if I miss a dose? It is important not to miss a dose. Call your doctor or health care professional if you are unable to keep an appointment. What may interact with this medication? Do not take this medicine with any of the following medications: cisapride dronedarone pimozide thioridazine This medicine may also interact with the following medications: aspirin and aspirin-like medicines certain medicines that treat or prevent blood clots like warfarin, apixaban, dabigatran, and rivaroxaban cisplatin cyclosporine diuretics medicines for infection like acyclovir, adefovir, amphotericin B, bacitracin, cidofovir, foscarnet, ganciclovir, gentamicin, pentamidine, vancomycin NSAIDs, medicines for pain and inflammation, like ibuprofen or naproxen other medicines that prolong the QT interval (an abnormal heart rhythm) pamidronate zoledronic acid This list may not describe all possible interactions. Give your health care provider a list of all the medicines, herbs, non-prescription drugs, or dietary supplements you use.  Also tell them if you smoke, drink alcohol, or use illegal drugs. Some items may interact with your medicine. What should I watch for while using this medication? Your condition will be monitored carefully while you are receiving this medicine. You may need blood work done while you are taking this medicine. This medicine may make you feel generally unwell. This is not uncommon as chemotherapy can affect healthy cells as well as cancer cells. Report any side effects. Continue your course of treatment even though you feel ill unless your healthcare professional tells you to stop. This medicine can make you more sensitive to cold. Do not drink cold drinks or use ice. Cover exposed skin before coming in contact with cold temperatures or cold objects. When out in cold weather wear warm clothing and cover your mouth and nose to warm the air that goes into your lungs. Tell your doctor if you get sensitive to the cold. Do not become pregnant while taking this medicine or for 9 months after stopping it. Women should inform their health care professional if they wish to become pregnant or think they might be pregnant. Men should not father a child while taking this medicine and for 6 months after stopping it. There is potential for serious side effects to an unborn child. Talk to your health care professional for more information. Do not breast-feed a child while taking this medicine or for 3 months after stopping it. This medicine has caused ovarian failure  in some women. This medicine may make it more difficult to get pregnant. Talk to your health care professional if you are concerned about your fertility. This medicine has caused decreased sperm counts in some men. This may make it more difficult to father a child. Talk to your health care professional if you are concerned about your fertility. This medicine may increase your risk of getting an infection. Call your health care professional for advice if you get  a fever, chills, or sore throat, or other symptoms of a cold or flu. Do not treat yourself. Try to avoid being around people who are sick. Avoid taking medicines that contain aspirin, acetaminophen, ibuprofen, naproxen, or ketoprofen unless instructed by your health care professional. These medicines may hide a fever. Be careful brushing or flossing your teeth or using a toothpick because you may get an infection or bleed more easily. If you have any dental work done, tell your dentist you are receiving this medicine. What side effects may I notice from receiving this medication? Side effects that you should report to your doctor or health care professional as soon as possible: allergic reactions like skin rash, itching or hives, swelling of the face, lips, or tongue breathing problems cough low blood counts - this medicine may decrease the number of white blood cells, red blood cells, and platelets. You may be at increased risk for infections and bleeding nausea, vomiting pain, redness, or irritation at site where injected pain, tingling, numbness in the hands or feet signs and symptoms of bleeding such as bloody or black, tarry stools; red or dark brown urine; spitting up blood or brown material that looks like coffee grounds; red spots on the skin; unusual bruising or bleeding from the eyes, gums, or nose signs and symptoms of a dangerous change in heartbeat or heart rhythm like chest pain; dizziness; fast, irregular heartbeat; palpitations; feeling faint or lightheaded; falls signs and symptoms of infection like fever; chills; cough; sore throat; pain or trouble passing urine signs and symptoms of liver injury like dark yellow or brown urine; general ill feeling or flu-like symptoms; light-colored stools; loss of appetite; nausea; right upper belly pain; unusually weak or tired; yellowing of the eyes or skin signs and symptoms of low red blood cells or anemia such as unusually weak or tired;  feeling faint or lightheaded; falls signs and symptoms of muscle injury like dark urine; trouble passing urine or change in the amount of urine; unusually weak or tired; muscle pain; back pain Side effects that usually do not require medical attention (report to your doctor or health care professional if they continue or are bothersome): changes in taste diarrhea gas hair loss loss of appetite mouth sores This list may not describe all possible side effects. Call your doctor for medical advice about side effects. You may report side effects to FDA at 1-800-FDA-1088. Where should I keep my medication? This drug is given in a hospital or clinic and will not be stored at home. NOTE: This sheet is a summary. It may not cover all possible information. If you have questions about this medicine, talk to your doctor, pharmacist, or health care provider.  2022 Elsevier/Gold Standard (2020-10-14 00:00:00)   Leucovorin injection What is this medication? LEUCOVORIN (loo koe VOR in) is used to prevent or treat the harmful effects of some medicines. This medicine is used to treat anemia caused by a low amount of folic acid in the body. It is also used with 5-fluorouracil (5-FU) to  treat colon cancer. This medicine may be used for other purposes; ask your health care provider or pharmacist if you have questions. What should I tell my care team before I take this medication? They need to know if you have any of these conditions: anemia from low levels of vitamin B-12 in the blood an unusual or allergic reaction to leucovorin, folic acid, other medicines, foods, dyes, or preservatives pregnant or trying to get pregnant breast-feeding How should I use this medication? This medicine is for injection into a muscle or into a vein. It is given by a health care professional in a hospital or clinic setting. Talk to your pediatrician regarding the use of this medicine in children. Special care may be  needed. Overdosage: If you think you have taken too much of this medicine contact a poison control center or emergency room at once. NOTE: This medicine is only for you. Do not share this medicine with others. What if I miss a dose? This does not apply. What may interact with this medication? capecitabine fluorouracil phenobarbital phenytoin primidone trimethoprim-sulfamethoxazole This list may not describe all possible interactions. Give your health care provider a list of all the medicines, herbs, non-prescription drugs, or dietary supplements you use. Also tell them if you smoke, drink alcohol, or use illegal drugs. Some items may interact with your medicine. What should I watch for while using this medication? Your condition will be monitored carefully while you are receiving this medicine. This medicine may increase the side effects of 5-fluorouracil, 5-FU. Tell your doctor or health care professional if you have diarrhea or mouth sores that do not get better or that get worse. What side effects may I notice from receiving this medication? Side effects that you should report to your doctor or health care professional as soon as possible: allergic reactions like skin rash, itching or hives, swelling of the face, lips, or tongue breathing problems fever, infection mouth sores unusual bleeding or bruising unusually weak or tired Side effects that usually do not require medical attention (report to your doctor or health care professional if they continue or are bothersome): constipation or diarrhea loss of appetite nausea, vomiting This list may not describe all possible side effects. Call your doctor for medical advice about side effects. You may report side effects to FDA at 1-800-FDA-1088. Where should I keep my medication? This drug is given in a hospital or clinic and will not be stored at home. NOTE: This sheet is a summary. It may not cover all possible information. If you have  questions about this medicine, talk to your doctor, pharmacist, or health care provider.  2022 Elsevier/Gold Standard (2007-08-03 00:00:00)   Fluorouracil, 5-FU injection What is this medication? FLUOROURACIL, 5-FU (flure oh YOOR a sil) is a chemotherapy drug. It slows the growth of cancer cells. This medicine is used to treat many types of cancer like breast cancer, colon or rectal cancer, pancreatic cancer, and stomach cancer. This medicine may be used for other purposes; ask your health care provider or pharmacist if you have questions. COMMON BRAND NAME(S): Adrucil What should I tell my care team before I take this medication? They need to know if you have any of these conditions: blood disorders dihydropyrimidine dehydrogenase (DPD) deficiency infection (especially a virus infection such as chickenpox, cold sores, or herpes) kidney disease liver disease malnourished, poor nutrition recent or ongoing radiation therapy an unusual or allergic reaction to fluorouracil, other chemotherapy, other medicines, foods, dyes, or preservatives pregnant or trying  to get pregnant breast-feeding How should I use this medication? This drug is given as an infusion or injection into a vein. It is administered in a hospital or clinic by a specially trained health care professional. Talk to your pediatrician regarding the use of this medicine in children. Special care may be needed. Overdosage: If you think you have taken too much of this medicine contact a poison control center or emergency room at once. NOTE: This medicine is only for you. Do not share this medicine with others. What if I miss a dose? It is important not to miss your dose. Call your doctor or health care professional if you are unable to keep an appointment. What may interact with this medication? Do not take this medicine with any of the following medications: live virus vaccines This medicine may also interact with the following  medications: medicines that treat or prevent blood clots like warfarin, enoxaparin, and dalteparin This list may not describe all possible interactions. Give your health care provider a list of all the medicines, herbs, non-prescription drugs, or dietary supplements you use. Also tell them if you smoke, drink alcohol, or use illegal drugs. Some items may interact with your medicine. What should I watch for while using this medication? Visit your doctor for checks on your progress. This drug may make you feel generally unwell. This is not uncommon, as chemotherapy can affect healthy cells as well as cancer cells. Report any side effects. Continue your course of treatment even though you feel ill unless your doctor tells you to stop. In some cases, you may be given additional medicines to help with side effects. Follow all directions for their use. Call your doctor or health care professional for advice if you get a fever, chills or sore throat, or other symptoms of a cold or flu. Do not treat yourself. This drug decreases your body's ability to fight infections. Try to avoid being around people who are sick. This medicine may increase your risk to bruise or bleed. Call your doctor or health care professional if you notice any unusual bleeding. Be careful brushing and flossing your teeth or using a toothpick because you may get an infection or bleed more easily. If you have any dental work done, tell your dentist you are receiving this medicine. Avoid taking products that contain aspirin, acetaminophen, ibuprofen, naproxen, or ketoprofen unless instructed by your doctor. These medicines may hide a fever. Do not become pregnant while taking this medicine. Women should inform their doctor if they wish to become pregnant or think they might be pregnant. There is a potential for serious side effects to an unborn child. Talk to your health care professional or pharmacist for more information. Do not breast-feed  an infant while taking this medicine. Men should inform their doctor if they wish to father a child. This medicine may lower sperm counts. Do not treat diarrhea with over the counter products. Contact your doctor if you have diarrhea that lasts more than 2 days or if it is severe and watery. This medicine can make you more sensitive to the sun. Keep out of the sun. If you cannot avoid being in the sun, wear protective clothing and use sunscreen. Do not use sun lamps or tanning beds/booths. What side effects may I notice from receiving this medication? Side effects that you should report to your doctor or health care professional as soon as possible: allergic reactions like skin rash, itching or hives, swelling of the face, lips, or  tongue low blood counts - this medicine may decrease the number of white blood cells, red blood cells and platelets. You may be at increased risk for infections and bleeding. signs of infection - fever or chills, cough, sore throat, pain or difficulty passing urine signs of decreased platelets or bleeding - bruising, pinpoint red spots on the skin, black, tarry stools, blood in the urine signs of decreased red blood cells - unusually weak or tired, fainting spells, lightheadedness breathing problems changes in vision chest pain mouth sores nausea and vomiting pain, swelling, redness at site where injected pain, tingling, numbness in the hands or feet redness, swelling, or sores on hands or feet stomach pain unusual bleeding Side effects that usually do not require medical attention (report to your doctor or health care professional if they continue or are bothersome): changes in finger or toe nails diarrhea dry or itchy skin hair loss headache loss of appetite sensitivity of eyes to the light stomach upset unusually teary eyes This list may not describe all possible side effects. Call your doctor for medical advice about side effects. You may report side  effects to FDA at 1-800-FDA-1088. Where should I keep my medication? This drug is given in a hospital or clinic and will not be stored at home. NOTE: This sheet is a summary. It may not cover all possible information. If you have questions about this medicine, talk to your doctor, pharmacist, or health care provider.  2022 Elsevier/Gold Standard (2020-10-14 00:00:00)   The chemotherapy medication bag should finish at 46 hours, 96 hours, or 7 days. For example, if your pump is scheduled for 46 hours and it was put on at 4:00 p.m., it should finish at 2:00 p.m. the day it is scheduled to come off regardless of your appointment time.     Estimated time to finish at 1:00 p.m. on Thursday 02/05/2021.   If the display on your pump reads "Low Volume" and it is beeping, take the batteries out of the pump and come to the cancer center for it to be taken off.   If the pump alarms go off prior to the pump reading "Low Volume" then call [phone] and someone can assist you.  If the plunger comes out and the chemotherapy medication is leaking out, please use your home chemo spill kit to clean up the spill. Do NOT use paper towels or other household products.  If you have problems or questions regarding your pump, please call either [phone] (24 hours a day) or the cancer center Monday-Friday 8:00 a.m.- 4:30 p.m. at the clinic number and we will assist you. If you are unable to get assistance, then go to the nearest Emergency Department and ask the staff to contact the IV team for assistance.

## 2021-02-05 ENCOUNTER — Inpatient Hospital Stay: Payer: Medicare Other

## 2021-02-05 ENCOUNTER — Other Ambulatory Visit: Payer: Self-pay

## 2021-02-05 VITALS — BP 134/61 | HR 66 | Temp 97.9°F | Resp 20

## 2021-02-05 DIAGNOSIS — C187 Malignant neoplasm of sigmoid colon: Secondary | ICD-10-CM

## 2021-02-05 DIAGNOSIS — Z5111 Encounter for antineoplastic chemotherapy: Secondary | ICD-10-CM | POA: Diagnosis not present

## 2021-02-05 MED ORDER — HEPARIN SOD (PORK) LOCK FLUSH 100 UNIT/ML IV SOLN
500.0000 [IU] | Freq: Once | INTRAVENOUS | Status: AC | PRN
  Administered 2021-02-05: 13:00:00 500 [IU]

## 2021-02-05 MED ORDER — SODIUM CHLORIDE 0.9% FLUSH
10.0000 mL | INTRAVENOUS | Status: DC | PRN
  Administered 2021-02-05: 13:00:00 10 mL

## 2021-02-10 ENCOUNTER — Encounter: Payer: Self-pay | Admitting: Oncology

## 2021-02-15 ENCOUNTER — Other Ambulatory Visit: Payer: Self-pay | Admitting: Oncology

## 2021-02-17 ENCOUNTER — Inpatient Hospital Stay: Payer: Medicare Other

## 2021-02-17 ENCOUNTER — Other Ambulatory Visit: Payer: Self-pay

## 2021-02-17 ENCOUNTER — Inpatient Hospital Stay (HOSPITAL_BASED_OUTPATIENT_CLINIC_OR_DEPARTMENT_OTHER): Payer: Medicare Other | Admitting: Oncology

## 2021-02-17 ENCOUNTER — Inpatient Hospital Stay: Payer: Medicare Other | Attending: Oncology

## 2021-02-17 VITALS — BP 145/75 | HR 61 | Temp 98.6°F | Resp 20

## 2021-02-17 VITALS — BP 149/66 | HR 61 | Temp 97.7°F | Resp 18 | Ht 68.0 in | Wt 192.0 lb

## 2021-02-17 DIAGNOSIS — Z452 Encounter for adjustment and management of vascular access device: Secondary | ICD-10-CM | POA: Diagnosis not present

## 2021-02-17 DIAGNOSIS — C7801 Secondary malignant neoplasm of right lung: Secondary | ICD-10-CM | POA: Insufficient documentation

## 2021-02-17 DIAGNOSIS — Z5111 Encounter for antineoplastic chemotherapy: Secondary | ICD-10-CM | POA: Insufficient documentation

## 2021-02-17 DIAGNOSIS — C187 Malignant neoplasm of sigmoid colon: Secondary | ICD-10-CM

## 2021-02-17 DIAGNOSIS — C7802 Secondary malignant neoplasm of left lung: Secondary | ICD-10-CM | POA: Diagnosis not present

## 2021-02-17 LAB — CBC WITH DIFFERENTIAL (CANCER CENTER ONLY)
Abs Immature Granulocytes: 0.01 10*3/uL (ref 0.00–0.07)
Basophils Absolute: 0.1 10*3/uL (ref 0.0–0.1)
Basophils Relative: 2 %
Eosinophils Absolute: 0.3 10*3/uL (ref 0.0–0.5)
Eosinophils Relative: 4 %
HCT: 42 % (ref 39.0–52.0)
Hemoglobin: 14.3 g/dL (ref 13.0–17.0)
Immature Granulocytes: 0 %
Lymphocytes Relative: 26 %
Lymphs Abs: 1.7 10*3/uL (ref 0.7–4.0)
MCH: 31.6 pg (ref 26.0–34.0)
MCHC: 34 g/dL (ref 30.0–36.0)
MCV: 92.7 fL (ref 80.0–100.0)
Monocytes Absolute: 0.7 10*3/uL (ref 0.1–1.0)
Monocytes Relative: 11 %
Neutro Abs: 3.6 10*3/uL (ref 1.7–7.7)
Neutrophils Relative %: 57 %
Platelet Count: 226 10*3/uL (ref 150–400)
RBC: 4.53 MIL/uL (ref 4.22–5.81)
RDW: 15.3 % (ref 11.5–15.5)
WBC Count: 6.4 10*3/uL (ref 4.0–10.5)
nRBC: 0 % (ref 0.0–0.2)

## 2021-02-17 LAB — CMP (CANCER CENTER ONLY)
ALT: 21 U/L (ref 0–44)
AST: 26 U/L (ref 15–41)
Albumin: 3.9 g/dL (ref 3.5–5.0)
Alkaline Phosphatase: 80 U/L (ref 38–126)
Anion gap: 8 (ref 5–15)
BUN: 13 mg/dL (ref 8–23)
CO2: 26 mmol/L (ref 22–32)
Calcium: 9.4 mg/dL (ref 8.9–10.3)
Chloride: 104 mmol/L (ref 98–111)
Creatinine: 0.75 mg/dL (ref 0.61–1.24)
GFR, Estimated: >60 mL/min (ref >60)
Glucose, Bld: 109 mg/dL — ABNORMAL HIGH (ref 70–99)
Potassium: 3.8 mmol/L (ref 3.5–5.1)
Sodium: 138 mmol/L (ref 135–145)
Total Bilirubin: 0.5 mg/dL (ref 0.3–1.2)
Total Protein: 7.4 g/dL (ref 6.5–8.1)

## 2021-02-17 LAB — CEA (ACCESS): CEA (CHCC): 4.74 ng/mL (ref 0.00–5.00)

## 2021-02-17 MED ORDER — DEXTROSE 5 % IV SOLN: Freq: Once | INTRAVENOUS | Status: AC

## 2021-02-17 MED ORDER — LEUCOVORIN CALCIUM INJECTION 350 MG
300.0000 mg/m2 | Freq: Once | INTRAVENOUS | Status: AC
  Administered 2021-02-17: 600 mg via INTRAVENOUS
  Filled 2021-02-17: qty 30

## 2021-02-17 MED ORDER — PALONOSETRON HCL INJECTION 0.25 MG/5ML
0.2500 mg | Freq: Once | INTRAVENOUS | Status: AC
  Administered 2021-02-17: 0.25 mg via INTRAVENOUS
  Filled 2021-02-17: qty 5

## 2021-02-17 MED ORDER — SODIUM CHLORIDE 0.9 % IV SOLN
1800.0000 mg/m2 | INTRAVENOUS | Status: DC
  Administered 2021-02-17: 3600 mg via INTRAVENOUS
  Filled 2021-02-17: qty 72

## 2021-02-17 MED ORDER — SODIUM CHLORIDE 0.9 % IV SOLN
10.0000 mg | Freq: Once | INTRAVENOUS | Status: AC
  Administered 2021-02-17: 10 mg via INTRAVENOUS
  Filled 2021-02-17: qty 10

## 2021-02-17 MED ORDER — OXALIPLATIN CHEMO INJECTION 100 MG/20ML
85.0000 mg/m2 | Freq: Once | INTRAVENOUS | Status: AC
  Administered 2021-02-17: 170 mg via INTRAVENOUS
  Filled 2021-02-17: qty 34

## 2021-02-17 NOTE — Progress Notes (Signed)
Entered in error

## 2021-02-17 NOTE — Progress Notes (Signed)
[facility] OFFICE PROGRESS NOTE   Diagnosis: Colon cancer  INTERVAL HISTORY:   [facility] as scheduled.  He completed another cycle of FOLFOX 02/03/2021.  No nausea, mouth sores, or diarrhea following chemotherapy.  No cold sensitivity or neuropathy symptoms.  He reports increased malaise following this cycle of chemotherapy.  Good appetite.  Objective:  Vital signs in last 24 hours:  Blood pressure (!) 149/66, pulse 61, temperature 97.7 F (36.5 C), temperature source Oral, resp. rate 18, height _0  (1.727 m), weight 192 lb (87.1 kg), SpO2 100 %.    HEENT: No thrush or ulcers Resp: Lungs clear bilaterally Cardio: Regular rate and rhythm GI: No hepatosplenomegaly, left lower quadrant colostomy Vascular: No leg edema Neuro: Mild loss of vibratory sense at the fingertips bilaterally Skin: Palms without erythema  Portacath/PICC-without erythema  Lab Results:  Lab Results  Component Value Date   WBC 6.4 02/17/2021   HGB 14.3 02/17/2021   HCT 42.0 02/17/2021   MCV 92.7 02/17/2021   PLT 226 02/17/2021   NEUTROABS 3.6 02/17/2021    CMP  Lab Results  Component Value Date   NA 138 02/17/2021   K 3.8 02/17/2021   CL 104 02/17/2021   CO2 26 02/17/2021   GLUCOSE 109 (H) 02/17/2021   BUN 13 02/17/2021   CREATININE 0.75 02/17/2021   CALCIUM 9.4 02/17/2021   PROT 7.4 02/17/2021   ALBUMIN 3.9 02/17/2021   AST 26 02/17/2021   ALT 21 02/17/2021   ALKPHOS 80 02/17/2021   BILITOT 0.5 02/17/2021   GFRNONAA >60 02/17/2021    Lab Results  Component Value Date   CEA1 63.2 (H) 09/19/2020   CEA 4.37 12/23/2020    Medications: I have reviewed the patient's current medications.   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), status post a sigmoid colectomy and partial bladder resection 09/19/2020 2 separate sigmoid colon tumors, 2.5 cm apart, 1/13 lymph nodes positive, tumor extends to adherent soft tissue of the bladder, MSS,  normal mismatch repair protein expression tumor mutation burden 0, K-ras G12D CT abdomen/pelvis 09/19/2020-sigmoid colon mass with colonic obstruction, 6 mm right lower lobe nodule CT chest 09/22/2020-multiple bilateral lung nodules consistent with metastases Elevated preoperative CEA Cycle 1 FOLFOX 10/28/2020 Cycle 2 FOLFOX 11/11/2020 Cycle 3 FOLFOX 11/25/2020 Cycle 4 FOLFOX 12/09/2020 Cycle 5 FOLFOX 12/23/2020 CT chest 12/25/2020-unchanged lung nodules, 2 cm subpleural nodule in the left posterior chest more prominent-loculated fluid? Cycle 6 FOLFOX 02/03/2021-5-FU dose reduced and bolus eliminated secondary to diarrhea Cycle 7 FOLFOX 02/17/2021   2.  Port-A-Cath placement-Dr. 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  He continues to tolerate chemotherapy well.  He will complete another cycle of FOLFOX today.  He will return for an office visit and chemotherapy in 2 weeks.  The plan is to refer him for a staging PET scan after cycle 8 FOLFOX.  [facility] Coder, MD  02/17/2021  9:15 AM

## 2021-02-17 NOTE — Progress Notes (Signed)
Patient seen by Dr. Sherrill today ? ?Vitals are within treatment parameters. ? ?Labs reviewed by Dr. Sherrill and are within treatment parameters. ? ?Per physician team, patient is ready for treatment and there are NO modifications to the treatment plan.  ?

## 2021-02-17 NOTE — Progress Notes (Signed)
Patient presents for treatment. RN assessment completed along with the following:  Labs/vitals reviewed - Yes, and within treatment parameters.   Weight within 10% of previous measurement - Yes Oncology Treatment Attestation completed for current therapy- Yes, on date 10/17/2020 Informed consent completed and reflects current therapy/intent - Yes, on date 10/28/2020             Provider progress note reviewed - Yes, today's provider note was reviewed. Treatment/Antibody/Supportive plan reviewed - Yes, and there are no adjustments needed for today's treatment. S&H and other orders reviewed - Yes, and there are no additional orders identified. Previous treatment date reviewed - Yes, and the appropriate amount of time has elapsed between treatments. Clinic Hand Off Received from - Yes from [facility], RN  Patient to proceed with treatment.

## 2021-02-17 NOTE — Patient Instructions (Signed)
[facility]   °Discharge Instructions: °Thank you for choosing [facility] to provide your oncology and hematology care.  ° °If you have a lab appointment with the Cancer Center, please go directly to the Cancer Center and check in at the registration area. °  °Wear comfortable clothing and clothing appropriate for easy access to any Portacath or PICC line.  ° °We strive to give you quality time with your provider. You may need to reschedule your appointment if you arrive late (15 or more minutes).  Arriving late affects you and other patients whose appointments are after yours.  Also, if you miss three or more appointments without notifying the office, you may be dismissed from the clinic at the provider’s discretion.    °  °For prescription refill requests, have your pharmacy contact our office and allow 72 hours for refills to be completed.   ° °Today you received the following chemotherapy and/or immunotherapy agents Oxaliplatin (ELOXATIN), Leucovorin & Flourouracil (ADRUCIL).    °  °To help prevent nausea and vomiting after your treatment, we encourage you to take your nausea medication as directed. ° °BELOW ARE SYMPTOMS THAT SHOULD BE REPORTED IMMEDIATELY: °*FEVER GREATER THAN 100.4 F (38 °C) OR HIGHER °*CHILLS OR SWEATING °*NAUSEA AND VOMITING THAT IS NOT CONTROLLED WITH YOUR NAUSEA MEDICATION °*UNUSUAL SHORTNESS OF BREATH °*UNUSUAL BRUISING OR BLEEDING °*URINARY PROBLEMS (pain or burning when urinating, or frequent urination) °*BOWEL PROBLEMS (unusual diarrhea, constipation, pain near the anus) °TENDERNESS IN MOUTH AND THROAT WITH OR WITHOUT PRESENCE OF ULCERS (sore throat, sores in mouth, or a toothache) °UNUSUAL RASH, SWELLING OR PAIN  °UNUSUAL VAGINAL DISCHARGE OR ITCHING  ° °Items with * indicate a potential emergency and should be followed up as soon as possible or go to the Emergency Department if any problems should occur. ° °Please show the CHEMOTHERAPY ALERT  CARD or IMMUNOTHERAPY ALERT CARD at check-in to the Emergency Department and triage nurse. ° °Should you have questions after your visit or need to cancel or reschedule your appointment, please contact [facility]  Dept: [phone]  and follow the prompts.  Office hours are 8:00 a.m. to 4:30 p.m. Monday - Friday. Please note that voicemails left after 4:00 p.m. may not be returned until the following business day.  We are closed weekends and major holidays. You have access to a nurse at all times for urgent questions. Please call the main number to the clinic Dept: [phone] and follow the prompts. ° ° °For any non-urgent questions, you may also contact your provider using MyChart. We now offer e-Visits for anyone 18 and older to request care online for non-urgent symptoms. For details visit [email or web address]. °  °Also download the MyChart app! Go to the app store, search "MyChart", open the app, select [facility], and log in with your MyChart username and password. ° °Due to Covid, a mask is required upon entering the hospital/clinic. If you do not have a mask, one will be given to you upon arrival. For doctor visits, patients may have 1 support person aged 18 or older with them. For treatment visits, patients cannot have anyone with them due to current Covid guidelines and our immunocompromised population.  ° °Oxaliplatin Injection °What is this medication? °OXALIPLATIN (ox AL i PLA tin) is a chemotherapy drug. It targets fast dividing cells, like cancer cells, and causes these cells to die. This medicine is used to treat cancers of the colon and rectum, and   many other cancers. This medicine may be used for other purposes; ask your health care provider or pharmacist if you have questions. COMMON BRAND NAME(S): Eloxatin What should I tell my care team before I take this medication? They need to know if you have any of these conditions: heart disease history of irregular  heartbeat liver disease low blood counts, like white cells, platelets, or red blood cells lung or breathing disease, like asthma take medicines that treat or prevent blood clots tingling of the fingers or toes, or other nerve disorder an unusual or allergic reaction to oxaliplatin, other chemotherapy, other medicines, foods, dyes, or preservatives pregnant or trying to get pregnant breast-feeding How should I use this medication? This drug is given as an infusion into a vein. It is administered in a hospital or clinic by a specially trained health care professional. Talk to your pediatrician regarding the use of this medicine in children. Special care may be needed. Overdosage: If you think you have taken too much of this medicine contact a poison control center or emergency room at once. NOTE: This medicine is only for you. Do not share this medicine with others. What if I miss a dose? It is important not to miss a dose. Call your doctor or health care professional if you are unable to keep an appointment. What may interact with this medication? Do not take this medicine with any of the following medications: cisapride dronedarone pimozide thioridazine This medicine may also interact with the following medications: aspirin and aspirin-like medicines certain medicines that treat or prevent blood clots like warfarin, apixaban, dabigatran, and rivaroxaban cisplatin cyclosporine diuretics medicines for infection like acyclovir, adefovir, amphotericin B, bacitracin, cidofovir, foscarnet, ganciclovir, gentamicin, pentamidine, vancomycin NSAIDs, medicines for pain and inflammation, like ibuprofen or naproxen other medicines that prolong the QT interval (an abnormal heart rhythm) pamidronate zoledronic acid This list may not describe all possible interactions. Give your health care provider a list of all the medicines, herbs, non-prescription drugs, or dietary supplements you use. Also tell  them if you smoke, drink alcohol, or use illegal drugs. Some items may interact with your medicine. What should I watch for while using this medication? Your condition will be monitored carefully while you are receiving this medicine. You may need blood work done while you are taking this medicine. This medicine may make you feel generally unwell. This is not uncommon as chemotherapy can affect healthy cells as well as cancer cells. Report any side effects. Continue your course of treatment even though you feel ill unless your healthcare professional tells you to stop. This medicine can make you more sensitive to cold. Do not drink cold drinks or use ice. Cover exposed skin before coming in contact with cold temperatures or cold objects. When out in cold weather wear warm clothing and cover your mouth and nose to warm the air that goes into your lungs. Tell your doctor if you get sensitive to the cold. Do not become pregnant while taking this medicine or for 9 months after stopping it. Women should inform their health care professional if they wish to become pregnant or think they might be pregnant. Men should not father a child while taking this medicine and for 6 months after stopping it. There is potential for serious side effects to an unborn child. Talk to your health care professional for more information. Do not breast-feed a child while taking this medicine or for 3 months after stopping it. This medicine has caused ovarian failure in  some women. This medicine may make it more difficult to get pregnant. Talk to your health care professional if you are concerned about your fertility. °This medicine has caused decreased sperm counts in some men. This may make it more difficult to father a child. Talk to your health care professional if you are concerned about your fertility. °This medicine may increase your risk of getting an infection. Call your health care professional for advice if you get a fever,  chills, or sore throat, or other symptoms of a cold or flu. Do not treat yourself. Try to avoid being around people who are sick. °Avoid taking medicines that contain aspirin, acetaminophen, ibuprofen, naproxen, or ketoprofen unless instructed by your health care professional. These medicines may hide a fever. °Be careful brushing or flossing your teeth or using a toothpick because you may get an infection or bleed more easily. If you have any dental work done, tell your dentist you are receiving this medicine. °What side effects may I notice from receiving this medication? °Side effects that you should report to your doctor or health care professional as soon as possible: °allergic reactions like skin rash, itching or hives, swelling of the face, lips, or tongue °breathing problems °cough °low blood counts - this medicine may decrease the number of white blood cells, red blood cells, and platelets. You may be at increased risk for infections and bleeding °nausea, vomiting °pain, redness, or irritation at site where injected °pain, tingling, numbness in the hands or feet °signs and symptoms of bleeding such as bloody or black, tarry stools; red or dark brown urine; spitting up blood or brown material that looks like coffee grounds; red spots on the skin; unusual bruising or bleeding from the eyes, gums, or nose °signs and symptoms of a dangerous change in heartbeat or heart rhythm like chest pain; dizziness; fast, irregular heartbeat; palpitations; feeling faint or lightheaded; falls °signs and symptoms of infection like fever; chills; cough; sore throat; pain or trouble passing urine °signs and symptoms of liver injury like dark yellow or brown urine; general ill feeling or flu-like symptoms; light-colored stools; loss of appetite; nausea; right upper belly pain; unusually weak or tired; yellowing of the eyes or skin °signs and symptoms of low red blood cells or anemia such as unusually weak or tired; feeling faint  or lightheaded; falls °signs and symptoms of muscle injury like dark urine; trouble passing urine or change in the amount of urine; unusually weak or tired; muscle pain; back pain °Side effects that usually do not require medical attention (report to your doctor or health care professional if they continue or are bothersome): °changes in taste °diarrhea °gas °hair loss °loss of appetite °mouth sores °This list may not describe all possible side effects. Call your doctor for medical advice about side effects. You may report side effects to FDA at 1-800-FDA-1088. °Where should I keep my medication? °This drug is given in a hospital or clinic and will not be stored at home. °NOTE: This sheet is a summary. It may not cover all possible information. If you have questions about this medicine, talk to your doctor, pharmacist, or health care provider. °© 2022 [employer] Standard (2020-10-14 00:00:00) ° °Leucovorin injection °What is this medication? °LEUCOVORIN (loo koe VOR in) is used to prevent or treat the harmful effects of some medicines. This medicine is used to treat anemia caused by a low amount of folic acid in the body. It is also used with 5-fluorouracil (5-FU) to treat colon   cancer. This medicine may be used for other purposes; ask your health care provider or pharmacist if you have questions. What should I tell my care team before I take this medication? They need to know if you have any of these conditions: anemia from low levels of vitamin B-12 in the blood an unusual or allergic reaction to leucovorin, folic acid, other medicines, foods, dyes, or preservatives pregnant or trying to get pregnant breast-feeding How should I use this medication? This medicine is for injection into a muscle or into a vein. It is given by a health care professional in a hospital or clinic setting. Talk to your pediatrician regarding the use of this medicine in children. Special care may be needed. Overdosage: If you  think you have taken too much of this medicine contact a poison control center or emergency room at once. NOTE: This medicine is only for you. Do not share this medicine with others. What if I miss a dose? This does not apply. What may interact with this medication? capecitabine fluorouracil phenobarbital phenytoin primidone trimethoprim-sulfamethoxazole This list may not describe all possible interactions. Give your health care provider a list of all the medicines, herbs, non-prescription drugs, or dietary supplements you use. Also tell them if you smoke, drink alcohol, or use illegal drugs. Some items may interact with your medicine. What should I watch for while using this medication? Your condition will be monitored carefully while you are receiving this medicine. This medicine may increase the side effects of 5-fluorouracil, 5-FU. Tell your doctor or health care professional if you have diarrhea or mouth sores that do not get better or that get worse. What side effects may I notice from receiving this medication? Side effects that you should report to your doctor or health care professional as soon as possible: allergic reactions like skin rash, itching or hives, swelling of the face, lips, or tongue breathing problems fever, infection mouth sores unusual bleeding or bruising unusually weak or tired Side effects that usually do not require medical attention (report to your doctor or health care professional if they continue or are bothersome): constipation or diarrhea loss of appetite nausea, vomiting This list may not describe all possible side effects. Call your doctor for medical advice about side effects. You may report side effects to FDA at 1-800-FDA-1088. Where should I keep my medication? This drug is given in a hospital or clinic and will not be stored at home. NOTE: This sheet is a summary. It may not cover all possible information. If you have questions about this  medicine, talk to your doctor, pharmacist, or health care provider.  2022 [employer]/Gold Standard (2007-08-03 00:00:00)  Fluorouracil, 5-FU injection What is this medication? FLUOROURACIL, 5-FU (flure oh YOOR a sil) is a chemotherapy drug. It slows the growth of cancer cells. This medicine is used to treat many types of cancer like breast cancer, colon or rectal cancer, pancreatic cancer, and stomach cancer. This medicine may be used for other purposes; ask your health care provider or pharmacist if you have questions. COMMON BRAND NAME(S): Adrucil What should I tell my care team before I take this medication? They need to know if you have any of these conditions: blood disorders dihydropyrimidine dehydrogenase (DPD) deficiency infection (especially a virus infection such as chickenpox, cold sores, or herpes) kidney disease liver disease malnourished, poor nutrition recent or ongoing radiation therapy an unusual or allergic reaction to fluorouracil, other chemotherapy, other medicines, foods, dyes, or preservatives pregnant or trying to get pregnant  breast-feeding How should I use this medication? This drug is given as an infusion or injection into a vein. It is administered in a hospital or clinic by a specially trained health care professional. Talk to your pediatrician regarding the use of this medicine in children. Special care may be needed. Overdosage: If you think you have taken too much of this medicine contact a poison control center or emergency room at once. NOTE: This medicine is only for you. Do not share this medicine with others. What if I miss a dose? It is important not to miss your dose. Call your doctor or health care professional if you are unable to keep an appointment. What may interact with this medication? Do not take this medicine with any of the following medications: live virus vaccines This medicine may also interact with the following medications: medicines  that treat or prevent blood clots like warfarin, enoxaparin, and dalteparin This list may not describe all possible interactions. Give your health care provider a list of all the medicines, herbs, non-prescription drugs, or dietary supplements you use. Also tell them if you smoke, drink alcohol, or use illegal drugs. Some items may interact with your medicine. What should I watch for while using this medication? Visit your doctor for checks on your progress. This drug may make you feel generally unwell. This is not uncommon, as chemotherapy can affect healthy cells as well as cancer cells. Report any side effects. Continue your course of treatment even though you feel ill unless your doctor tells you to stop. In some cases, you may be given additional medicines to help with side effects. Follow all directions for their use. Call your doctor or health care professional for advice if you get a fever, chills or sore throat, or other symptoms of a cold or flu. Do not treat yourself. This drug decreases your body's ability to fight infections. Try to avoid being around people who are sick. This medicine may increase your risk to bruise or bleed. Call your doctor or health care professional if you notice any unusual bleeding. Be careful brushing and flossing your teeth or using a toothpick because you may get an infection or bleed more easily. If you have any dental work done, tell your dentist you are receiving this medicine. Avoid taking products that contain aspirin, acetaminophen, ibuprofen, naproxen, or ketoprofen unless instructed by your doctor. These medicines may hide a fever. Do not become pregnant while taking this medicine. Women should inform their doctor if they wish to become pregnant or think they might be pregnant. There is a potential for serious side effects to an unborn child. Talk to your health care professional or pharmacist for more information. Do not breast-feed an infant while taking  this medicine. Men should inform their doctor if they wish to father a child. This medicine may lower sperm counts. Do not treat diarrhea with over the counter products. Contact your doctor if you have diarrhea that lasts more than 2 days or if it is severe and watery. This medicine can make you more sensitive to the sun. Keep out of the sun. If you cannot avoid being in the sun, wear protective clothing and use sunscreen. Do not use sun lamps or tanning beds/booths. What side effects may I notice from receiving this medication? Side effects that you should report to your doctor or health care professional as soon as possible: allergic reactions like skin rash, itching or hives, swelling of the face, lips, or tongue low blood  counts - this medicine may decrease the number of white blood cells, red blood cells and platelets. You may be at increased risk for infections and bleeding. signs of infection - fever or chills, cough, sore throat, pain or difficulty passing urine signs of decreased platelets or bleeding - bruising, pinpoint red spots on the skin, black, tarry stools, blood in the urine signs of decreased red blood cells - unusually weak or tired, fainting spells, lightheadedness breathing problems changes in vision chest pain mouth sores nausea and vomiting pain, swelling, redness at site where injected pain, tingling, numbness in the hands or feet redness, swelling, or sores on hands or feet stomach pain unusual bleeding Side effects that usually do not require medical attention (report to your doctor or health care professional if they continue or are bothersome): changes in finger or toe nails diarrhea dry or itchy skin hair loss headache loss of appetite sensitivity of eyes to the light stomach upset unusually teary eyes This list may not describe all possible side effects. Call your doctor for medical advice about side effects. You may report side effects to FDA at  1-800-FDA-1088. Where should I keep my medication? This drug is given in a hospital or clinic and will not be stored at home. NOTE: This sheet is a summary. It may not cover all possible information. If you have questions about this medicine, talk to your doctor, pharmacist, or health care provider.  2022 [employer] Standard (2020-10-14 00:00:00)  The chemotherapy medication bag should finish at 46 hours, 96 hours, or 7 days. For example, if your pump is scheduled for 46 hours and it was put on at 4:00 p.m., it should finish at 2:00 p.m. the day it is scheduled to come off regardless of your appointment time.     Estimated time to finish at 11:00 a.m. on Thursday 02/19/2021.   If the display on your pump reads "Low Volume" and it is beeping, take the batteries out of the pump and come to the cancer center for it to be taken off.   If the pump alarms go off prior to the pump reading "Low Volume" then call [phone] and someone can assist you.  If the plunger comes out and the chemotherapy medication is leaking out, please use your home chemo spill kit to clean up the spill. Do NOT use paper towels or other household products.  If you have problems or questions regarding your pump, please call either [phone] (24 hours a day) or the cancer center Monday-Friday 8:00 a.m.- 4:30 p.m. at the clinic number and we will assist you. If you are unable to get assistance, then go to the nearest Emergency Department and ask the staff to contact the IV team for assistance.

## 2021-02-19 ENCOUNTER — Inpatient Hospital Stay: Payer: Medicare Other

## 2021-02-19 ENCOUNTER — Other Ambulatory Visit: Payer: Self-pay

## 2021-02-19 VITALS — BP 125/62 | HR 71 | Temp 98.2°F | Resp 18

## 2021-02-19 DIAGNOSIS — C187 Malignant neoplasm of sigmoid colon: Secondary | ICD-10-CM

## 2021-02-19 DIAGNOSIS — C7802 Secondary malignant neoplasm of left lung: Secondary | ICD-10-CM | POA: Diagnosis not present

## 2021-02-19 DIAGNOSIS — Z5111 Encounter for antineoplastic chemotherapy: Secondary | ICD-10-CM | POA: Diagnosis not present

## 2021-02-19 DIAGNOSIS — C7801 Secondary malignant neoplasm of right lung: Secondary | ICD-10-CM | POA: Diagnosis not present

## 2021-02-19 DIAGNOSIS — Z452 Encounter for adjustment and management of vascular access device: Secondary | ICD-10-CM | POA: Diagnosis not present

## 2021-02-19 MED ORDER — SODIUM CHLORIDE 0.9% FLUSH
10.0000 mL | INTRAVENOUS | Status: DC | PRN
  Administered 2021-02-19: 10 mL

## 2021-02-19 MED ORDER — HEPARIN SOD (PORK) LOCK FLUSH 100 UNIT/ML IV SOLN
500.0000 [IU] | Freq: Once | INTRAVENOUS | Status: AC | PRN
  Administered 2021-02-19: 500 [IU]

## 2021-02-19 NOTE — Patient Instructions (Signed)

## 2021-03-01 ENCOUNTER — Other Ambulatory Visit: Payer: Self-pay | Admitting: Oncology

## 2021-03-03 ENCOUNTER — Inpatient Hospital Stay: Payer: Medicare Other

## 2021-03-03 ENCOUNTER — Other Ambulatory Visit: Payer: Self-pay

## 2021-03-03 ENCOUNTER — Encounter: Payer: Self-pay | Admitting: *Deleted

## 2021-03-03 ENCOUNTER — Inpatient Hospital Stay: Payer: Medicare Other | Admitting: Oncology

## 2021-03-03 VITALS — BP 155/79 | HR 60 | Temp 98.2°F | Resp 18

## 2021-03-03 VITALS — BP 151/77 | HR 67 | Temp 98.1°F | Resp 18 | Ht 68.0 in | Wt 195.2 lb

## 2021-03-03 DIAGNOSIS — Z5111 Encounter for antineoplastic chemotherapy: Secondary | ICD-10-CM | POA: Diagnosis not present

## 2021-03-03 DIAGNOSIS — C187 Malignant neoplasm of sigmoid colon: Secondary | ICD-10-CM | POA: Diagnosis not present

## 2021-03-03 DIAGNOSIS — C7801 Secondary malignant neoplasm of right lung: Secondary | ICD-10-CM | POA: Diagnosis not present

## 2021-03-03 DIAGNOSIS — Z452 Encounter for adjustment and management of vascular access device: Secondary | ICD-10-CM | POA: Diagnosis not present

## 2021-03-03 DIAGNOSIS — C7802 Secondary malignant neoplasm of left lung: Secondary | ICD-10-CM | POA: Diagnosis not present

## 2021-03-03 LAB — CMP (CANCER CENTER ONLY)
ALT: 21 U/L (ref 0–44)
AST: 28 U/L (ref 15–41)
Albumin: 3.8 g/dL (ref 3.5–5.0)
Alkaline Phosphatase: 78 U/L (ref 38–126)
Anion gap: 8 (ref 5–15)
BUN: 13 mg/dL (ref 8–23)
CO2: 25 mmol/L (ref 22–32)
Calcium: 9.2 mg/dL (ref 8.9–10.3)
Chloride: 105 mmol/L (ref 98–111)
Creatinine: 0.79 mg/dL (ref 0.61–1.24)
GFR, Estimated: >60 mL/min (ref >60)
Glucose, Bld: 103 mg/dL — ABNORMAL HIGH (ref 70–99)
Potassium: 3.7 mmol/L (ref 3.5–5.1)
Sodium: 138 mmol/L (ref 135–145)
Total Bilirubin: 0.5 mg/dL (ref 0.3–1.2)
Total Protein: 7.3 g/dL (ref 6.5–8.1)

## 2021-03-03 LAB — CBC WITH DIFFERENTIAL (CANCER CENTER ONLY)
Abs Immature Granulocytes: 0.02 10*3/uL (ref 0.00–0.07)
Basophils Absolute: 0.1 10*3/uL (ref 0.0–0.1)
Basophils Relative: 2 %
Eosinophils Absolute: 0.2 10*3/uL (ref 0.0–0.5)
Eosinophils Relative: 4 %
HCT: 41.8 % (ref 39.0–52.0)
Hemoglobin: 14.1 g/dL (ref 13.0–17.0)
Immature Granulocytes: 0 %
Lymphocytes Relative: 27 %
Lymphs Abs: 1.4 10*3/uL (ref 0.7–4.0)
MCH: 31.4 pg (ref 26.0–34.0)
MCHC: 33.7 g/dL (ref 30.0–36.0)
MCV: 93.1 fL (ref 80.0–100.0)
Monocytes Absolute: 0.8 10*3/uL (ref 0.1–1.0)
Monocytes Relative: 14 %
Neutro Abs: 2.7 10*3/uL (ref 1.7–7.7)
Neutrophils Relative %: 53 %
Platelet Count: 210 10*3/uL (ref 150–400)
RBC: 4.49 MIL/uL (ref 4.22–5.81)
RDW: 14.9 % (ref 11.5–15.5)
WBC Count: 5.3 10*3/uL (ref 4.0–10.5)
nRBC: 0 % (ref 0.0–0.2)

## 2021-03-03 LAB — CEA (ACCESS): CEA (CHCC): 6.53 ng/mL — ABNORMAL HIGH (ref 0.00–5.00)

## 2021-03-03 MED ORDER — SODIUM CHLORIDE 0.9 % IV SOLN
10.0000 mg | Freq: Once | INTRAVENOUS | Status: AC
  Administered 2021-03-03: 11:00:00 10 mg via INTRAVENOUS
  Filled 2021-03-03: qty 10

## 2021-03-03 MED ORDER — DEXTROSE 5 % IV SOLN: Freq: Once | INTRAVENOUS | Status: AC

## 2021-03-03 MED ORDER — PALONOSETRON HCL INJECTION 0.25 MG/5ML
0.2500 mg | Freq: Once | INTRAVENOUS | Status: AC
  Administered 2021-03-03: 11:00:00 0.25 mg via INTRAVENOUS
  Filled 2021-03-03: qty 5

## 2021-03-03 MED ORDER — LEUCOVORIN CALCIUM INJECTION 350 MG
300.0000 mg/m2 | Freq: Once | INTRAVENOUS | Status: AC
  Administered 2021-03-03: 12:00:00 600 mg via INTRAVENOUS
  Filled 2021-03-03: qty 25

## 2021-03-03 MED ORDER — SODIUM CHLORIDE 0.9 % IV SOLN
1800.0000 mg/m2 | INTRAVENOUS | Status: DC
  Administered 2021-03-03: 14:00:00 3600 mg via INTRAVENOUS
  Filled 2021-03-03: qty 72

## 2021-03-03 MED ORDER — OXALIPLATIN CHEMO INJECTION 100 MG/20ML
85.0000 mg/m2 | Freq: Once | INTRAVENOUS | Status: AC
  Administered 2021-03-03: 12:00:00 170 mg via INTRAVENOUS
  Filled 2021-03-03: qty 34

## 2021-03-03 NOTE — Progress Notes (Unsigned)
Patient seen by Dr. Benay Spice today  Vitals are within treatment parameters.  Labs reviewed by Dr. Benay Spice and are within treatment parameters.  Per physician team, patient is ready for treatment and there are NO modifications to the treatment plan.  OK to proceed despite CEA increase. Will have PET scan prior to next cycle.

## 2021-03-03 NOTE — Patient Instructions (Signed)
[facility]   Discharge Instructions: Thank you for choosing [facility] to provide your oncology and hematology care.   If you have a lab appointment with the [facility], please go directly to the [facility] and check in at the registration area.   Wear comfortable clothing and clothing appropriate for easy access to any Portacath or PICC line.   We strive to give you quality time with your provider. You may need to reschedule your appointment if you arrive late (15 or more minutes).  Arriving late affects you and other patients whose appointments are after yours.  Also, if you miss three or more appointments without notifying the office, you may be dismissed from the clinic at the providers discretion.      For prescription refill requests, have your pharmacy contact our office and allow 72 hours for refills to be completed.    Today you received the following chemotherapy and/or immunotherapy agents Oxaliplatin (ELOXATIN), Leucovorin & Flourouracil (ADRUCIL).      To help prevent nausea and vomiting after your treatment, we encourage you to take your nausea medication as directed.  BELOW ARE SYMPTOMS THAT SHOULD BE REPORTED IMMEDIATELY: *FEVER GREATER THAN 100.4 F (38 C) OR HIGHER *CHILLS OR SWEATING *NAUSEA AND VOMITING THAT IS NOT CONTROLLED WITH YOUR NAUSEA MEDICATION *UNUSUAL SHORTNESS OF BREATH *UNUSUAL BRUISING OR BLEEDING *URINARY PROBLEMS (pain or burning when urinating, or frequent urination) *BOWEL PROBLEMS (unusual diarrhea, constipation, pain near the anus) TENDERNESS IN MOUTH AND THROAT WITH OR WITHOUT PRESENCE OF ULCERS (sore throat, sores in mouth, or a toothache) UNUSUAL RASH, SWELLING OR PAIN  UNUSUAL VAGINAL DISCHARGE OR ITCHING   Items with * indicate a potential emergency and should be followed up as soon as possible or go to the Emergency Department if any problems should occur.  Please show the CHEMOTHERAPY ALERT  CARD or IMMUNOTHERAPY ALERT CARD at check-in to the Emergency Department and triage nurse.  Should you have questions after your visit or need to cancel or reschedule your appointment, please contact [facility]  Dept: [phone]  and follow the prompts.  Office hours are 8:00 a.m. to 4:30 p.m. Monday - Friday. Please note that voicemails left after 4:00 p.m. may not be returned until the following business day.  We are closed weekends and major holidays. You have access to a nurse at all times for urgent questions. Please call the main number to the clinic Dept: [phone] and follow the prompts.   For any non-urgent questions, you may also contact your provider using MyChart. We now offer e-Visits for anyone 18 and older to request care online for non-urgent symptoms. For details visit [email or web address].   Also download the MyChart app! Go to the app store, search "MyChart", open the app, select [name], and log in with your MyChart username and password.  Due to Covid, a mask is required upon entering the hospital/clinic. If you do not have a mask, one will be given to you upon arrival. For doctor visits, patients may have 1 support person aged 38 or older with them. For treatment visits, patients cannot have anyone with them due to current Covid guidelines and our immunocompromised population.   Oxaliplatin Injection What is this medication? OXALIPLATIN (ox AL i PLA tin) is a chemotherapy drug. It targets fast dividing cells, like cancer cells, and causes these cells to die. This medicine is used to treat cancers of the colon and rectum, and  many other cancers. This medicine may be used for other purposes; ask your health care provider or pharmacist if you have questions. COMMON BRAND NAME(S): Eloxatin What should I tell my care team before I take this medication? They need to know if you have any of these conditions: heart disease history of irregular  heartbeat liver disease low blood counts, like white cells, platelets, or red blood cells lung or breathing disease, like asthma take medicines that treat or prevent blood clots tingling of the fingers or toes, or other nerve disorder an unusual or allergic reaction to oxaliplatin, other chemotherapy, other medicines, foods, dyes, or preservatives pregnant or trying to get pregnant breast-feeding How should I use this medication? This drug is given as an infusion into a vein. It is administered in a hospital or clinic by a specially trained health care professional. Talk to your pediatrician regarding the use of this medicine in children. Special care may be needed. Overdosage: If you think you have taken too much of this medicine contact a poison control center or emergency room at once. NOTE: This medicine is only for you. Do not share this medicine with others. What if I miss a dose? It is important not to miss a dose. Call your doctor or health care professional if you are unable to keep an appointment. What may interact with this medication? Do not take this medicine with any of the following medications: cisapride dronedarone pimozide thioridazine This medicine may also interact with the following medications: aspirin and aspirin-like medicines certain medicines that treat or prevent blood clots like warfarin, apixaban, dabigatran, and rivaroxaban cisplatin cyclosporine diuretics medicines for infection like acyclovir, adefovir, amphotericin B, bacitracin, cidofovir, foscarnet, ganciclovir, gentamicin, pentamidine, vancomycin NSAIDs, medicines for pain and inflammation, like ibuprofen or naproxen other medicines that prolong the QT interval (an abnormal heart rhythm) pamidronate zoledronic acid This list may not describe all possible interactions. Give your health care provider a list of all the medicines, herbs, non-prescription drugs, or dietary supplements you use. Also tell  them if you smoke, drink alcohol, or use illegal drugs. Some items may interact with your medicine. What should I watch for while using this medication? Your condition will be monitored carefully while you are receiving this medicine. You may need blood work done while you are taking this medicine. This medicine may make you feel generally unwell. This is not uncommon as chemotherapy can affect healthy cells as well as cancer cells. Report any side effects. Continue your course of treatment even though you feel ill unless your healthcare professional tells you to stop. This medicine can make you more sensitive to cold. Do not drink cold drinks or use ice. Cover exposed skin before coming in contact with cold temperatures or cold objects. When out in cold weather wear warm clothing and cover your mouth and nose to warm the air that goes into your lungs. Tell your doctor if you get sensitive to the cold. Do not become pregnant while taking this medicine or for 9 months after stopping it. Women should inform their health care professional if they wish to become pregnant or think they might be pregnant. Men should not father a child while taking this medicine and for 6 months after stopping it. There is potential for serious side effects to an unborn child. Talk to your health care professional for more information. Do not breast-feed a child while taking this medicine or for 3 months after stopping it. This medicine has caused ovarian failure in  some women. This medicine may make it more difficult to get pregnant. Talk to your health care professional if you are concerned about your fertility. °This medicine has caused decreased sperm counts in some men. This may make it more difficult to father a child. Talk to your health care professional if you are concerned about your fertility. °This medicine may increase your risk of getting an infection. Call your health care professional for advice if you get a fever,  chills, or sore throat, or other symptoms of a cold or flu. Do not treat yourself. Try to avoid being around people who are sick. °Avoid taking medicines that contain aspirin, acetaminophen, ibuprofen, naproxen, or ketoprofen unless instructed by your health care professional. These medicines may hide a fever. °Be careful brushing or flossing your teeth or using a toothpick because you may get an infection or bleed more easily. If you have any dental work done, tell your dentist you are receiving this medicine. °What side effects may I notice from receiving this medication? °Side effects that you should report to your doctor or health care professional as soon as possible: °allergic reactions like skin rash, itching or hives, swelling of the face, lips, or tongue °breathing problems °cough °low blood counts - this medicine may decrease the number of white blood cells, red blood cells, and platelets. You may be at increased risk for infections and bleeding °nausea, vomiting °pain, redness, or irritation at site where injected °pain, tingling, numbness in the hands or feet °signs and symptoms of bleeding such as bloody or black, tarry stools; red or dark brown urine; spitting up blood or brown material that looks like coffee grounds; red spots on the skin; unusual bruising or bleeding from the eyes, gums, or nose °signs and symptoms of a dangerous change in heartbeat or heart rhythm like chest pain; dizziness; fast, irregular heartbeat; palpitations; feeling faint or lightheaded; falls °signs and symptoms of infection like fever; chills; cough; sore throat; pain or trouble passing urine °signs and symptoms of liver injury like dark yellow or brown urine; general ill feeling or flu-like symptoms; light-colored stools; loss of appetite; nausea; right upper belly pain; unusually weak or tired; yellowing of the eyes or skin °signs and symptoms of low red blood cells or anemia such as unusually weak or tired; feeling faint  or lightheaded; falls °signs and symptoms of muscle injury like dark urine; trouble passing urine or change in the amount of urine; unusually weak or tired; muscle pain; back pain °Side effects that usually do not require medical attention (report to your doctor or health care professional if they continue or are bothersome): °changes in taste °diarrhea °gas °hair loss °loss of appetite °mouth sores °This list may not describe all possible side effects. Call your doctor for medical advice about side effects. You may report side effects to FDA at 1-800-FDA-1088. °Where should I keep my medication? °This drug is given in a hospital or clinic and will not be stored at home. °NOTE: This sheet is a summary. It may not cover all possible information. If you have questions about this medicine, talk to your doctor, pharmacist, or health care provider. °© 2022 [employer] Standard (2020-10-14 00:00:00) ° °Leucovorin injection °What is this medication? °LEUCOVORIN (loo koe VOR in) is used to prevent or treat the harmful effects of some medicines. This medicine is used to treat anemia caused by a low amount of folic acid in the body. It is also used with 5-fluorouracil (5-FU) to treat colon   cancer. This medicine may be used for other purposes; ask your health care provider or pharmacist if you have questions. What should I tell my care team before I take this medication? They need to know if you have any of these conditions: anemia from low levels of vitamin B-12 in the blood an unusual or allergic reaction to leucovorin, folic acid, other medicines, foods, dyes, or preservatives pregnant or trying to get pregnant breast-feeding How should I use this medication? This medicine is for injection into a muscle or into a vein. It is given by a health care professional in a hospital or clinic setting. Talk to your pediatrician regarding the use of this medicine in children. Special care may be needed. Overdosage: If you  think you have taken too much of this medicine contact a poison control center or emergency room at once. NOTE: This medicine is only for you. Do not share this medicine with others. What if I miss a dose? This does not apply. What may interact with this medication? capecitabine fluorouracil phenobarbital phenytoin primidone trimethoprim-sulfamethoxazole This list may not describe all possible interactions. Give your health care provider a list of all the medicines, herbs, non-prescription drugs, or dietary supplements you use. Also tell them if you smoke, drink alcohol, or use illegal drugs. Some items may interact with your medicine. What should I watch for while using this medication? Your condition will be monitored carefully while you are receiving this medicine. This medicine may increase the side effects of 5-fluorouracil, 5-FU. Tell your doctor or health care professional if you have diarrhea or mouth sores that do not get better or that get worse. What side effects may I notice from receiving this medication? Side effects that you should report to your doctor or health care professional as soon as possible: allergic reactions like skin rash, itching or hives, swelling of the face, lips, or tongue breathing problems fever, infection mouth sores unusual bleeding or bruising unusually weak or tired Side effects that usually do not require medical attention (report to your doctor or health care professional if they continue or are bothersome): constipation or diarrhea loss of appetite nausea, vomiting This list may not describe all possible side effects. Call your doctor for medical advice about side effects. You may report side effects to FDA at 1-800-FDA-1088. Where should I keep my medication? This drug is given in a hospital or clinic and will not be stored at home. NOTE: This sheet is a summary. It may not cover all possible information. If you have questions about this  medicine, talk to your doctor, pharmacist, or health care provider.  2022 Elsevier/Gold Standard (2007-08-03 00:00:00)  Fluorouracil, 5-FU injection What is this medication? FLUOROURACIL, 5-FU (flure oh YOOR a sil) is a chemotherapy drug. It slows the growth of cancer cells. This medicine is used to treat many types of cancer like breast cancer, colon or rectal cancer, pancreatic cancer, and stomach cancer. This medicine may be used for other purposes; ask your health care provider or pharmacist if you have questions. COMMON BRAND NAME(S): Adrucil What should I tell my care team before I take this medication? They need to know if you have any of these conditions: blood disorders dihydropyrimidine dehydrogenase (DPD) deficiency infection (especially a virus infection such as chickenpox, cold sores, or herpes) kidney disease liver disease malnourished, poor nutrition recent or ongoing radiation therapy an unusual or allergic reaction to fluorouracil, other chemotherapy, other medicines, foods, dyes, or preservatives pregnant or trying to get pregnant  breast-feeding How should I use this medication? This drug is given as an infusion or injection into a vein. It is administered in a hospital or clinic by a specially trained health care professional. Talk to your pediatrician regarding the use of this medicine in children. Special care may be needed. Overdosage: If you think you have taken too much of this medicine contact a poison control center or emergency room at once. NOTE: This medicine is only for you. Do not share this medicine with others. What if I miss a dose? It is important not to miss your dose. Call your doctor or health care professional if you are unable to keep an appointment. What may interact with this medication? Do not take this medicine with any of the following medications: live virus vaccines This medicine may also interact with the following medications: medicines  that treat or prevent blood clots like warfarin, enoxaparin, and dalteparin This list may not describe all possible interactions. Give your health care provider a list of all the medicines, herbs, non-prescription drugs, or dietary supplements you use. Also tell them if you smoke, drink alcohol, or use illegal drugs. Some items may interact with your medicine. What should I watch for while using this medication? Visit your doctor for checks on your progress. This drug may make you feel generally unwell. This is not uncommon, as chemotherapy can affect healthy cells as well as cancer cells. Report any side effects. Continue your course of treatment even though you feel ill unless your doctor tells you to stop. In some cases, you may be given additional medicines to help with side effects. Follow all directions for their use. Call your doctor or health care professional for advice if you get a fever, chills or sore throat, or other symptoms of a cold or flu. Do not treat yourself. This drug decreases your body's ability to fight infections. Try to avoid being around people who are sick. This medicine may increase your risk to bruise or bleed. Call your doctor or health care professional if you notice any unusual bleeding. Be careful brushing and flossing your teeth or using a toothpick because you may get an infection or bleed more easily. If you have any dental work done, tell your dentist you are receiving this medicine. Avoid taking products that contain aspirin, acetaminophen, ibuprofen, naproxen, or ketoprofen unless instructed by your doctor. These medicines may hide a fever. Do not become pregnant while taking this medicine. Women should inform their doctor if they wish to become pregnant or think they might be pregnant. There is a potential for serious side effects to an unborn child. Talk to your health care professional or pharmacist for more information. Do not breast-feed an infant while taking  this medicine. Men should inform their doctor if they wish to father a child. This medicine may lower sperm counts. Do not treat diarrhea with over the counter products. Contact your doctor if you have diarrhea that lasts more than 2 days or if it is severe and watery. This medicine can make you more sensitive to the sun. Keep out of the sun. If you cannot avoid being in the sun, wear protective clothing and use sunscreen. Do not use sun lamps or tanning beds/booths. What side effects may I notice from receiving this medication? Side effects that you should report to your doctor or health care professional as soon as possible: allergic reactions like skin rash, itching or hives, swelling of the face, lips, or tongue low blood  counts - this medicine may decrease the number of white blood cells, red blood cells and platelets. You may be at increased risk for infections and bleeding. signs of infection - fever or chills, cough, sore throat, pain or difficulty passing urine signs of decreased platelets or bleeding - bruising, pinpoint red spots on the skin, black, tarry stools, blood in the urine signs of decreased red blood cells - unusually weak or tired, fainting spells, lightheadedness breathing problems changes in vision chest pain mouth sores nausea and vomiting pain, swelling, redness at site where injected pain, tingling, numbness in the hands or feet redness, swelling, or sores on hands or feet stomach pain unusual bleeding Side effects that usually do not require medical attention (report to your doctor or health care professional if they continue or are bothersome): changes in finger or toe nails diarrhea dry or itchy skin hair loss headache loss of appetite sensitivity of eyes to the light stomach upset unusually teary eyes This list may not describe all possible side effects. Call your doctor for medical advice about side effects. You may report side effects to FDA at  1-800-FDA-1088. Where should I keep my medication? This drug is given in a hospital or clinic and will not be stored at home. NOTE: This sheet is a summary. It may not cover all possible information. If you have questions about this medicine, talk to your doctor, pharmacist, or health care provider.  2022 [employer]/Gold Standard (2020-10-14 00:00:00)  The chemotherapy medication bag should finish at 46 hours, 96 hours, or 7 days. For example, if your pump is scheduled for 46 hours and it was put on at 4:00 p.m., it should finish at 2:00 p.m. the day it is scheduled to come off regardless of your appointment time.     Estimated time to finish at 12:30 p.m. on Thursday 03/05/2021.   If the display on your pump reads "Low Volume" and it is beeping, take the batteries out of the pump and come to the cancer center for it to be taken off.   If the pump alarms go off prior to the pump reading "Low Volume" then call [phone] and someone can assist you.  If the plunger comes out and the chemotherapy medication is leaking out, please use your home chemo spill kit to clean up the spill. Do NOT use paper towels or other household products.  If you have problems or questions regarding your pump, please call either [phone] (24 hours a day) or the cancer center Monday-Friday 8:00 a.m.- 4:30 p.m. at the clinic number and we will assist you. If you are unable to get assistance, then go to the nearest Emergency Department and ask the staff to contact the IV team for assistance.

## 2021-03-03 NOTE — Progress Notes (Signed)
Patient presents for treatment. RN assessment completed along with the following:  Labs/vitals reviewed - Yes, and within treatment parameters.   Weight within 10% of previous measurement - Yes Informed consent completed and reflects current therapy/intent - Yes, on date 10/28/20             Provider progress note reviewed - Yes, today's provider note was reviewed. Treatment/Antibody/Supportive plan reviewed - Yes, and there are no adjustments needed for today's treatment. S&H and other orders reviewed - Yes, and there are no additional orders identified. Previous treatment date reviewed - Yes, and the appropriate amount of time has elapsed between treatments. Clinic Hand Off Received from - Merceda Elks, RN  Patient to proceed with treatment.

## 2021-03-03 NOTE — Progress Notes (Signed)
[facility] OFFICE PROGRESS NOTE   Diagnosis: Colon cancer  INTERVAL HISTORY:   Craig Best completed another cycle of FOLFOX on 02/17/2021.  No nausea/vomiting, mouth sores, or diarrhea.  He has cold sensitivity.  No other neuropathy symptoms.  He is working.  No new complaint.  Objective:  Vital signs in last 24 hours:  Blood pressure (!) 151/77, pulse 67, temperature 98.1 F (36.7 C), temperature source Oral, resp. rate 18, height $RemoveBe'5\' 8"'SXpYsuwYK$  (1.727 m), weight 195 lb 3.2 oz (88.5 kg), SpO2 100 %.    HEENT: No thrush or ulcers Resp: Lungs clear bilaterally Cardio: Regular rate and rhythm GI: No hepatosplenomegaly Vascular: No leg edema, the right lower leg is larger than the left side, nontender, no erythema Neuro: Mild loss of vibratory sense at the fingertips bilaterally    Portacath/PICC-without erythema  Lab Results:  Lab Results  Component Value Date   WBC 5.3 03/03/2021   HGB 14.1 03/03/2021   HCT 41.8 03/03/2021   MCV 93.1 03/03/2021   PLT 210 03/03/2021   NEUTROABS 2.7 03/03/2021    CMP  Lab Results  Component Value Date   NA 138 03/03/2021   K 3.7 03/03/2021   CL 105 03/03/2021   CO2 25 03/03/2021   GLUCOSE 103 (H) 03/03/2021   BUN 13 03/03/2021   CREATININE 0.79 03/03/2021   CALCIUM 9.2 03/03/2021   PROT 7.3 03/03/2021   ALBUMIN 3.8 03/03/2021   AST 28 03/03/2021   ALT 21 03/03/2021   ALKPHOS 78 03/03/2021   BILITOT 0.5 03/03/2021   GFRNONAA >60 03/03/2021    Lab Results  Component Value Date   CEA1 63.2 (H) 09/19/2020   CEA 6.53 (H) 03/03/2021     Medications: I have reviewed the patient's current medications.   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), status post a sigmoid colectomy and partial bladder resection 09/19/2020 2 separate sigmoid colon tumors, 2.5 cm apart, 1/13 lymph nodes positive, tumor extends to adherent soft tissue of the bladder, MSS, normal mismatch repair protein expression tumor  mutation burden 0, K-ras G12D CT abdomen/pelvis 09/19/2020-sigmoid colon mass with colonic obstruction, 6 mm right lower lobe nodule CT chest 09/22/2020-multiple bilateral lung nodules consistent with metastases Elevated preoperative CEA Cycle 1 FOLFOX 10/28/2020 Cycle 2 FOLFOX 11/11/2020 Cycle 3 FOLFOX 11/25/2020 Cycle 4 FOLFOX 12/09/2020 Cycle 5 FOLFOX 12/23/2020 CT chest 12/25/2020-unchanged lung nodules, 2 cm subpleural nodule in the left posterior chest more prominent-loculated fluid? Cycle 6 FOLFOX 02/03/2021-5-FU dose reduced and bolus eliminated secondary to diarrhea Cycle 7 FOLFOX 02/17/2021 Cycle 8 FOLFOX 03/03/2021   2.  Port-A-Cath placement-Dr. 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  Has completed 7 cycles of FOLFOX.  We will complete cycle 8 chemotherapy today.  He will undergo a staging PET scan prior to an office visit in 2 weeks.  The PET scan is to further characterize the lung nodules and look for other sites of metastatic disease.  The CEA is slightly higher today.    Betsy Coder, MD  03/03/2021  10:56 AM

## 2021-03-05 ENCOUNTER — Other Ambulatory Visit: Payer: Self-pay

## 2021-03-05 ENCOUNTER — Inpatient Hospital Stay: Payer: Medicare Other

## 2021-03-05 VITALS — BP 145/61 | HR 69 | Temp 98.5°F | Resp 20

## 2021-03-05 DIAGNOSIS — Z452 Encounter for adjustment and management of vascular access device: Secondary | ICD-10-CM | POA: Diagnosis not present

## 2021-03-05 DIAGNOSIS — Z5111 Encounter for antineoplastic chemotherapy: Secondary | ICD-10-CM | POA: Diagnosis not present

## 2021-03-05 DIAGNOSIS — C7802 Secondary malignant neoplasm of left lung: Secondary | ICD-10-CM | POA: Diagnosis not present

## 2021-03-05 DIAGNOSIS — C7801 Secondary malignant neoplasm of right lung: Secondary | ICD-10-CM | POA: Diagnosis not present

## 2021-03-05 DIAGNOSIS — C187 Malignant neoplasm of sigmoid colon: Secondary | ICD-10-CM | POA: Diagnosis not present

## 2021-03-05 MED ORDER — SODIUM CHLORIDE 0.9% FLUSH
10.0000 mL | INTRAVENOUS | Status: DC | PRN
  Administered 2021-03-05: 10 mL

## 2021-03-05 MED ORDER — HEPARIN SOD (PORK) LOCK FLUSH 100 UNIT/ML IV SOLN
500.0000 [IU] | Freq: Once | INTRAVENOUS | Status: AC | PRN
  Administered 2021-03-05: 500 [IU]

## 2021-03-14 ENCOUNTER — Other Ambulatory Visit: Payer: Self-pay | Admitting: Oncology

## 2021-03-17 ENCOUNTER — Other Ambulatory Visit: Payer: Self-pay

## 2021-03-17 ENCOUNTER — Ambulatory Visit (HOSPITAL_COMMUNITY)
Admission: RE | Admit: 2021-03-17 | Discharge: 2021-03-17 | Disposition: A | Discharge status: Home / Self Care | Payer: Medicare Other | Source: Ambulatory Visit | Attending: Oncology | Admitting: Oncology

## 2021-03-17 DIAGNOSIS — C187 Malignant neoplasm of sigmoid colon: Secondary | ICD-10-CM | POA: Insufficient documentation

## 2021-03-17 DIAGNOSIS — D3500 Benign neoplasm of unspecified adrenal gland: Secondary | ICD-10-CM | POA: Diagnosis not present

## 2021-03-17 DIAGNOSIS — C189 Malignant neoplasm of colon, unspecified: Secondary | ICD-10-CM | POA: Diagnosis not present

## 2021-03-17 DIAGNOSIS — K8020 Calculus of gallbladder without cholecystitis without obstruction: Secondary | ICD-10-CM | POA: Diagnosis not present

## 2021-03-17 DIAGNOSIS — I2510 Atherosclerotic heart disease of native coronary artery without angina pectoris: Secondary | ICD-10-CM | POA: Diagnosis not present

## 2021-03-17 LAB — GLUCOSE, CAPILLARY: Glucose-Capillary: 116 mg/dL — ABNORMAL HIGH (ref 70–99)

## 2021-03-17 MED ORDER — FLUDEOXYGLUCOSE F - 18 (FDG) INJECTION
9.5000 | Freq: Once | INTRAVENOUS | Status: AC | PRN
  Administered 2021-03-17: 9.61 via INTRAVENOUS

## 2021-03-19 ENCOUNTER — Inpatient Hospital Stay: Payer: Medicare Other

## 2021-03-19 ENCOUNTER — Encounter: Payer: Self-pay | Admitting: Oncology

## 2021-03-19 ENCOUNTER — Other Ambulatory Visit: Payer: Self-pay

## 2021-03-19 ENCOUNTER — Inpatient Hospital Stay: Payer: Medicare Other | Attending: Oncology | Admitting: Oncology

## 2021-03-19 VITALS — BP 150/64 | HR 70 | Temp 97.9°F | Resp 18 | Ht 68.0 in | Wt 191.0 lb

## 2021-03-19 DIAGNOSIS — R918 Other nonspecific abnormal finding of lung field: Secondary | ICD-10-CM | POA: Insufficient documentation

## 2021-03-19 DIAGNOSIS — C187 Malignant neoplasm of sigmoid colon: Secondary | ICD-10-CM

## 2021-03-19 LAB — CBC WITH DIFFERENTIAL (CANCER CENTER ONLY)
Abs Immature Granulocytes: 0.02 10*3/uL (ref 0.00–0.07)
Basophils Absolute: 0.2 10*3/uL — ABNORMAL HIGH (ref 0.0–0.1)
Basophils Relative: 2 %
Eosinophils Absolute: 0.3 10*3/uL (ref 0.0–0.5)
Eosinophils Relative: 5 %
HCT: 41.1 % (ref 39.0–52.0)
Hemoglobin: 14 g/dL (ref 13.0–17.0)
Immature Granulocytes: 0 %
Lymphocytes Relative: 29 %
Lymphs Abs: 1.8 10*3/uL (ref 0.7–4.0)
MCH: 31.2 pg (ref 26.0–34.0)
MCHC: 34.1 g/dL (ref 30.0–36.0)
MCV: 91.5 fL (ref 80.0–100.0)
Monocytes Absolute: 0.9 10*3/uL (ref 0.1–1.0)
Monocytes Relative: 15 %
Neutro Abs: 3 10*3/uL (ref 1.7–7.7)
Neutrophils Relative %: 49 %
Platelet Count: 218 10*3/uL (ref 150–400)
RBC: 4.49 MIL/uL (ref 4.22–5.81)
RDW: 15 % (ref 11.5–15.5)
WBC Count: 6.2 10*3/uL (ref 4.0–10.5)
nRBC: 0 % (ref 0.0–0.2)

## 2021-03-19 LAB — CMP (CANCER CENTER ONLY)
ALT: 21 U/L (ref 0–44)
AST: 28 U/L (ref 15–41)
Albumin: 3.8 g/dL (ref 3.5–5.0)
Alkaline Phosphatase: 93 U/L (ref 38–126)
Anion gap: 8 (ref 5–15)
BUN: 13 mg/dL (ref 8–23)
CO2: 26 mmol/L (ref 22–32)
Calcium: 9.1 mg/dL (ref 8.9–10.3)
Chloride: 104 mmol/L (ref 98–111)
Creatinine: 0.73 mg/dL (ref 0.61–1.24)
GFR, Estimated: >60 mL/min (ref >60)
Glucose, Bld: 101 mg/dL — ABNORMAL HIGH (ref 70–99)
Potassium: 3.8 mmol/L (ref 3.5–5.1)
Sodium: 138 mmol/L (ref 135–145)
Total Bilirubin: 0.6 mg/dL (ref 0.3–1.2)
Total Protein: 7.4 g/dL (ref 6.5–8.1)

## 2021-03-19 MED ORDER — SODIUM CHLORIDE 0.9% FLUSH
10.0000 mL | Freq: Once | INTRAVENOUS | Status: AC
  Administered 2021-03-19: 10 mL via INTRAVENOUS

## 2021-03-19 MED ORDER — HEPARIN SOD (PORK) LOCK FLUSH 100 UNIT/ML IV SOLN
500.0000 [IU] | Freq: Once | INTRAVENOUS | Status: AC
  Administered 2021-03-19: 500 [IU] via INTRAVENOUS

## 2021-03-19 NOTE — Progress Notes (Signed)
[place] OFFICE PROGRESS NOTE   Diagnosis: Colon cancer  INTERVAL HISTORY:   [name] returns as scheduled peer to complete another cycle of FOLFOX 03/03/2021.  He reports prolonged malaise following this cycle of chemotherapy.  No nausea/vomiting, diarrhea, or neuropathy symptoms.  He feels well at present.  He is working.  Objective:  Vital signs in last 24 hours:  Blood pressure (!) 150/64, pulse 70, temperature 97.9 F (36.6 C), temperature source Oral, resp. rate 18, height '5\' 8"'  (1.727 m), weight 191 lb (86.6 kg), SpO2 100 %.    HEENT: No thrush or ulcers Resp: Lungs clear bilaterally Cardio: Regular rate and rhythm GI: No hepatomegaly, left lower quadrant colostomy with brown stool Vascular: No leg edema   Portacath/PICC-without erythema  Lab Results:  Lab Results  Component Value Date   WBC 5.3 03/03/2021   HGB 14.1 03/03/2021   HCT 41.8 03/03/2021   MCV 93.1 03/03/2021   PLT 210 03/03/2021   NEUTROABS 2.7 03/03/2021    CMP  Lab Results  Component Value Date   NA 138 03/03/2021   K 3.7 03/03/2021   CL 105 03/03/2021   CO2 25 03/03/2021   GLUCOSE 103 (H) 03/03/2021   BUN 13 03/03/2021   CREATININE 0.79 03/03/2021   CALCIUM 9.2 03/03/2021   PROT 7.3 03/03/2021   ALBUMIN 3.8 03/03/2021   AST 28 03/03/2021   ALT 21 03/03/2021   ALKPHOS 78 03/03/2021   BILITOT 0.5 03/03/2021   GFRNONAA >60 03/03/2021    Lab Results  Component Value Date   CEA1 63.2 (H) 09/19/2020   CEA 6.53 (H) 03/03/2021    No results found for: INR, LABPROT  Imaging:  NM PET Image Initial (PI) Skull Base To Thigh (F-18 FDG)  Result Date: 03/17/2021 CLINICAL DATA:  Initial treatment strategy for colon cancer with lung lesions. EXAM: NUCLEAR MEDICINE PET SKULL BASE TO THIGH TECHNIQUE: 9.6 mCi F-18 FDG was injected intravenously. Full-ring PET imaging was performed from the skull base to thigh after the radiotracer. CT data was obtained and used for  attenuation correction and anatomic localization. Fasting blood glucose: 116 mg/dl COMPARISON:  CT chest 12/25/2020 and CT abdomen pelvis 09/19/2020. FINDINGS: Mediastinal blood pool activity: SUV max 2.3 Liver activity: SUV max NA NECK: No abnormal hypermetabolism. Incidental CT findings: None. CHEST: No hypermetabolic mediastinal, hilar or axillary lymph nodes. Clustered peribronchovascular nodularity in the apical and posterior segments of the right upper lobe has an SUV max 2.9 and is new from 12/25/2020. Additional scattered hematogenously distributed pulmonary nodules are predominantly subcentimeter in size and too small for PET resolution. A 10 mm nodule in the medial segment right middle lobe (8/41) appears similar to 12/25/2020 with an SUV max of 1.8. Size and distribution of nodules appears generally similar to 12/25/2020 given respiratory motion artifact on the current study. A pleural nodule in the posterior lower left hemithorax (8/61) is mildly hypermetabolic, SUV max 2.9. Subpleural volume loss in the medial left lower lobe (8/65) does not show metabolism above blood pool. Incidental CT findings: Left-sided Port-A-Cath terminates in the SVC. Atherosclerotic calcification of the aorta, aortic valve and coronary arteries. Heart is at the upper limits of normal in size. No pericardial or pleural effusion. ABDOMEN/PELVIS: No abnormal hypermetabolism in the liver, spleen, adrenal glands or pancreas. No hypermetabolic lymph nodes. Incidental CT findings: Liver is unremarkable. Tiny stones in the gallbladder. Right adrenal gland is unremarkable. 11 mm fluid density nodule in the medial limb left adrenal gland. Kidneys,  spleen, pancreas, stomach and majority of the bowel are unremarkable. Left lower quadrant colostomy. SKELETON: No abnormal hypermetabolism. Incidental CT findings: Degenerative changes in the spine. IMPRESSION: 1. Scattered hematogenously distributed pulmonary nodules are generally too small  for PET resolution. A 10 mm nodule in the right middle lobe is borderline in size for PET resolution but does show mild FDG uptake. Pleural nodule in the posterior left hemithorax is mildly hypermetabolic. Collectively, findings are worrisome for metastatic disease. 2. New clustered peribronchovascular nodularity in the apical and posterior segments of the right upper lobe, most indicative of an infectious bronchiolitis. 3. Cholelithiasis. 4. Left adrenal adenoma. 5. Aortic atherosclerosis (ICD10-I70.0). Coronary artery calcification. 6. Electronically Signed   By: Lorin Picket M.D.   On: 03/17/2021 16:23    Medications: I have reviewed the patient's current medications.   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), status post a sigmoid colectomy and partial bladder resection 09/19/2020 2 separate sigmoid colon tumors, 2.5 cm apart, 1/13 lymph nodes positive, tumor extends to adherent soft tissue of the bladder, MSS, normal mismatch repair protein expression tumor mutation burden 0, K-ras G12D CT abdomen/pelvis 09/19/2020-sigmoid colon mass with colonic obstruction, 6 mm right lower lobe nodule CT chest 09/22/2020-multiple bilateral lung nodules consistent with metastases Elevated preoperative CEA Cycle 1 FOLFOX 10/28/2020 Cycle 2 FOLFOX 11/11/2020 Cycle 3 FOLFOX 11/25/2020 Cycle 4 FOLFOX 12/09/2020 Cycle 5 FOLFOX 12/23/2020 CT chest 12/25/2020-unchanged lung nodules, 2 cm subpleural nodule in the left posterior chest more prominent-loculated fluid? Cycle 6 FOLFOX 02/03/2021-5-FU dose reduced and bolus eliminated secondary to diarrhea Cycle 7 FOLFOX 02/17/2021 Cycle 8 FOLFOX 03/03/2021 Pulmonary nodules without significant change, 10 mm nodule in right middle lobe with mild FDG uptake, mildly hypermetabolic pleural nodule in the lower left hemothorax, new clustered nodularity in the apical and posterior right upper lobe-likely infectious   2.  Port-A-Cath placement-Dr. 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 has completed 8 cycles of FOLFOX.  He tolerated the chemotherapy well.  The restaging PET scan reveals bilateral lung nodules concerning for metastatic disease.  I discussed the PET findings and reviewed the images with Mr. Majeed.  He understands the lung nodules most likely represent metastases.  They have not changed significantly during the course of chemotherapy.  We decided to follow him off of systemic therapy.  He will contact Dr. Ninfa Linden to discuss reversal of the colostomy.  He will return for an office visit, CEA, and Port-A-Cath flush in 7 weeks.  We will plan for a restaging chest CT at a 62-monthinterval.  GBetsy Coder MD  03/19/2021  8:17 AM

## 2021-04-28 DIAGNOSIS — C7800 Secondary malignant neoplasm of unspecified lung: Secondary | ICD-10-CM | POA: Diagnosis not present

## 2021-04-28 DIAGNOSIS — Z933 Colostomy status: Secondary | ICD-10-CM | POA: Diagnosis not present

## 2021-04-28 DIAGNOSIS — C189 Malignant neoplasm of colon, unspecified: Secondary | ICD-10-CM | POA: Diagnosis not present

## 2021-05-07 ENCOUNTER — Inpatient Hospital Stay: Payer: Medicare Other

## 2021-05-07 ENCOUNTER — Inpatient Hospital Stay: Payer: Medicare Other | Attending: Oncology | Admitting: Oncology

## 2021-05-07 VITALS — BP 143/73 | HR 72 | Temp 98.1°F | Resp 18 | Ht 68.0 in | Wt 197.8 lb

## 2021-05-07 DIAGNOSIS — C779 Secondary and unspecified malignant neoplasm of lymph node, unspecified: Secondary | ICD-10-CM | POA: Insufficient documentation

## 2021-05-07 DIAGNOSIS — C187 Malignant neoplasm of sigmoid colon: Secondary | ICD-10-CM

## 2021-05-07 LAB — CEA (ACCESS): CEA (CHCC): 9.01 ng/mL — ABNORMAL HIGH (ref 0.00–5.00)

## 2021-05-07 MED ORDER — HEPARIN SOD (PORK) LOCK FLUSH 100 UNIT/ML IV SOLN
500.0000 [IU] | Freq: Once | INTRAVENOUS | Status: AC
  Administered 2021-05-07: 500 [IU] via INTRAVENOUS

## 2021-05-07 MED ORDER — SODIUM CHLORIDE 0.9% FLUSH
10.0000 mL | Freq: Once | INTRAVENOUS | Status: AC
  Administered 2021-05-07: 10 mL via INTRAVENOUS

## 2021-05-07 NOTE — Progress Notes (Signed)
?  [place] ?OFFICE PROGRESS NOTE ? ? ?Diagnosis: Colon cancer ? ?INTERVAL HISTORY:  ? ?Craig Best returns as scheduled.  No complaint.  He is working.  No neuropathy symptoms.  He saw Dr. Ninfa Linden and has been referred to Dr. Dema Severin for colostomy reversal. ? ?Objective: ? ?Vital signs in last 24 hours: ? ?Blood pressure (!) 143/73, pulse 72, temperature 98.1 ?F (36.7 ?C), temperature source Oral, resp. rate 18, height 5' 8" (1.727 m), weight 197 lb 12.8 oz (89.7 kg), SpO2 100 %. ? ?Resp: Lungs clear bilaterally ?Cardio: Regular rate and rhythm ?GI: No hepatomegaly, left lower quadrant colostomy ?Vascular: No leg edema, the right lower leg is larger than the left side ?  ? ?Portacath/PICC-without erythema ? ?Lab Results: ? ?Lab Results  ?Component Value Date  ? WBC 6.2 03/19/2021  ? HGB 14.0 03/19/2021  ? HCT 41.1 03/19/2021  ? MCV 91.5 03/19/2021  ? PLT 218 03/19/2021  ? NEUTROABS 3.0 03/19/2021  ? ? ?CMP  ?Lab Results  ?Component Value Date  ? NA 138 03/19/2021  ? K 3.8 03/19/2021  ? CL 104 03/19/2021  ? CO2 26 03/19/2021  ? GLUCOSE 101 (H) 03/19/2021  ? BUN 13 03/19/2021  ? CREATININE 0.73 03/19/2021  ? CALCIUM 9.1 03/19/2021  ? PROT 7.4 03/19/2021  ? ALBUMIN 3.8 03/19/2021  ? AST 28 03/19/2021  ? ALT 21 03/19/2021  ? ALKPHOS 93 03/19/2021  ? BILITOT 0.6 03/19/2021  ? GFRNONAA >60 03/19/2021  ? ? ?Lab Results  ?Component Value Date  ? CEA1 63.2 (H) 09/19/2020  ? CEA 6.53 (H) 03/03/2021  ? ? ?Medications: I have reviewed the patient's current medications. ? ? 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), status post a sigmoid colectomy and partial bladder resection 09/19/2020 ?2 separate sigmoid colon tumors, 2.5 cm apart, 1/13 lymph nodes positive, tumor extends to adherent soft tissue of the bladder, MSS, normal mismatch repair protein expression tumor mutation burden 0, K-ras G12D ?CT abdomen/pelvis 09/19/2020-sigmoid colon mass with colonic obstruction, 6 mm right lower lobe  nodule ?CT chest 09/22/2020-multiple bilateral lung nodules consistent with metastases ?Elevated preoperative CEA ?Cycle 1 FOLFOX 10/28/2020 ?Cycle 2 FOLFOX 11/11/2020 ?Cycle 3 FOLFOX 11/25/2020 ?Cycle 4 FOLFOX 12/09/2020 ?Cycle 5 FOLFOX 12/23/2020 ?CT chest 12/25/2020-unchanged lung nodules, 2 cm subpleural nodule in the left posterior chest more prominent-loculated fluid? ?Cycle 6 FOLFOX 02/03/2021-5-FU dose reduced and bolus eliminated secondary to diarrhea ?Cycle 7 FOLFOX 02/17/2021 ?Cycle 8 FOLFOX 03/03/2021 ?PET 03/17/2021 pulmonary nodules without significant change, 10 mm nodule in right middle lobe with mild FDG uptake, mildly hypermetabolic pleural nodule in the lower left hemothorax, new clustered nodularity in the apical and posterior right upper lobe-likely infectious ?  ?2.  Port-A-Cath placement-Dr. Ninfa Linden 10/07/2020 ?  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  ? ? ?Disposition: ?Mr. Justiniano appears stable.  No clinical evidence for progression of the colon cancer.  He appears to be a candidate for colostomy reversal.  He is scheduled to see Dr. Dema Severin in a few weeks.  He will return for an office visit and Port-A-Cath flush in 6 weeks.  We will follow-up on the CEA from today.  We will plan for a restaging chest CT after he undergoes the colostomy reversal. ? ?Betsy Coder, MD ? ?05/07/2021  ?8:37 AM ? ? ?

## 2021-05-27 DIAGNOSIS — C7800 Secondary malignant neoplasm of unspecified lung: Secondary | ICD-10-CM | POA: Diagnosis not present

## 2021-05-27 DIAGNOSIS — Z933 Colostomy status: Secondary | ICD-10-CM | POA: Diagnosis not present

## 2021-05-27 DIAGNOSIS — C187 Malignant neoplasm of sigmoid colon: Secondary | ICD-10-CM | POA: Diagnosis not present

## 2021-05-27 DIAGNOSIS — C189 Malignant neoplasm of colon, unspecified: Secondary | ICD-10-CM | POA: Diagnosis not present

## 2021-05-29 ENCOUNTER — Other Ambulatory Visit: Payer: Self-pay | Admitting: Surgery

## 2021-05-29 DIAGNOSIS — C189 Malignant neoplasm of colon, unspecified: Secondary | ICD-10-CM

## 2021-05-29 DIAGNOSIS — C187 Malignant neoplasm of sigmoid colon: Secondary | ICD-10-CM

## 2021-05-29 DIAGNOSIS — Z933 Colostomy status: Secondary | ICD-10-CM

## 2021-06-01 ENCOUNTER — Other Ambulatory Visit: Payer: Self-pay | Admitting: Surgery

## 2021-06-01 DIAGNOSIS — C189 Malignant neoplasm of colon, unspecified: Secondary | ICD-10-CM

## 2021-06-01 DIAGNOSIS — Z933 Colostomy status: Secondary | ICD-10-CM

## 2021-06-01 DIAGNOSIS — C187 Malignant neoplasm of sigmoid colon: Secondary | ICD-10-CM

## 2021-06-18 ENCOUNTER — Telehealth: Payer: Self-pay

## 2021-06-18 ENCOUNTER — Inpatient Hospital Stay: Payer: Medicare Other | Admitting: Oncology

## 2021-06-18 ENCOUNTER — Inpatient Hospital Stay: Payer: Medicare Other

## 2021-06-18 ENCOUNTER — Encounter: Payer: Self-pay | Admitting: Oncology

## 2021-06-18 ENCOUNTER — Inpatient Hospital Stay: Payer: Medicare Other | Attending: Oncology

## 2021-06-18 ENCOUNTER — Other Ambulatory Visit: Payer: Self-pay

## 2021-06-18 ENCOUNTER — Encounter: Payer: Self-pay | Admitting: *Deleted

## 2021-06-18 VITALS — BP 147/69 | HR 62 | Temp 98.1°F | Resp 18 | Ht 68.0 in | Wt 199.6 lb

## 2021-06-18 DIAGNOSIS — R970 Elevated carcinoembryonic antigen [CEA]: Secondary | ICD-10-CM | POA: Insufficient documentation

## 2021-06-18 DIAGNOSIS — C187 Malignant neoplasm of sigmoid colon: Secondary | ICD-10-CM | POA: Diagnosis not present

## 2021-06-18 DIAGNOSIS — R918 Other nonspecific abnormal finding of lung field: Secondary | ICD-10-CM | POA: Diagnosis not present

## 2021-06-18 LAB — CEA (ACCESS): CEA (CHCC): 9.53 ng/mL — ABNORMAL HIGH (ref 0.00–5.00)

## 2021-06-18 MED ORDER — LIDOCAINE-PRILOCAINE 2.5-2.5 % EX CREA: 1.0000 "application " | TOPICAL_CREAM | CUTANEOUS | 0 refills | Status: DC | PRN

## 2021-06-18 NOTE — Progress Notes (Signed)
?  [place] ?OFFICE PROGRESS NOTE ? ? ?Diagnosis: Colon cancer ? ?INTERVAL HISTORY:  ? ?Craig Best returns as scheduled.  He feels well.  He is working.  Good appetite.  No new complaint.  He saw [facility] and has been referred for a colonoscopy and Gastrografin enema prior to undergoing colostomy reversal. ? ?Objective: ? ?Vital signs in last 24 hours: ? ?Blood pressure (!) 147/69, pulse 62, temperature 98.1 ?F (36.7 ?C), temperature source Oral, resp. rate 18, height _0  (1.727 m), weight 199 lb 9.6 oz (90.5 kg), SpO2 98 %. ?  ? ?Lymphatics: No cervical, supraclavicular, axillary, or inguinal nodes ?Resp: Lungs clear bilaterally ?Cardio: Regular rate and rhythm ?GI: No hepatosplenomegaly, left lower quadrant colostomy ?Vascular: No leg edema ? ? ?Lab Results: ? ?Lab Results  ?Component Value Date  ? WBC 6.2 03/19/2021  ? HGB 14.0 03/19/2021  ? HCT 41.1 03/19/2021  ? MCV 91.5 03/19/2021  ? PLT 218 03/19/2021  ? NEUTROABS 3.0 03/19/2021  ? ? ?CMP  ?Lab Results  ?Component Value Date  ? NA 138 03/19/2021  ? K 3.8 03/19/2021  ? CL 104 03/19/2021  ? CO2 26 03/19/2021  ? GLUCOSE 101 (H) 03/19/2021  ? BUN 13 03/19/2021  ? CREATININE 0.73 03/19/2021  ? CALCIUM 9.1 03/19/2021  ? PROT 7.4 03/19/2021  ? ALBUMIN 3.8 03/19/2021  ? AST 28 03/19/2021  ? ALT 21 03/19/2021  ? ALKPHOS 93 03/19/2021  ? BILITOT 0.6 03/19/2021  ? GFRNONAA >60 03/19/2021  ? ? ?Lab Results  ?Component Value Date  ? CEA1 63.2 (H) 09/19/2020  ? CEA 9.01 (H) 05/07/2021  ? ? ? ?Medications: I have reviewed the patient's current medications. ? ? 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), status post a sigmoid colectomy and partial bladder resection 09/19/2020 ?2 separate sigmoid colon tumors, 2.5 cm apart, 1/13 lymph nodes positive, tumor extends to adherent soft tissue of the bladder, MSS, normal mismatch repair protein expression tumor mutation burden 0, K-ras G12D ?CT abdomen/pelvis 09/19/2020-sigmoid colon mass with  colonic obstruction, 6 mm right lower lobe nodule ?CT chest 09/22/2020-multiple bilateral lung nodules consistent with metastases ?Elevated preoperative CEA ?Cycle 1 FOLFOX 10/28/2020 ?Cycle 2 FOLFOX 11/11/2020 ?Cycle 3 FOLFOX 11/25/2020 ?Cycle 4 FOLFOX 12/09/2020 ?Cycle 5 FOLFOX 12/23/2020 ?CT chest 12/25/2020-unchanged lung nodules, 2 cm subpleural nodule in the left posterior chest more prominent-loculated fluid? ?Cycle 6 FOLFOX 02/03/2021-5-FU dose reduced and bolus eliminated secondary to diarrhea ?Cycle 7 FOLFOX 02/17/2021 ?Cycle 8 FOLFOX 03/03/2021 ?PET 03/17/2021 pulmonary nodules without significant change, 10 mm nodule in right middle lobe with mild FDG uptake, mildly hypermetabolic pleural nodule in the lower left hemothorax, new clustered nodularity in the apical and posterior right upper lobe-likely infectious ?  ?2.  Port-A-Cath placement-Craig Best 10/07/2020 ?  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  ? ?Disposition: ?Craig Best appears unchanged.  I remain concerned he has metastatic colon cancer based on the pulmonary nodules and elevated CEA.  We will follow-up on the CEA from today. ? ?He has seen [facility] Best to consider reversal of the colostomy.  He will undergo a colonoscopy prior to surgery.  He prefers [facility].  I made a referral today. ? ?Craig Best will return for an office visit and restaging chest CT in 6 weeks. ? ?Craig Coder, MD ? ?06/18/2021  ?9:00 AM ? ? ?

## 2021-06-18 NOTE — Telephone Encounter (Signed)
-----   Message from Ladell Pier, MD sent at 06/18/2021 10:47 AM EDT ----- ?Please call patient, the CEA is stable, follow-up as scheduled ? ?

## 2021-06-18 NOTE — Progress Notes (Signed)
Faxed referral order, demographics and chart information to GI office of Dr. Acquanetta Sit [phone] in [facility]. ?

## 2021-06-18 NOTE — Telephone Encounter (Signed)
TC to Pt spoke with Pt's wife who relayed the message to Pt. Pt verbalized understanding. ?

## 2021-06-19 ENCOUNTER — Telehealth: Payer: Self-pay | Admitting: *Deleted

## 2021-06-19 ENCOUNTER — Encounter: Payer: Self-pay | Admitting: Oncology

## 2021-06-19 NOTE — Telephone Encounter (Signed)
Notified Lelan Pons of CT chest on 6/15 at 0815/0830. Will move the lab/flush to after scan since his scan is without contrast. No prep for scan since it is without contrast. Notified scheduler to make changes and call as well as managed care to obtain PA. ?

## 2021-07-09 DIAGNOSIS — C187 Malignant neoplasm of sigmoid colon: Secondary | ICD-10-CM | POA: Diagnosis not present

## 2021-07-22 ENCOUNTER — Telehealth: Payer: Self-pay

## 2021-07-22 ENCOUNTER — Other Ambulatory Visit: Payer: Self-pay

## 2021-07-22 ENCOUNTER — Other Ambulatory Visit: Payer: Self-pay | Admitting: Nurse Practitioner

## 2021-07-22 DIAGNOSIS — C187 Malignant neoplasm of sigmoid colon: Secondary | ICD-10-CM

## 2021-07-22 NOTE — Telephone Encounter (Signed)
Pt's wife called today requesting if CT order can be changed to add abdomen and pelvis because Pt is having nausea, and vomiting, and watery output from ostomy. Pt's wife worried it might be a blockage. Relayed to wife recommendation is to go to the ER but Pt's wife stated Pt will decline going to ER. Order was added wife informed she must come to pick up contrast and Pt is to be npo at midnight and drink contrast at 6:30 and again at 7:30 Informed Pt's wife to be here at 745 for lab appointment. Pt's wife verbalized understanding.

## 2021-07-23 ENCOUNTER — Inpatient Hospital Stay: Payer: Medicare Other

## 2021-07-23 ENCOUNTER — Other Ambulatory Visit: Payer: Medicare Other

## 2021-07-23 ENCOUNTER — Inpatient Hospital Stay: Payer: Medicare Other | Attending: Oncology | Admitting: Oncology

## 2021-07-23 ENCOUNTER — Other Ambulatory Visit: Payer: Self-pay

## 2021-07-23 ENCOUNTER — Telehealth: Payer: Self-pay

## 2021-07-23 ENCOUNTER — Ambulatory Visit (HOSPITAL_BASED_OUTPATIENT_CLINIC_OR_DEPARTMENT_OTHER)
Admission: RE | Admit: 2021-07-23 | Discharge: 2021-07-23 | Disposition: A | Discharge status: Home / Self Care | Payer: Medicare Other | Source: Ambulatory Visit | Attending: Oncology | Admitting: Oncology

## 2021-07-23 VITALS — BP 144/65 | HR 98 | Temp 98.1°F | Resp 18 | Wt 198.0 lb

## 2021-07-23 DIAGNOSIS — J90 Pleural effusion, not elsewhere classified: Secondary | ICD-10-CM | POA: Diagnosis not present

## 2021-07-23 DIAGNOSIS — C187 Malignant neoplasm of sigmoid colon: Secondary | ICD-10-CM | POA: Insufficient documentation

## 2021-07-23 DIAGNOSIS — C7802 Secondary malignant neoplasm of left lung: Secondary | ICD-10-CM | POA: Insufficient documentation

## 2021-07-23 DIAGNOSIS — R918 Other nonspecific abnormal finding of lung field: Secondary | ICD-10-CM | POA: Diagnosis not present

## 2021-07-23 DIAGNOSIS — C189 Malignant neoplasm of colon, unspecified: Secondary | ICD-10-CM | POA: Diagnosis not present

## 2021-07-23 DIAGNOSIS — C7801 Secondary malignant neoplasm of right lung: Secondary | ICD-10-CM | POA: Insufficient documentation

## 2021-07-23 DIAGNOSIS — K8020 Calculus of gallbladder without cholecystitis without obstruction: Secondary | ICD-10-CM | POA: Diagnosis not present

## 2021-07-23 LAB — CMP (CANCER CENTER ONLY)
ALT: 13 U/L (ref 0–44)
AST: 16 U/L (ref 15–41)
Albumin: 4.2 g/dL (ref 3.5–5.0)
Alkaline Phosphatase: 74 U/L (ref 38–126)
Anion gap: 8 (ref 5–15)
BUN: 16 mg/dL (ref 8–23)
CO2: 31 mmol/L (ref 22–32)
Calcium: 10.2 mg/dL (ref 8.9–10.3)
Chloride: 99 mmol/L (ref 98–111)
Creatinine: 1.03 mg/dL (ref 0.61–1.24)
GFR, Estimated: >60 mL/min (ref >60)
Glucose, Bld: 112 mg/dL — ABNORMAL HIGH (ref 70–99)
Potassium: 4.6 mmol/L (ref 3.5–5.1)
Sodium: 138 mmol/L (ref 135–145)
Total Bilirubin: 0.6 mg/dL (ref 0.3–1.2)
Total Protein: 8.2 g/dL — ABNORMAL HIGH (ref 6.5–8.1)

## 2021-07-23 LAB — CBC WITH DIFFERENTIAL (CANCER CENTER ONLY)
Abs Immature Granulocytes: 0.04 10*3/uL (ref 0.00–0.07)
Basophils Absolute: 0.1 10*3/uL (ref 0.0–0.1)
Basophils Relative: 1 %
Eosinophils Absolute: 0.2 10*3/uL (ref 0.0–0.5)
Eosinophils Relative: 2 %
HCT: 49.2 % (ref 39.0–52.0)
Hemoglobin: 16.2 g/dL (ref 13.0–17.0)
Immature Granulocytes: 0 %
Lymphocytes Relative: 22 %
Lymphs Abs: 2.1 10*3/uL (ref 0.7–4.0)
MCH: 31 pg (ref 26.0–34.0)
MCHC: 32.9 g/dL (ref 30.0–36.0)
MCV: 94.3 fL (ref 80.0–100.0)
Monocytes Absolute: 0.7 10*3/uL (ref 0.1–1.0)
Monocytes Relative: 8 %
Neutro Abs: 6.4 10*3/uL (ref 1.7–7.7)
Neutrophils Relative %: 67 %
Platelet Count: 445 10*3/uL — ABNORMAL HIGH (ref 150–400)
RBC: 5.22 MIL/uL (ref 4.22–5.81)
RDW: 13.3 % (ref 11.5–15.5)
WBC Count: 9.6 10*3/uL (ref 4.0–10.5)
nRBC: 0 % (ref 0.0–0.2)

## 2021-07-23 LAB — CEA (ACCESS): CEA (CHCC): 11.77 ng/mL — ABNORMAL HIGH (ref 0.00–5.00)

## 2021-07-23 MED ORDER — HEPARIN SOD (PORK) LOCK FLUSH 100 UNIT/ML IV SOLN
500.0000 [IU] | Freq: Once | INTRAVENOUS | Status: AC
  Administered 2021-07-23: 500 [IU] via INTRAVENOUS

## 2021-07-23 MED ORDER — SODIUM CHLORIDE 0.9% FLUSH
10.0000 mL | Freq: Once | INTRAVENOUS | Status: AC
  Administered 2021-07-23: 10 mL via INTRAVENOUS

## 2021-07-23 NOTE — Progress Notes (Signed)
[place] OFFICE PROGRESS NOTE   Diagnosis: Colon cancer  INTERVAL HISTORY:   ` Mr. Marti returns as scheduled.  He continues working.  He had belching and decreased output from the ostomy earlier this week.  This has improved.  Occasional nausea.  He is eating.  His significant other reports he has exertional dyspnea and orthopnea.  Objective:  Vital signs in last 24 hours:  Blood pressure (!) 144/65, pulse 98, temperature 98.1 F (36.7 C), temperature source Oral, resp. rate 18, weight 198 lb (89.8 kg), SpO2 100 %.    Resp: Diminished breath sounds throughout the left chest, no respiratory distress Cardio: Regular rate and rhythm GI: No hepatosplenomegaly, left lower quadrant colostomy with brown stool Vascular: No leg edema    Portacath/PICC-without erythema  Lab Results:  Lab Results  Component Value Date   WBC 9.6 07/23/2021   HGB 16.2 07/23/2021   HCT 49.2 07/23/2021   MCV 94.3 07/23/2021   PLT 445 (H) 07/23/2021   NEUTROABS 6.4 07/23/2021    CMP  Lab Results  Component Value Date   NA 138 07/23/2021   K 4.6 07/23/2021   CL 99 07/23/2021   CO2 31 07/23/2021   GLUCOSE 112 (H) 07/23/2021   BUN 16 07/23/2021   CREATININE 1.03 07/23/2021   CALCIUM 10.2 07/23/2021   PROT 8.2 (H) 07/23/2021   ALBUMIN 4.2 07/23/2021   AST 16 07/23/2021   ALT 13 07/23/2021   ALKPHOS 74 07/23/2021   BILITOT 0.6 07/23/2021   GFRNONAA >60 07/23/2021    Lab Results  Component Value Date   CEA1 63.2 (H) 09/19/2020   CEA 11.77 (H) 07/23/2021    No results found for: "INR", "LABPROT"  Imaging:  CT CHEST ABDOMEN PELVIS WO CONTRAST  Result Date: 07/23/2021 CLINICAL DATA:  Restaging colon cancer. EXAM: CT CHEST, ABDOMEN AND PELVIS WITHOUT CONTRAST TECHNIQUE: Multidetector CT imaging of the chest, abdomen and pelvis was performed following the standard protocol without IV contrast. RADIATION DOSE REDUCTION: This exam was performed according to the  departmental dose-optimization program which includes automated exposure control, adjustment of the mA and/or kV according to patient size and/or use of iterative reconstruction technique. COMPARISON:  03/17/2021 FINDINGS: CT CHEST FINDINGS Cardiovascular: The heart size appears within normal limits. Mild aortic atherosclerosis. Coronary artery calcification noted. No pericardial effusion. Mediastinum/Nodes: Thyroid gland, trachea and esophagus demonstrate no significant findings. No enlarged supraclavicular, axillary, or mediastinal lymph nodes. Lungs/Pleura: There is a new moderate left pleural effusion. Although limited by lack of IV contrast material, pleural nodules are identified overlying the left lung including: -posteromedial left lower pleural nodule measures approximately 1.6 x 2.3 cm, image 44/2. New from previous exam. -posterior left lower pleural nodule measures 1.3 by 2.1 cm, image 44/2. Formally 1.6 x 2.2 cm. -posterolateral left lower pleural nodule measures 1.6 x 2.0 cm, image 44/2. New from previous exam. Multiple pulmonary nodules are a again noted, including: -inferior right upper lobe lung nodule measures 1.6 cm, image 70/2. Formally 1 cm. -inferior medial right lower lobe lung nodule measures 0.8 cm, image 96/3. Formally 0.7 cm. -Anterior right upper lobe lung nodule measures 8 mm, image 46/3. Formally 7 mm. -Perifissural nodule along the minor fissure measures 1 cm, image 65/3. Previously 7 mm. Musculoskeletal: No suspicious bone lesions identified. CT ABDOMEN PELVIS FINDINGS Hepatobiliary: Contour of the liver is slightly irregular with relative hypertrophy of the lateral segment of left hepatic lobe, image 51/2. Within the limitations of unenhanced technique there are no suspicious  liver lesions. Small stones noted within the gallbladder. No signs of gallbladder inflammation or biliary ductal dilatation. Pancreas: Unremarkable. No pancreatic ductal dilatation or surrounding inflammatory  changes. Spleen: Normal in size without focal abnormality. Adrenals/Urinary Tract: Adrenal glands are unremarkable. Kidneys are normal, without renal calculi, focal lesion, or hydronephrosis. Bladder is unremarkable. Stomach/Bowel: Stomach appears normal. Left lower quadrant descending colostomy is identified. Hartmann's pouch identified within the pelvis with anastomotic suture line at the level of the distal sigmoid colon, image 108/2. No pathologic dilatation of the large or small bowel loops. No bowel wall thickening or inflammation identified. Vascular/Lymphatic: No significant vascular findings are present. No enlarged abdominal or pelvic lymph nodes. Reproductive: Prostate is unremarkable. Other: No ascites. Soft tissue nodule along the left pericolic gutter measures 1 x 1.2 cm, image 87/2. Unchanged from previous exam. No additional peritoneal nodule or mass identified. No signs of omental cake. Musculoskeletal: No acute or significant osseous findings. IMPRESSION: 1. Interval progression of disease. 2. New moderate left pleural effusion with multiple pleural nodules overlying the left lung. Consider further evaluation with diagnostic left thoracentesis to assess for malignant pleural effusion. 3. Multiple pulmonary nodules are identified. The dominant nodule within the inferior right upper lobe has increased in size in the interval. Perifissural nodule along the minor fissure has also increased in size in the interval. Additional smaller nodules are stable to minimally increased in the interval. 4. Stable appearance of small peritoneal nodule along the left pericolic gutter. This is a nonspecific finding and could reflect postsurgical change. Peritoneal metastasis not excluded. No additional peritoneal nodules or ascites identified. 5. 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 6. Gallstones. 7. Coronary artery calcifications. 8. Aortic Atherosclerosis (ICD10-I70.0). Electronically  Signed   By: Kerby Moors M.D.   On: 07/23/2021 10:02    Medications: I have reviewed the patient's current medications.   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), status post a sigmoid colectomy and partial bladder resection 09/19/2020 2 separate sigmoid colon tumors, 2.5 cm apart, 1/13 lymph nodes positive, tumor extends to adherent soft tissue of the bladder, MSS, normal mismatch repair protein expression tumor mutation burden 0, K-ras G12D CT abdomen/pelvis 09/19/2020-sigmoid colon mass with colonic obstruction, 6 mm right lower lobe nodule CT chest 09/22/2020-multiple bilateral lung nodules consistent with metastases Elevated preoperative CEA Cycle 1 FOLFOX 10/28/2020 Cycle 2 FOLFOX 11/11/2020 Cycle 3 FOLFOX 11/25/2020 Cycle 4 FOLFOX 12/09/2020 Cycle 5 FOLFOX 12/23/2020 CT chest 12/25/2020-unchanged lung nodules, 2 cm subpleural nodule in the left posterior chest more prominent-loculated fluid? Cycle 6 FOLFOX 02/03/2021-5-FU dose reduced and bolus eliminated secondary to diarrhea Cycle 7 FOLFOX 02/17/2021 Cycle 8 FOLFOX 03/03/2021 PET 03/17/2021 pulmonary nodules without significant change, 10 mm nodule in right middle lobe with mild FDG uptake, mildly hypermetabolic pleural nodule in the lower left hemothorax, new clustered nodularity in the apical and posterior right upper lobe-likely infectious CTs 07/23/2021-new left pleural effusion with multiple pleural nodules, increased size of several pulmonary nodules, stable left paracolic gutter nodule, morphologic features suggestive of early cirrhosis   2.  Port-A-Cath placement-Dr. 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 Halteman has a history of colon cancer.  The CEA is higher and the restaging CTs today are consistent with disease progression.  He appears to have metastatic colon cancer.  He will be  referred for a diagnostic/therapeutic left thoracentesis to confirm metastatic disease and palliate  dyspnea.  We will consider referring him for placement of a Pleurx catheter if a malignant pleural effusion is confirmed.  Mr. Harvill will return for an office visit 07/28/2021.  We will discuss Pleurx catheter placement and systemic treatment options.  He will go to the emergency room for increased dyspnea.  Betsy Coder, MD  07/23/2021  10:59 AM

## 2021-07-23 NOTE — Telephone Encounter (Signed)
Pt. scheduled for Thoracentesis at [facility] for tomorrow at 34 [phone] gave Pt's wife instructions and address to [facility].

## 2021-07-24 ENCOUNTER — Other Ambulatory Visit: Payer: Self-pay | Admitting: Interventional Radiology

## 2021-07-24 ENCOUNTER — Other Ambulatory Visit: Payer: Self-pay | Admitting: Radiology

## 2021-07-24 ENCOUNTER — Ambulatory Visit
Admission: RE | Admit: 2021-07-24 | Discharge: 2021-07-24 | Disposition: A | Discharge status: Home / Self Care | Payer: Medicare Other | Source: Ambulatory Visit | Attending: Interventional Radiology | Admitting: Interventional Radiology

## 2021-07-24 ENCOUNTER — Ambulatory Visit
Admission: RE | Admit: 2021-07-24 | Discharge: 2021-07-24 | Disposition: A | Discharge status: Home / Self Care | Payer: Medicare Other | Source: Ambulatory Visit | Attending: Oncology | Admitting: Oncology

## 2021-07-24 ENCOUNTER — Other Ambulatory Visit: Payer: Self-pay | Admitting: Internal Medicine

## 2021-07-24 VITALS — BP 163/78 | HR 86

## 2021-07-24 DIAGNOSIS — Z9889 Other specified postprocedural states: Secondary | ICD-10-CM

## 2021-07-24 DIAGNOSIS — J90 Pleural effusion, not elsewhere classified: Secondary | ICD-10-CM | POA: Insufficient documentation

## 2021-07-24 DIAGNOSIS — Z85038 Personal history of other malignant neoplasm of large intestine: Secondary | ICD-10-CM | POA: Diagnosis not present

## 2021-07-24 DIAGNOSIS — C187 Malignant neoplasm of sigmoid colon: Secondary | ICD-10-CM

## 2021-07-24 DIAGNOSIS — J9811 Atelectasis: Secondary | ICD-10-CM | POA: Diagnosis not present

## 2021-07-24 DIAGNOSIS — C3431 Malignant neoplasm of lower lobe, right bronchus or lung: Secondary | ICD-10-CM | POA: Diagnosis not present

## 2021-07-24 NOTE — Procedures (Signed)
Interventional Radiology Procedure Note  Procedure: US guided thora, left.  ~3202BX removed  Complications: None  Recommendations:  - CXR now.  DC when cleared   Signed,  Dulcy Fanny. Earleen Newport, DO

## 2021-07-27 LAB — BODY FLUID CULTURE W GRAM STAIN
Culture: NO GROWTH
Gram Stain: NONE SEEN

## 2021-07-28 ENCOUNTER — Other Ambulatory Visit (HOSPITAL_COMMUNITY): Payer: Medicare Other

## 2021-07-28 ENCOUNTER — Inpatient Hospital Stay: Payer: Medicare Other | Admitting: Nurse Practitioner

## 2021-07-28 ENCOUNTER — Encounter (HOSPITAL_BASED_OUTPATIENT_CLINIC_OR_DEPARTMENT_OTHER): Payer: Self-pay | Admitting: Radiology

## 2021-07-28 ENCOUNTER — Ambulatory Visit (HOSPITAL_BASED_OUTPATIENT_CLINIC_OR_DEPARTMENT_OTHER)
Admission: RE | Admit: 2021-07-28 | Discharge: 2021-07-28 | Disposition: A | Discharge status: Home / Self Care | Payer: Medicare Other | Source: Ambulatory Visit | Attending: Nurse Practitioner | Admitting: Nurse Practitioner

## 2021-07-28 ENCOUNTER — Encounter: Payer: Self-pay | Admitting: Nurse Practitioner

## 2021-07-28 VITALS — BP 157/88 | HR 90 | Temp 98.1°F | Resp 18 | Ht 68.0 in | Wt 194.6 lb

## 2021-07-28 DIAGNOSIS — C187 Malignant neoplasm of sigmoid colon: Secondary | ICD-10-CM | POA: Insufficient documentation

## 2021-07-28 DIAGNOSIS — C7801 Secondary malignant neoplasm of right lung: Secondary | ICD-10-CM | POA: Diagnosis not present

## 2021-07-28 DIAGNOSIS — C7802 Secondary malignant neoplasm of left lung: Secondary | ICD-10-CM | POA: Diagnosis not present

## 2021-07-28 DIAGNOSIS — J9811 Atelectasis: Secondary | ICD-10-CM | POA: Diagnosis not present

## 2021-07-28 DIAGNOSIS — J90 Pleural effusion, not elsewhere classified: Secondary | ICD-10-CM | POA: Diagnosis not present

## 2021-07-28 LAB — CYTOLOGY - NON PAP

## 2021-07-28 NOTE — Progress Notes (Signed)
[place] OFFICE PROGRESS NOTE   Diagnosis: Colon cancer  INTERVAL HISTORY:   Craig Best returns as scheduled.  He is feeling much better since the thoracentesis procedure.  No shortness of breath at present.  Occasional cough.  He has a good appetite.  He denies pain.  Objective:  Vital signs in last 24 hours:  Blood pressure (!) 157/88, pulse 90, temperature 98.1 F (36.7 C), temperature source Oral, resp. rate 18, height '5\' 8"'  (1.727 m), weight 194 lb 9.6 oz (88.3 kg), SpO2 98 %.     Resp: Breath sounds diminished left lower one half lung field.  No respiratory distress. Cardio: Regular rate and rhythm. GI: No hepatosplenomegaly.  Left lower quadrant colostomy. Vascular: No leg edema. Port-A-Cath without erythema.  Lab Results:  Lab Results  Component Value Date   WBC 9.6 07/23/2021   HGB 16.2 07/23/2021   HCT 49.2 07/23/2021   MCV 94.3 07/23/2021   PLT 445 (H) 07/23/2021   NEUTROABS 6.4 07/23/2021    Imaging:  No results found.  Medications: I have reviewed the patient's current medications.  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), status post a sigmoid colectomy and partial bladder resection 09/19/2020 2 separate sigmoid colon tumors, 2.5 cm apart, 1/13 lymph nodes positive, tumor extends to adherent soft tissue of the bladder, MSS, normal mismatch repair protein expression tumor mutation burden 0, K-ras G12D CT abdomen/pelvis 09/19/2020-sigmoid colon mass with colonic obstruction, 6 mm right lower lobe nodule CT chest 09/22/2020-multiple bilateral lung nodules consistent with metastases Elevated preoperative CEA Cycle 1 FOLFOX 10/28/2020 Cycle 2 FOLFOX 11/11/2020 Cycle 3 FOLFOX 11/25/2020 Cycle 4 FOLFOX 12/09/2020 Cycle 5 FOLFOX 12/23/2020 CT chest 12/25/2020-unchanged lung nodules, 2 cm subpleural nodule in the left posterior chest more prominent-loculated fluid? Cycle 6 FOLFOX 02/03/2021-5-FU dose reduced and bolus  eliminated secondary to diarrhea Cycle 7 FOLFOX 02/17/2021 Cycle 8 FOLFOX 03/03/2021 PET 03/17/2021 pulmonary nodules without significant change, 10 mm nodule in right middle lobe with mild FDG uptake, mildly hypermetabolic pleural nodule in the lower left hemothorax, new clustered nodularity in the apical and posterior right upper lobe-likely infectious CTs 07/23/2021-new left pleural effusion with multiple pleural nodules, increased size of several pulmonary nodules, stable left paracolic gutter nodule, morphologic features suggestive of early cirrhosis Thoracentesis 07/24/2021-1200 cc of fluid removed, cytology pending   2.  Port-A-Cath placement-Dr. 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  Marked improvement in dyspnea following the recent thoracentesis.  Cytology report not yet available.  He and his significant other understand the likelihood of the pleural effusion being malignant.  We are obtaining a follow-up chest x-ray today.  Craig Best reports he will agree to beginning treatment if the pleural effusion is confirmed to be malignant.  Dr. Gearldine Shown recommendation is for systemic therapy on the FOLFIRI regimen.  Potential side effects associated with irinotecan reviewed including bone marrow toxicity, nausea, hair loss, diarrhea.  He has had 5-fluorouracil in the past.  We also discussed Avastin and potential toxicities including hypertension, proteinuria, bleeding, allergic reaction, bowel perforation, increased risk of blood clots/strokes, posterior reversible encephalopathy, delayed wound healing.  He agrees to FOLFIRI, initially declines Avastin.  We will contact him once the cytology report is available.  We are tentatively scheduling him to begin treatment with FOLFIRI next week.  We will see him in follow-up prior to cycle 2.  He will contact  the  office in the interim with any problems.  Patient seen with Dr. Benay Spice.    Ned Card ANP/GNP-BC   07/28/2021  11:57 AM This was a shared visit with Ned Card.  Craig Best was interviewed and examined.  The cytology from the thoracentesis is pending.  There is clinical and radiologic evidence of disease progression.  The CEA is higher.  We discussed treatment options.  I recommend treatment with FOLFIRI/bevacizumab.  The plan is to proceed with FOLFIRI and have further discussions regarding the addition of bevacizumab.  We reviewed potential toxicities associated with the FOLFIRI regimen.  A chemotherapy plan was entered today.  I was present for greater than 50% of today's visit.  I performed medical decision making.  Craig Manson, MD

## 2021-07-28 NOTE — Progress Notes (Signed)
DISCONTINUE ON PATHWAY REGIMEN - Colorectal     A cycle is every 14 days:     Oxaliplatin      Leucovorin      Fluorouracil      Fluorouracil   **Always confirm dose/schedule in your pharmacy ordering system**  REASON: Disease Progression PRIOR TREATMENT: [id number]: [id number] q14 Days TREATMENT RESPONSE: Progressive Disease (PD)  START ON PATHWAY REGIMEN - Colorectal     A cycle is every 14 days:     Bevacizumab-xxxx      Irinotecan      Leucovorin      Fluorouracil      Fluorouracil   **Always confirm dose/schedule in your pharmacy ordering system**  Patient Characteristics: Distant Metastases, Nonsurgical Candidate, KRAS/NRAS Mutation Positive/Unknown (BRAF V600 Wild-Type/Unknown), Standard Cytotoxic Therapy, Second Line Standard Cytotoxic Therapy, Bevacizumab Eligible Tumor Location: Colon Therapeutic Status: Distant Metastases Microsatellite/Mismatch Repair Status: MSS/pMMR BRAF Mutation Status: Wild-Type (no mutation) KRAS/NRAS Mutation Status: Mutation Positive Standard Cytotoxic Line of Therapy: Second Line Standard Cytotoxic Therapy Bevacizumab Eligibility: Eligible Intent of Therapy: Non-Curative / Palliative Intent, Discussed with Patient

## 2021-07-29 ENCOUNTER — Other Ambulatory Visit: Payer: Self-pay | Admitting: Nurse Practitioner

## 2021-07-29 DIAGNOSIS — C187 Malignant neoplasm of sigmoid colon: Secondary | ICD-10-CM

## 2021-08-02 ENCOUNTER — Other Ambulatory Visit: Payer: Self-pay | Admitting: Oncology

## 2021-08-04 ENCOUNTER — Ambulatory Visit (HOSPITAL_COMMUNITY)
Admission: RE | Admit: 2021-08-04 | Discharge: 2021-08-04 | Disposition: A | Discharge status: Home / Self Care | Payer: Medicare Other | Source: Ambulatory Visit | Attending: Nurse Practitioner | Admitting: Nurse Practitioner

## 2021-08-04 ENCOUNTER — Inpatient Hospital Stay: Payer: Medicare Other

## 2021-08-04 ENCOUNTER — Ambulatory Visit (HOSPITAL_COMMUNITY)
Admission: RE | Admit: 2021-08-04 | Discharge: 2021-08-04 | Disposition: A | Discharge status: Home / Self Care | Payer: Medicare Other | Source: Ambulatory Visit | Attending: Radiology | Admitting: Radiology

## 2021-08-04 VITALS — BP 140/70

## 2021-08-04 DIAGNOSIS — J90 Pleural effusion, not elsewhere classified: Secondary | ICD-10-CM | POA: Diagnosis not present

## 2021-08-04 DIAGNOSIS — R0600 Dyspnea, unspecified: Secondary | ICD-10-CM | POA: Diagnosis not present

## 2021-08-04 DIAGNOSIS — Z85038 Personal history of other malignant neoplasm of large intestine: Secondary | ICD-10-CM | POA: Diagnosis not present

## 2021-08-04 DIAGNOSIS — R0489 Hemorrhage from other sites in respiratory passages: Secondary | ICD-10-CM | POA: Diagnosis not present

## 2021-08-04 DIAGNOSIS — R091 Pleurisy: Secondary | ICD-10-CM | POA: Diagnosis not present

## 2021-08-04 DIAGNOSIS — C187 Malignant neoplasm of sigmoid colon: Secondary | ICD-10-CM | POA: Insufficient documentation

## 2021-08-04 DIAGNOSIS — Z9889 Other specified postprocedural states: Secondary | ICD-10-CM | POA: Insufficient documentation

## 2021-08-04 DIAGNOSIS — R836 Abnormal cytological findings in cerebrospinal fluid: Secondary | ICD-10-CM | POA: Diagnosis not present

## 2021-08-05 ENCOUNTER — Telehealth: Payer: Self-pay

## 2021-08-05 NOTE — Telephone Encounter (Signed)
Pt's wife called was concerned about chest x-ray showing atelectasis. Discussed with Dr Benay Spice who stated this was small and at the bottom of the lung. Dr Benay Spice asked if Pt was feeling short of breath?  Relayed this message to Pt's wife who stated Pt is ok and not feeling short of breath. Inquired about next Thoracentesis appointment informed Pt's wife we will make an appointment and if Pt does not need appointment we can cancel.

## 2021-08-06 ENCOUNTER — Inpatient Hospital Stay: Payer: Medicare Other

## 2021-08-06 ENCOUNTER — Other Ambulatory Visit: Payer: Self-pay

## 2021-08-06 LAB — CYTOLOGY - NON PAP

## 2021-08-12 ENCOUNTER — Other Ambulatory Visit: Payer: Self-pay

## 2021-08-12 DIAGNOSIS — C187 Malignant neoplasm of sigmoid colon: Secondary | ICD-10-CM

## 2021-08-14 ENCOUNTER — Ambulatory Visit (HOSPITAL_COMMUNITY)
Admission: RE | Admit: 2021-08-14 | Discharge: 2021-08-14 | Disposition: A | Discharge status: Home / Self Care | Payer: Medicare Other | Source: Ambulatory Visit | Attending: Oncology | Admitting: Oncology

## 2021-08-14 ENCOUNTER — Ambulatory Visit (HOSPITAL_COMMUNITY)
Admission: RE | Admit: 2021-08-14 | Discharge: 2021-08-14 | Disposition: A | Discharge status: Home / Self Care | Payer: Medicare Other | Source: Ambulatory Visit | Attending: Student | Admitting: Student

## 2021-08-14 DIAGNOSIS — C187 Malignant neoplasm of sigmoid colon: Secondary | ICD-10-CM | POA: Diagnosis not present

## 2021-08-14 DIAGNOSIS — C801 Malignant (primary) neoplasm, unspecified: Secondary | ICD-10-CM | POA: Diagnosis not present

## 2021-08-14 DIAGNOSIS — J90 Pleural effusion, not elsewhere classified: Secondary | ICD-10-CM | POA: Insufficient documentation

## 2021-08-14 DIAGNOSIS — Z85038 Personal history of other malignant neoplasm of large intestine: Secondary | ICD-10-CM | POA: Diagnosis not present

## 2021-08-14 DIAGNOSIS — R918 Other nonspecific abnormal finding of lung field: Secondary | ICD-10-CM | POA: Diagnosis not present

## 2021-08-14 DIAGNOSIS — J910 Malignant pleural effusion: Secondary | ICD-10-CM | POA: Diagnosis not present

## 2021-08-14 HISTORY — PX: IR THORACENTESIS ASP PLEURAL SPACE W/IMG GUIDE: IMG5380

## 2021-08-14 MED ORDER — LIDOCAINE HCL 1 % IJ SOLN
INTRAMUSCULAR | Status: AC
  Filled 2021-08-14: qty 20

## 2021-08-14 MED ORDER — LIDOCAINE HCL (PF) 1 % IJ SOLN
INTRAMUSCULAR | Status: DC | PRN
  Administered 2021-08-14: 5 mL

## 2021-08-14 NOTE — Procedures (Signed)
PROCEDURE SUMMARY:  Successful US guided left thoracentesis. Yielded 1.2 L of amber-colored fluid. Pt tolerated procedure well. No immediate complications.  Specimen sent for labs. CXR ordered.  EBL < 2 mL  Theresa Duty, NP 08/14/2021 10:37 AM

## 2021-08-15 ENCOUNTER — Other Ambulatory Visit: Payer: Self-pay | Admitting: Oncology

## 2021-08-17 NOTE — Progress Notes (Signed)
Pharmacist Chemotherapy Monitoring - Initial Assessment    Anticipated start date: 08/18/21   The following has been reviewed per standard work regarding the patient's treatment regimen: The patient's diagnosis, treatment plan and drug doses, and organ/hematologic function Lab orders and baseline tests specific to treatment regimen  The treatment plan start date, drug sequencing, and pre-medications Prior authorization status  Patient's documented medication list, including drug-drug interaction screen and prescriptions for anti-emetics and supportive care specific to the treatment regimen The drug concentrations, fluid compatibility, administration routes, and timing of the medications to be used The patient's access for treatment and lifetime cumulative dose history, if applicable  The patient's medication allergies and previous infusion related reactions, if applicable   Changes made to treatment plan:  N/A  Follow up needed:  N/A   Patrica Duel, [facility], 08/17/2021  4:24 PM

## 2021-08-18 ENCOUNTER — Inpatient Hospital Stay: Payer: Medicare Other

## 2021-08-18 ENCOUNTER — Encounter: Payer: Self-pay | Admitting: *Deleted

## 2021-08-18 ENCOUNTER — Inpatient Hospital Stay: Payer: Medicare Other | Attending: Oncology | Admitting: Nurse Practitioner

## 2021-08-18 ENCOUNTER — Telehealth: Payer: Self-pay | Admitting: Nurse Practitioner

## 2021-08-18 ENCOUNTER — Encounter: Payer: Self-pay | Admitting: Nurse Practitioner

## 2021-08-18 VITALS — BP 146/69 | HR 73 | Temp 98.1°F | Resp 18 | Ht 68.0 in | Wt 193.4 lb

## 2021-08-18 DIAGNOSIS — Z95828 Presence of other vascular implants and grafts: Secondary | ICD-10-CM

## 2021-08-18 DIAGNOSIS — Z5112 Encounter for antineoplastic immunotherapy: Secondary | ICD-10-CM | POA: Insufficient documentation

## 2021-08-18 DIAGNOSIS — C187 Malignant neoplasm of sigmoid colon: Secondary | ICD-10-CM

## 2021-08-18 DIAGNOSIS — Z5111 Encounter for antineoplastic chemotherapy: Secondary | ICD-10-CM | POA: Diagnosis not present

## 2021-08-18 DIAGNOSIS — Z452 Encounter for adjustment and management of vascular access device: Secondary | ICD-10-CM | POA: Diagnosis not present

## 2021-08-18 DIAGNOSIS — R970 Elevated carcinoembryonic antigen [CEA]: Secondary | ICD-10-CM | POA: Insufficient documentation

## 2021-08-18 DIAGNOSIS — C7801 Secondary malignant neoplasm of right lung: Secondary | ICD-10-CM | POA: Insufficient documentation

## 2021-08-18 DIAGNOSIS — C7802 Secondary malignant neoplasm of left lung: Secondary | ICD-10-CM | POA: Insufficient documentation

## 2021-08-18 DIAGNOSIS — J90 Pleural effusion, not elsewhere classified: Secondary | ICD-10-CM | POA: Diagnosis not present

## 2021-08-18 LAB — CMP (CANCER CENTER ONLY)
ALT: 13 U/L (ref 0–44)
AST: 16 U/L (ref 15–41)
Albumin: 4.1 g/dL (ref 3.5–5.0)
Alkaline Phosphatase: 71 U/L (ref 38–126)
Anion gap: 9 (ref 5–15)
BUN: 15 mg/dL (ref 8–23)
CO2: 31 mmol/L (ref 22–32)
Calcium: 10 mg/dL (ref 8.9–10.3)
Chloride: 101 mmol/L (ref 98–111)
Creatinine: 0.9 mg/dL (ref 0.61–1.24)
GFR, Estimated: >60 mL/min (ref >60)
Glucose, Bld: 123 mg/dL — ABNORMAL HIGH (ref 70–99)
Potassium: 5.3 mmol/L — ABNORMAL HIGH (ref 3.5–5.1)
Sodium: 141 mmol/L (ref 135–145)
Total Bilirubin: 0.5 mg/dL (ref 0.3–1.2)
Total Protein: 8 g/dL (ref 6.5–8.1)

## 2021-08-18 LAB — CBC WITH DIFFERENTIAL (CANCER CENTER ONLY)
Abs Immature Granulocytes: 0.04 10*3/uL (ref 0.00–0.07)
Basophils Absolute: 0.1 10*3/uL (ref 0.0–0.1)
Basophils Relative: 1 %
Eosinophils Absolute: 0.4 10*3/uL (ref 0.0–0.5)
Eosinophils Relative: 3 %
HCT: 47.9 % (ref 39.0–52.0)
Hemoglobin: 16.1 g/dL (ref 13.0–17.0)
Immature Granulocytes: 0 %
Lymphocytes Relative: 19 %
Lymphs Abs: 1.9 10*3/uL (ref 0.7–4.0)
MCH: 31.7 pg (ref 26.0–34.0)
MCHC: 33.6 g/dL (ref 30.0–36.0)
MCV: 94.3 fL (ref 80.0–100.0)
Monocytes Absolute: 0.7 10*3/uL (ref 0.1–1.0)
Monocytes Relative: 6 %
Neutro Abs: 7.3 10*3/uL (ref 1.7–7.7)
Neutrophils Relative %: 71 %
Platelet Count: 406 10*3/uL — ABNORMAL HIGH (ref 150–400)
RBC: 5.08 MIL/uL (ref 4.22–5.81)
RDW: 13.5 % (ref 11.5–15.5)
WBC Count: 10.4 10*3/uL (ref 4.0–10.5)
nRBC: 0 % (ref 0.0–0.2)

## 2021-08-18 LAB — CYTOLOGY - NON PAP

## 2021-08-18 LAB — CEA (ACCESS): CEA (CHCC): 20.09 ng/mL — ABNORMAL HIGH (ref 0.00–5.00)

## 2021-08-18 MED ORDER — HEPARIN SOD (PORK) LOCK FLUSH 100 UNIT/ML IV SOLN
500.0000 [IU] | Freq: Once | INTRAVENOUS | Status: AC
  Administered 2021-08-18: 500 [IU] via INTRAVENOUS

## 2021-08-18 MED ORDER — SODIUM CHLORIDE 0.9% FLUSH
10.0000 mL | INTRAVENOUS | Status: AC | PRN
  Administered 2021-08-18: 10 mL via INTRAVENOUS

## 2021-08-18 NOTE — Progress Notes (Signed)
Patient seen by Ned Card NP today  Vitals are within treatment parameters.  Labs reviewed by Ned Card NP and are not all within treatment parameters. K+ 5.3 today Unable to obtain definitive diagnosis. Will re-scan and tap pleural fluid on 08/21/21 Per physician team, patient will not be receiving treatment today.

## 2021-08-18 NOTE — Addendum Note (Signed)
Addended by: Velna Hatchet on: 08/18/2021 10:26 AM   Modules accepted: Orders

## 2021-08-18 NOTE — Progress Notes (Signed)
[place] OFFICE PROGRESS NOTE   Diagnosis: Colon cancer  INTERVAL HISTORY:   Craig Best returns as scheduled.  He underwent another thoracentesis procedure on 08/14/2021 with 1.2 L of pleural fluid removed.  Cytology is pending.  He notes improvement in breathing after a thoracentesis procedure.  He feels the fluid may be reaccumulating.  He overall feels well.  Good appetite.  Objective:  Vital signs in last 24 hours:  Blood pressure (!) 146/69, pulse 73, temperature 98.1 F (36.7 C), temperature source Oral, resp. rate 18, height '5\' 8"'  (1.727 m), weight 193 lb 6.4 oz (87.7 kg), SpO2 98 %.    Resp: Breath sounds are diminished left lower one half.  No respiratory distress. Cardio: Regular rate and rhythm. GI: No hepatosplenomegaly. Vascular: No leg edema. Port-A-Cath without erythema.  At the lateral edge of the scar just inferior to the scar there is a small pustule.  Lab Results:  Lab Results  Component Value Date   WBC 10.4 08/18/2021   HGB 16.1 08/18/2021   HCT 47.9 08/18/2021   MCV 94.3 08/18/2021   PLT 406 (H) 08/18/2021   NEUTROABS 7.3 08/18/2021    Imaging:  No results found.  Medications: I have reviewed the patient's current medications.  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), status post a sigmoid colectomy and partial bladder resection 09/19/2020 2 separate sigmoid colon tumors, 2.5 cm apart, 1/13 lymph nodes positive, tumor extends to adherent soft tissue of the bladder, MSS, normal mismatch repair protein expression tumor mutation burden 0, K-ras G12D CT abdomen/pelvis 09/19/2020-sigmoid colon mass with colonic obstruction, 6 mm right lower lobe nodule CT chest 09/22/2020-multiple bilateral lung nodules consistent with metastases Elevated preoperative CEA Cycle 1 FOLFOX 10/28/2020 Cycle 2 FOLFOX 11/11/2020 Cycle 3 FOLFOX 11/25/2020 Cycle 4 FOLFOX 12/09/2020 Cycle 5 FOLFOX 12/23/2020 CT chest 12/25/2020-unchanged  lung nodules, 2 cm subpleural nodule in the left posterior chest more prominent-loculated fluid? Cycle 6 FOLFOX 02/03/2021-5-FU dose reduced and bolus eliminated secondary to diarrhea Cycle 7 FOLFOX 02/17/2021 Cycle 8 FOLFOX 03/03/2021 PET 03/17/2021 pulmonary nodules without significant change, 10 mm nodule in right middle lobe with mild FDG uptake, mildly hypermetabolic pleural nodule in the lower left hemothorax, new clustered nodularity in the apical and posterior right upper lobe-likely infectious CTs 07/23/2021-new left pleural effusion with multiple pleural nodules, increased size of several pulmonary nodules, stable left paracolic gutter nodule, morphologic features suggestive of early cirrhosis Thoracentesis 07/24/2021-1200 cc of fluid removed, rare atypical cells present Thoracentesis 627 2023-1.5 L of fluid removed, atypical cells present, acute inflammation and blood Thoracentesis 08/15/2018 23-1.2 L of fluid removed, cytology pending   2.  Port-A-Cath placement-Dr. 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  He had a third thoracentesis procedure 08/14/2021.  Cytology is pending.  CEA from today is higher.  Craig Best understands the likelihood the lung nodules and pleural fluid are related to colon cancer.  We will follow-up on the cytology from today.  If the cytology is nondiagnostic the plan is to proceed with a thoracentesis and noncontrast CT scan of the chest 08/21/2021.  He agrees with this plan.  Chemotherapy remains on hold pending biopsy confirmation of malignancy.  He will return for lab, follow-up, possible FOLFIRI/Avastin in 2 weeks.  We are available to see him sooner if needed.  Patient seen with Dr. Benay Spice.   Ned Card ANP/GNP-BC   08/18/2021  9:21 AM  This was a shared visit with Ned Card.  We discussed the cytology findings and  treatment options with Craig Best.  We will follow-up on the most recent cytology from 08/14/2021.  If positive for malignancy the plan is to begin FOLFIRI chemotherapy.  We will obtain a repeat chest CT to look for a biopsy site if the cytology is again negative.  We reviewed the most recent CT images with him today.  He will be referred for a palliative thoracentesis this week.  I was present for greater than 50% of today's visit.  I performed medical decision making.  Julieanne Manson, MD

## 2021-08-18 NOTE — Telephone Encounter (Signed)
I called Craig Best to review the cytology report on the pleural fluid from 08/14/2021.  Malignant cells are present in the fluid.  He agrees to proceed with chemotherapy as scheduled 09/01/2021.  He is scheduled for a thoracentesis and chest CT 08/21/2021 and would like to proceed as we had previously discussed.

## 2021-08-20 ENCOUNTER — Inpatient Hospital Stay: Payer: Medicare Other

## 2021-08-21 ENCOUNTER — Ambulatory Visit (HOSPITAL_COMMUNITY)
Admission: RE | Admit: 2021-08-21 | Discharge: 2021-08-21 | Disposition: A | Discharge status: Home / Self Care | Payer: Medicare Other | Source: Ambulatory Visit | Attending: Nurse Practitioner | Admitting: Nurse Practitioner

## 2021-08-21 DIAGNOSIS — I700 Atherosclerosis of aorta: Secondary | ICD-10-CM | POA: Diagnosis not present

## 2021-08-21 DIAGNOSIS — C187 Malignant neoplasm of sigmoid colon: Secondary | ICD-10-CM | POA: Diagnosis not present

## 2021-08-21 DIAGNOSIS — C801 Malignant (primary) neoplasm, unspecified: Secondary | ICD-10-CM | POA: Diagnosis not present

## 2021-08-21 DIAGNOSIS — J90 Pleural effusion, not elsewhere classified: Secondary | ICD-10-CM | POA: Insufficient documentation

## 2021-08-21 DIAGNOSIS — R918 Other nonspecific abnormal finding of lung field: Secondary | ICD-10-CM | POA: Diagnosis not present

## 2021-08-21 DIAGNOSIS — J910 Malignant pleural effusion: Secondary | ICD-10-CM | POA: Diagnosis not present

## 2021-08-21 DIAGNOSIS — R911 Solitary pulmonary nodule: Secondary | ICD-10-CM | POA: Diagnosis not present

## 2021-08-21 HISTORY — PX: IR THORACENTESIS ASP PLEURAL SPACE W/IMG GUIDE: IMG5380

## 2021-08-21 MED ORDER — LIDOCAINE HCL 1 % IJ SOLN
INTRAMUSCULAR | Status: AC
  Filled 2021-08-21: qty 20

## 2021-08-21 NOTE — Procedures (Signed)
PROCEDURE SUMMARY:  Successful US guided Left thoracentesis. Yielded 1L of red fluid. Pt tolerated procedure well. No immediate complications.  Specimen was sent for labs. Previously ordered CT chest to follow in lieu of typical CXR.  EBL < 5 mL  Tylicia Sherman PA-C 08/21/2021 11:18 AM

## 2021-08-24 LAB — CYTOLOGY - NON PAP

## 2021-08-28 ENCOUNTER — Telehealth: Payer: Self-pay

## 2021-08-28 NOTE — Telephone Encounter (Signed)
TC from Pt's significant other Craig Best.) Inquiring if Pt should go for another thoracentesis. She states Pt is feeling short of breath. And is starting treatment on Tuesday. Discussed with Dr Benay Spice. If Pt has difficulty breathing he should go to the ER. Informed Pt's significant other and he is scheduled for his treatment on Tuesday. Pt's significant other and Pt verbalized understanding.

## 2021-08-31 ENCOUNTER — Other Ambulatory Visit: Payer: Self-pay

## 2021-09-01 ENCOUNTER — Inpatient Hospital Stay: Payer: Medicare Other

## 2021-09-01 ENCOUNTER — Other Ambulatory Visit: Payer: Self-pay | Admitting: *Deleted

## 2021-09-01 ENCOUNTER — Ambulatory Visit (HOSPITAL_BASED_OUTPATIENT_CLINIC_OR_DEPARTMENT_OTHER)
Admission: RE | Admit: 2021-09-01 | Discharge: 2021-09-01 | Disposition: A | Discharge status: Home / Self Care | Payer: Medicare Other | Source: Ambulatory Visit | Attending: Oncology | Admitting: Oncology

## 2021-09-01 ENCOUNTER — Inpatient Hospital Stay: Payer: Medicare Other | Admitting: Oncology

## 2021-09-01 ENCOUNTER — Encounter: Payer: Self-pay | Admitting: Oncology

## 2021-09-01 ENCOUNTER — Other Ambulatory Visit: Payer: Self-pay

## 2021-09-01 ENCOUNTER — Encounter: Payer: Self-pay | Admitting: *Deleted

## 2021-09-01 VITALS — BP 148/67 | HR 72 | Temp 97.9°F | Resp 18 | Ht 68.0 in | Wt 192.0 lb

## 2021-09-01 DIAGNOSIS — C187 Malignant neoplasm of sigmoid colon: Secondary | ICD-10-CM

## 2021-09-01 DIAGNOSIS — Z5111 Encounter for antineoplastic chemotherapy: Secondary | ICD-10-CM | POA: Diagnosis not present

## 2021-09-01 DIAGNOSIS — C7802 Secondary malignant neoplasm of left lung: Secondary | ICD-10-CM | POA: Diagnosis not present

## 2021-09-01 DIAGNOSIS — J90 Pleural effusion, not elsewhere classified: Secondary | ICD-10-CM | POA: Diagnosis not present

## 2021-09-01 DIAGNOSIS — C7801 Secondary malignant neoplasm of right lung: Secondary | ICD-10-CM | POA: Diagnosis not present

## 2021-09-01 DIAGNOSIS — R970 Elevated carcinoembryonic antigen [CEA]: Secondary | ICD-10-CM | POA: Diagnosis not present

## 2021-09-01 DIAGNOSIS — Z452 Encounter for adjustment and management of vascular access device: Secondary | ICD-10-CM | POA: Diagnosis not present

## 2021-09-01 DIAGNOSIS — Z5112 Encounter for antineoplastic immunotherapy: Secondary | ICD-10-CM | POA: Diagnosis not present

## 2021-09-01 LAB — CBC WITH DIFFERENTIAL (CANCER CENTER ONLY)
Abs Immature Granulocytes: 0.03 10*3/uL (ref 0.00–0.07)
Basophils Absolute: 0.1 10*3/uL (ref 0.0–0.1)
Basophils Relative: 1 %
Eosinophils Absolute: 0.3 10*3/uL (ref 0.0–0.5)
Eosinophils Relative: 3 %
HCT: 44.2 % (ref 39.0–52.0)
Hemoglobin: 15.2 g/dL (ref 13.0–17.0)
Immature Granulocytes: 0 %
Lymphocytes Relative: 18 %
Lymphs Abs: 1.9 10*3/uL (ref 0.7–4.0)
MCH: 31.8 pg (ref 26.0–34.0)
MCHC: 34.4 g/dL (ref 30.0–36.0)
MCV: 92.5 fL (ref 80.0–100.0)
Monocytes Absolute: 0.7 10*3/uL (ref 0.1–1.0)
Monocytes Relative: 7 %
Neutro Abs: 7.3 10*3/uL (ref 1.7–7.7)
Neutrophils Relative %: 71 %
Platelet Count: 333 10*3/uL (ref 150–400)
RBC: 4.78 MIL/uL (ref 4.22–5.81)
RDW: 13.7 % (ref 11.5–15.5)
WBC Count: 10.3 10*3/uL (ref 4.0–10.5)
nRBC: 0 % (ref 0.0–0.2)

## 2021-09-01 LAB — CMP (CANCER CENTER ONLY)
ALT: 13 U/L (ref 0–44)
AST: 16 U/L (ref 15–41)
Albumin: 4 g/dL (ref 3.5–5.0)
Alkaline Phosphatase: 74 U/L (ref 38–126)
Anion gap: 11 (ref 5–15)
BUN: 14 mg/dL (ref 8–23)
CO2: 28 mmol/L (ref 22–32)
Calcium: 9.9 mg/dL (ref 8.9–10.3)
Chloride: 99 mmol/L (ref 98–111)
Creatinine: 0.77 mg/dL (ref 0.61–1.24)
GFR, Estimated: >60 mL/min (ref >60)
Glucose, Bld: 109 mg/dL — ABNORMAL HIGH (ref 70–99)
Potassium: 4.1 mmol/L (ref 3.5–5.1)
Sodium: 138 mmol/L (ref 135–145)
Total Bilirubin: 0.5 mg/dL (ref 0.3–1.2)
Total Protein: 7.5 g/dL (ref 6.5–8.1)

## 2021-09-01 LAB — CEA (ACCESS): CEA (CHCC): 33.22 ng/mL — ABNORMAL HIGH (ref 0.00–5.00)

## 2021-09-01 MED ORDER — FLUOROURACIL CHEMO INJECTION 2.5 GM/50ML
300.0000 mg/m2 | Freq: Once | INTRAVENOUS | Status: AC
  Administered 2021-09-01: 600 mg via INTRAVENOUS
  Filled 2021-09-01: qty 12

## 2021-09-01 MED ORDER — ATROPINE SULFATE 1 MG/ML IV SOLN
0.5000 mg | Freq: Once | INTRAVENOUS | Status: AC | PRN
  Administered 2021-09-01: 0.5 mg via INTRAVENOUS
  Filled 2021-09-01: qty 1

## 2021-09-01 MED ORDER — SODIUM CHLORIDE 0.9 % IV SOLN
10.0000 mg | Freq: Once | INTRAVENOUS | Status: AC
  Administered 2021-09-01: 10 mg via INTRAVENOUS
  Filled 2021-09-01: qty 1

## 2021-09-01 MED ORDER — SODIUM CHLORIDE 0.9 % IV SOLN
300.0000 mg/m2 | Freq: Once | INTRAVENOUS | Status: AC
  Administered 2021-09-01: 618 mg via INTRAVENOUS
  Filled 2021-09-01: qty 30.9

## 2021-09-01 MED ORDER — PROCHLORPERAZINE MALEATE 10 MG PO TABS: 10.0000 mg | ORAL_TABLET | Freq: Four times a day (QID) | ORAL | 0 refills | Status: DC | PRN

## 2021-09-01 MED ORDER — PALONOSETRON HCL INJECTION 0.25 MG/5ML
0.2500 mg | Freq: Once | INTRAVENOUS | Status: AC
  Administered 2021-09-01: 0.25 mg via INTRAVENOUS
  Filled 2021-09-01: qty 5

## 2021-09-01 MED ORDER — SODIUM CHLORIDE 0.9 % IV SOLN
180.0000 mg/m2 | Freq: Once | INTRAVENOUS | Status: AC
  Administered 2021-09-01: 380 mg via INTRAVENOUS
  Filled 2021-09-01: qty 15

## 2021-09-01 MED ORDER — SODIUM CHLORIDE 0.9 % IV SOLN
2000.0000 mg/m2 | INTRAVENOUS | Status: DC
  Administered 2021-09-01: 4100 mg via INTRAVENOUS
  Filled 2021-09-01: qty 82

## 2021-09-01 MED ORDER — SODIUM CHLORIDE 0.9 % IV SOLN: Freq: Once | INTRAVENOUS | Status: AC

## 2021-09-01 MED ORDER — SODIUM CHLORIDE 0.9 % IV SOLN
5.0000 mg/kg | Freq: Once | INTRAVENOUS | Status: AC
  Administered 2021-09-01: 450 mg via INTRAVENOUS
  Filled 2021-09-01: qty 16

## 2021-09-01 NOTE — Patient Instructions (Signed)
[facility]   Discharge Instructions: Thank you for choosing [facility] to provide your oncology and hematology care.   If you have a lab appointment with the [facility], please go directly to the [facility] and check in at the registration area.   Wear comfortable clothing and clothing appropriate for easy access to any Portacath or PICC line.   We strive to give you quality time with your provider. You may need to reschedule your appointment if you arrive late (15 or more minutes).  Arriving late affects you and other patients whose appointments are after yours.  Also, if you miss three or more appointments without notifying the office, you may be dismissed from the clinic at the provider's discretion.      For prescription refill requests, have your pharmacy contact our office and allow 72 hours for refills to be completed.    Today you received the following chemotherapy and/or immunotherapy agents Bevacizumab-bvzr (ZIRABEV), Irinotecan (CAMPTOSAR), Leucovorin & Flourouracil (ADRUCIL).      To help prevent nausea and vomiting after your treatment, we encourage you to take your nausea medication as directed.  BELOW ARE SYMPTOMS THAT SHOULD BE REPORTED IMMEDIATELY: *FEVER GREATER THAN 100.4 F (38 C) OR HIGHER *CHILLS OR SWEATING *NAUSEA AND VOMITING THAT IS NOT CONTROLLED WITH YOUR NAUSEA MEDICATION *UNUSUAL SHORTNESS OF BREATH *UNUSUAL BRUISING OR BLEEDING *URINARY PROBLEMS (pain or burning when urinating, or frequent urination) *BOWEL PROBLEMS (unusual diarrhea, constipation, pain near the anus) TENDERNESS IN MOUTH AND THROAT WITH OR WITHOUT PRESENCE OF ULCERS (sore throat, sores in mouth, or a toothache) UNUSUAL RASH, SWELLING OR PAIN  UNUSUAL VAGINAL DISCHARGE OR ITCHING   Items with * indicate a potential emergency and should be followed up as soon as possible or go to the Emergency Department if any problems should occur.  Please  show the CHEMOTHERAPY ALERT CARD or IMMUNOTHERAPY ALERT CARD at check-in to the Emergency Department and triage nurse.  Should you have questions after your visit or need to cancel or reschedule your appointment, please contact [facility]  Dept: [phone]  and follow the prompts.  Office hours are 8:00 a.m. to 4:30 p.m. Monday - Friday. Please note that voicemails left after 4:00 p.m. may not be returned until the following business day.  We are closed weekends and major holidays. You have access to a nurse at all times for urgent questions. Please call the main number to the clinic Dept: [phone] and follow the prompts.   For any non-urgent questions, you may also contact your provider using MyChart. We now offer e-Visits for anyone 71 and older to request care online for non-urgent symptoms. For details visit [email or web address].   Also download the MyChart app! Go to the app store, search "MyChart", open the app, select [facility], and log in with your MyChart username and password.  Masks are optional in the cancer centers. If you would like for your care team to wear a mask while they are taking care of you, please let them know. For doctor visits, patients may have with them one support person who is at least 79 years old. At this time, visitors are not allowed in the infusion area.  Bevacizumab injection What is this medication? BEVACIZUMAB (be va SIZ yoo mab) is a monoclonal antibody. It is used to treat many types of cancer. This medicine may be used for other purposes; ask your health care provider or pharmacist if you  have questions. COMMON BRAND NAME(S): Alymsys, Avastin, MVASI, Noah Charon What should I tell my care team before I take this medication? They need to know if you have any of these conditions: diabetes heart disease high blood pressure history of coughing up blood prior anthracycline chemotherapy (e.g., doxorubicin, daunorubicin,  epirubicin) recent or ongoing radiation therapy recent or planning to have surgery stroke an unusual or allergic reaction to bevacizumab, hamster proteins, mouse proteins, other medicines, foods, dyes, or preservatives pregnant or trying to get pregnant breast-feeding How should I use this medication? This medicine is for infusion into a vein. It is given by a health care professional in a hospital or clinic setting. Talk to your pediatrician regarding the use of this medicine in children. Special care may be needed. Overdosage: If you think you have taken too much of this medicine contact a poison control center or emergency room at once. NOTE: This medicine is only for you. Do not share this medicine with others. What if I miss a dose? It is important not to miss your dose. Call your doctor or health care professional if you are unable to keep an appointment. What may interact with this medication? Interactions are not expected. This list may not describe all possible interactions. Give your health care provider a list of all the medicines, herbs, non-prescription drugs, or dietary supplements you use. Also tell them if you smoke, drink alcohol, or use illegal drugs. Some items may interact with your medicine. What should I watch for while using this medication? Your condition will be monitored carefully while you are receiving this medicine. You will need important blood work and urine testing done while you are taking this medicine. This medicine may increase your risk to bruise or bleed. Call your doctor or health care professional if you notice any unusual bleeding. Before having surgery, talk to your health care provider to make sure it is ok. This drug can increase the risk of poor healing of your surgical site or wound. You will need to stop this drug for 28 days before surgery. After surgery, wait at least 28 days before restarting this drug. Make sure the surgical site or wound is  healed enough before restarting this drug. Talk to your health care provider if questions. Do not become pregnant while taking this medicine or for 6 months after stopping it. Women should inform their doctor if they wish to become pregnant or think they might be pregnant. There is a potential for serious side effects to an unborn child. Talk to your health care professional or pharmacist for more information. Do not breast-feed an infant while taking this medicine and for 6 months after the last dose. This medicine has caused ovarian failure in some women. This medicine may interfere with the ability to have a child. You should talk to your doctor or health care professional if you are concerned about your fertility. What side effects may I notice from receiving this medication? Side effects that you should report to your doctor or health care professional as soon as possible: allergic reactions like skin rash, itching or hives, swelling of the face, lips, or tongue chest pain or chest tightness chills coughing up blood high fever seizures severe constipation signs and symptoms of bleeding such as bloody or black, tarry stools; red or dark-brown urine; spitting up blood or brown material that looks like coffee grounds; red spots on the skin; unusual bruising or bleeding from the eye, gums, or nose signs and symptoms of  a blood clot such as breathing problems; chest pain; severe, sudden headache; pain, swelling, warmth in the leg signs and symptoms of a stroke like changes in vision; confusion; trouble speaking or understanding; severe headaches; sudden numbness or weakness of the face, arm or leg; trouble walking; dizziness; loss of balance or coordination stomach pain sweating swelling of legs or ankles vomiting weight gain Side effects that usually do not require medical attention (report to your doctor or health care professional if they continue or are bothersome): back pain changes in  taste decreased appetite dry skin nausea tiredness This list may not describe all possible side effects. Call your doctor for medical advice about side effects. You may report side effects to FDA at 1-800-FDA-1088. Where should I keep my medication? This drug is given in a hospital or clinic and will not be stored at home. NOTE: This sheet is a summary. It may not cover all possible information. If you have questions about this medicine, talk to your doctor, pharmacist, or health care provider.  2023 Elsevier/Gold Standard (2020-12-26 00:00:00) Irinotecan injection What is this medication? IRINOTECAN (ir in oh TEE kan ) is a chemotherapy drug. It is used to treat colon and rectal cancer. This medicine may be used for other purposes; ask your health care provider or pharmacist if you have questions. COMMON BRAND NAME(S): Camptosar What should I tell my care team before I take this medication? They need to know if you have any of these conditions: dehydration diarrhea infection (especially a virus infection such as chickenpox, cold sores, or herpes) liver disease low blood counts, like low white cell, platelet, or red cell counts low levels of calcium, magnesium, or potassium in the blood recent or ongoing radiation therapy an unusual or allergic reaction to irinotecan, other medicines, foods, dyes, or preservatives pregnant or trying to get pregnant breast-feeding How should I use this medication? This drug is given as an infusion into a vein. It is administered in a hospital or clinic by a specially trained health care professional. Talk to your pediatrician regarding the use of this medicine in children. Special care may be needed. Overdosage: If you think you have taken too much of this medicine contact a poison control center or emergency room at once. NOTE: This medicine is only for you. Do not share this medicine with others. What if I miss a dose? It is important not to miss  your dose. Call your doctor or health care professional if you are unable to keep an appointment. What may interact with this medication? Do not take this medicine with any of the following medications: cobicistat itraconazole This medicine may interact with the following medications: antiviral medicines for HIV or AIDS certain antibiotics like rifampin or rifabutin certain medicines for fungal infections like ketoconazole, posaconazole, and voriconazole certain medicines for seizures like carbamazepine, phenobarbital, phenotoin clarithromycin gemfibrozil nefazodone St. John's Wort This list may not describe all possible interactions. Give your health care provider a list of all the medicines, herbs, non-prescription drugs, or dietary supplements you use. Also tell them if you smoke, drink alcohol, or use illegal drugs. Some items may interact with your medicine. What should I watch for while using this medication? Your condition will be monitored carefully while you are receiving this medicine. You will need important blood work done while you are taking this medicine. This drug may make you feel generally unwell. This is not uncommon, as chemotherapy can affect healthy cells as well as cancer cells. Report any  side effects. Continue your course of treatment even though you feel ill unless your doctor tells you to stop. In some cases, you may be given additional medicines to help with side effects. Follow all directions for their use. You may get drowsy or dizzy. Do not drive, use machinery, or do anything that needs mental alertness until you know how this medicine affects you. Do not stand or sit up quickly, especially if you are an older patient. This reduces the risk of dizzy or fainting spells. Call your health care professional for advice if you get a fever, chills, or sore throat, or other symptoms of a cold or flu. Do not treat yourself. This medicine decreases your body's ability to  fight infections. Try to avoid being around people who are sick. Avoid taking products that contain aspirin, acetaminophen, ibuprofen, naproxen, or ketoprofen unless instructed by your doctor. These medicines may hide a fever. This medicine may increase your risk to bruise or bleed. Call your doctor or health care professional if you notice any unusual bleeding. Be careful brushing and flossing your teeth or using a toothpick because you may get an infection or bleed more easily. If you have any dental work done, tell your dentist you are receiving this medicine. Do not become pregnant while taking this medicine or for 6 months after stopping it. Women should inform their health care professional if they wish to become pregnant or think they might be pregnant. Men should not father a child while taking this medicine and for 3 months after stopping it. There is potential for serious side effects to an unborn child. Talk to your health care professional for more information. Do not breast-feed an infant while taking this medicine or for 7 days after stopping it. This medicine has caused ovarian failure in some women. This medicine may make it more difficult to get pregnant. Talk to your health care professional if you are concerned about your fertility. This medicine has caused decreased sperm counts in some men. This may make it more difficult to father a child. Talk to your health care professional if you are concerned about your fertility. What side effects may I notice from receiving this medication? Side effects that you should report to your doctor or health care professional as soon as possible: allergic reactions like skin rash, itching or hives, swelling of the face, lips, or tongue chest pain diarrhea flushing, runny nose, sweating during infusion low blood counts - this medicine may decrease the number of white blood cells, red blood cells and platelets. You may be at increased risk for  infections and bleeding. nausea, vomiting pain, swelling, warmth in the leg signs of decreased platelets or bleeding - bruising, pinpoint red spots on the skin, black, tarry stools, blood in the urine signs of infection - fever or chills, cough, sore throat, pain or difficulty passing urine signs of decreased red blood cells - unusually weak or tired, fainting spells, lightheadedness Side effects that usually do not require medical attention (report to your doctor or health care professional if they continue or are bothersome): constipation hair loss headache loss of appetite mouth sores stomach pain This list may not describe all possible side effects. Call your doctor for medical advice about side effects. You may report side effects to FDA at 1-800-FDA-1088. Where should I keep my medication? This drug is given in a hospital or clinic and will not be stored at home. NOTE: This sheet is a summary. It may not cover  all possible information. If you have questions about this medicine, talk to your doctor, pharmacist, or health care provider.  2023 Elsevier/Gold Standard (2020-12-26 00:00:00) Leucovorin injection What is this medication? LEUCOVORIN (loo koe VOR in) is used to prevent or treat the harmful effects of some medicines. This medicine is used to treat anemia caused by a low amount of folic acid in the body. It is also used with 5-fluorouracil (5-FU) to treat colon cancer. This medicine may be used for other purposes; ask your health care provider or pharmacist if you have questions. What should I tell my care team before I take this medication? They need to know if you have any of these conditions: anemia from low levels of vitamin B-12 in the blood an unusual or allergic reaction to leucovorin, folic acid, other medicines, foods, dyes, or preservatives pregnant or trying to get pregnant breast-feeding How should I use this medication? This medicine is for injection into a  muscle or into a vein. It is given by a health care professional in a hospital or clinic setting. Talk to your pediatrician regarding the use of this medicine in children. Special care may be needed. Overdosage: If you think you have taken too much of this medicine contact a poison control center or emergency room at once. NOTE: This medicine is only for you. Do not share this medicine with others. What if I miss a dose? This does not apply. What may interact with this medication? capecitabine fluorouracil phenobarbital phenytoin primidone trimethoprim-sulfamethoxazole This list may not describe all possible interactions. Give your health care provider a list of all the medicines, herbs, non-prescription drugs, or dietary supplements you use. Also tell them if you smoke, drink alcohol, or use illegal drugs. Some items may interact with your medicine. What should I watch for while using this medication? Your condition will be monitored carefully while you are receiving this medicine. This medicine may increase the side effects of 5-fluorouracil, 5-FU. Tell your doctor or health care professional if you have diarrhea or mouth sores that do not get better or that get worse. What side effects may I notice from receiving this medication? Side effects that you should report to your doctor or health care professional as soon as possible: allergic reactions like skin rash, itching or hives, swelling of the face, lips, or tongue breathing problems fever, infection mouth sores unusual bleeding or bruising unusually weak or tired Side effects that usually do not require medical attention (report to your doctor or health care professional if they continue or are bothersome): constipation or diarrhea loss of appetite nausea, vomiting This list may not describe all possible side effects. Call your doctor for medical advice about side effects. You may report side effects to FDA at  1-800-FDA-1088. Where should I keep my medication? This drug is given in a hospital or clinic and will not be stored at home. NOTE: This sheet is a summary. It may not cover all possible information. If you have questions about this medicine, talk to your doctor, pharmacist, or health care provider.  2023 Elsevier/Gold Standard (2007-08-03 00:00:00) Fluorouracil, 5-FU injection What is this medication? FLUOROURACIL, 5-FU (flure oh YOOR a sil) is a chemotherapy drug. It slows the growth of cancer cells. This medicine is used to treat many types of cancer like breast cancer, colon or rectal cancer, pancreatic cancer, and stomach cancer. This medicine may be used for other purposes; ask your health care provider or pharmacist if you have questions. COMMON BRAND NAME(S):  Adrucil What should I tell my care team before I take this medication? They need to know if you have any of these conditions: blood disorders dihydropyrimidine dehydrogenase (DPD) deficiency infection (especially a virus infection such as chickenpox, cold sores, or herpes) kidney disease liver disease malnourished, poor nutrition recent or ongoing radiation therapy an unusual or allergic reaction to fluorouracil, other chemotherapy, other medicines, foods, dyes, or preservatives pregnant or trying to get pregnant breast-feeding How should I use this medication? This drug is given as an infusion or injection into a vein. It is administered in a hospital or clinic by a specially trained health care professional. Talk to your pediatrician regarding the use of this medicine in children. Special care may be needed. Overdosage: If you think you have taken too much of this medicine contact a poison control center or emergency room at once. NOTE: This medicine is only for you. Do not share this medicine with others. What if I miss a dose? It is important not to miss your dose. Call your doctor or health care professional if you are  unable to keep an appointment. What may interact with this medication? Do not take this medicine with any of the following medications: live virus vaccines This medicine may also interact with the following medications: medicines that treat or prevent blood clots like warfarin, enoxaparin, and dalteparin This list may not describe all possible interactions. Give your health care provider a list of all the medicines, herbs, non-prescription drugs, or dietary supplements you use. Also tell them if you smoke, drink alcohol, or use illegal drugs. Some items may interact with your medicine. What should I watch for while using this medication? Visit your doctor for checks on your progress. This drug may make you feel generally unwell. This is not uncommon, as chemotherapy can affect healthy cells as well as cancer cells. Report any side effects. Continue your course of treatment even though you feel ill unless your doctor tells you to stop. In some cases, you may be given additional medicines to help with side effects. Follow all directions for their use. Call your doctor or health care professional for advice if you get a fever, chills or sore throat, or other symptoms of a cold or flu. Do not treat yourself. This drug decreases your body's ability to fight infections. Try to avoid being around people who are sick. This medicine may increase your risk to bruise or bleed. Call your doctor or health care professional if you notice any unusual bleeding. Be careful brushing and flossing your teeth or using a toothpick because you may get an infection or bleed more easily. If you have any dental work done, tell your dentist you are receiving this medicine. Avoid taking products that contain aspirin, acetaminophen, ibuprofen, naproxen, or ketoprofen unless instructed by your doctor. These medicines may hide a fever. Do not become pregnant while taking this medicine. Women should inform their doctor if they wish  to become pregnant or think they might be pregnant. There is a potential for serious side effects to an unborn child. Talk to your health care professional or pharmacist for more information. Do not breast-feed an infant while taking this medicine. Men should inform their doctor if they wish to father a child. This medicine may lower sperm counts. Do not treat diarrhea with over the counter products. Contact your doctor if you have diarrhea that lasts more than 2 days or if it is severe and watery. This medicine can make you more  sensitive to the sun. Keep out of the sun. If you cannot avoid being in the sun, wear protective clothing and use sunscreen. Do not use sun lamps or tanning beds/booths. What side effects may I notice from receiving this medication? Side effects that you should report to your doctor or health care professional as soon as possible: allergic reactions like skin rash, itching or hives, swelling of the face, lips, or tongue low blood counts - this medicine may decrease the number of white blood cells, red blood cells and platelets. You may be at increased risk for infections and bleeding. signs of infection - fever or chills, cough, sore throat, pain or difficulty passing urine signs of decreased platelets or bleeding - bruising, pinpoint red spots on the skin, black, tarry stools, blood in the urine signs of decreased red blood cells - unusually weak or tired, fainting spells, lightheadedness breathing problems changes in vision chest pain mouth sores nausea and vomiting pain, swelling, redness at site where injected pain, tingling, numbness in the hands or feet redness, swelling, or sores on hands or feet stomach pain unusual bleeding Side effects that usually do not require medical attention (report to your doctor or health care professional if they continue or are bothersome): changes in finger or toe nails diarrhea dry or itchy skin hair loss headache loss of  appetite sensitivity of eyes to the light stomach upset unusually teary eyes This list may not describe all possible side effects. Call your doctor for medical advice about side effects. You may report side effects to FDA at 1-800-FDA-1088. Where should I keep my medication? This drug is given in a hospital or clinic and will not be stored at home. NOTE: This sheet is a summary. It may not cover all possible information. If you have questions about this medicine, talk to your doctor, pharmacist, or health care provider.  2023 Elsevier/Gold Standard (2020-12-26 00:00:00) The chemotherapy medication bag should finish at 46 hours, 96 hours, or 7 days. For example, if your pump is scheduled for 46 hours and it was put on at 4:00 p.m., it should finish at 2:00 p.m. the day it is scheduled to come off regardless of your appointment time.     Estimated time to finish at 10:45 a.m. on Thursday 09/03/2021.   If the display on your pump reads "Low Volume" and it is beeping, take the batteries out of the pump and come to the cancer center for it to be taken off.   If the pump alarms go off prior to the pump reading "Low Volume" then call [phone] and someone can assist you.  If the plunger comes out and the chemotherapy medication is leaking out, please use your home chemo spill kit to clean up the spill. Do NOT use paper towels or other household products.  If you have problems or questions regarding your pump, please call either [phone] (24 hours a day) or the cancer center Monday-Friday 8:00 a.m.- 4:30 p.m. at the clinic number and we will assist you. If you are unable to get assistance, then go to the nearest Emergency Department and ask the staff to contact the IV team for assistance.

## 2021-09-01 NOTE — Progress Notes (Signed)
[place] OFFICE PROGRESS NOTE   Diagnosis: Colon cancer  INTERVAL HISTORY:   Mr. Frane returns as scheduled.  He continues to have orthopnea.  He is working.  No other complaint.  He last underwent a thoracentesis for 1 L on 08/21/2021.  The cytology revealed malignant cells consistent with adenocarcinoma.  He is here today to begin FOLFIRI/bevacizumab.  Objective:  Vital signs in last 24 hours:  Blood pressure (!) 148/67, pulse 72, temperature 97.9 F (36.6 C), temperature source Oral, resp. rate 18, height _0  (1.727 m), weight 192 lb (87.1 kg), SpO2 98 %.   Resp: Decreased breath sounds at the left lower chest, no respiratory distress Cardio: Regular rate and rhythm GI: No hepatosplenomegaly, left lower quadrant colostomy Vascular: No leg edema    Portacath/PICC-without erythema  Lab Results:  Lab Results  Component Value Date   WBC 10.3 09/01/2021   HGB 15.2 09/01/2021   HCT 44.2 09/01/2021   MCV 92.5 09/01/2021   PLT 333 09/01/2021   NEUTROABS 7.3 09/01/2021    CMP  Lab Results  Component Value Date   NA 141 08/18/2021   K 5.3 (H) 08/18/2021   CL 101 08/18/2021   CO2 31 08/18/2021   GLUCOSE 123 (H) 08/18/2021   BUN 15 08/18/2021   CREATININE 0.90 08/18/2021   CALCIUM 10.0 08/18/2021   PROT 8.0 08/18/2021   ALBUMIN 4.1 08/18/2021   AST 16 08/18/2021   ALT 13 08/18/2021   ALKPHOS 71 08/18/2021   BILITOT 0.5 08/18/2021   GFRNONAA >60 08/18/2021    Lab Results  Component Value Date   CEA1 63.2 (H) 09/19/2020   CEA 20.09 (H) 08/18/2021    Medications: I have reviewed the patient's current medications.   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), status post a sigmoid colectomy and partial bladder resection 09/19/2020 2 separate sigmoid colon tumors, 2.5 cm apart, 1/13 lymph nodes positive, tumor extends to adherent soft tissue of the bladder, MSS, normal mismatch repair protein expression tumor mutation  burden 0, K-ras G12D CT abdomen/pelvis 09/19/2020-sigmoid colon mass with colonic obstruction, 6 mm right lower lobe nodule CT chest 09/22/2020-multiple bilateral lung nodules consistent with metastases Elevated preoperative CEA Cycle 1 FOLFOX 10/28/2020 Cycle 2 FOLFOX 11/11/2020 Cycle 3 FOLFOX 11/25/2020 Cycle 4 FOLFOX 12/09/2020 Cycle 5 FOLFOX 12/23/2020 CT chest 12/25/2020-unchanged lung nodules, 2 cm subpleural nodule in the left posterior chest more prominent-loculated fluid? Cycle 6 FOLFOX 02/03/2021-5-FU dose reduced and bolus eliminated secondary to diarrhea Cycle 7 FOLFOX 02/17/2021 Cycle 8 FOLFOX 03/03/2021 PET 03/17/2021 pulmonary nodules without significant change, 10 mm nodule in right middle lobe with mild FDG uptake, mildly hypermetabolic pleural nodule in the lower left hemothorax, new clustered nodularity in the apical and posterior right upper lobe-likely infectious CTs 07/23/2021-new left pleural effusion with multiple pleural nodules, increased size of several pulmonary nodules, stable left paracolic gutter nodule, morphologic features suggestive of early cirrhosis Thoracentesis 07/24/2021-1200 cc of fluid removed, rare atypical cells present Thoracentesis 627 2023-1.5 L of fluid removed, atypical cells present, acute inflammation and blood Thoracentesis 08/14/2021- 1.2 L of fluid removed, adenocarcinoma compatible with metastatic colon cancer Thoracentesis 08/21/2021-adenocarcinoma Cycle 1 FOLFIRI/bevacizumab 09/01/2021  2.  Port-A-Cath placement-Dr. 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  Pleural fluid cytology confirmed metastatic disease.  Plan is to begin second line systemic therapy with FOLFIRI/bevacizumab today.  We reviewed  potential toxicities associated with this regimen.  He agrees to proceed.  He continues to have  orthopnea.  He will have a chest x-ray today to follow-up on the left pleural effusion.  He will return for an office visit and cycle 2 FOLFIRI/bevacizumab in 2 weeks.  Betsy Coder, MD  09/01/2021  8:27 AM

## 2021-09-01 NOTE — Progress Notes (Signed)
Pharmacist Chemotherapy Monitoring - Initial Assessment    Anticipated start date: 09/01/21   The following has been reviewed per standard work regarding the patient's treatment regimen: The patient's diagnosis, treatment plan and drug doses, and organ/hematologic function Lab orders and baseline tests specific to treatment regimen  The treatment plan start date, drug sequencing, and pre-medications Prior authorization status  Patient's documented medication list, including drug-drug interaction screen and prescriptions for anti-emetics and supportive care specific to the treatment regimen The drug concentrations, fluid compatibility, administration routes, and timing of the medications to be used The patient's access for treatment and lifetime cumulative dose history, if applicable  The patient's medication allergies and previous infusion related reactions, if applicable   Changes made to treatment plan:  N/A  Follow up needed:  N/A   Craig Best, RPH, 09/01/2021  8:57 AM

## 2021-09-01 NOTE — Progress Notes (Signed)
Patient seen by Dr. Benay Spice today  Vitals are within treatment parameters.  Labs reviewed by Dr. Benay Spice and are within treatment parameters. Per Dr. Benay Spice: Does not need UA protein today Per physician team, patient is ready for treatment and there are NO modifications to the treatment plan.

## 2021-09-02 ENCOUNTER — Telehealth: Payer: Self-pay

## 2021-09-02 ENCOUNTER — Other Ambulatory Visit: Payer: Self-pay

## 2021-09-02 NOTE — Telephone Encounter (Signed)
Pt and Pt's wife verbalized understanding.

## 2021-09-02 NOTE — Telephone Encounter (Signed)
24 Hour Call Back  Telephone call to patient post First Time Bevacizumab/Folfiri. Patient reports feeling good with no concerns. He denies any nausea or vomiting. Verbalized having a great appetite "eating like a pig". He also denies any fatigue. He knows to call the clinic with any concerns and will be back tomorrow for his pump stop appointment.

## 2021-09-02 NOTE — Telephone Encounter (Signed)
-----   Message from Ladell Pier, MD sent at 09/02/2021 11:53 AM EDT ----- Please call patient, the chest x-ray shows a stable small left effusion, unlikely he would benefit from another thoracentesis, follow-up as scheduled

## 2021-09-03 ENCOUNTER — Inpatient Hospital Stay: Payer: Medicare Other

## 2021-09-03 VITALS — BP 142/78 | HR 78 | Temp 98.3°F | Resp 18

## 2021-09-03 DIAGNOSIS — C187 Malignant neoplasm of sigmoid colon: Secondary | ICD-10-CM | POA: Diagnosis not present

## 2021-09-03 DIAGNOSIS — Z5111 Encounter for antineoplastic chemotherapy: Secondary | ICD-10-CM | POA: Diagnosis not present

## 2021-09-03 DIAGNOSIS — Z5112 Encounter for antineoplastic immunotherapy: Secondary | ICD-10-CM | POA: Diagnosis not present

## 2021-09-03 DIAGNOSIS — R970 Elevated carcinoembryonic antigen [CEA]: Secondary | ICD-10-CM | POA: Diagnosis not present

## 2021-09-03 DIAGNOSIS — J90 Pleural effusion, not elsewhere classified: Secondary | ICD-10-CM | POA: Diagnosis not present

## 2021-09-03 DIAGNOSIS — Z452 Encounter for adjustment and management of vascular access device: Secondary | ICD-10-CM | POA: Diagnosis not present

## 2021-09-03 DIAGNOSIS — C7801 Secondary malignant neoplasm of right lung: Secondary | ICD-10-CM | POA: Diagnosis not present

## 2021-09-03 DIAGNOSIS — C7802 Secondary malignant neoplasm of left lung: Secondary | ICD-10-CM | POA: Diagnosis not present

## 2021-09-03 MED ORDER — SODIUM CHLORIDE 0.9% FLUSH: 10.0000 mL | INTRAVENOUS | Status: DC | PRN

## 2021-09-03 MED ORDER — HEPARIN SOD (PORK) LOCK FLUSH 100 UNIT/ML IV SOLN
500.0000 [IU] | Freq: Once | INTRAVENOUS | Status: AC | PRN
  Administered 2021-09-03: 500 [IU]

## 2021-09-03 NOTE — Patient Instructions (Signed)

## 2021-09-08 ENCOUNTER — Other Ambulatory Visit: Payer: Self-pay

## 2021-09-13 ENCOUNTER — Other Ambulatory Visit: Payer: Self-pay | Admitting: Oncology

## 2021-09-15 ENCOUNTER — Inpatient Hospital Stay: Payer: Medicare Other | Admitting: Nurse Practitioner

## 2021-09-15 ENCOUNTER — Inpatient Hospital Stay: Payer: Medicare Other | Attending: Oncology

## 2021-09-15 ENCOUNTER — Inpatient Hospital Stay: Payer: Medicare Other

## 2021-09-15 ENCOUNTER — Encounter: Payer: Self-pay | Admitting: Nurse Practitioner

## 2021-09-15 ENCOUNTER — Encounter: Payer: Self-pay | Admitting: *Deleted

## 2021-09-15 VITALS — BP 141/76 | HR 59 | Resp 18

## 2021-09-15 VITALS — BP 139/71 | HR 64 | Temp 98.1°F | Resp 18 | Ht 68.0 in | Wt 192.0 lb

## 2021-09-15 DIAGNOSIS — C7801 Secondary malignant neoplasm of right lung: Secondary | ICD-10-CM | POA: Insufficient documentation

## 2021-09-15 DIAGNOSIS — C7802 Secondary malignant neoplasm of left lung: Secondary | ICD-10-CM | POA: Diagnosis not present

## 2021-09-15 DIAGNOSIS — C187 Malignant neoplasm of sigmoid colon: Secondary | ICD-10-CM | POA: Insufficient documentation

## 2021-09-15 DIAGNOSIS — Z5112 Encounter for antineoplastic immunotherapy: Secondary | ICD-10-CM | POA: Insufficient documentation

## 2021-09-15 DIAGNOSIS — Z5111 Encounter for antineoplastic chemotherapy: Secondary | ICD-10-CM | POA: Diagnosis not present

## 2021-09-15 DIAGNOSIS — Z452 Encounter for adjustment and management of vascular access device: Secondary | ICD-10-CM | POA: Diagnosis not present

## 2021-09-15 LAB — CBC WITH DIFFERENTIAL (CANCER CENTER ONLY)
Abs Immature Granulocytes: 0.01 10*3/uL (ref 0.00–0.07)
Basophils Absolute: 0.1 10*3/uL (ref 0.0–0.1)
Basophils Relative: 2 %
Eosinophils Absolute: 0.3 10*3/uL (ref 0.0–0.5)
Eosinophils Relative: 5 %
HCT: 41.4 % (ref 39.0–52.0)
Hemoglobin: 14.3 g/dL (ref 13.0–17.0)
Immature Granulocytes: 0 %
Lymphocytes Relative: 29 %
Lymphs Abs: 1.8 10*3/uL (ref 0.7–4.0)
MCH: 32.1 pg (ref 26.0–34.0)
MCHC: 34.5 g/dL (ref 30.0–36.0)
MCV: 92.8 fL (ref 80.0–100.0)
Monocytes Absolute: 0.5 10*3/uL (ref 0.1–1.0)
Monocytes Relative: 8 %
Neutro Abs: 3.5 10*3/uL (ref 1.7–7.7)
Neutrophils Relative %: 56 %
Platelet Count: 296 10*3/uL (ref 150–400)
RBC: 4.46 MIL/uL (ref 4.22–5.81)
RDW: 14 % (ref 11.5–15.5)
WBC Count: 6.2 10*3/uL (ref 4.0–10.5)
nRBC: 0 % (ref 0.0–0.2)

## 2021-09-15 LAB — CMP (CANCER CENTER ONLY)
ALT: 14 U/L (ref 0–44)
AST: 16 U/L (ref 15–41)
Albumin: 3.9 g/dL (ref 3.5–5.0)
Alkaline Phosphatase: 67 U/L (ref 38–126)
Anion gap: 10 (ref 5–15)
BUN: 14 mg/dL (ref 8–23)
CO2: 28 mmol/L (ref 22–32)
Calcium: 9.2 mg/dL (ref 8.9–10.3)
Chloride: 102 mmol/L (ref 98–111)
Creatinine: 0.85 mg/dL (ref 0.61–1.24)
GFR, Estimated: >60 mL/min (ref >60)
Glucose, Bld: 100 mg/dL — ABNORMAL HIGH (ref 70–99)
Potassium: 3.8 mmol/L (ref 3.5–5.1)
Sodium: 140 mmol/L (ref 135–145)
Total Bilirubin: 0.4 mg/dL (ref 0.3–1.2)
Total Protein: 7.3 g/dL (ref 6.5–8.1)

## 2021-09-15 LAB — TOTAL PROTEIN, URINE DIPSTICK: Protein, ur: NEGATIVE mg/dL

## 2021-09-15 MED ORDER — SODIUM CHLORIDE 0.9 % IV SOLN
5.0000 mg/kg | Freq: Once | INTRAVENOUS | Status: AC
  Administered 2021-09-15: 450 mg via INTRAVENOUS
  Filled 2021-09-15: qty 4

## 2021-09-15 MED ORDER — SODIUM CHLORIDE 0.9 % IV SOLN
10.0000 mg | Freq: Once | INTRAVENOUS | Status: AC
  Administered 2021-09-15: 10 mg via INTRAVENOUS
  Filled 2021-09-15: qty 10

## 2021-09-15 MED ORDER — ATROPINE SULFATE 1 MG/ML IV SOLN
0.5000 mg | Freq: Once | INTRAVENOUS | Status: AC | PRN
  Administered 2021-09-15: 0.5 mg via INTRAVENOUS
  Filled 2021-09-15: qty 1

## 2021-09-15 MED ORDER — SODIUM CHLORIDE 0.9 % IV SOLN
180.0000 mg/m2 | Freq: Once | INTRAVENOUS | Status: AC
  Administered 2021-09-15: 380 mg via INTRAVENOUS
  Filled 2021-09-15: qty 15

## 2021-09-15 MED ORDER — SODIUM CHLORIDE 0.9 % IV SOLN
300.0000 mg/m2 | Freq: Once | INTRAVENOUS | Status: AC
  Administered 2021-09-15: 618 mg via INTRAVENOUS
  Filled 2021-09-15: qty 30.9

## 2021-09-15 MED ORDER — FLUOROURACIL CHEMO INJECTION 2.5 GM/50ML
300.0000 mg/m2 | Freq: Once | INTRAVENOUS | Status: AC
  Administered 2021-09-15: 600 mg via INTRAVENOUS
  Filled 2021-09-15: qty 12

## 2021-09-15 MED ORDER — SODIUM CHLORIDE 0.9 % IV SOLN
2000.0000 mg/m2 | INTRAVENOUS | Status: DC
  Administered 2021-09-15: 4100 mg via INTRAVENOUS
  Filled 2021-09-15: qty 82

## 2021-09-15 MED ORDER — PALONOSETRON HCL INJECTION 0.25 MG/5ML
0.2500 mg | Freq: Once | INTRAVENOUS | Status: AC
  Administered 2021-09-15: 0.25 mg via INTRAVENOUS
  Filled 2021-09-15: qty 5

## 2021-09-15 MED ORDER — SODIUM CHLORIDE 0.9 % IV SOLN: Freq: Once | INTRAVENOUS | Status: AC

## 2021-09-15 NOTE — Progress Notes (Signed)
0.9% Sodium Chloride for 5FU CIV Lot:  [id number], Exp: 2/24  Raul Del Valdez, [place], BCPS, BCOP 09/15/2021 2:09 PM

## 2021-09-15 NOTE — Progress Notes (Signed)
  [place] OFFICE PROGRESS NOTE   Diagnosis: Colon cancer  INTERVAL HISTORY:   Craig Best returns as scheduled.  He completed cycle 1 FOLFIRI/bevacizumab 09/01/2021.  He had mild nausea for a few days.  The nausea was relieved with Compazine.  No vomiting.  No mouth sores though he did note some tenderness beneath the lower plate.  No significant diarrhea.  He denies bleeding.  Dyspnea is not a significant issue at present.  Objective:  Vital signs in last 24 hours:  Blood pressure 139/71, pulse 64, temperature 98.1 F (36.7 C), temperature source Oral, resp. rate 18, height $RemoveBe'5\' 8"'AQskSXymA$  (1.727 m), weight 192 lb (87.1 kg), SpO2 96 %.    HEENT: No thrush or ulcers. Resp: Breath sounds mildly diminished at the left lung base.  No respiratory distress. Cardio: Regular rate and rhythm. GI: Abdomen soft and nontender.  No hepatosplenomegaly. Vascular: No leg edema. Skin: Palms without erythema. Port-A-Cath without erythema   Lab Results:  Lab Results  Component Value Date   WBC 6.2 09/15/2021   HGB 14.3 09/15/2021   HCT 41.4 09/15/2021   MCV 92.8 09/15/2021   PLT 296 09/15/2021   NEUTROABS 3.5 09/15/2021    Imaging:  No results found.  Medications: I have reviewed the patient's current medications.  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), status post a sigmoid colectomy and partial bladder resection 09/19/2020 2 separate sigmoid colon tumors, 2.5 cm apart, 1/13 lymph nodes positive, tumor extends to adherent soft tissue of the bladder, MSS, normal mismatch repair protein expression tumor mutation burden 0, K-ras G12D CT abdomen/pelvis 09/19/2020-sigmoid colon mass with colonic obstruction, 6 mm right lower lobe nodule CT chest 09/22/2020-multiple bilateral lung nodules consistent with metastases Elevated preoperative CEA Cycle 1 FOLFOX 10/28/2020 Cycle 2 FOLFOX 11/11/2020 Cycle 3 FOLFOX 11/25/2020 Cycle 4 FOLFOX 12/09/2020 Cycle 5 FOLFOX  12/23/2020 CT chest 12/25/2020-unchanged lung nodules, 2 cm subpleural nodule in the left posterior chest more prominent-loculated fluid? Cycle 6 FOLFOX 02/03/2021-5-FU dose reduced and bolus eliminated secondary to diarrhea Cycle 7 FOLFOX 02/17/2021 Cycle 8 FOLFOX 03/03/2021 PET 03/17/2021 pulmonary nodules without significant change, 10 mm nodule in right middle lobe with mild FDG uptake, mildly hypermetabolic pleural nodule in the lower left hemothorax, new clustered nodularity in the apical and posterior right upper lobe-likely infectious CTs 07/23/2021-new left pleural effusion with multiple pleural nodules, increased size of several pulmonary nodules, stable left paracolic gutter nodule, morphologic features suggestive of early cirrhosis Thoracentesis 07/24/2021-1200 cc of fluid removed, rare atypical cells present Thoracentesis 627 2023-1.5 L of fluid removed, atypical cells present, acute inflammation and blood Thoracentesis 08/14/2021- 1.2 L of fluid removed, adenocarcinoma compatible with metastatic colon cancer Thoracentesis 08/21/2021-adenocarcinoma Cycle 1 FOLFIRI/bevacizumab 09/01/2021 Cycle 2 FOLFIRI/bevacizumab 09/15/2021  2.  Port-A-Cath placement-Dr. 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 appears stable.  He tolerated the first cycle of FOLFIRI/bevacizumab well.  Plan to proceed with cycle 2 today as scheduled.  CBC reviewed.  Counts adequate to proceed with treatment.  He will return for lab, follow-up, cycle 3 FOLFIRI/bevacizumab in 2 weeks.  He will contact the office in the interim with any problems.  Ned Card ANP/GNP-BC   09/15/2021  8:26 AM

## 2021-09-15 NOTE — Progress Notes (Signed)
Patient seen by Ned Card NP today  Vitals are within treatment parameters.  Labs reviewed by Ned Card NP and are within treatment parameters. Awaiting sample for urine protein for Avastin  Per physician team, patient is ready for treatment and there are NO modifications to the treatment plan.

## 2021-09-15 NOTE — Patient Instructions (Addendum)
[facility]   Discharge Instructions: Thank you for choosing [facility] to provide your oncology and hematology care.   If you have a lab appointment with the [facility], please go directly to the [facility] and check in at the registration area.   Wear comfortable clothing and clothing appropriate for easy access to any Portacath or PICC line.   We strive to give you quality time with your provider. You may need to reschedule your appointment if you arrive late (15 or more minutes).  Arriving late affects you and other patients whose appointments are after yours.  Also, if you miss three or more appointments without notifying the office, you may be dismissed from the clinic at the provider's discretion.      For prescription refill requests, have your pharmacy contact our office and allow 72 hours for refills to be completed.    Today you received the following chemotherapy and/or immunotherapy agents Bevacizumab-bvzr (ZIRABEV), Irinotecan (CAMPTOSAR), Leucovorin & Flourouracil (ADRUCIL).      To help prevent nausea and vomiting after your treatment, we encourage you to take your nausea medication as directed.  BELOW ARE SYMPTOMS THAT SHOULD BE REPORTED IMMEDIATELY: *FEVER GREATER THAN 100.4 F (38 C) OR HIGHER *CHILLS OR SWEATING *NAUSEA AND VOMITING THAT IS NOT CONTROLLED WITH YOUR NAUSEA MEDICATION *UNUSUAL SHORTNESS OF BREATH *UNUSUAL BRUISING OR BLEEDING *URINARY PROBLEMS (pain or burning when urinating, or frequent urination) *BOWEL PROBLEMS (unusual diarrhea, constipation, pain near the anus) TENDERNESS IN MOUTH AND THROAT WITH OR WITHOUT PRESENCE OF ULCERS (sore throat, sores in mouth, or a toothache) UNUSUAL RASH, SWELLING OR PAIN  UNUSUAL VAGINAL DISCHARGE OR ITCHING   Items with * indicate a potential emergency and should be followed up as soon as possible or go to the Emergency Department if any problems should occur.  Please  show the CHEMOTHERAPY ALERT CARD or IMMUNOTHERAPY ALERT CARD at check-in to the Emergency Department and triage nurse.  Should you have questions after your visit or need to cancel or reschedule your appointment, please contact [facility]  Dept: [phone]  and follow the prompts.  Office hours are 8:00 a.m. to 4:30 p.m. Monday - Friday. Please note that voicemails left after 4:00 p.m. may not be returned until the following business day.  We are closed weekends and major holidays. You have access to a nurse at all times for urgent questions. Please call the main number to the clinic Dept: [phone] and follow the prompts.   For any non-urgent questions, you may also contact your provider using MyChart. We now offer e-Visits for anyone 71 and older to request care online for non-urgent symptoms. For details visit [email or web address].   Also download the MyChart app! Go to the app store, search "MyChart", open the app, select [facility], and log in with your MyChart username and password.  Masks are optional in the cancer centers. If you would like for your care team to wear a mask while they are taking care of you, please let them know. For doctor visits, patients may have with them one support person who is at least 79 years old. At this time, visitors are not allowed in the infusion area.  Bevacizumab injection What is this medication? BEVACIZUMAB (be va SIZ yoo mab) is a monoclonal antibody. It is used to treat many types of cancer. This medicine may be used for other purposes; ask your health care provider or pharmacist if you  have questions. COMMON BRAND NAME(S): Alymsys, Avastin, MVASI, Noah Charon What should I tell my care team before I take this medication? They need to know if you have any of these conditions: diabetes heart disease high blood pressure history of coughing up blood prior anthracycline chemotherapy (e.g., doxorubicin, daunorubicin,  epirubicin) recent or ongoing radiation therapy recent or planning to have surgery stroke an unusual or allergic reaction to bevacizumab, hamster proteins, mouse proteins, other medicines, foods, dyes, or preservatives pregnant or trying to get pregnant breast-feeding How should I use this medication? This medicine is for infusion into a vein. It is given by a health care professional in a hospital or clinic setting. Talk to your pediatrician regarding the use of this medicine in children. Special care may be needed. Overdosage: If you think you have taken too much of this medicine contact a poison control center or emergency room at once. NOTE: This medicine is only for you. Do not share this medicine with others. What if I miss a dose? It is important not to miss your dose. Call your doctor or health care professional if you are unable to keep an appointment. What may interact with this medication? Interactions are not expected. This list may not describe all possible interactions. Give your health care provider a list of all the medicines, herbs, non-prescription drugs, or dietary supplements you use. Also tell them if you smoke, drink alcohol, or use illegal drugs. Some items may interact with your medicine. What should I watch for while using this medication? Your condition will be monitored carefully while you are receiving this medicine. You will need important blood work and urine testing done while you are taking this medicine. This medicine may increase your risk to bruise or bleed. Call your doctor or health care professional if you notice any unusual bleeding. Before having surgery, talk to your health care provider to make sure it is ok. This drug can increase the risk of poor healing of your surgical site or wound. You will need to stop this drug for 28 days before surgery. After surgery, wait at least 28 days before restarting this drug. Make sure the surgical site or wound is  healed enough before restarting this drug. Talk to your health care provider if questions. Do not become pregnant while taking this medicine or for 6 months after stopping it. Women should inform their doctor if they wish to become pregnant or think they might be pregnant. There is a potential for serious side effects to an unborn child. Talk to your health care professional or pharmacist for more information. Do not breast-feed an infant while taking this medicine and for 6 months after the last dose. This medicine has caused ovarian failure in some women. This medicine may interfere with the ability to have a child. You should talk to your doctor or health care professional if you are concerned about your fertility. What side effects may I notice from receiving this medication? Side effects that you should report to your doctor or health care professional as soon as possible: allergic reactions like skin rash, itching or hives, swelling of the face, lips, or tongue chest pain or chest tightness chills coughing up blood high fever seizures severe constipation signs and symptoms of bleeding such as bloody or black, tarry stools; red or dark-brown urine; spitting up blood or brown material that looks like coffee grounds; red spots on the skin; unusual bruising or bleeding from the eye, gums, or nose signs and symptoms of  a blood clot such as breathing problems; chest pain; severe, sudden headache; pain, swelling, warmth in the leg signs and symptoms of a stroke like changes in vision; confusion; trouble speaking or understanding; severe headaches; sudden numbness or weakness of the face, arm or leg; trouble walking; dizziness; loss of balance or coordination stomach pain sweating swelling of legs or ankles vomiting weight gain Side effects that usually do not require medical attention (report to your doctor or health care professional if they continue or are bothersome): back pain changes in  taste decreased appetite dry skin nausea tiredness This list may not describe all possible side effects. Call your doctor for medical advice about side effects. You may report side effects to FDA at 1-800-FDA-1088. Where should I keep my medication? This drug is given in a hospital or clinic and will not be stored at home. NOTE: This sheet is a summary. It may not cover all possible information. If you have questions about this medicine, talk to your doctor, pharmacist, or health care provider.  2023 Elsevier/Gold Standard (2020-12-26 00:00:00) Irinotecan injection What is this medication? IRINOTECAN (ir in oh TEE kan ) is a chemotherapy drug. It is used to treat colon and rectal cancer. This medicine may be used for other purposes; ask your health care provider or pharmacist if you have questions. COMMON BRAND NAME(S): Camptosar What should I tell my care team before I take this medication? They need to know if you have any of these conditions: dehydration diarrhea infection (especially a virus infection such as chickenpox, cold sores, or herpes) liver disease low blood counts, like low white cell, platelet, or red cell counts low levels of calcium, magnesium, or potassium in the blood recent or ongoing radiation therapy an unusual or allergic reaction to irinotecan, other medicines, foods, dyes, or preservatives pregnant or trying to get pregnant breast-feeding How should I use this medication? This drug is given as an infusion into a vein. It is administered in a hospital or clinic by a specially trained health care professional. Talk to your pediatrician regarding the use of this medicine in children. Special care may be needed. Overdosage: If you think you have taken too much of this medicine contact a poison control center or emergency room at once. NOTE: This medicine is only for you. Do not share this medicine with others. What if I miss a dose? It is important not to miss  your dose. Call your doctor or health care professional if you are unable to keep an appointment. What may interact with this medication? Do not take this medicine with any of the following medications: cobicistat itraconazole This medicine may interact with the following medications: antiviral medicines for HIV or AIDS certain antibiotics like rifampin or rifabutin certain medicines for fungal infections like ketoconazole, posaconazole, and voriconazole certain medicines for seizures like carbamazepine, phenobarbital, phenotoin clarithromycin gemfibrozil nefazodone St. John's Wort This list may not describe all possible interactions. Give your health care provider a list of all the medicines, herbs, non-prescription drugs, or dietary supplements you use. Also tell them if you smoke, drink alcohol, or use illegal drugs. Some items may interact with your medicine. What should I watch for while using this medication? Your condition will be monitored carefully while you are receiving this medicine. You will need important blood work done while you are taking this medicine. This drug may make you feel generally unwell. This is not uncommon, as chemotherapy can affect healthy cells as well as cancer cells. Report any  side effects. Continue your course of treatment even though you feel ill unless your doctor tells you to stop. In some cases, you may be given additional medicines to help with side effects. Follow all directions for their use. You may get drowsy or dizzy. Do not drive, use machinery, or do anything that needs mental alertness until you know how this medicine affects you. Do not stand or sit up quickly, especially if you are an older patient. This reduces the risk of dizzy or fainting spells. Call your health care professional for advice if you get a fever, chills, or sore throat, or other symptoms of a cold or flu. Do not treat yourself. This medicine decreases your body's ability to  fight infections. Try to avoid being around people who are sick. Avoid taking products that contain aspirin, acetaminophen, ibuprofen, naproxen, or ketoprofen unless instructed by your doctor. These medicines may hide a fever. This medicine may increase your risk to bruise or bleed. Call your doctor or health care professional if you notice any unusual bleeding. Be careful brushing and flossing your teeth or using a toothpick because you may get an infection or bleed more easily. If you have any dental work done, tell your dentist you are receiving this medicine. Do not become pregnant while taking this medicine or for 6 months after stopping it. Women should inform their health care professional if they wish to become pregnant or think they might be pregnant. Men should not father a child while taking this medicine and for 3 months after stopping it. There is potential for serious side effects to an unborn child. Talk to your health care professional for more information. Do not breast-feed an infant while taking this medicine or for 7 days after stopping it. This medicine has caused ovarian failure in some women. This medicine may make it more difficult to get pregnant. Talk to your health care professional if you are concerned about your fertility. This medicine has caused decreased sperm counts in some men. This may make it more difficult to father a child. Talk to your health care professional if you are concerned about your fertility. What side effects may I notice from receiving this medication? Side effects that you should report to your doctor or health care professional as soon as possible: allergic reactions like skin rash, itching or hives, swelling of the face, lips, or tongue chest pain diarrhea flushing, runny nose, sweating during infusion low blood counts - this medicine may decrease the number of white blood cells, red blood cells and platelets. You may be at increased risk for  infections and bleeding. nausea, vomiting pain, swelling, warmth in the leg signs of decreased platelets or bleeding - bruising, pinpoint red spots on the skin, black, tarry stools, blood in the urine signs of infection - fever or chills, cough, sore throat, pain or difficulty passing urine signs of decreased red blood cells - unusually weak or tired, fainting spells, lightheadedness Side effects that usually do not require medical attention (report to your doctor or health care professional if they continue or are bothersome): constipation hair loss headache loss of appetite mouth sores stomach pain This list may not describe all possible side effects. Call your doctor for medical advice about side effects. You may report side effects to FDA at 1-800-FDA-1088. Where should I keep my medication? This drug is given in a hospital or clinic and will not be stored at home. NOTE: This sheet is a summary. It may not cover  all possible information. If you have questions about this medicine, talk to your doctor, pharmacist, or health care provider.  2023 Elsevier/Gold Standard (2020-12-26 00:00:00) Leucovorin injection What is this medication? LEUCOVORIN (loo koe VOR in) is used to prevent or treat the harmful effects of some medicines. This medicine is used to treat anemia caused by a low amount of folic acid in the body. It is also used with 5-fluorouracil (5-FU) to treat colon cancer. This medicine may be used for other purposes; ask your health care provider or pharmacist if you have questions. What should I tell my care team before I take this medication? They need to know if you have any of these conditions: anemia from low levels of vitamin B-12 in the blood an unusual or allergic reaction to leucovorin, folic acid, other medicines, foods, dyes, or preservatives pregnant or trying to get pregnant breast-feeding How should I use this medication? This medicine is for injection into a  muscle or into a vein. It is given by a health care professional in a hospital or clinic setting. Talk to your pediatrician regarding the use of this medicine in children. Special care may be needed. Overdosage: If you think you have taken too much of this medicine contact a poison control center or emergency room at once. NOTE: This medicine is only for you. Do not share this medicine with others. What if I miss a dose? This does not apply. What may interact with this medication? capecitabine fluorouracil phenobarbital phenytoin primidone trimethoprim-sulfamethoxazole This list may not describe all possible interactions. Give your health care provider a list of all the medicines, herbs, non-prescription drugs, or dietary supplements you use. Also tell them if you smoke, drink alcohol, or use illegal drugs. Some items may interact with your medicine. What should I watch for while using this medication? Your condition will be monitored carefully while you are receiving this medicine. This medicine may increase the side effects of 5-fluorouracil, 5-FU. Tell your doctor or health care professional if you have diarrhea or mouth sores that do not get better or that get worse. What side effects may I notice from receiving this medication? Side effects that you should report to your doctor or health care professional as soon as possible: allergic reactions like skin rash, itching or hives, swelling of the face, lips, or tongue breathing problems fever, infection mouth sores unusual bleeding or bruising unusually weak or tired Side effects that usually do not require medical attention (report to your doctor or health care professional if they continue or are bothersome): constipation or diarrhea loss of appetite nausea, vomiting This list may not describe all possible side effects. Call your doctor for medical advice about side effects. You may report side effects to FDA at  1-800-FDA-1088. Where should I keep my medication? This drug is given in a hospital or clinic and will not be stored at home. NOTE: This sheet is a summary. It may not cover all possible information. If you have questions about this medicine, talk to your doctor, pharmacist, or health care provider.  2023 Elsevier/Gold Standard (2007-08-03 00:00:00) Fluorouracil, 5-FU injection What is this medication? FLUOROURACIL, 5-FU (flure oh YOOR a sil) is a chemotherapy drug. It slows the growth of cancer cells. This medicine is used to treat many types of cancer like breast cancer, colon or rectal cancer, pancreatic cancer, and stomach cancer. This medicine may be used for other purposes; ask your health care provider or pharmacist if you have questions. COMMON BRAND NAME(S):  Adrucil What should I tell my care team before I take this medication? They need to know if you have any of these conditions: blood disorders dihydropyrimidine dehydrogenase (DPD) deficiency infection (especially a virus infection such as chickenpox, cold sores, or herpes) kidney disease liver disease malnourished, poor nutrition recent or ongoing radiation therapy an unusual or allergic reaction to fluorouracil, other chemotherapy, other medicines, foods, dyes, or preservatives pregnant or trying to get pregnant breast-feeding How should I use this medication? This drug is given as an infusion or injection into a vein. It is administered in a hospital or clinic by a specially trained health care professional. Talk to your pediatrician regarding the use of this medicine in children. Special care may be needed. Overdosage: If you think you have taken too much of this medicine contact a poison control center or emergency room at once. NOTE: This medicine is only for you. Do not share this medicine with others. What if I miss a dose? It is important not to miss your dose. Call your doctor or health care professional if you are  unable to keep an appointment. What may interact with this medication? Do not take this medicine with any of the following medications: live virus vaccines This medicine may also interact with the following medications: medicines that treat or prevent blood clots like warfarin, enoxaparin, and dalteparin This list may not describe all possible interactions. Give your health care provider a list of all the medicines, herbs, non-prescription drugs, or dietary supplements you use. Also tell them if you smoke, drink alcohol, or use illegal drugs. Some items may interact with your medicine. What should I watch for while using this medication? Visit your doctor for checks on your progress. This drug may make you feel generally unwell. This is not uncommon, as chemotherapy can affect healthy cells as well as cancer cells. Report any side effects. Continue your course of treatment even though you feel ill unless your doctor tells you to stop. In some cases, you may be given additional medicines to help with side effects. Follow all directions for their use. Call your doctor or health care professional for advice if you get a fever, chills or sore throat, or other symptoms of a cold or flu. Do not treat yourself. This drug decreases your body's ability to fight infections. Try to avoid being around people who are sick. This medicine may increase your risk to bruise or bleed. Call your doctor or health care professional if you notice any unusual bleeding. Be careful brushing and flossing your teeth or using a toothpick because you may get an infection or bleed more easily. If you have any dental work done, tell your dentist you are receiving this medicine. Avoid taking products that contain aspirin, acetaminophen, ibuprofen, naproxen, or ketoprofen unless instructed by your doctor. These medicines may hide a fever. Do not become pregnant while taking this medicine. Women should inform their doctor if they wish  to become pregnant or think they might be pregnant. There is a potential for serious side effects to an unborn child. Talk to your health care professional or pharmacist for more information. Do not breast-feed an infant while taking this medicine. Men should inform their doctor if they wish to father a child. This medicine may lower sperm counts. Do not treat diarrhea with over the counter products. Contact your doctor if you have diarrhea that lasts more than 2 days or if it is severe and watery. This medicine can make you more  sensitive to the sun. Keep out of the sun. If you cannot avoid being in the sun, wear protective clothing and use sunscreen. Do not use sun lamps or tanning beds/booths. What side effects may I notice from receiving this medication? Side effects that you should report to your doctor or health care professional as soon as possible: allergic reactions like skin rash, itching or hives, swelling of the face, lips, or tongue low blood counts - this medicine may decrease the number of white blood cells, red blood cells and platelets. You may be at increased risk for infections and bleeding. signs of infection - fever or chills, cough, sore throat, pain or difficulty passing urine signs of decreased platelets or bleeding - bruising, pinpoint red spots on the skin, black, tarry stools, blood in the urine signs of decreased red blood cells - unusually weak or tired, fainting spells, lightheadedness breathing problems changes in vision chest pain mouth sores nausea and vomiting pain, swelling, redness at site where injected pain, tingling, numbness in the hands or feet redness, swelling, or sores on hands or feet stomach pain unusual bleeding Side effects that usually do not require medical attention (report to your doctor or health care professional if they continue or are bothersome): changes in finger or toe nails diarrhea dry or itchy skin hair loss headache loss of  appetite sensitivity of eyes to the light stomach upset unusually teary eyes This list may not describe all possible side effects. Call your doctor for medical advice about side effects. You may report side effects to FDA at 1-800-FDA-1088. Where should I keep my medication? This drug is given in a hospital or clinic and will not be stored at home. NOTE: This sheet is a summary. It may not cover all possible information. If you have questions about this medicine, talk to your doctor, pharmacist, or health care provider.  2023 Elsevier/Gold Standard (2020-12-26 00:00:00) The chemotherapy medication bag should finish at 46 hours, 96 hours, or 7 days. For example, if your pump is scheduled for 46 hours and it was put on at 4:00 p.m., it should finish at 2:00 p.m. the day it is scheduled to come off regardless of your appointment time.     Estimated time to finish at 10:30 a.m. on Thursday 09/17/2021.   If the display on your pump reads "Low Volume" and it is beeping, take the batteries out of the pump and come to the cancer center for it to be taken off.   If the pump alarms go off prior to the pump reading "Low Volume" then call [phone] and someone can assist you.  If the plunger comes out and the chemotherapy medication is leaking out, please use your home chemo spill kit to clean up the spill. Do NOT use paper towels or other household products.  If you have problems or questions regarding your pump, please call either [phone] (24 hours a day) or the cancer center Monday-Friday 8:00 a.m.- 4:30 p.m. at the clinic number and we will assist you. If you are unable to get assistance, then go to the nearest Emergency Department and ask the staff to contact the IV team for assistance.

## 2021-09-15 NOTE — Patient Instructions (Signed)

## 2021-09-16 ENCOUNTER — Other Ambulatory Visit: Payer: Self-pay

## 2021-09-17 ENCOUNTER — Inpatient Hospital Stay: Payer: Medicare Other

## 2021-09-17 VITALS — BP 140/69 | HR 60 | Temp 98.3°F | Resp 20

## 2021-09-17 DIAGNOSIS — Z452 Encounter for adjustment and management of vascular access device: Secondary | ICD-10-CM | POA: Diagnosis not present

## 2021-09-17 DIAGNOSIS — C7801 Secondary malignant neoplasm of right lung: Secondary | ICD-10-CM | POA: Diagnosis not present

## 2021-09-17 DIAGNOSIS — C7802 Secondary malignant neoplasm of left lung: Secondary | ICD-10-CM | POA: Diagnosis not present

## 2021-09-17 DIAGNOSIS — C187 Malignant neoplasm of sigmoid colon: Secondary | ICD-10-CM

## 2021-09-17 DIAGNOSIS — Z5112 Encounter for antineoplastic immunotherapy: Secondary | ICD-10-CM | POA: Diagnosis not present

## 2021-09-17 DIAGNOSIS — Z5111 Encounter for antineoplastic chemotherapy: Secondary | ICD-10-CM | POA: Diagnosis not present

## 2021-09-17 MED ORDER — SODIUM CHLORIDE 0.9% FLUSH
10.0000 mL | INTRAVENOUS | Status: DC | PRN
  Administered 2021-09-17: 10 mL

## 2021-09-17 MED ORDER — HEPARIN SOD (PORK) LOCK FLUSH 100 UNIT/ML IV SOLN
500.0000 [IU] | Freq: Once | INTRAVENOUS | Status: AC | PRN
  Administered 2021-09-17: 500 [IU]

## 2021-09-17 NOTE — Patient Instructions (Signed)
Heparin injection What is this medication? HEPARIN (HEP a rin) is an anticoagulant. It is used to treat or prevent clots in the veins, arteries, lungs, or heart. It stops clots from forming or getting bigger. This medicine prevents clotting during open-heart surgery, dialysis, or in patients who are confined to bed. This medicine may be used for other purposes; ask your health care provider or pharmacist if you have questions. COMMON BRAND NAME(S): Hep-Lock, Hep-Lock U/P, Hepflush-10, Monoject Prefill Advanced Heparin Lock Flush, SASH Normal Saline and Heparin What should I tell my care team before I take this medication? They need to know if you have any of these conditions: bleeding disorders, such as hemophilia or low blood platelets bowel disease or diverticulitis endocarditis high blood pressure liver disease recent surgery or delivery of a baby stomach ulcers an unusual or allergic reaction to heparin, benzyl alcohol, sulfites, other medicines, foods, dyes, or preservatives pregnant or trying to get pregnant breast-feeding How should I use this medication? This medicine is given by injection or infusion into a vein. It can also be given by injection of small amounts under the skin. It is usually given by a health care professional in a hospital or clinic setting. If you get this medicine at home, you will be taught how to prepare and give this medicine. Use exactly as directed. Take your medicine at regular intervals. Do not take it more often than directed. Do not stop taking except on your doctor's advice. Stopping this medicine may increase your risk of a blot clot. Be sure to refill your prescription before you run out of medicine. It is important that you put your used needles and syringes in a special sharps container. Do not put them in a trash can. If you do not have a sharps container, call your pharmacist or healthcare provider to get one. Talk to your pediatrician regarding the  use of this medicine in children. While this medicine may be prescribed for children for selected conditions, precautions do apply. Overdosage: If you think you have taken too much of this medicine contact a poison control center or emergency room at once. NOTE: This medicine is only for you. Do not share this medicine with others. What if I miss a dose? If you miss a dose, take it as soon as you can. If it is almost time for your next dose, take only that dose. Do not take double or extra doses. What may interact with this medication? Do not take this medicine with any of the following medications: aspirin and aspirin-like drugs mifepristone medicines that treat or prevent blood clots like warfarin, enoxaparin, and dalteparin palifermin protamine This medicine may also interact with the following medications: dextran digoxin hydroxychloroquine medicines for treating colds or allergies nicotine NSAIDs, medicines for pain and inflammation, like ibuprofen or naproxen phenylbutazone tetracycline antibiotics This list may not describe all possible interactions. Give your health care provider a list of all the medicines, herbs, non-prescription drugs, or dietary supplements you use. Also tell them if you smoke, drink alcohol, or use illegal drugs. Some items may interact with your medicine. What should I watch for while using this medication? Visit your healthcare professional for regular checks on your progress. You may need blood work done while you are taking this medicine. Your condition will be monitored carefully while you are receiving this medicine. It is important not to miss any appointments. Wear a medical ID bracelet or chain, and carry a card that describes your disease and details  of your medicine and dosage times. Notify your doctor or healthcare professional at once if you have cold, blue hands or feet. If you are going to need surgery or other procedure, tell your healthcare  professional that you are using this medicine. Avoid sports and activities that might cause injury while you are using this medicine. Severe falls or injuries can cause unseen bleeding. Be careful when using sharp tools or knives. Consider using an Copy. Take special care brushing or flossing your teeth. Report any injuries, bruising, or red spots on the skin to your healthcare professional. Using this medicine for a long time may weaken your bones and increase the risk of bone fractures. You should make sure that you get enough calcium and vitamin D while you are taking this medicine. Discuss the foods you eat and the vitamins you take with your healthcare professional. Wear a medical ID bracelet or chain. Carry a card that describes your disease and details of your medicine and dosage times. What side effects may I notice from receiving this medication? Side effects that you should report to your doctor or health care professional as soon as possible: allergic reactions like skin rash, itching or hives, swelling of the face, lips, or tongue bone pain fever, chills nausea, vomiting signs and symptoms of bleeding such as bloody or black, tarry stools; red or dark-brown urine; spitting up blood or brown material that looks like coffee grounds; red spots on the skin; unusual bruising or bleeding from the eye, gums, or nose signs and symptoms of a blood clot such as chest pain; shortness of breath; pain, swelling, or warmth in the leg signs and symptoms of a stroke such as changes in vision; confusion; trouble speaking or understanding; severe headaches; sudden numbness or weakness of the face, arm or leg; trouble walking; dizziness; loss of coordination Side effects that usually do not require medical attention (report to your doctor or health care professional if they continue or are bothersome): hair loss pain, redness, or irritation at site where injected This list may not describe all  possible side effects. Call your doctor for medical advice about side effects. You may report side effects to FDA at 1-800-FDA-1088. Where should I keep my medication? Keep out of the reach of children. Store unopened vials at room temperature between 15 and 30 degrees C (59 and 86 degrees F). Do not freeze. Do not use if solution is discolored or particulate matter is present. Throw away any unused medicine after the expiration date. NOTE: This sheet is a summary. It may not cover all possible information. If you have questions about this medicine, talk to your doctor, pharmacist, or health care provider.  2023 [employer]/Gold Standard (2004-11-02 00:00:00)

## 2021-09-28 ENCOUNTER — Other Ambulatory Visit: Payer: Self-pay | Admitting: Oncology

## 2021-09-28 NOTE — Progress Notes (Signed)
[facility] - Colorectal  No Change  Continue With Treatment as Ordered.  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

## 2021-09-29 ENCOUNTER — Inpatient Hospital Stay: Payer: Medicare Other

## 2021-09-29 ENCOUNTER — Inpatient Hospital Stay: Payer: Medicare Other | Admitting: Oncology

## 2021-09-29 ENCOUNTER — Encounter: Payer: Self-pay | Admitting: *Deleted

## 2021-09-29 VITALS — BP 142/73 | HR 59

## 2021-09-29 VITALS — BP 141/69 | HR 63 | Temp 98.2°F | Resp 18 | Ht 68.0 in | Wt 192.2 lb

## 2021-09-29 DIAGNOSIS — C7801 Secondary malignant neoplasm of right lung: Secondary | ICD-10-CM | POA: Diagnosis not present

## 2021-09-29 DIAGNOSIS — C7802 Secondary malignant neoplasm of left lung: Secondary | ICD-10-CM | POA: Diagnosis not present

## 2021-09-29 DIAGNOSIS — Z5112 Encounter for antineoplastic immunotherapy: Secondary | ICD-10-CM | POA: Diagnosis not present

## 2021-09-29 DIAGNOSIS — C187 Malignant neoplasm of sigmoid colon: Secondary | ICD-10-CM

## 2021-09-29 DIAGNOSIS — Z5111 Encounter for antineoplastic chemotherapy: Secondary | ICD-10-CM | POA: Diagnosis not present

## 2021-09-29 DIAGNOSIS — Z452 Encounter for adjustment and management of vascular access device: Secondary | ICD-10-CM | POA: Diagnosis not present

## 2021-09-29 LAB — CBC WITH DIFFERENTIAL (CANCER CENTER ONLY)
Abs Immature Granulocytes: 0.01 10*3/uL (ref 0.00–0.07)
Basophils Absolute: 0.1 10*3/uL (ref 0.0–0.1)
Basophils Relative: 2 %
Eosinophils Absolute: 0.3 10*3/uL (ref 0.0–0.5)
Eosinophils Relative: 5 %
HCT: 41.4 % (ref 39.0–52.0)
Hemoglobin: 14.4 g/dL (ref 13.0–17.0)
Immature Granulocytes: 0 %
Lymphocytes Relative: 28 %
Lymphs Abs: 1.8 10*3/uL (ref 0.7–4.0)
MCH: 31.9 pg (ref 26.0–34.0)
MCHC: 34.8 g/dL (ref 30.0–36.0)
MCV: 91.6 fL (ref 80.0–100.0)
Monocytes Absolute: 0.6 10*3/uL (ref 0.1–1.0)
Monocytes Relative: 10 %
Neutro Abs: 3.4 10*3/uL (ref 1.7–7.7)
Neutrophils Relative %: 55 %
Platelet Count: 287 10*3/uL (ref 150–400)
RBC: 4.52 MIL/uL (ref 4.22–5.81)
RDW: 14.8 % (ref 11.5–15.5)
WBC Count: 6.3 10*3/uL (ref 4.0–10.5)
nRBC: 0 % (ref 0.0–0.2)

## 2021-09-29 LAB — CMP (CANCER CENTER ONLY)
ALT: 15 U/L (ref 0–44)
AST: 16 U/L (ref 15–41)
Albumin: 4 g/dL (ref 3.5–5.0)
Alkaline Phosphatase: 71 U/L (ref 38–126)
Anion gap: 8 (ref 5–15)
BUN: 16 mg/dL (ref 8–23)
CO2: 28 mmol/L (ref 22–32)
Calcium: 9 mg/dL (ref 8.9–10.3)
Chloride: 100 mmol/L (ref 98–111)
Creatinine: 0.83 mg/dL (ref 0.61–1.24)
GFR, Estimated: >60 mL/min (ref >60)
Glucose, Bld: 109 mg/dL — ABNORMAL HIGH (ref 70–99)
Potassium: 3.8 mmol/L (ref 3.5–5.1)
Sodium: 136 mmol/L (ref 135–145)
Total Bilirubin: 0.4 mg/dL (ref 0.3–1.2)
Total Protein: 7.2 g/dL (ref 6.5–8.1)

## 2021-09-29 LAB — CEA (ACCESS): CEA (CHCC): 21.08 ng/mL — ABNORMAL HIGH (ref 0.00–5.00)

## 2021-09-29 MED ORDER — SODIUM CHLORIDE 0.9 % IV SOLN: Freq: Once | INTRAVENOUS | Status: AC

## 2021-09-29 MED ORDER — SODIUM CHLORIDE 0.9 % IV SOLN
2000.0000 mg/m2 | INTRAVENOUS | Status: DC
  Administered 2021-09-29: 4100 mg via INTRAVENOUS
  Filled 2021-09-29: qty 82

## 2021-09-29 MED ORDER — SODIUM CHLORIDE 0.9 % IV SOLN
300.0000 mg/m2 | Freq: Once | INTRAVENOUS | Status: AC
  Administered 2021-09-29: 612 mg via INTRAVENOUS
  Filled 2021-09-29: qty 30.6

## 2021-09-29 MED ORDER — SODIUM CHLORIDE 0.9 % IV SOLN
10.0000 mg | Freq: Once | INTRAVENOUS | Status: AC
  Administered 2021-09-29: 10 mg via INTRAVENOUS
  Filled 2021-09-29: qty 10

## 2021-09-29 MED ORDER — PALONOSETRON HCL INJECTION 0.25 MG/5ML
0.2500 mg | Freq: Once | INTRAVENOUS | Status: AC
  Administered 2021-09-29: 0.25 mg via INTRAVENOUS
  Filled 2021-09-29: qty 5

## 2021-09-29 MED ORDER — SODIUM CHLORIDE 0.9 % IV SOLN
5.0000 mg/kg | Freq: Once | INTRAVENOUS | Status: AC
  Administered 2021-09-29: 400 mg via INTRAVENOUS
  Filled 2021-09-29: qty 16

## 2021-09-29 MED ORDER — DEXAMETHASONE 4 MG PO TABS: 4.0000 mg | ORAL_TABLET | ORAL | 1 refills | Status: DC

## 2021-09-29 MED ORDER — FLUOROURACIL CHEMO INJECTION 2.5 GM/50ML
300.0000 mg/m2 | Freq: Once | INTRAVENOUS | Status: AC
  Administered 2021-09-29: 600 mg via INTRAVENOUS
  Filled 2021-09-29: qty 12

## 2021-09-29 MED ORDER — ATROPINE SULFATE 1 MG/ML IV SOLN
0.5000 mg | Freq: Once | INTRAVENOUS | Status: AC | PRN
  Administered 2021-09-29: 0.5 mg via INTRAVENOUS
  Filled 2021-09-29: qty 1

## 2021-09-29 MED ORDER — SODIUM CHLORIDE 0.9 % IV SOLN
180.0000 mg/m2 | Freq: Once | INTRAVENOUS | Status: AC
  Administered 2021-09-29: 360 mg via INTRAVENOUS
  Filled 2021-09-29: qty 15

## 2021-09-29 NOTE — Progress Notes (Signed)
Patient seen by Dr. Benay Spice today  Vitals are within treatment parameters. 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  Labs reviewed by Dr. Benay Spice and are within treatment parameters.  Per physician team, patient is ready for treatment and there are NO modifications to the treatment plan.

## 2021-09-29 NOTE — Patient Instructions (Addendum)
[place]   The chemotherapy medication bag should finish at 46 hours, 96 hours, or 7 days. For example, if your pump is scheduled for 46 hours and it was put on at 4:00 p.m., it should finish at 2:00 p.m. the day it is scheduled to come off regardless of your appointment time.     Estimated time to finish at 11:00 Thursday, August, 24, 2023.   If the display on your pump reads "Low Volume" and it is beeping, take the batteries out of the pump and come to the cancer center for it to be taken off.   If the pump alarms go off prior to the pump reading "Low Volume" then call [phone] and someone can assist you.  If the plunger comes out and the chemotherapy medication is leaking out, please use your home chemo spill kit to clean up the spill. Do NOT use paper towels or other household products.  If you have problems or questions regarding your pump, please call either [phone] (24 hours a day) or the cancer center Monday-Friday 8:00 a.m.- 4:30 p.m. at the clinic number and we will assist you. If you are unable to get assistance, then go to the nearest Emergency Department and ask the staff to contact the IV team for assistance.  Discharge Instructions: Thank you for choosing [facility] to provide your oncology and hematology care.   If you have a lab appointment with the [facility], please go directly to the [facility] and check in at the registration area.   Wear comfortable clothing and clothing appropriate for easy access to any Portacath or PICC line.   We strive to give you quality time with your provider. You may need to reschedule your appointment if you arrive late (15 or more minutes).  Arriving late affects you and other patients whose appointments are after yours.  Also, if you miss three or more appointments without notifying the office, you may be dismissed from the clinic at the provider's discretion.      For prescription  refill requests, have your pharmacy contact our office and allow 72 hours for refills to be completed.    Today you received the following chemotherapy and/or immunotherapy agents Bevacizumab, Irinotecan, Levcovorin, Fluorouracil.      To help prevent nausea and vomiting after your treatment, we encourage you to take your nausea medication as directed.  BELOW ARE SYMPTOMS THAT SHOULD BE REPORTED IMMEDIATELY: *FEVER GREATER THAN 100.4 F (38 C) OR HIGHER *CHILLS OR SWEATING *NAUSEA AND VOMITING THAT IS NOT CONTROLLED WITH YOUR NAUSEA MEDICATION *UNUSUAL SHORTNESS OF BREATH *UNUSUAL BRUISING OR BLEEDING *URINARY PROBLEMS (pain or burning when urinating, or frequent urination) *BOWEL PROBLEMS (unusual diarrhea, constipation, pain near the anus) TENDERNESS IN MOUTH AND THROAT WITH OR WITHOUT PRESENCE OF ULCERS (sore throat, sores in mouth, or a toothache) UNUSUAL RASH, SWELLING OR PAIN  UNUSUAL VAGINAL DISCHARGE OR ITCHING   Items with * indicate a potential emergency and should be followed up as soon as possible or go to the Emergency Department if any problems should occur.  Please show the CHEMOTHERAPY ALERT CARD or IMMUNOTHERAPY ALERT CARD at check-in to the Emergency Department and triage nurse.  Should you have questions after your visit or need to cancel or reschedule your appointment, please contact [facility]  Dept: [phone]  and follow the prompts.  Office hours are 8:00 a.m. to 4:30 p.m. Monday - Friday. Please note that  voicemails left after 4:00 p.m. may not be returned until the following business day.  We are closed weekends and major holidays. You have access to a nurse at all times for urgent questions. Please call the main number to the clinic Dept: [phone] and follow the prompts.   For any non-urgent questions, you may also contact your provider using MyChart. We now offer e-Visits for anyone 39 and older to request care online for  non-urgent symptoms. For details visit [email or web address].   Also download the MyChart app! Go to the app store, search "MyChart", open the app, select [facility], and log in with your MyChart username and password.  Masks are optional in the cancer centers. If you would like for your care team to wear a mask while they are taking care of you, please let them know. You may have one support person who is at least 79 years old accompany you for your appointments.  Bevacizumab Injection What is this medication? BEVACIZUMAB (be va SIZ yoo mab) treats some types of cancer. It works by blocking a protein that causes cancer cells to grow and multiply. This helps to slow or stop the spread of cancer cells. It is a monoclonal antibody. This medicine may be used for other purposes; ask your health care provider or pharmacist if you have questions. COMMON BRAND NAME(S): Alymsys, Avastin, MVASI, Noah Charon What should I tell my care team before I take this medication? They need to know if you have any of these conditions: Blood clots Coughing up blood Having or recent surgery Heart failure High blood pressure History of a connection between 2 or more body parts that do not usually connect (fistula) History of a tear in your stomach or intestines Protein in your urine An unusual or allergic reaction to bevacizumab, other medications, foods, dyes, or preservatives Pregnant or trying to get pregnant Breast-feeding How should I use this medication? This medication is injected into a vein. It is given by your care team in a hospital or clinic setting. Talk to your care team the use of this medication in children. Special care may be needed. Overdosage: If you think you have taken too much of this medicine contact a poison control center or emergency room at once. NOTE: This medicine is only for you. Do not share this medicine with others. What if I miss a dose? Keep appointments for follow-up doses.  It is important not to miss your dose. Call your care team if you are unable to keep an appointment. What may interact with this medication? Interactions are not expected. This list may not describe all possible interactions. Give your health care provider a list of all the medicines, herbs, non-prescription drugs, or dietary supplements you use. Also tell them if you smoke, drink alcohol, or use illegal drugs. Some items may interact with your medicine. What should I watch for while using this medication? Your condition will be monitored carefully while you are receiving this medication. You may need blood work while taking this medication. This medication may make you feel generally unwell. This is not uncommon as chemotherapy can affect healthy cells as well as cancer cells. Report any side effects. Continue your course of treatment even though you feel ill unless your care team tells you to stop. This medication may increase your risk to bruise or bleed. Call your care team if you notice any unusual bleeding. Before having surgery, talk to your care team to make sure it is ok. This medication can  increase the risk of poor healing of your surgical site or wound. You will need to stop this medication for 28 days before surgery. After surgery, wait at least 28 days before restarting this medication. Make sure the surgical site or wound is healed enough before restarting this medication. Talk to your care team if questions. Talk to your care team if you may be pregnant. Serious birth defects can occur if you take this medication during pregnancy and for 6 months after the last dose. Contraception is recommended while taking this medication and for 6 months after the last dose. Your care team can help you find the option that works for you. Do not breastfeed while taking this medication and for 6 months after the last dose. This medication can cause infertility. Talk to your care team if you are concerned  about your fertility. What side effects may I notice from receiving this medication? Side effects that you should report to your care team as soon as possible: Allergic reactions--skin rash, itching, hives, swelling of the face, lips, tongue, or throat Bleeding--bloody or black, tar-like stools, vomiting blood or brown material that looks like coffee grounds, red or dark brown urine, small red or purple spots on skin, unusual bruising or bleeding Blood clot--pain, swelling, or warmth in the leg, shortness of breath, chest pain Heart attack--pain or tightness in the chest, shoulders, arms, or jaw, nausea, shortness of breath, cold or clammy skin, feeling faint or lightheaded Heart failure--shortness of breath, swelling of the ankles, feet, or hands, sudden weight gain, unusual weakness or fatigue Increase in blood pressure Infection--fever, chills, cough, sore throat, wounds that don't heal, pain or trouble when passing urine, general feeling of discomfort or being unwell Infusion reactions--chest pain, shortness of breath or trouble breathing, feeling faint or lightheaded Kidney injury--decrease in the amount of urine, swelling of the ankles, hands, or feet Stomach pain that is severe, does not go away, or gets worse Stroke--sudden numbness or weakness of the face, arm, or leg, trouble speaking, confusion, trouble walking, loss of balance or coordination, dizziness, severe headache, change in vision Sudden and severe headache, confusion, change in vision, seizures, which may be signs of posterior reversible encephalopathy syndrome (PRES) Side effects that usually do not require medical attention (report to your care team if they continue or are bothersome): Back pain Change in taste Diarrhea Dry skin Increased tears Nosebleed This list may not describe all possible side effects. Call your doctor for medical advice about side effects. You may report side effects to FDA at 1-800-FDA-1088. Where  should I keep my medication? This medication is given in a hospital or clinic. It will not be stored at home. NOTE: This sheet is a summary. It may not cover all possible information. If you have questions about this medicine, talk to your doctor, pharmacist, or health care provider.  2023 Elsevier/Gold Standard (2021-06-09 00:00:00)  Irinotecan Injection What is this medication? IRINOTECAN (ir in oh TEE kan) treats some types of cancer. It works by slowing down the growth of cancer cells. This medicine may be used for other purposes; ask your health care provider or pharmacist if you have questions. COMMON BRAND NAME(S): Camptosar What should I tell my care team before I take this medication? They need to know if you have any of these conditions: Dehydration Diarrhea Infection, especially a viral infection, such as chickenpox, cold sores, herpes Liver disease Low blood cell levels (white cells, red cells, and platelets) Low levels of electrolytes, such as  calcium, magnesium, or potassium in your blood Recent or ongoing radiation An unusual or allergic reaction to irinotecan, other medications, foods, dyes, or preservatives If you or your partner are pregnant or trying to get pregnant Breast-feeding How should I use this medication? This medication is injected into a vein. It is given by your care team in a hospital or clinic setting. Talk to your care team about the use of this medication in children. Special care may be needed. Overdosage: If you think you have taken too much of this medicine contact a poison control center or emergency room at once. NOTE: This medicine is only for you. Do not share this medicine with others. What if I miss a dose? Keep appointments for follow-up doses. It is important not to miss your dose. Call your care team if you are unable to keep an appointment. What may interact with this medication? Do not take this medication with any of the  following: Cobicistat Itraconazole This medication may also interact with the following: Certain antibiotics, such as clarithromycin, rifampin, rifabutin Certain antivirals for HIV or AIDS Certain medications for fungal infections, such as ketoconazole, posaconazole, voriconazole Certain medications for seizures, such as carbamazepine, phenobarbital, phenytoin Gemfibrozil Nefazodone St. John's wort This list may not describe all possible interactions. Give your health care provider a list of all the medicines, herbs, non-prescription drugs, or dietary supplements you use. Also tell them if you smoke, drink alcohol, or use illegal drugs. Some items may interact with your medicine. What should I watch for while using this medication? Your condition will be monitored carefully while you are receiving this medication. You may need blood work while taking this medication. This medication may make you feel generally unwell. This is not uncommon as chemotherapy can affect healthy cells as well as cancer cells. Report any side effects. Continue your course of treatment even though you feel ill unless your care team tells you to stop. This medication can cause serious side effects. To reduce the risk, your care team may give you other medications to take before receiving this one. Be sure to follow the directions from your care team. This medication may affect your coordination, reaction time, or judgement. Do not drive or operate machinery until you know how this medication affects you. Sit up or stand slowly to reduce the risk of dizzy or fainting spells. Drinking alcohol with this medication can increase the risk of these side effects. This medication may increase your risk of getting an infection. Call your care team for advice if you get a fever, chills, sore throat, or other symptoms of a cold or flu. Do not treat yourself. Try to avoid being around people who are sick. Avoid taking medications that  contain aspirin, acetaminophen, ibuprofen, naproxen, or ketoprofen unless instructed by your care team. These medications may hide a fever. This medication may increase your risk to bruise or bleed. Call your care team if you notice any unusual bleeding. Be careful brushing or flossing your teeth or using a toothpick because you may get an infection or bleed more easily. If you have any dental work done, tell your dentist you are receiving this medication. Talk to your care team if you or your partner are pregnant or think either of you might be pregnant. This medication can cause serious birth defects if taken during pregnancy and for 6 months after the last dose. You will need a negative pregnancy test before starting this medication. Contraception is recommended while taking this  medication and for 6 months after the last dose. Your care team can help you find the option that works for you. Do not father a child while taking this medication and for 3 months after the last dose. Use a condom for contraception during this time period. Do not breastfeed while taking this medication and for 7 days after the last dose. This medication may cause infertility. Talk to your care team if you are concerned about your fertility. What side effects may I notice from receiving this medication? Side effects that you should report to your care team as soon as possible: Allergic reactions--skin rash, itching, hives, swelling of the face, lips, tongue, or throat Dry cough, shortness of breath or trouble breathing Increased saliva or tears, increased sweating, stomach cramping, diarrhea, small pupils, unusual weakness or fatigue, slow heartbeat Infection--fever, chills, cough, sore throat, wounds that don't heal, pain or trouble when passing urine, general feeling of discomfort or being unwell Kidney injury--decrease in the amount of urine, swelling of the ankles, hands, or feet Low red blood cell level--unusual  weakness or fatigue, dizziness, headache, trouble breathing Severe or prolonged diarrhea Unusual bruising or bleeding Side effects that usually do not require medical attention (report to your care team if they continue or are bothersome): Constipation Diarrhea Hair loss Loss of appetite Nausea Stomach pain This list may not describe all possible side effects. Call your doctor for medical advice about side effects. You may report side effects to FDA at 1-800-FDA-1088. Where should I keep my medication? This medication is given in a hospital or clinic. It will not be stored at home. NOTE: This sheet is a summary. It may not cover all possible information. If you have questions about this medicine, talk to your doctor, pharmacist, or health care provider.  2023 Elsevier/Gold Standard (2021-06-04 00:00:00)  Leucovorin Injection What is this medication? LEUCOVORIN (loo koe VOR in) prevents side effects from certain medications, such as methotrexate. It works by increasing folate levels. This helps protect healthy cells in your body. It may also be used to treat anemia caused by low levels of folate. It can also be used with fluorouracil, a type of chemotherapy, to treat colorectal cancer. It works by increasing the effects of fluorouracil in the body. This medicine may be used for other purposes; ask your health care provider or pharmacist if you have questions. What should I tell my care team before I take this medication? They need to know if you have any of these conditions: Anemia from low levels of vitamin B12 in the blood An unusual or allergic reaction to leucovorin, folic acid, other medications, foods, dyes, or preservatives Pregnant or trying to get pregnant Breastfeeding How should I use this medication? This medication is injected into a vein or a muscle. It is given by your care team in a hospital or clinic setting. Talk to your care team about the use of this medication in  children. Special care may be needed. Overdosage: If you think you have taken too much of this medicine contact a poison control center or emergency room at once. NOTE: This medicine is only for you. Do not share this medicine with others. What if I miss a dose? Keep appointments for follow-up doses. It is important not to miss your dose. Call your care team if you are unable to keep an appointment. What may interact with this medication? Capecitabine Fluorouracil Phenobarbital Phenytoin Primidone Trimethoprim;sulfamethoxazole This list may not describe all possible interactions. Give your health  care provider a list of all the medicines, herbs, non-prescription drugs, or dietary supplements you use. Also tell them if you smoke, drink alcohol, or use illegal drugs. Some items may interact with your medicine. What should I watch for while using this medication? Your condition will be monitored carefully while you are receiving this medication. This medication may increase the side effects of 5-fluorouracil. Tell your care team if you have diarrhea or mouth sores that do not get better or that get worse. What side effects may I notice from receiving this medication? Side effects that you should report to your care team as soon as possible: Allergic reactions--skin rash, itching, hives, swelling of the face, lips, tongue, or throat This list may not describe all possible side effects. Call your doctor for medical advice about side effects. You may report side effects to FDA at 1-800-FDA-1088. Where should I keep my medication? This medication is given in a hospital or clinic. It will not be stored at home. NOTE: This sheet is a summary. It may not cover all possible information. If you have questions about this medicine, talk to your doctor, pharmacist, or health care provider.  2023 Elsevier/Gold Standard (2021-06-05 00:00:00)  Fluorouracil Injection What is this medication? FLUOROURACIL  (flure oh YOOR a sil) treats some types of cancer. It works by slowing down the growth of cancer cells. This medicine may be used for other purposes; ask your health care provider or pharmacist if you have questions. COMMON BRAND NAME(S): Adrucil What should I tell my care team before I take this medication? They need to know if you have any of these conditions: Blood disorders Dihydropyrimidine dehydrogenase (DPD) deficiency Infection, such as chickenpox, cold sores, herpes Kidney disease Liver disease Poor nutrition Recent or ongoing radiation therapy An unusual or allergic reaction to fluorouracil, other medications, foods, dyes, or preservatives If you or your partner are pregnant or trying to get pregnant Breast-feeding How should I use this medication? This medication is injected into a vein. It is administered by your care team in a hospital or clinic setting. Talk to your care team about the use of this medication in children. Special care may be needed. Overdosage: If you think you have taken too much of this medicine contact a poison control center or emergency room at once. NOTE: This medicine is only for you. Do not share this medicine with others. What if I miss a dose? Keep appointments for follow-up doses. It is important not to miss your dose. Call your care team if you are unable to keep an appointment. What may interact with this medication? Do not take this medication with any of the following: Live virus vaccines This medication may also interact with the following: Medications that treat or prevent blood clots, such as warfarin, enoxaparin, dalteparin This list may not describe all possible interactions. Give your health care provider a list of all the medicines, herbs, non-prescription drugs, or dietary supplements you use. Also tell them if you smoke, drink alcohol, or use illegal drugs. Some items may interact with your medicine. What should I watch for while using  this medication? Your condition will be monitored carefully while you are receiving this medication. This medication may make you feel generally unwell. This is not uncommon as chemotherapy can affect healthy cells as well as cancer cells. Report any side effects. Continue your course of treatment even though you feel ill unless your care team tells you to stop. In some cases, you may be  given additional medications to help with side effects. Follow all directions for their use. This medication may increase your risk of getting an infection. Call your care team for advice if you get a fever, chills, sore throat, or other symptoms of a cold or flu. Do not treat yourself. Try to avoid being around people who are sick. This medication may increase your risk to bruise or bleed. Call your care team if you notice any unusual bleeding. Be careful brushing or flossing your teeth or using a toothpick because you may get an infection or bleed more easily. If you have any dental work done, tell your dentist you are receiving this medication. Avoid taking medications that contain aspirin, acetaminophen, ibuprofen, naproxen, or ketoprofen unless instructed by your care team. These medications may hide a fever. Do not treat diarrhea with over the counter products. Contact your care team if you have diarrhea that lasts more than 2 days or if it is severe and watery. This medication can make you more sensitive to the sun. Keep out of the sun. If you cannot avoid being in the sun, wear protective clothing and sunscreen. Do not use sun lamps, tanning beds, or tanning booths. Talk to your care team if you or your partner wish to become pregnant or think you might be pregnant. This medication can cause serious birth defects if taken during pregnancy and for 3 months after the last dose. A reliable form of contraception is recommended while taking this medication and for 3 months after the last dose. Talk to your care team  about effective forms of contraception. Do not father a child while taking this medication and for 3 months after the last dose. Use a condom while having sex during this time period. Do not breastfeed while taking this medication. This medication may cause infertility. Talk to your care team if you are concerned about your fertility. What side effects may I notice from receiving this medication? Side effects that you should report to your care team as soon as possible: Allergic reactions--skin rash, itching, hives, swelling of the face, lips, tongue, or throat Heart attack--pain or tightness in the chest, shoulders, arms, or jaw, nausea, shortness of breath, cold or clammy skin, feeling faint or lightheaded Heart failure--shortness of breath, swelling of the ankles, feet, or hands, sudden weight gain, unusual weakness or fatigue Heart rhythm changes--fast or irregular heartbeat, dizziness, feeling faint or lightheaded, chest pain, trouble breathing High ammonia level--unusual weakness or fatigue, confusion, loss of appetite, nausea, vomiting, seizures Infection--fever, chills, cough, sore throat, wounds that don't heal, pain or trouble when passing urine, general feeling of discomfort or being unwell Low red blood cell level--unusual weakness or fatigue, dizziness, headache, trouble breathing Pain, tingling, or numbness in the hands or feet, muscle weakness, change in vision, confusion or trouble speaking, loss of balance or coordination, trouble walking, seizures Redness, swelling, and blistering of the skin over hands and feet Severe or prolonged diarrhea Unusual bruising or bleeding Side effects that usually do not require medical attention (report to your care team if they continue or are bothersome): Dry skin Headache Increased tears Nausea Pain, redness, or swelling with sores inside the mouth or throat Sensitivity to light Vomiting This list may not describe all possible side effects.  Call your doctor for medical advice about side effects. You may report side effects to FDA at 1-800-FDA-1088. Where should I keep my medication? This medication is given in a hospital or clinic. It will not be stored  at home. NOTE: This sheet is a summary. It may not cover all possible information. If you have questions about this medicine, talk to your doctor, pharmacist, or health care provider.  2023 [employer]vier/Gold Standard (2021-06-02 00:00:00)

## 2021-09-29 NOTE — Progress Notes (Signed)
[place] OFFICE PROGRESS NOTE   Diagnosis: Colon cancer   INTERVAL HISTORY:   Craig Best completed another cycle of FOLFIRI on 09/15/2021.  He reports mild diarrhea a few days following chemotherapy.  He had malaise and nausea during the week following chemotherapy.  The nausea was relieved with Compazine.  No vomiting.  No bleeding or symptom of thrombosis.  He continues working.  Good appetite.  Objective:  Vital signs in last 24 hours:  Blood pressure (!) 141/69, pulse 63, temperature 98.2 F (36.8 C), temperature source Oral, resp. rate 18, height '5\' 8"'  (1.727 m), weight 192 lb 3.2 oz (87.2 kg), SpO2 100 %.    HEENT: No thrush or ulcers Resp: Lungs clear bilaterally with slight decrease in breath sounds at the left lower posterior chest, no respiratory distress Cardio: Regular rate and rhythm GI: No hepatosplenomegaly, left lower quadrant colostomy with brown stool Vascular: No leg edema  Portacath/PICC-without erythema  Lab Results:  Lab Results  Component Value Date   WBC 6.3 09/29/2021   HGB 14.4 09/29/2021   HCT 41.4 09/29/2021   MCV 91.6 09/29/2021   PLT 287 09/29/2021   NEUTROABS 3.4 09/29/2021    CMP  Lab Results  Component Value Date   NA 140 09/15/2021   K 3.8 09/15/2021   CL 102 09/15/2021   CO2 28 09/15/2021   GLUCOSE 100 (H) 09/15/2021   BUN 14 09/15/2021   CREATININE 0.85 09/15/2021   CALCIUM 9.2 09/15/2021   PROT 7.3 09/15/2021   ALBUMIN 3.9 09/15/2021   AST 16 09/15/2021   ALT 14 09/15/2021   ALKPHOS 67 09/15/2021   BILITOT 0.4 09/15/2021   GFRNONAA >60 09/15/2021    Lab Results  Component Value Date   CEA1 63.2 (H) 09/19/2020   CEA 21.08 (H) 09/29/2021     Medications: I have reviewed the patient's current medications.   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), status post a sigmoid colectomy and partial bladder resection 09/19/2020 2 separate sigmoid colon tumors, 2.5 cm apart, 1/13 lymph  nodes positive, tumor extends to adherent soft tissue of the bladder, MSS, normal mismatch repair protein expression tumor mutation burden 0, K-ras G12D CT abdomen/pelvis 09/19/2020-sigmoid colon mass with colonic obstruction, 6 mm right lower lobe nodule CT chest 09/22/2020-multiple bilateral lung nodules consistent with metastases Elevated preoperative CEA Cycle 1 FOLFOX 10/28/2020 Cycle 2 FOLFOX 11/11/2020 Cycle 3 FOLFOX 11/25/2020 Cycle 4 FOLFOX 12/09/2020 Cycle 5 FOLFOX 12/23/2020 CT chest 12/25/2020-unchanged lung nodules, 2 cm subpleural nodule in the left posterior chest more prominent-loculated fluid? Cycle 6 FOLFOX 02/03/2021-5-FU dose reduced and bolus eliminated secondary to diarrhea Cycle 7 FOLFOX 02/17/2021 Cycle 8 FOLFOX 03/03/2021 PET 03/17/2021 pulmonary nodules without significant change, 10 mm nodule in right middle lobe with mild FDG uptake, mildly hypermetabolic pleural nodule in the lower left hemothorax, new clustered nodularity in the apical and posterior right upper lobe-likely infectious CTs 07/23/2021-new left pleural effusion with multiple pleural nodules, increased size of several pulmonary nodules, stable left paracolic gutter nodule, morphologic features suggestive of early cirrhosis Thoracentesis 07/24/2021-1200 cc of fluid removed, rare atypical cells present Thoracentesis 627 2023-1.5 L of fluid removed, atypical cells present, acute inflammation and blood Thoracentesis 08/14/2021- 1.2 L of fluid removed, adenocarcinoma compatible with metastatic colon cancer Thoracentesis 08/21/2021-adenocarcinoma Cycle 1 FOLFIRI/bevacizumab 09/01/2021 Cycle 2 FOLFIRI/bevacizumab 09/15/2021 Cycle 3 FOLFIRI/bevacizumab 09/29/2021-Decadron prophylaxis added for delayed nausea  2.  Port-A-Cath placement-Dr. 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  Has  completed 2 cycles of FOLFIRI/bevacizumab.  He is tolerated treatment well aside from delayed malaise and nausea.  He will complete cycle 3 today.  Home Decadron prophylaxis will be added for delayed nausea.  Craig Best will return for an office visit and cycle 4 FOLFIRI/bevacizumab in 2 weeks.  He will undergo restaging CTs after cycle 5.  Betsy Coder, MD  09/29/2021  9:18 AM

## 2021-10-01 ENCOUNTER — Inpatient Hospital Stay: Payer: Medicare Other

## 2021-10-01 VITALS — BP 142/68 | HR 59 | Temp 98.2°F | Resp 20

## 2021-10-01 DIAGNOSIS — C7802 Secondary malignant neoplasm of left lung: Secondary | ICD-10-CM | POA: Diagnosis not present

## 2021-10-01 DIAGNOSIS — C187 Malignant neoplasm of sigmoid colon: Secondary | ICD-10-CM

## 2021-10-01 DIAGNOSIS — C7801 Secondary malignant neoplasm of right lung: Secondary | ICD-10-CM | POA: Diagnosis not present

## 2021-10-01 DIAGNOSIS — Z5111 Encounter for antineoplastic chemotherapy: Secondary | ICD-10-CM | POA: Diagnosis not present

## 2021-10-01 DIAGNOSIS — Z452 Encounter for adjustment and management of vascular access device: Secondary | ICD-10-CM | POA: Diagnosis not present

## 2021-10-01 DIAGNOSIS — Z5112 Encounter for antineoplastic immunotherapy: Secondary | ICD-10-CM | POA: Diagnosis not present

## 2021-10-01 MED ORDER — HEPARIN SOD (PORK) LOCK FLUSH 100 UNIT/ML IV SOLN
500.0000 [IU] | Freq: Once | INTRAVENOUS | Status: AC | PRN
  Administered 2021-10-01: 500 [IU]

## 2021-10-01 MED ORDER — SODIUM CHLORIDE 0.9% FLUSH
10.0000 mL | INTRAVENOUS | Status: DC | PRN
  Administered 2021-10-01: 10 mL

## 2021-10-01 NOTE — Patient Instructions (Signed)

## 2021-10-12 ENCOUNTER — Other Ambulatory Visit: Payer: Self-pay | Admitting: Oncology

## 2021-10-13 ENCOUNTER — Inpatient Hospital Stay: Payer: Medicare Other

## 2021-10-13 ENCOUNTER — Encounter: Payer: Self-pay | Admitting: *Deleted

## 2021-10-13 ENCOUNTER — Inpatient Hospital Stay: Payer: Medicare Other | Admitting: Oncology

## 2021-10-13 ENCOUNTER — Inpatient Hospital Stay: Payer: Medicare Other | Attending: Oncology

## 2021-10-13 VITALS — BP 135/67 | HR 60 | Resp 18

## 2021-10-13 VITALS — BP 135/78 | HR 82 | Temp 98.4°F | Resp 20 | Wt 191.0 lb

## 2021-10-13 DIAGNOSIS — Z452 Encounter for adjustment and management of vascular access device: Secondary | ICD-10-CM | POA: Insufficient documentation

## 2021-10-13 DIAGNOSIS — C187 Malignant neoplasm of sigmoid colon: Secondary | ICD-10-CM | POA: Insufficient documentation

## 2021-10-13 DIAGNOSIS — Z5112 Encounter for antineoplastic immunotherapy: Secondary | ICD-10-CM | POA: Diagnosis not present

## 2021-10-13 DIAGNOSIS — C7801 Secondary malignant neoplasm of right lung: Secondary | ICD-10-CM | POA: Diagnosis not present

## 2021-10-13 DIAGNOSIS — C7802 Secondary malignant neoplasm of left lung: Secondary | ICD-10-CM | POA: Diagnosis not present

## 2021-10-13 DIAGNOSIS — Z5111 Encounter for antineoplastic chemotherapy: Secondary | ICD-10-CM | POA: Insufficient documentation

## 2021-10-13 LAB — CBC WITH DIFFERENTIAL (CANCER CENTER ONLY)
Abs Immature Granulocytes: 0.03 10*3/uL (ref 0.00–0.07)
Basophils Absolute: 0.1 10*3/uL (ref 0.0–0.1)
Basophils Relative: 1 %
Eosinophils Absolute: 0.2 10*3/uL (ref 0.0–0.5)
Eosinophils Relative: 3 %
HCT: 44.4 % (ref 39.0–52.0)
Hemoglobin: 15.4 g/dL (ref 13.0–17.0)
Immature Granulocytes: 0 %
Lymphocytes Relative: 23 %
Lymphs Abs: 1.9 10*3/uL (ref 0.7–4.0)
MCH: 32.2 pg (ref 26.0–34.0)
MCHC: 34.7 g/dL (ref 30.0–36.0)
MCV: 92.7 fL (ref 80.0–100.0)
Monocytes Absolute: 1 10*3/uL (ref 0.1–1.0)
Monocytes Relative: 12 %
Neutro Abs: 5.1 10*3/uL (ref 1.7–7.7)
Neutrophils Relative %: 61 %
Platelet Count: 264 10*3/uL (ref 150–400)
RBC: 4.79 MIL/uL (ref 4.22–5.81)
RDW: 16.1 % — ABNORMAL HIGH (ref 11.5–15.5)
WBC Count: 8.4 10*3/uL (ref 4.0–10.5)
nRBC: 0 % (ref 0.0–0.2)

## 2021-10-13 LAB — CMP (CANCER CENTER ONLY)
ALT: 23 U/L (ref 0–44)
AST: 20 U/L (ref 15–41)
Albumin: 4.2 g/dL (ref 3.5–5.0)
Alkaline Phosphatase: 69 U/L (ref 38–126)
Anion gap: 7 (ref 5–15)
BUN: 16 mg/dL (ref 8–23)
CO2: 29 mmol/L (ref 22–32)
Calcium: 9.4 mg/dL (ref 8.9–10.3)
Chloride: 100 mmol/L (ref 98–111)
Creatinine: 0.82 mg/dL (ref 0.61–1.24)
GFR, Estimated: >60 mL/min (ref >60)
Glucose, Bld: 111 mg/dL — ABNORMAL HIGH (ref 70–99)
Potassium: 4.1 mmol/L (ref 3.5–5.1)
Sodium: 136 mmol/L (ref 135–145)
Total Bilirubin: 0.6 mg/dL (ref 0.3–1.2)
Total Protein: 7.5 g/dL (ref 6.5–8.1)

## 2021-10-13 LAB — CEA (ACCESS): CEA (CHCC): 24.4 ng/mL — ABNORMAL HIGH (ref 0.00–5.00)

## 2021-10-13 LAB — TOTAL PROTEIN, URINE DIPSTICK: Protein, ur: NEGATIVE mg/dL

## 2021-10-13 MED ORDER — SODIUM CHLORIDE 0.9 % IV SOLN: Freq: Once | INTRAVENOUS | Status: AC

## 2021-10-13 MED ORDER — SODIUM CHLORIDE 0.9 % IV SOLN
2000.0000 mg/m2 | INTRAVENOUS | Status: DC
  Administered 2021-10-13: 4100 mg via INTRAVENOUS
  Filled 2021-10-13: qty 82

## 2021-10-13 MED ORDER — SODIUM CHLORIDE 0.9 % IV SOLN
300.0000 mg/m2 | Freq: Once | INTRAVENOUS | Status: AC
  Administered 2021-10-13: 612 mg via INTRAVENOUS
  Filled 2021-10-13: qty 25

## 2021-10-13 MED ORDER — ATROPINE SULFATE 1 MG/ML IV SOLN
0.5000 mg | Freq: Once | INTRAVENOUS | Status: AC | PRN
  Administered 2021-10-13: 0.5 mg via INTRAVENOUS
  Filled 2021-10-13: qty 1

## 2021-10-13 MED ORDER — SODIUM CHLORIDE 0.9 % IV SOLN
5.0000 mg/kg | Freq: Once | INTRAVENOUS | Status: AC
  Administered 2021-10-13: 400 mg via INTRAVENOUS
  Filled 2021-10-13: qty 16

## 2021-10-13 MED ORDER — PALONOSETRON HCL INJECTION 0.25 MG/5ML
0.2500 mg | Freq: Once | INTRAVENOUS | Status: AC
  Administered 2021-10-13: 0.25 mg via INTRAVENOUS
  Filled 2021-10-13: qty 5

## 2021-10-13 MED ORDER — SODIUM CHLORIDE 0.9 % IV SOLN
10.0000 mg | Freq: Once | INTRAVENOUS | Status: AC
  Administered 2021-10-13: 10 mg via INTRAVENOUS
  Filled 2021-10-13: qty 10

## 2021-10-13 MED ORDER — SODIUM CHLORIDE 0.9 % IV SOLN
180.0000 mg/m2 | Freq: Once | INTRAVENOUS | Status: AC
  Administered 2021-10-13: 360 mg via INTRAVENOUS
  Filled 2021-10-13: qty 15

## 2021-10-13 MED ORDER — FLUOROURACIL CHEMO INJECTION 2.5 GM/50ML
300.0000 mg/m2 | Freq: Once | INTRAVENOUS | Status: AC
  Administered 2021-10-13: 600 mg via INTRAVENOUS
  Filled 2021-10-13: qty 12

## 2021-10-13 MED ORDER — SODIUM CHLORIDE 0.9 % IV SOLN
300.0000 mg/m2 | Freq: Once | INTRAVENOUS | Status: DC
  Filled 2021-10-13: qty 30.6

## 2021-10-13 NOTE — Progress Notes (Unsigned)
Patient seen by Dr. Sherrill today ? ?Vitals are within treatment parameters. ? ?Labs reviewed by Dr. Sherrill and are within treatment parameters. ? ?Per physician team, patient is ready for treatment and there are NO modifications to the treatment plan.  ?

## 2021-10-13 NOTE — Progress Notes (Signed)
[place] OFFICE PROGRESS NOTE   Diagnosis: Colon cancer  INTERVAL HISTORY:   Mr. Mohl completed another cycle of FOLFIRI/Avastin on 09/29/2021.  He reports less nausea following this cycle of chemotherapy.  He took Decadron prophylaxis once daily for 3 days.  He feels well.  No dyspnea.  He is working.  No bleeding or symptom of thrombosis.  Objective:  Vital signs in last 24 hours:  Blood pressure 135/78, pulse 82, temperature 98.4 F (36.9 C), temperature source Oral, resp. rate 20, weight 191 lb (86.6 kg), SpO2 98 %.    HEENT: No thrush or ulcers Resp: Lungs clear bilaterally, slight decrease in breath sounds at the left lower chest, no respiratory distress Cardio: Regular rate and rhythm GI: No hepatomegaly, left lower quadrant colostomy Vascular: No leg edema  Skin: Palms without erythema  Portacath/PICC-without erythema  Lab Results:  Lab Results  Component Value Date   WBC 8.4 10/13/2021   HGB 15.4 10/13/2021   HCT 44.4 10/13/2021   MCV 92.7 10/13/2021   PLT 264 10/13/2021   NEUTROABS 5.1 10/13/2021    CMP  Lab Results  Component Value Date   NA 136 09/29/2021   K 3.8 09/29/2021   CL 100 09/29/2021   CO2 28 09/29/2021   GLUCOSE 109 (H) 09/29/2021   BUN 16 09/29/2021   CREATININE 0.83 09/29/2021   CALCIUM 9.0 09/29/2021   PROT 7.2 09/29/2021   ALBUMIN 4.0 09/29/2021   AST 16 09/29/2021   ALT 15 09/29/2021   ALKPHOS 71 09/29/2021   BILITOT 0.4 09/29/2021   GFRNONAA >60 09/29/2021    Lab Results  Component Value Date   CEA1 63.2 (H) 09/19/2020   CEA 21.08 (H) 09/29/2021    Medications: I have reviewed the patient's current medications.   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), status post a sigmoid colectomy and partial bladder resection 09/19/2020 2 separate sigmoid colon tumors, 2.5 cm apart, 1/13 lymph nodes positive, tumor extends to adherent soft tissue of the bladder, MSS, normal mismatch repair  protein expression tumor mutation burden 0, K-ras G12D CT abdomen/pelvis 09/19/2020-sigmoid colon mass with colonic obstruction, 6 mm right lower lobe nodule CT chest 09/22/2020-multiple bilateral lung nodules consistent with metastases Elevated preoperative CEA Cycle 1 FOLFOX 10/28/2020 Cycle 2 FOLFOX 11/11/2020 Cycle 3 FOLFOX 11/25/2020 Cycle 4 FOLFOX 12/09/2020 Cycle 5 FOLFOX 12/23/2020 CT chest 12/25/2020-unchanged lung nodules, 2 cm subpleural nodule in the left posterior chest more prominent-loculated fluid? Cycle 6 FOLFOX 02/03/2021-5-FU dose reduced and bolus eliminated secondary to diarrhea Cycle 7 FOLFOX 02/17/2021 Cycle 8 FOLFOX 03/03/2021 PET 03/17/2021 pulmonary nodules without significant change, 10 mm nodule in right middle lobe with mild FDG uptake, mildly hypermetabolic pleural nodule in the lower left hemothorax, new clustered nodularity in the apical and posterior right upper lobe-likely infectious CTs 07/23/2021-new left pleural effusion with multiple pleural nodules, increased size of several pulmonary nodules, stable left paracolic gutter nodule, morphologic features suggestive of early cirrhosis Thoracentesis 07/24/2021-1200 cc of fluid removed, rare atypical cells present Thoracentesis 627 2023-1.5 L of fluid removed, atypical cells present, acute inflammation and blood Thoracentesis 08/14/2021- 1.2 L of fluid removed, adenocarcinoma compatible with metastatic colon cancer Thoracentesis 08/21/2021-adenocarcinoma Cycle 1 FOLFIRI/bevacizumab 09/01/2021 Cycle 2 FOLFIRI/bevacizumab 09/15/2021 Cycle 3 FOLFIRI/bevacizumab 09/29/2021-Decadron prophylaxis added for delayed nausea Cycle 4 FOLFIRI/bevacizumab 10/13/2021  2.  Port-A-Cath placement-Dr. 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  He has tolerated the treatment well.   He will complete cycle 4 today.  He will undergo a restaging chest CT after cycle 5.  [name] will return for an office visit and chemotherapy in 2 weeks.  He will take Decadron prophylaxis after chemotherapy.  Betsy Coder, MD  10/13/2021  9:19 AM

## 2021-10-13 NOTE — Patient Instructions (Signed)
[facility]   Discharge Instructions: Thank you for choosing [facility] to provide your oncology and hematology care.   If you have a lab appointment with the [facility], please go directly to the [facility] and check in at the registration area.   Wear comfortable clothing and clothing appropriate for easy access to any Portacath or PICC line.   We strive to give you quality time with your provider. You may need to reschedule your appointment if you arrive late (15 or more minutes).  Arriving late affects you and other patients whose appointments are after yours.  Also, if you miss three or more appointments without notifying the office, you may be dismissed from the clinic at the provider's discretion.      For prescription refill requests, have your pharmacy contact our office and allow 72 hours for refills to be completed.    Today you received the following chemotherapy and/or immunotherapy agents Bevacizumab-bvzr (ZIRABEV), Irinotecan (CAMPTOSAR), Leucovorin & Flourouracil (ADRUCIL).      To help prevent nausea and vomiting after your treatment, we encourage you to take your nausea medication as directed.  BELOW ARE SYMPTOMS THAT SHOULD BE REPORTED IMMEDIATELY: *FEVER GREATER THAN 100.4 F (38 C) OR HIGHER *CHILLS OR SWEATING *NAUSEA AND VOMITING THAT IS NOT CONTROLLED WITH YOUR NAUSEA MEDICATION *UNUSUAL SHORTNESS OF BREATH *UNUSUAL BRUISING OR BLEEDING *URINARY PROBLEMS (pain or burning when urinating, or frequent urination) *BOWEL PROBLEMS (unusual diarrhea, constipation, pain near the anus) TENDERNESS IN MOUTH AND THROAT WITH OR WITHOUT PRESENCE OF ULCERS (sore throat, sores in mouth, or a toothache) UNUSUAL RASH, SWELLING OR PAIN  UNUSUAL VAGINAL DISCHARGE OR ITCHING   Items with * indicate a potential emergency and should be followed up as soon as possible or go to the Emergency Department if any problems should occur.  Please  show the CHEMOTHERAPY ALERT CARD or IMMUNOTHERAPY ALERT CARD at check-in to the Emergency Department and triage nurse.  Should you have questions after your visit or need to cancel or reschedule your appointment, please contact [facility]  Dept: [phone]  and follow the prompts.  Office hours are 8:00 a.m. to 4:30 p.m. Monday - Friday. Please note that voicemails left after 4:00 p.m. may not be returned until the following business day.  We are closed weekends and major holidays. You have access to a nurse at all times for urgent questions. Please call the main number to the clinic Dept: [phone] and follow the prompts.   For any non-urgent questions, you may also contact your provider using MyChart. We now offer e-Visits for anyone 2 and older to request care online for non-urgent symptoms. For details visit [email or web address].   Also download the MyChart app! Go to the app store, search "MyChart", open the app, select [facility], and log in with your MyChart username and password.  Masks are optional in the cancer centers. If you would like for your care team to wear a mask while they are taking care of you, please let them know. You may have one support person who is at least 79 years old accompany you for your appointments.  Bevacizumab Injection What is this medication? BEVACIZUMAB (be va SIZ yoo mab) treats some types of cancer. It works by blocking a protein that causes cancer cells to grow and multiply. This helps to slow or stop the spread of cancer cells. It is a monoclonal antibody. This medicine may be used for  other purposes; ask your health care provider or pharmacist if you have questions. COMMON BRAND NAME(S): Alymsys, Avastin, MVASI, Noah Charon What should I tell my care team before I take this medication? They need to know if you have any of these conditions: Blood clots Coughing up blood Having or recent surgery Heart failure High blood  pressure History of a connection between 2 or more body parts that do not usually connect (fistula) History of a tear in your stomach or intestines Protein in your urine An unusual or allergic reaction to bevacizumab, other medications, foods, dyes, or preservatives Pregnant or trying to get pregnant Breast-feeding How should I use this medication? This medication is injected into a vein. It is given by your care team in a hospital or clinic setting. Talk to your care team the use of this medication in children. Special care may be needed. Overdosage: If you think you have taken too much of this medicine contact a poison control center or emergency room at once. NOTE: This medicine is only for you. Do not share this medicine with others. What if I miss a dose? Keep appointments for follow-up doses. It is important not to miss your dose. Call your care team if you are unable to keep an appointment. What may interact with this medication? Interactions are not expected. This list may not describe all possible interactions. Give your health care provider a list of all the medicines, herbs, non-prescription drugs, or dietary supplements you use. Also tell them if you smoke, drink alcohol, or use illegal drugs. Some items may interact with your medicine. What should I watch for while using this medication? Your condition will be monitored carefully while you are receiving this medication. You may need blood work while taking this medication. This medication may make you feel generally unwell. This is not uncommon as chemotherapy can affect healthy cells as well as cancer cells. Report any side effects. Continue your course of treatment even though you feel ill unless your care team tells you to stop. This medication may increase your risk to bruise or bleed. Call your care team if you notice any unusual bleeding. Before having surgery, talk to your care team to make sure it is ok. This medication can  increase the risk of poor healing of your surgical site or wound. You will need to stop this medication for 28 days before surgery. After surgery, wait at least 28 days before restarting this medication. Make sure the surgical site or wound is healed enough before restarting this medication. Talk to your care team if questions. Talk to your care team if you may be pregnant. Serious birth defects can occur if you take this medication during pregnancy and for 6 months after the last dose. Contraception is recommended while taking this medication and for 6 months after the last dose. Your care team can help you find the option that works for you. Do not breastfeed while taking this medication and for 6 months after the last dose. This medication can cause infertility. Talk to your care team if you are concerned about your fertility. What side effects may I notice from receiving this medication? Side effects that you should report to your care team as soon as possible: Allergic reactions--skin rash, itching, hives, swelling of the face, lips, tongue, or throat Bleeding--bloody or black, tar-like stools, vomiting blood or brown material that looks like coffee grounds, red or dark brown urine, small red or purple spots on skin, unusual bruising or bleeding  Blood clot--pain, swelling, or warmth in the leg, shortness of breath, chest pain Heart attack--pain or tightness in the chest, shoulders, arms, or jaw, nausea, shortness of breath, cold or clammy skin, feeling faint or lightheaded Heart failure--shortness of breath, swelling of the ankles, feet, or hands, sudden weight gain, unusual weakness or fatigue Increase in blood pressure Infection--fever, chills, cough, sore throat, wounds that don't heal, pain or trouble when passing urine, general feeling of discomfort or being unwell Infusion reactions--chest pain, shortness of breath or trouble breathing, feeling faint or lightheaded Kidney injury--decrease in  the amount of urine, swelling of the ankles, hands, or feet Stomach pain that is severe, does not go away, or gets worse Stroke--sudden numbness or weakness of the face, arm, or leg, trouble speaking, confusion, trouble walking, loss of balance or coordination, dizziness, severe headache, change in vision Sudden and severe headache, confusion, change in vision, seizures, which may be signs of posterior reversible encephalopathy syndrome (PRES) Side effects that usually do not require medical attention (report to your care team if they continue or are bothersome): Back pain Change in taste Diarrhea Dry skin Increased tears Nosebleed This list may not describe all possible side effects. Call your doctor for medical advice about side effects. You may report side effects to FDA at 1-800-FDA-1088. Where should I keep my medication? This medication is given in a hospital or clinic. It will not be stored at home. NOTE: This sheet is a summary. It may not cover all possible information. If you have questions about this medicine, talk to your doctor, pharmacist, or health care provider.  2023 Elsevier/Gold Standard (2021-06-09 00:00:00)  Irinotecan Injection What is this medication? IRINOTECAN (ir in oh TEE kan) treats some types of cancer. It works by slowing down the growth of cancer cells. This medicine may be used for other purposes; ask your health care provider or pharmacist if you have questions. COMMON BRAND NAME(S): Camptosar What should I tell my care team before I take this medication? They need to know if you have any of these conditions: Dehydration Diarrhea Infection, especially a viral infection, such as chickenpox, cold sores, herpes Liver disease Low blood cell levels (white cells, red cells, and platelets) Low levels of electrolytes, such as calcium, magnesium, or potassium in your blood Recent or ongoing radiation An unusual or allergic reaction to irinotecan, other  medications, foods, dyes, or preservatives If you or your partner are pregnant or trying to get pregnant Breast-feeding How should I use this medication? This medication is injected into a vein. It is given by your care team in a hospital or clinic setting. Talk to your care team about the use of this medication in children. Special care may be needed. Overdosage: If you think you have taken too much of this medicine contact a poison control center or emergency room at once. NOTE: This medicine is only for you. Do not share this medicine with others. What if I miss a dose? Keep appointments for follow-up doses. It is important not to miss your dose. Call your care team if you are unable to keep an appointment. What may interact with this medication? Do not take this medication with any of the following: Cobicistat Itraconazole This medication may also interact with the following: Certain antibiotics, such as clarithromycin, rifampin, rifabutin Certain antivirals for HIV or AIDS Certain medications for fungal infections, such as ketoconazole, posaconazole, voriconazole Certain medications for seizures, such as carbamazepine, phenobarbital, phenytoin Gemfibrozil Nefazodone St. John's wort This list may  not describe all possible interactions. Give your health care provider a list of all the medicines, herbs, non-prescription drugs, or dietary supplements you use. Also tell them if you smoke, drink alcohol, or use illegal drugs. Some items may interact with your medicine. What should I watch for while using this medication? Your condition will be monitored carefully while you are receiving this medication. You may need blood work while taking this medication. This medication may make you feel generally unwell. This is not uncommon as chemotherapy can affect healthy cells as well as cancer cells. Report any side effects. Continue your course of treatment even though you feel ill unless your care  team tells you to stop. This medication can cause serious side effects. To reduce the risk, your care team may give you other medications to take before receiving this one. Be sure to follow the directions from your care team. This medication may affect your coordination, reaction time, or judgement. Do not drive or operate machinery until you know how this medication affects you. Sit up or stand slowly to reduce the risk of dizzy or fainting spells. Drinking alcohol with this medication can increase the risk of these side effects. This medication may increase your risk of getting an infection. Call your care team for advice if you get a fever, chills, sore throat, or other symptoms of a cold or flu. Do not treat yourself. Try to avoid being around people who are sick. Avoid taking medications that contain aspirin, acetaminophen, ibuprofen, naproxen, or ketoprofen unless instructed by your care team. These medications may hide a fever. This medication may increase your risk to bruise or bleed. Call your care team if you notice any unusual bleeding. Be careful brushing or flossing your teeth or using a toothpick because you may get an infection or bleed more easily. If you have any dental work done, tell your dentist you are receiving this medication. Talk to your care team if you or your partner are pregnant or think either of you might be pregnant. This medication can cause serious birth defects if taken during pregnancy and for 6 months after the last dose. You will need a negative pregnancy test before starting this medication. Contraception is recommended while taking this medication and for 6 months after the last dose. Your care team can help you find the option that works for you. Do not father a child while taking this medication and for 3 months after the last dose. Use a condom for contraception during this time period. Do not breastfeed while taking this medication and for 7 days after the last  dose. This medication may cause infertility. Talk to your care team if you are concerned about your fertility. What side effects may I notice from receiving this medication? Side effects that you should report to your care team as soon as possible: Allergic reactions--skin rash, itching, hives, swelling of the face, lips, tongue, or throat Dry cough, shortness of breath or trouble breathing Increased saliva or tears, increased sweating, stomach cramping, diarrhea, small pupils, unusual weakness or fatigue, slow heartbeat Infection--fever, chills, cough, sore throat, wounds that don't heal, pain or trouble when passing urine, general feeling of discomfort or being unwell Kidney injury--decrease in the amount of urine, swelling of the ankles, hands, or feet Low red blood cell level--unusual weakness or fatigue, dizziness, headache, trouble breathing Severe or prolonged diarrhea Unusual bruising or bleeding Side effects that usually do not require medical attention (report to your care team if they  continue or are bothersome): Constipation Diarrhea Hair loss Loss of appetite Nausea Stomach pain This list may not describe all possible side effects. Call your doctor for medical advice about side effects. You may report side effects to FDA at 1-800-FDA-1088. Where should I keep my medication? This medication is given in a hospital or clinic. It will not be stored at home. NOTE: This sheet is a summary. It may not cover all possible information. If you have questions about this medicine, talk to your doctor, pharmacist, or health care provider.  2023 Elsevier/Gold Standard (2021-06-04 00:00:00)  Leucovorin Injection What is this medication? LEUCOVORIN (loo koe VOR in) prevents side effects from certain medications, such as methotrexate. It works by increasing folate levels. This helps protect healthy cells in your body. It may also be used to treat anemia caused by low levels of folate. It can  also be used with fluorouracil, a type of chemotherapy, to treat colorectal cancer. It works by increasing the effects of fluorouracil in the body. This medicine may be used for other purposes; ask your health care provider or pharmacist if you have questions. What should I tell my care team before I take this medication? They need to know if you have any of these conditions: Anemia from low levels of vitamin B12 in the blood An unusual or allergic reaction to leucovorin, folic acid, other medications, foods, dyes, or preservatives Pregnant or trying to get pregnant Breastfeeding How should I use this medication? This medication is injected into a vein or a muscle. It is given by your care team in a hospital or clinic setting. Talk to your care team about the use of this medication in children. Special care may be needed. Overdosage: If you think you have taken too much of this medicine contact a poison control center or emergency room at once. NOTE: This medicine is only for you. Do not share this medicine with others. What if I miss a dose? Keep appointments for follow-up doses. It is important not to miss your dose. Call your care team if you are unable to keep an appointment. What may interact with this medication? Capecitabine Fluorouracil Phenobarbital Phenytoin Primidone Trimethoprim;sulfamethoxazole This list may not describe all possible interactions. Give your health care provider a list of all the medicines, herbs, non-prescription drugs, or dietary supplements you use. Also tell them if you smoke, drink alcohol, or use illegal drugs. Some items may interact with your medicine. What should I watch for while using this medication? Your condition will be monitored carefully while you are receiving this medication. This medication may increase the side effects of 5-fluorouracil. Tell your care team if you have diarrhea or mouth sores that do not get better or that get worse. What  side effects may I notice from receiving this medication? Side effects that you should report to your care team as soon as possible: Allergic reactions--skin rash, itching, hives, swelling of the face, lips, tongue, or throat This list may not describe all possible side effects. Call your doctor for medical advice about side effects. You may report side effects to FDA at 1-800-FDA-1088. Where should I keep my medication? This medication is given in a hospital or clinic. It will not be stored at home. NOTE: This sheet is a summary. It may not cover all possible information. If you have questions about this medicine, talk to your doctor, pharmacist, or health care provider.  2023 Elsevier/Gold Standard (2021-06-05 00:00:00)  Fluorouracil Injection What is this medication? FLUOROURACIL (  flure oh YOOR a sil) treats some types of cancer. It works by slowing down the growth of cancer cells. This medicine may be used for other purposes; ask your health care provider or pharmacist if you have questions. COMMON BRAND NAME(S): Adrucil What should I tell my care team before I take this medication? They need to know if you have any of these conditions: Blood disorders Dihydropyrimidine dehydrogenase (DPD) deficiency Infection, such as chickenpox, cold sores, herpes Kidney disease Liver disease Poor nutrition Recent or ongoing radiation therapy An unusual or allergic reaction to fluorouracil, other medications, foods, dyes, or preservatives If you or your partner are pregnant or trying to get pregnant Breast-feeding How should I use this medication? This medication is injected into a vein. It is administered by your care team in a hospital or clinic setting. Talk to your care team about the use of this medication in children. Special care may be needed. Overdosage: If you think you have taken too much of this medicine contact a poison control center or emergency room at once. NOTE: This medicine is  only for you. Do not share this medicine with others. What if I miss a dose? Keep appointments for follow-up doses. It is important not to miss your dose. Call your care team if you are unable to keep an appointment. What may interact with this medication? Do not take this medication with any of the following: Live virus vaccines This medication may also interact with the following: Medications that treat or prevent blood clots, such as warfarin, enoxaparin, dalteparin This list may not describe all possible interactions. Give your health care provider a list of all the medicines, herbs, non-prescription drugs, or dietary supplements you use. Also tell them if you smoke, drink alcohol, or use illegal drugs. Some items may interact with your medicine. What should I watch for while using this medication? Your condition will be monitored carefully while you are receiving this medication. This medication may make you feel generally unwell. This is not uncommon as chemotherapy can affect healthy cells as well as cancer cells. Report any side effects. Continue your course of treatment even though you feel ill unless your care team tells you to stop. In some cases, you may be given additional medications to help with side effects. Follow all directions for their use. This medication may increase your risk of getting an infection. Call your care team for advice if you get a fever, chills, sore throat, or other symptoms of a cold or flu. Do not treat yourself. Try to avoid being around people who are sick. This medication may increase your risk to bruise or bleed. Call your care team if you notice any unusual bleeding. Be careful brushing or flossing your teeth or using a toothpick because you may get an infection or bleed more easily. If you have any dental work done, tell your dentist you are receiving this medication. Avoid taking medications that contain aspirin, acetaminophen, ibuprofen, naproxen, or  ketoprofen unless instructed by your care team. These medications may hide a fever. Do not treat diarrhea with over the counter products. Contact your care team if you have diarrhea that lasts more than 2 days or if it is severe and watery. This medication can make you more sensitive to the sun. Keep out of the sun. If you cannot avoid being in the sun, wear protective clothing and sunscreen. Do not use sun lamps, tanning beds, or tanning booths. Talk to your care team if you or  your partner wish to become pregnant or think you might be pregnant. This medication can cause serious birth defects if taken during pregnancy and for 3 months after the last dose. A reliable form of contraception is recommended while taking this medication and for 3 months after the last dose. Talk to your care team about effective forms of contraception. Do not father a child while taking this medication and for 3 months after the last dose. Use a condom while having sex during this time period. Do not breastfeed while taking this medication. This medication may cause infertility. Talk to your care team if you are concerned about your fertility. What side effects may I notice from receiving this medication? Side effects that you should report to your care team as soon as possible: Allergic reactions--skin rash, itching, hives, swelling of the face, lips, tongue, or throat Heart attack--pain or tightness in the chest, shoulders, arms, or jaw, nausea, shortness of breath, cold or clammy skin, feeling faint or lightheaded Heart failure--shortness of breath, swelling of the ankles, feet, or hands, sudden weight gain, unusual weakness or fatigue Heart rhythm changes--fast or irregular heartbeat, dizziness, feeling faint or lightheaded, chest pain, trouble breathing High ammonia level--unusual weakness or fatigue, confusion, loss of appetite, nausea, vomiting, seizures Infection--fever, chills, cough, sore throat, wounds that don't  heal, pain or trouble when passing urine, general feeling of discomfort or being unwell Low red blood cell level--unusual weakness or fatigue, dizziness, headache, trouble breathing Pain, tingling, or numbness in the hands or feet, muscle weakness, change in vision, confusion or trouble speaking, loss of balance or coordination, trouble walking, seizures Redness, swelling, and blistering of the skin over hands and feet Severe or prolonged diarrhea Unusual bruising or bleeding Side effects that usually do not require medical attention (report to your care team if they continue or are bothersome): Dry skin Headache Increased tears Nausea Pain, redness, or swelling with sores inside the mouth or throat Sensitivity to light Vomiting This list may not describe all possible side effects. Call your doctor for medical advice about side effects. You may report side effects to FDA at 1-800-FDA-1088. Where should I keep my medication? This medication is given in a hospital or clinic. It will not be stored at home. NOTE: This sheet is a summary. It may not cover all possible information. If you have questions about this medicine, talk to your doctor, pharmacist, or health care provider.  2023 [employer]/Gold Standard (2021-06-02 00:00:00)  The chemotherapy medication bag should finish at 46 hours, 96 hours, or 7 days. For example, if your pump is scheduled for 46 hours and it was put on at 4:00 p.m., it should finish at 2:00 p.m. the day it is scheduled to come off regardless of your appointment time.     Estimated time to finish at 11:30 a.m. on Thursday 10/15/2021.   If the display on your pump reads "Low Volume" and it is beeping, take the batteries out of the pump and come to the cancer center for it to be taken off.   If the pump alarms go off prior to the pump reading "Low Volume" then call [phone] and someone can assist you.  If the plunger comes out and the chemotherapy medication is  leaking out, please use your home chemo spill kit to clean up the spill. Do NOT use paper towels or other household products.  If you have problems or questions regarding your pump, please call either [phone] (24 hours a day) or the  cancer center Monday-Friday 8:00 a.m.- 4:30 p.m. at the clinic number and we will assist you. If you are unable to get assistance, then go to the nearest Emergency Department and ask the staff to contact the IV team for assistance.

## 2021-10-14 ENCOUNTER — Other Ambulatory Visit: Payer: Self-pay

## 2021-10-15 ENCOUNTER — Inpatient Hospital Stay: Payer: Medicare Other

## 2021-10-15 VITALS — BP 135/76 | HR 60 | Temp 98.0°F | Resp 20

## 2021-10-15 DIAGNOSIS — C7801 Secondary malignant neoplasm of right lung: Secondary | ICD-10-CM | POA: Diagnosis not present

## 2021-10-15 DIAGNOSIS — C7802 Secondary malignant neoplasm of left lung: Secondary | ICD-10-CM | POA: Diagnosis not present

## 2021-10-15 DIAGNOSIS — Z5112 Encounter for antineoplastic immunotherapy: Secondary | ICD-10-CM | POA: Diagnosis not present

## 2021-10-15 DIAGNOSIS — Z452 Encounter for adjustment and management of vascular access device: Secondary | ICD-10-CM | POA: Diagnosis not present

## 2021-10-15 DIAGNOSIS — Z5111 Encounter for antineoplastic chemotherapy: Secondary | ICD-10-CM | POA: Diagnosis not present

## 2021-10-15 DIAGNOSIS — C187 Malignant neoplasm of sigmoid colon: Secondary | ICD-10-CM | POA: Diagnosis not present

## 2021-10-15 MED ORDER — SODIUM CHLORIDE 0.9% FLUSH
10.0000 mL | INTRAVENOUS | Status: DC | PRN
  Administered 2021-10-15: 10 mL

## 2021-10-15 MED ORDER — HEPARIN SOD (PORK) LOCK FLUSH 100 UNIT/ML IV SOLN
500.0000 [IU] | Freq: Once | INTRAVENOUS | Status: AC | PRN
  Administered 2021-10-15: 500 [IU]

## 2021-10-15 NOTE — Patient Instructions (Signed)
Heparin injection What is this medication? HEPARIN (HEP a rin) is an anticoagulant. It is used to treat or prevent clots in the veins, arteries, lungs, or heart. It stops clots from forming or getting bigger. This medicine prevents clotting during open-heart surgery, dialysis, or in patients who are confined to bed. This medicine may be used for other purposes; ask your health care provider or pharmacist if you have questions. COMMON BRAND NAME(S): Hep-Lock, Hep-Lock U/P, Hepflush-10, Monoject Prefill Advanced Heparin Lock Flush, SASH Normal Saline and Heparin What should I tell my care team before I take this medication? They need to know if you have any of these conditions: bleeding disorders, such as hemophilia or low blood platelets bowel disease or diverticulitis endocarditis high blood pressure liver disease recent surgery or delivery of a baby stomach ulcers an unusual or allergic reaction to heparin, benzyl alcohol, sulfites, other medicines, foods, dyes, or preservatives pregnant or trying to get pregnant breast-feeding How should I use this medication? This medicine is given by injection or infusion into a vein. It can also be given by injection of small amounts under the skin. It is usually given by a health care professional in a hospital or clinic setting. If you get this medicine at home, you will be taught how to prepare and give this medicine. Use exactly as directed. Take your medicine at regular intervals. Do not take it more often than directed. Do not stop taking except on your doctor's advice. Stopping this medicine may increase your risk of a blot clot. Be sure to refill your prescription before you run out of medicine. It is important that you put your used needles and syringes in a special sharps container. Do not put them in a trash can. If you do not have a sharps container, call your pharmacist or healthcare provider to get one. Talk to your pediatrician regarding the  use of this medicine in children. While this medicine may be prescribed for children for selected conditions, precautions do apply. Overdosage: If you think you have taken too much of this medicine contact a poison control center or emergency room at once. NOTE: This medicine is only for you. Do not share this medicine with others. What if I miss a dose? If you miss a dose, take it as soon as you can. If it is almost time for your next dose, take only that dose. Do not take double or extra doses. What may interact with this medication? Do not take this medicine with any of the following medications: aspirin and aspirin-like drugs mifepristone medicines that treat or prevent blood clots like warfarin, enoxaparin, and dalteparin palifermin protamine This medicine may also interact with the following medications: dextran digoxin hydroxychloroquine medicines for treating colds or allergies nicotine NSAIDs, medicines for pain and inflammation, like ibuprofen or naproxen phenylbutazone tetracycline antibiotics This list may not describe all possible interactions. Give your health care provider a list of all the medicines, herbs, non-prescription drugs, or dietary supplements you use. Also tell them if you smoke, drink alcohol, or use illegal drugs. Some items may interact with your medicine. What should I watch for while using this medication? Visit your healthcare professional for regular checks on your progress. You may need blood work done while you are taking this medicine. Your condition will be monitored carefully while you are receiving this medicine. It is important not to miss any appointments. Wear a medical ID bracelet or chain, and carry a card that describes your disease and details  of your medicine and dosage times. Notify your doctor or healthcare professional at once if you have cold, blue hands or feet. If you are going to need surgery or other procedure, tell your healthcare  professional that you are using this medicine. Avoid sports and activities that might cause injury while you are using this medicine. Severe falls or injuries can cause unseen bleeding. Be careful when using sharp tools or knives. Consider using an Copy. Take special care brushing or flossing your teeth. Report any injuries, bruising, or red spots on the skin to your healthcare professional. Using this medicine for a long time may weaken your bones and increase the risk of bone fractures. You should make sure that you get enough calcium and vitamin D while you are taking this medicine. Discuss the foods you eat and the vitamins you take with your healthcare professional. Wear a medical ID bracelet or chain. Carry a card that describes your disease and details of your medicine and dosage times. What side effects may I notice from receiving this medication? Side effects that you should report to your doctor or health care professional as soon as possible: allergic reactions like skin rash, itching or hives, swelling of the face, lips, or tongue bone pain fever, chills nausea, vomiting signs and symptoms of bleeding such as bloody or black, tarry stools; red or dark-brown urine; spitting up blood or brown material that looks like coffee grounds; red spots on the skin; unusual bruising or bleeding from the eye, gums, or nose signs and symptoms of a blood clot such as chest pain; shortness of breath; pain, swelling, or warmth in the leg signs and symptoms of a stroke such as changes in vision; confusion; trouble speaking or understanding; severe headaches; sudden numbness or weakness of the face, arm or leg; trouble walking; dizziness; loss of coordination Side effects that usually do not require medical attention (report to your doctor or health care professional if they continue or are bothersome): hair loss pain, redness, or irritation at site where injected This list may not describe all  possible side effects. Call your doctor for medical advice about side effects. You may report side effects to FDA at 1-800-FDA-1088. Where should I keep my medication? Keep out of the reach of children. Store unopened vials at room temperature between 15 and 30 degrees C (59 and 86 degrees F). Do not freeze. Do not use if solution is discolored or particulate matter is present. Throw away any unused medicine after the expiration date. NOTE: This sheet is a summary. It may not cover all possible information. If you have questions about this medicine, talk to your doctor, pharmacist, or health care provider.  2023 [employer]/Gold Standard (2004-11-02 00:00:00)

## 2021-10-27 ENCOUNTER — Inpatient Hospital Stay: Payer: Medicare Other | Admitting: Nurse Practitioner

## 2021-10-27 ENCOUNTER — Inpatient Hospital Stay: Payer: Medicare Other

## 2021-10-27 ENCOUNTER — Encounter: Payer: Self-pay | Admitting: Nurse Practitioner

## 2021-10-27 VITALS — BP 145/57 | HR 59

## 2021-10-27 VITALS — BP 136/70 | HR 87 | Temp 98.2°F | Resp 18 | Ht 68.0 in | Wt 192.0 lb

## 2021-10-27 DIAGNOSIS — C7802 Secondary malignant neoplasm of left lung: Secondary | ICD-10-CM | POA: Diagnosis not present

## 2021-10-27 DIAGNOSIS — C187 Malignant neoplasm of sigmoid colon: Secondary | ICD-10-CM

## 2021-10-27 DIAGNOSIS — Z5112 Encounter for antineoplastic immunotherapy: Secondary | ICD-10-CM | POA: Diagnosis not present

## 2021-10-27 DIAGNOSIS — Z5111 Encounter for antineoplastic chemotherapy: Secondary | ICD-10-CM | POA: Diagnosis not present

## 2021-10-27 DIAGNOSIS — Z452 Encounter for adjustment and management of vascular access device: Secondary | ICD-10-CM | POA: Diagnosis not present

## 2021-10-27 DIAGNOSIS — C7801 Secondary malignant neoplasm of right lung: Secondary | ICD-10-CM | POA: Diagnosis not present

## 2021-10-27 LAB — CMP (CANCER CENTER ONLY)
ALT: 22 U/L (ref 0–44)
AST: 18 U/L (ref 15–41)
Albumin: 4 g/dL (ref 3.5–5.0)
Alkaline Phosphatase: 83 U/L (ref 38–126)
Anion gap: 9 (ref 5–15)
BUN: 21 mg/dL (ref 8–23)
CO2: 27 mmol/L (ref 22–32)
Calcium: 9.2 mg/dL (ref 8.9–10.3)
Chloride: 100 mmol/L (ref 98–111)
Creatinine: 0.81 mg/dL (ref 0.61–1.24)
GFR, Estimated: >60 mL/min (ref >60)
Glucose, Bld: 103 mg/dL — ABNORMAL HIGH (ref 70–99)
Potassium: 4.2 mmol/L (ref 3.5–5.1)
Sodium: 136 mmol/L (ref 135–145)
Total Bilirubin: 0.4 mg/dL (ref 0.3–1.2)
Total Protein: 7 g/dL (ref 6.5–8.1)

## 2021-10-27 LAB — CBC WITH DIFFERENTIAL (CANCER CENTER ONLY)
Abs Immature Granulocytes: 0.02 10*3/uL (ref 0.00–0.07)
Basophils Absolute: 0.1 10*3/uL (ref 0.0–0.1)
Basophils Relative: 1 %
Eosinophils Absolute: 0.3 10*3/uL (ref 0.0–0.5)
Eosinophils Relative: 5 %
HCT: 43.8 % (ref 39.0–52.0)
Hemoglobin: 15.1 g/dL (ref 13.0–17.0)
Immature Granulocytes: 0 %
Lymphocytes Relative: 25 %
Lymphs Abs: 1.7 10*3/uL (ref 0.7–4.0)
MCH: 32.5 pg (ref 26.0–34.0)
MCHC: 34.5 g/dL (ref 30.0–36.0)
MCV: 94.2 fL (ref 80.0–100.0)
Monocytes Absolute: 0.8 10*3/uL (ref 0.1–1.0)
Monocytes Relative: 12 %
Neutro Abs: 3.8 10*3/uL (ref 1.7–7.7)
Neutrophils Relative %: 57 %
Platelet Count: 221 10*3/uL (ref 150–400)
RBC: 4.65 MIL/uL (ref 4.22–5.81)
RDW: 17 % — ABNORMAL HIGH (ref 11.5–15.5)
WBC Count: 6.8 10*3/uL (ref 4.0–10.5)
nRBC: 0 % (ref 0.0–0.2)

## 2021-10-27 LAB — CEA (ACCESS): CEA (CHCC): 21.87 ng/mL — ABNORMAL HIGH (ref 0.00–5.00)

## 2021-10-27 MED ORDER — PALONOSETRON HCL INJECTION 0.25 MG/5ML
0.2500 mg | Freq: Once | INTRAVENOUS | Status: AC
  Administered 2021-10-27: 0.25 mg via INTRAVENOUS
  Filled 2021-10-27: qty 5

## 2021-10-27 MED ORDER — SODIUM CHLORIDE 0.9 % IV SOLN: Freq: Once | INTRAVENOUS | Status: AC

## 2021-10-27 MED ORDER — FLUOROURACIL CHEMO INJECTION 2.5 GM/50ML
300.0000 mg/m2 | Freq: Once | INTRAVENOUS | Status: AC
  Administered 2021-10-27: 600 mg via INTRAVENOUS
  Filled 2021-10-27: qty 12

## 2021-10-27 MED ORDER — ATROPINE SULFATE 1 MG/ML IV SOLN
0.5000 mg | Freq: Once | INTRAVENOUS | Status: AC | PRN
  Administered 2021-10-27: 0.5 mg via INTRAVENOUS
  Filled 2021-10-27: qty 1

## 2021-10-27 MED ORDER — SODIUM CHLORIDE 0.9 % IV SOLN
180.0000 mg/m2 | Freq: Once | INTRAVENOUS | Status: AC
  Administered 2021-10-27: 360 mg via INTRAVENOUS
  Filled 2021-10-27: qty 15

## 2021-10-27 MED ORDER — SODIUM CHLORIDE 0.9 % IV SOLN
2000.0000 mg/m2 | INTRAVENOUS | Status: DC
  Administered 2021-10-27: 4100 mg via INTRAVENOUS
  Filled 2021-10-27: qty 82

## 2021-10-27 MED ORDER — SODIUM CHLORIDE 0.9 % IV SOLN
5.0000 mg/kg | Freq: Once | INTRAVENOUS | Status: AC
  Administered 2021-10-27: 400 mg via INTRAVENOUS
  Filled 2021-10-27: qty 16

## 2021-10-27 MED ORDER — SODIUM CHLORIDE 0.9 % IV SOLN
300.0000 mg/m2 | Freq: Once | INTRAVENOUS | Status: AC
  Administered 2021-10-27: 612 mg via INTRAVENOUS
  Filled 2021-10-27: qty 30.6

## 2021-10-27 MED ORDER — SODIUM CHLORIDE 0.9 % IV SOLN
10.0000 mg | Freq: Once | INTRAVENOUS | Status: AC
  Administered 2021-10-27: 10 mg via INTRAVENOUS
  Filled 2021-10-27: qty 10

## 2021-10-27 NOTE — Progress Notes (Signed)
  [place] OFFICE PROGRESS NOTE   Diagnosis: Colon cancer  INTERVAL HISTORY:   Craig Best returns as scheduled.  He completed cycle 4 FOLFIRI/bevacizumab 10/13/2021.  He denies significant nausea.  No mouth sores.  Occasional loose stool.  He denies bleeding.  No dyspnea.  He has a good appetite.  No pain.  Objective:  Vital signs in last 24 hours:  Blood pressure 136/70, pulse 87, temperature 98.2 F (36.8 C), temperature source Oral, resp. rate 18, height $RemoveBe'5\' 8"'dgKqdyJQw$  (1.727 m), weight 192 lb (87.1 kg), SpO2 100 %.    HEENT: No thrush or ulcers. Resp: Lungs clear bilaterally.  Mild decrease in breath sounds left lower chest.  No respiratory distress. Cardio: Regular rate and rhythm. GI: Abdomen soft and nontender.  No hepatomegaly. Vascular: No leg edema. Skin: Palms with hyperpigmentation, no erythema. Portacath without erythema.   Lab Results:  Lab Results  Component Value Date   WBC 6.8 10/27/2021   HGB 15.1 10/27/2021   HCT 43.8 10/27/2021   MCV 94.2 10/27/2021   PLT 221 10/27/2021   NEUTROABS 3.8 10/27/2021    Imaging:  No results found.  Medications: I have reviewed the patient's current medications.  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), status post a sigmoid colectomy and partial bladder resection 09/19/2020 2 separate sigmoid colon tumors, 2.5 cm apart, 1/13 lymph nodes positive, tumor extends to adherent soft tissue of the bladder, MSS, normal mismatch repair protein expression tumor mutation burden 0, K-ras G12D CT abdomen/pelvis 09/19/2020-sigmoid colon mass with colonic obstruction, 6 mm right lower lobe nodule CT chest 09/22/2020-multiple bilateral lung nodules consistent with metastases Elevated preoperative CEA Cycle 1 FOLFOX 10/28/2020 Cycle 2 FOLFOX 11/11/2020 Cycle 3 FOLFOX 11/25/2020 Cycle 4 FOLFOX 12/09/2020 Cycle 5 FOLFOX 12/23/2020 CT chest 12/25/2020-unchanged lung nodules, 2 cm subpleural nodule in the left  posterior chest more prominent-loculated fluid? Cycle 6 FOLFOX 02/03/2021-5-FU dose reduced and bolus eliminated secondary to diarrhea Cycle 7 FOLFOX 02/17/2021 Cycle 8 FOLFOX 03/03/2021 PET 03/17/2021 pulmonary nodules without significant change, 10 mm nodule in right middle lobe with mild FDG uptake, mildly hypermetabolic pleural nodule in the lower left hemothorax, new clustered nodularity in the apical and posterior right upper lobe-likely infectious CTs 07/23/2021-new left pleural effusion with multiple pleural nodules, increased size of several pulmonary nodules, stable left paracolic gutter nodule, morphologic features suggestive of early cirrhosis Thoracentesis 07/24/2021-1200 cc of fluid removed, rare atypical cells present Thoracentesis 627 2023-1.5 L of fluid removed, atypical cells present, acute inflammation and blood Thoracentesis 08/14/2021- 1.2 L of fluid removed, adenocarcinoma compatible with metastatic colon cancer Thoracentesis 08/21/2021-adenocarcinoma Cycle 1 FOLFIRI/bevacizumab 09/01/2021 Cycle 2 FOLFIRI/bevacizumab 09/15/2021 Cycle 3 FOLFIRI/bevacizumab 09/29/2021-Decadron prophylaxis added for delayed nausea Cycle 4 FOLFIRI/bevacizumab 10/13/2021 Cycle 5 FOLFIRI/bevacizumab 10/27/2021  2.  Port-A-Cath placement-Dr. 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  He has completed 4 cycles of FOLFIRI/bevacizumab.  Overall he is tolerating treatment well.  Plan to proceed with cycle 5 today as scheduled.  Restaging chest CT prior to next office visit.  CBC reviewed.  Counts adequate to proceed with treatment today.  He will return for lab, follow-up, chemotherapy in 2 weeks.  We are available to see him sooner if needed.    Ned Card ANP/GNP-BC   10/27/2021  9:07 AM

## 2021-10-27 NOTE — Progress Notes (Signed)
Patient seen by Ned Card NP today  Vitals are within treatment parameters.  Labs reviewed by Ned Card NP and are within treatment parameters.Urine protein every other cycle.  Per physician team, patient is ready for treatment and there are NO modifications to the treatment plan.

## 2021-10-27 NOTE — Patient Instructions (Addendum)
[facility]   Discharge Instructions: Thank you for choosing [facility] to provide your oncology and hematology care.   If you have a lab appointment with the [facility], please go directly to the [facility] and check in at the registration area.   Wear comfortable clothing and clothing appropriate for easy access to any Portacath or PICC line.   We strive to give you quality time with your provider. You may need to reschedule your appointment if you arrive late (15 or more minutes).  Arriving late affects you and other patients whose appointments are after yours.  Also, if you miss three or more appointments without notifying the office, you may be dismissed from the clinic at the provider's discretion.      For prescription refill requests, have your pharmacy contact our office and allow 72 hours for refills to be completed.    Today you received the following chemotherapy and/or immunotherapy agents Bevacizumab-bvzr (ZIRABEV), Irinotecan (CAMPTOSAR), Leucovorin & Flourouracil (ADRUCIL).      To help prevent nausea and vomiting after your treatment, we encourage you to take your nausea medication as directed.  BELOW ARE SYMPTOMS THAT SHOULD BE REPORTED IMMEDIATELY: *FEVER GREATER THAN 100.4 F (38 C) OR HIGHER *CHILLS OR SWEATING *NAUSEA AND VOMITING THAT IS NOT CONTROLLED WITH YOUR NAUSEA MEDICATION *UNUSUAL SHORTNESS OF BREATH *UNUSUAL BRUISING OR BLEEDING *URINARY PROBLEMS (pain or burning when urinating, or frequent urination) *BOWEL PROBLEMS (unusual diarrhea, constipation, pain near the anus) TENDERNESS IN MOUTH AND THROAT WITH OR WITHOUT PRESENCE OF ULCERS (sore throat, sores in mouth, or a toothache) UNUSUAL RASH, SWELLING OR PAIN  UNUSUAL VAGINAL DISCHARGE OR ITCHING   Items with * indicate a potential emergency and should be followed up as soon as possible or go to the Emergency Department if any problems should occur.  Please  show the CHEMOTHERAPY ALERT CARD or IMMUNOTHERAPY ALERT CARD at check-in to the Emergency Department and triage nurse.  Should you have questions after your visit or need to cancel or reschedule your appointment, please contact [facility]  Dept: [phone]  and follow the prompts.  Office hours are 8:00 a.m. to 4:30 p.m. Monday - Friday. Please note that voicemails left after 4:00 p.m. may not be returned until the following business day.  We are closed weekends and major holidays. You have access to a nurse at all times for urgent questions. Please call the main number to the clinic Dept: [phone] and follow the prompts.   For any non-urgent questions, you may also contact your provider using MyChart. We now offer e-Visits for anyone 2 and older to request care online for non-urgent symptoms. For details visit [email or web address].   Also download the MyChart app! Go to the app store, search "MyChart", open the app, select [facility], and log in with your MyChart username and password.  Masks are optional in the cancer centers. If you would like for your care team to wear a mask while they are taking care of you, please let them know. You may have one support person who is at least 79 years old accompany you for your appointments.  Bevacizumab Injection What is this medication? BEVACIZUMAB (be va SIZ yoo mab) treats some types of cancer. It works by blocking a protein that causes cancer cells to grow and multiply. This helps to slow or stop the spread of cancer cells. It is a monoclonal antibody. This medicine may be used for  other purposes; ask your health care provider or pharmacist if you have questions. COMMON BRAND NAME(S): Alymsys, Avastin, MVASI, Noah Charon What should I tell my care team before I take this medication? They need to know if you have any of these conditions: Blood clots Coughing up blood Having or recent surgery Heart failure High blood  pressure History of a connection between 2 or more body parts that do not usually connect (fistula) History of a tear in your stomach or intestines Protein in your urine An unusual or allergic reaction to bevacizumab, other medications, foods, dyes, or preservatives Pregnant or trying to get pregnant Breast-feeding How should I use this medication? This medication is injected into a vein. It is given by your care team in a hospital or clinic setting. Talk to your care team the use of this medication in children. Special care may be needed. Overdosage: If you think you have taken too much of this medicine contact a poison control center or emergency room at once. NOTE: This medicine is only for you. Do not share this medicine with others. What if I miss a dose? Keep appointments for follow-up doses. It is important not to miss your dose. Call your care team if you are unable to keep an appointment. What may interact with this medication? Interactions are not expected. This list may not describe all possible interactions. Give your health care provider a list of all the medicines, herbs, non-prescription drugs, or dietary supplements you use. Also tell them if you smoke, drink alcohol, or use illegal drugs. Some items may interact with your medicine. What should I watch for while using this medication? Your condition will be monitored carefully while you are receiving this medication. You may need blood work while taking this medication. This medication may make you feel generally unwell. This is not uncommon as chemotherapy can affect healthy cells as well as cancer cells. Report any side effects. Continue your course of treatment even though you feel ill unless your care team tells you to stop. This medication may increase your risk to bruise or bleed. Call your care team if you notice any unusual bleeding. Before having surgery, talk to your care team to make sure it is ok. This medication can  increase the risk of poor healing of your surgical site or wound. You will need to stop this medication for 28 days before surgery. After surgery, wait at least 28 days before restarting this medication. Make sure the surgical site or wound is healed enough before restarting this medication. Talk to your care team if questions. Talk to your care team if you may be pregnant. Serious birth defects can occur if you take this medication during pregnancy and for 6 months after the last dose. Contraception is recommended while taking this medication and for 6 months after the last dose. Your care team can help you find the option that works for you. Do not breastfeed while taking this medication and for 6 months after the last dose. This medication can cause infertility. Talk to your care team if you are concerned about your fertility. What side effects may I notice from receiving this medication? Side effects that you should report to your care team as soon as possible: Allergic reactions--skin rash, itching, hives, swelling of the face, lips, tongue, or throat Bleeding--bloody or black, tar-like stools, vomiting blood or brown material that looks like coffee grounds, red or dark brown urine, small red or purple spots on skin, unusual bruising or bleeding  Blood clot--pain, swelling, or warmth in the leg, shortness of breath, chest pain Heart attack--pain or tightness in the chest, shoulders, arms, or jaw, nausea, shortness of breath, cold or clammy skin, feeling faint or lightheaded Heart failure--shortness of breath, swelling of the ankles, feet, or hands, sudden weight gain, unusual weakness or fatigue Increase in blood pressure Infection--fever, chills, cough, sore throat, wounds that don't heal, pain or trouble when passing urine, general feeling of discomfort or being unwell Infusion reactions--chest pain, shortness of breath or trouble breathing, feeling faint or lightheaded Kidney injury--decrease in  the amount of urine, swelling of the ankles, hands, or feet Stomach pain that is severe, does not go away, or gets worse Stroke--sudden numbness or weakness of the face, arm, or leg, trouble speaking, confusion, trouble walking, loss of balance or coordination, dizziness, severe headache, change in vision Sudden and severe headache, confusion, change in vision, seizures, which may be signs of posterior reversible encephalopathy syndrome (PRES) Side effects that usually do not require medical attention (report to your care team if they continue or are bothersome): Back pain Change in taste Diarrhea Dry skin Increased tears Nosebleed This list may not describe all possible side effects. Call your doctor for medical advice about side effects. You may report side effects to FDA at 1-800-FDA-1088. Where should I keep my medication? This medication is given in a hospital or clinic. It will not be stored at home. NOTE: This sheet is a summary. It may not cover all possible information. If you have questions about this medicine, talk to your doctor, pharmacist, or health care provider.  2023 Elsevier/Gold Standard (2021-06-09 00:00:00)  Irinotecan Injection What is this medication? IRINOTECAN (ir in oh TEE kan) treats some types of cancer. It works by slowing down the growth of cancer cells. This medicine may be used for other purposes; ask your health care provider or pharmacist if you have questions. COMMON BRAND NAME(S): Camptosar What should I tell my care team before I take this medication? They need to know if you have any of these conditions: Dehydration Diarrhea Infection, especially a viral infection, such as chickenpox, cold sores, herpes Liver disease Low blood cell levels (white cells, red cells, and platelets) Low levels of electrolytes, such as calcium, magnesium, or potassium in your blood Recent or ongoing radiation An unusual or allergic reaction to irinotecan, other  medications, foods, dyes, or preservatives If you or your partner are pregnant or trying to get pregnant Breast-feeding How should I use this medication? This medication is injected into a vein. It is given by your care team in a hospital or clinic setting. Talk to your care team about the use of this medication in children. Special care may be needed. Overdosage: If you think you have taken too much of this medicine contact a poison control center or emergency room at once. NOTE: This medicine is only for you. Do not share this medicine with others. What if I miss a dose? Keep appointments for follow-up doses. It is important not to miss your dose. Call your care team if you are unable to keep an appointment. What may interact with this medication? Do not take this medication with any of the following: Cobicistat Itraconazole This medication may also interact with the following: Certain antibiotics, such as clarithromycin, rifampin, rifabutin Certain antivirals for HIV or AIDS Certain medications for fungal infections, such as ketoconazole, posaconazole, voriconazole Certain medications for seizures, such as carbamazepine, phenobarbital, phenytoin Gemfibrozil Nefazodone St. John's wort This list may  not describe all possible interactions. Give your health care provider a list of all the medicines, herbs, non-prescription drugs, or dietary supplements you use. Also tell them if you smoke, drink alcohol, or use illegal drugs. Some items may interact with your medicine. What should I watch for while using this medication? Your condition will be monitored carefully while you are receiving this medication. You may need blood work while taking this medication. This medication may make you feel generally unwell. This is not uncommon as chemotherapy can affect healthy cells as well as cancer cells. Report any side effects. Continue your course of treatment even though you feel ill unless your care  team tells you to stop. This medication can cause serious side effects. To reduce the risk, your care team may give you other medications to take before receiving this one. Be sure to follow the directions from your care team. This medication may affect your coordination, reaction time, or judgement. Do not drive or operate machinery until you know how this medication affects you. Sit up or stand slowly to reduce the risk of dizzy or fainting spells. Drinking alcohol with this medication can increase the risk of these side effects. This medication may increase your risk of getting an infection. Call your care team for advice if you get a fever, chills, sore throat, or other symptoms of a cold or flu. Do not treat yourself. Try to avoid being around people who are sick. Avoid taking medications that contain aspirin, acetaminophen, ibuprofen, naproxen, or ketoprofen unless instructed by your care team. These medications may hide a fever. This medication may increase your risk to bruise or bleed. Call your care team if you notice any unusual bleeding. Be careful brushing or flossing your teeth or using a toothpick because you may get an infection or bleed more easily. If you have any dental work done, tell your dentist you are receiving this medication. Talk to your care team if you or your partner are pregnant or think either of you might be pregnant. This medication can cause serious birth defects if taken during pregnancy and for 6 months after the last dose. You will need a negative pregnancy test before starting this medication. Contraception is recommended while taking this medication and for 6 months after the last dose. Your care team can help you find the option that works for you. Do not father a child while taking this medication and for 3 months after the last dose. Use a condom for contraception during this time period. Do not breastfeed while taking this medication and for 7 days after the last  dose. This medication may cause infertility. Talk to your care team if you are concerned about your fertility. What side effects may I notice from receiving this medication? Side effects that you should report to your care team as soon as possible: Allergic reactions--skin rash, itching, hives, swelling of the face, lips, tongue, or throat Dry cough, shortness of breath or trouble breathing Increased saliva or tears, increased sweating, stomach cramping, diarrhea, small pupils, unusual weakness or fatigue, slow heartbeat Infection--fever, chills, cough, sore throat, wounds that don't heal, pain or trouble when passing urine, general feeling of discomfort or being unwell Kidney injury--decrease in the amount of urine, swelling of the ankles, hands, or feet Low red blood cell level--unusual weakness or fatigue, dizziness, headache, trouble breathing Severe or prolonged diarrhea Unusual bruising or bleeding Side effects that usually do not require medical attention (report to your care team if they  continue or are bothersome): Constipation Diarrhea Hair loss Loss of appetite Nausea Stomach pain This list may not describe all possible side effects. Call your doctor for medical advice about side effects. You may report side effects to FDA at 1-800-FDA-1088. Where should I keep my medication? This medication is given in a hospital or clinic. It will not be stored at home. NOTE: This sheet is a summary. It may not cover all possible information. If you have questions about this medicine, talk to your doctor, pharmacist, or health care provider.  2023 Elsevier/Gold Standard (2021-06-04 00:00:00)  Leucovorin Injection What is this medication? LEUCOVORIN (loo koe VOR in) prevents side effects from certain medications, such as methotrexate. It works by increasing folate levels. This helps protect healthy cells in your body. It may also be used to treat anemia caused by low levels of folate. It can  also be used with fluorouracil, a type of chemotherapy, to treat colorectal cancer. It works by increasing the effects of fluorouracil in the body. This medicine may be used for other purposes; ask your health care provider or pharmacist if you have questions. What should I tell my care team before I take this medication? They need to know if you have any of these conditions: Anemia from low levels of vitamin B12 in the blood An unusual or allergic reaction to leucovorin, folic acid, other medications, foods, dyes, or preservatives Pregnant or trying to get pregnant Breastfeeding How should I use this medication? This medication is injected into a vein or a muscle. It is given by your care team in a hospital or clinic setting. Talk to your care team about the use of this medication in children. Special care may be needed. Overdosage: If you think you have taken too much of this medicine contact a poison control center or emergency room at once. NOTE: This medicine is only for you. Do not share this medicine with others. What if I miss a dose? Keep appointments for follow-up doses. It is important not to miss your dose. Call your care team if you are unable to keep an appointment. What may interact with this medication? Capecitabine Fluorouracil Phenobarbital Phenytoin Primidone Trimethoprim;sulfamethoxazole This list may not describe all possible interactions. Give your health care provider a list of all the medicines, herbs, non-prescription drugs, or dietary supplements you use. Also tell them if you smoke, drink alcohol, or use illegal drugs. Some items may interact with your medicine. What should I watch for while using this medication? Your condition will be monitored carefully while you are receiving this medication. This medication may increase the side effects of 5-fluorouracil. Tell your care team if you have diarrhea or mouth sores that do not get better or that get worse. What  side effects may I notice from receiving this medication? Side effects that you should report to your care team as soon as possible: Allergic reactions--skin rash, itching, hives, swelling of the face, lips, tongue, or throat This list may not describe all possible side effects. Call your doctor for medical advice about side effects. You may report side effects to FDA at 1-800-FDA-1088. Where should I keep my medication? This medication is given in a hospital or clinic. It will not be stored at home. NOTE: This sheet is a summary. It may not cover all possible information. If you have questions about this medicine, talk to your doctor, pharmacist, or health care provider.  2023 Elsevier/Gold Standard (2021-06-05 00:00:00)  Fluorouracil Injection What is this medication? FLUOROURACIL (  flure oh YOOR a sil) treats some types of cancer. It works by slowing down the growth of cancer cells. This medicine may be used for other purposes; ask your health care provider or pharmacist if you have questions. COMMON BRAND NAME(S): Adrucil What should I tell my care team before I take this medication? They need to know if you have any of these conditions: Blood disorders Dihydropyrimidine dehydrogenase (DPD) deficiency Infection, such as chickenpox, cold sores, herpes Kidney disease Liver disease Poor nutrition Recent or ongoing radiation therapy An unusual or allergic reaction to fluorouracil, other medications, foods, dyes, or preservatives If you or your partner are pregnant or trying to get pregnant Breast-feeding How should I use this medication? This medication is injected into a vein. It is administered by your care team in a hospital or clinic setting. Talk to your care team about the use of this medication in children. Special care may be needed. Overdosage: If you think you have taken too much of this medicine contact a poison control center or emergency room at once. NOTE: This medicine is  only for you. Do not share this medicine with others. What if I miss a dose? Keep appointments for follow-up doses. It is important not to miss your dose. Call your care team if you are unable to keep an appointment. What may interact with this medication? Do not take this medication with any of the following: Live virus vaccines This medication may also interact with the following: Medications that treat or prevent blood clots, such as warfarin, enoxaparin, dalteparin This list may not describe all possible interactions. Give your health care provider a list of all the medicines, herbs, non-prescription drugs, or dietary supplements you use. Also tell them if you smoke, drink alcohol, or use illegal drugs. Some items may interact with your medicine. What should I watch for while using this medication? Your condition will be monitored carefully while you are receiving this medication. This medication may make you feel generally unwell. This is not uncommon as chemotherapy can affect healthy cells as well as cancer cells. Report any side effects. Continue your course of treatment even though you feel ill unless your care team tells you to stop. In some cases, you may be given additional medications to help with side effects. Follow all directions for their use. This medication may increase your risk of getting an infection. Call your care team for advice if you get a fever, chills, sore throat, or other symptoms of a cold or flu. Do not treat yourself. Try to avoid being around people who are sick. This medication may increase your risk to bruise or bleed. Call your care team if you notice any unusual bleeding. Be careful brushing or flossing your teeth or using a toothpick because you may get an infection or bleed more easily. If you have any dental work done, tell your dentist you are receiving this medication. Avoid taking medications that contain aspirin, acetaminophen, ibuprofen, naproxen, or  ketoprofen unless instructed by your care team. These medications may hide a fever. Do not treat diarrhea with over the counter products. Contact your care team if you have diarrhea that lasts more than 2 days or if it is severe and watery. This medication can make you more sensitive to the sun. Keep out of the sun. If you cannot avoid being in the sun, wear protective clothing and sunscreen. Do not use sun lamps, tanning beds, or tanning booths. Talk to your care team if you or  your partner wish to become pregnant or think you might be pregnant. This medication can cause serious birth defects if taken during pregnancy and for 3 months after the last dose. A reliable form of contraception is recommended while taking this medication and for 3 months after the last dose. Talk to your care team about effective forms of contraception. Do not father a child while taking this medication and for 3 months after the last dose. Use a condom while having sex during this time period. Do not breastfeed while taking this medication. This medication may cause infertility. Talk to your care team if you are concerned about your fertility. What side effects may I notice from receiving this medication? Side effects that you should report to your care team as soon as possible: Allergic reactions--skin rash, itching, hives, swelling of the face, lips, tongue, or throat Heart attack--pain or tightness in the chest, shoulders, arms, or jaw, nausea, shortness of breath, cold or clammy skin, feeling faint or lightheaded Heart failure--shortness of breath, swelling of the ankles, feet, or hands, sudden weight gain, unusual weakness or fatigue Heart rhythm changes--fast or irregular heartbeat, dizziness, feeling faint or lightheaded, chest pain, trouble breathing High ammonia level--unusual weakness or fatigue, confusion, loss of appetite, nausea, vomiting, seizures Infection--fever, chills, cough, sore throat, wounds that don't  heal, pain or trouble when passing urine, general feeling of discomfort or being unwell Low red blood cell level--unusual weakness or fatigue, dizziness, headache, trouble breathing Pain, tingling, or numbness in the hands or feet, muscle weakness, change in vision, confusion or trouble speaking, loss of balance or coordination, trouble walking, seizures Redness, swelling, and blistering of the skin over hands and feet Severe or prolonged diarrhea Unusual bruising or bleeding Side effects that usually do not require medical attention (report to your care team if they continue or are bothersome): Dry skin Headache Increased tears Nausea Pain, redness, or swelling with sores inside the mouth or throat Sensitivity to light Vomiting This list may not describe all possible side effects. Call your doctor for medical advice about side effects. You may report side effects to FDA at 1-800-FDA-1088. Where should I keep my medication? This medication is given in a hospital or clinic. It will not be stored at home. NOTE: This sheet is a summary. It may not cover all possible information. If you have questions about this medicine, talk to your doctor, pharmacist, or health care provider.  2023 [employer] Standard (2021-06-02 00:00:00)  The chemotherapy medication bag should finish at 46 hours, 96 hours, or 7 days. For example, if your pump is scheduled for 46 hours and it was put on at 4:00 p.m., it should finish at 2:00 p.m. the day it is scheduled to come off regardless of your appointment time.     Estimated time to finish at 11:00 a.m. on Thursday 10/29/2021.   If the display on your pump reads "Low Volume" and it is beeping, take the batteries out of the pump and come to the cancer center for it to be taken off.   If the pump alarms go off prior to the pump reading "Low Volume" then call [phone] and someone can assist you.  If the plunger comes out and the chemotherapy medication is  leaking out, please use your home chemo spill kit to clean up the spill. Do NOT use paper towels or other household products.  If you have problems or questions regarding your pump, please call either [phone] (24 hours a day) or the  cancer center Monday-Friday 8:00 a.m.- 4:30 p.m. at the clinic number and we will assist you. If you are unable to get assistance, then go to the nearest Emergency Department and ask the staff to contact the IV team for assistance.

## 2021-10-27 NOTE — Patient Instructions (Signed)

## 2021-10-28 ENCOUNTER — Other Ambulatory Visit: Payer: Self-pay

## 2021-10-29 ENCOUNTER — Inpatient Hospital Stay: Payer: Medicare Other

## 2021-10-29 VITALS — BP 129/7 | HR 60 | Temp 98.0°F | Resp 18

## 2021-10-29 DIAGNOSIS — C7801 Secondary malignant neoplasm of right lung: Secondary | ICD-10-CM | POA: Diagnosis not present

## 2021-10-29 DIAGNOSIS — Z452 Encounter for adjustment and management of vascular access device: Secondary | ICD-10-CM | POA: Diagnosis not present

## 2021-10-29 DIAGNOSIS — Z5112 Encounter for antineoplastic immunotherapy: Secondary | ICD-10-CM | POA: Diagnosis not present

## 2021-10-29 DIAGNOSIS — Z5111 Encounter for antineoplastic chemotherapy: Secondary | ICD-10-CM | POA: Diagnosis not present

## 2021-10-29 DIAGNOSIS — C187 Malignant neoplasm of sigmoid colon: Secondary | ICD-10-CM | POA: Diagnosis not present

## 2021-10-29 DIAGNOSIS — C7802 Secondary malignant neoplasm of left lung: Secondary | ICD-10-CM | POA: Diagnosis not present

## 2021-10-29 MED ORDER — SODIUM CHLORIDE 0.9% FLUSH
10.0000 mL | INTRAVENOUS | Status: DC | PRN
  Administered 2021-10-29: 10 mL

## 2021-10-29 MED ORDER — HEPARIN SOD (PORK) LOCK FLUSH 100 UNIT/ML IV SOLN
500.0000 [IU] | Freq: Once | INTRAVENOUS | Status: AC | PRN
  Administered 2021-10-29: 500 [IU]

## 2021-11-05 ENCOUNTER — Ambulatory Visit (HOSPITAL_COMMUNITY)
Admission: RE | Admit: 2021-11-05 | Discharge: 2021-11-05 | Disposition: A | Discharge status: Home / Self Care | Payer: Medicare Other | Source: Ambulatory Visit | Attending: Oncology | Admitting: Oncology

## 2021-11-05 DIAGNOSIS — C187 Malignant neoplasm of sigmoid colon: Secondary | ICD-10-CM | POA: Diagnosis not present

## 2021-11-05 DIAGNOSIS — I700 Atherosclerosis of aorta: Secondary | ICD-10-CM | POA: Diagnosis not present

## 2021-11-05 DIAGNOSIS — R911 Solitary pulmonary nodule: Secondary | ICD-10-CM | POA: Diagnosis not present

## 2021-11-08 ENCOUNTER — Other Ambulatory Visit: Payer: Self-pay | Admitting: Oncology

## 2021-11-08 DIAGNOSIS — C187 Malignant neoplasm of sigmoid colon: Secondary | ICD-10-CM

## 2021-11-10 ENCOUNTER — Inpatient Hospital Stay: Payer: Medicare Other

## 2021-11-10 ENCOUNTER — Inpatient Hospital Stay: Payer: Medicare Other | Admitting: Oncology

## 2021-11-10 ENCOUNTER — Inpatient Hospital Stay: Payer: Medicare Other | Attending: Oncology

## 2021-11-10 VITALS — BP 143/72 | HR 62

## 2021-11-10 VITALS — BP 139/76 | HR 83 | Temp 98.1°F | Resp 18 | Ht 68.0 in | Wt 194.0 lb

## 2021-11-10 DIAGNOSIS — Z452 Encounter for adjustment and management of vascular access device: Secondary | ICD-10-CM | POA: Diagnosis not present

## 2021-11-10 DIAGNOSIS — Z5112 Encounter for antineoplastic immunotherapy: Secondary | ICD-10-CM | POA: Diagnosis not present

## 2021-11-10 DIAGNOSIS — C187 Malignant neoplasm of sigmoid colon: Secondary | ICD-10-CM

## 2021-11-10 DIAGNOSIS — C7802 Secondary malignant neoplasm of left lung: Secondary | ICD-10-CM | POA: Diagnosis not present

## 2021-11-10 DIAGNOSIS — C7801 Secondary malignant neoplasm of right lung: Secondary | ICD-10-CM | POA: Diagnosis not present

## 2021-11-10 DIAGNOSIS — Z5111 Encounter for antineoplastic chemotherapy: Secondary | ICD-10-CM | POA: Insufficient documentation

## 2021-11-10 LAB — CBC WITH DIFFERENTIAL (CANCER CENTER ONLY)
Abs Immature Granulocytes: 0.03 10*3/uL (ref 0.00–0.07)
Basophils Absolute: 0.1 10*3/uL (ref 0.0–0.1)
Basophils Relative: 1 %
Eosinophils Absolute: 0.3 10*3/uL (ref 0.0–0.5)
Eosinophils Relative: 5 %
HCT: 40.4 % (ref 39.0–52.0)
Hemoglobin: 14 g/dL (ref 13.0–17.0)
Immature Granulocytes: 1 %
Lymphocytes Relative: 25 %
Lymphs Abs: 1.5 10*3/uL (ref 0.7–4.0)
MCH: 32.7 pg (ref 26.0–34.0)
MCHC: 34.7 g/dL (ref 30.0–36.0)
MCV: 94.4 fL (ref 80.0–100.0)
Monocytes Absolute: 0.8 10*3/uL (ref 0.1–1.0)
Monocytes Relative: 15 %
Neutro Abs: 3.1 10*3/uL (ref 1.7–7.7)
Neutrophils Relative %: 53 %
Platelet Count: 307 10*3/uL (ref 150–400)
RBC: 4.28 MIL/uL (ref 4.22–5.81)
RDW: 16.7 % — ABNORMAL HIGH (ref 11.5–15.5)
WBC Count: 5.7 10*3/uL (ref 4.0–10.5)
nRBC: 0 % (ref 0.0–0.2)

## 2021-11-10 LAB — CMP (CANCER CENTER ONLY)
ALT: 23 U/L (ref 0–44)
AST: 20 U/L (ref 15–41)
Albumin: 4 g/dL (ref 3.5–5.0)
Alkaline Phosphatase: 76 U/L (ref 38–126)
Anion gap: 11 (ref 5–15)
BUN: 15 mg/dL (ref 8–23)
CO2: 26 mmol/L (ref 22–32)
Calcium: 9 mg/dL (ref 8.9–10.3)
Chloride: 102 mmol/L (ref 98–111)
Creatinine: 0.77 mg/dL (ref 0.61–1.24)
GFR, Estimated: >60 mL/min (ref >60)
Glucose, Bld: 114 mg/dL — ABNORMAL HIGH (ref 70–99)
Potassium: 3.7 mmol/L (ref 3.5–5.1)
Sodium: 139 mmol/L (ref 135–145)
Total Bilirubin: 0.6 mg/dL (ref 0.3–1.2)
Total Protein: 7.3 g/dL (ref 6.5–8.1)

## 2021-11-10 LAB — CEA (ACCESS): CEA (CHCC): 21.97 ng/mL — ABNORMAL HIGH (ref 0.00–5.00)

## 2021-11-10 LAB — TOTAL PROTEIN, URINE DIPSTICK

## 2021-11-10 MED ORDER — SODIUM CHLORIDE 0.9 % IV SOLN
10.0000 mg | Freq: Once | INTRAVENOUS | Status: AC
  Administered 2021-11-10: 10 mg via INTRAVENOUS
  Filled 2021-11-10: qty 10

## 2021-11-10 MED ORDER — SODIUM CHLORIDE 0.9 % IV SOLN
300.0000 mg/m2 | Freq: Once | INTRAVENOUS | Status: AC
  Administered 2021-11-10: 612 mg via INTRAVENOUS
  Filled 2021-11-10: qty 30.6

## 2021-11-10 MED ORDER — SODIUM CHLORIDE 0.9 % IV SOLN
300.0000 mg/m2 | Freq: Once | INTRAVENOUS | Status: DC
  Filled 2021-11-10: qty 30.6

## 2021-11-10 MED ORDER — FLUOROURACIL CHEMO INJECTION 2.5 GM/50ML
300.0000 mg/m2 | Freq: Once | INTRAVENOUS | Status: AC
  Administered 2021-11-10: 600 mg via INTRAVENOUS
  Filled 2021-11-10: qty 12

## 2021-11-10 MED ORDER — PALONOSETRON HCL INJECTION 0.25 MG/5ML
0.2500 mg | Freq: Once | INTRAVENOUS | Status: AC
  Administered 2021-11-10: 0.25 mg via INTRAVENOUS
  Filled 2021-11-10: qty 5

## 2021-11-10 MED ORDER — SODIUM CHLORIDE 0.9 % IV SOLN: Freq: Once | INTRAVENOUS | Status: AC

## 2021-11-10 MED ORDER — SODIUM CHLORIDE 0.9 % IV SOLN
180.0000 mg/m2 | Freq: Once | INTRAVENOUS | Status: AC
  Administered 2021-11-10: 360 mg via INTRAVENOUS
  Filled 2021-11-10: qty 15

## 2021-11-10 MED ORDER — ATROPINE SULFATE 1 MG/ML IV SOLN
0.5000 mg | Freq: Once | INTRAVENOUS | Status: AC | PRN
  Administered 2021-11-10: 0.5 mg via INTRAVENOUS
  Filled 2021-11-10: qty 1

## 2021-11-10 MED ORDER — SODIUM CHLORIDE 0.9 % IV SOLN
5.0000 mg/kg | Freq: Once | INTRAVENOUS | Status: AC
  Administered 2021-11-10: 400 mg via INTRAVENOUS
  Filled 2021-11-10: qty 16

## 2021-11-10 MED ORDER — SODIUM CHLORIDE 0.9 % IV SOLN
2000.0000 mg/m2 | INTRAVENOUS | Status: DC
  Administered 2021-11-10: 4100 mg via INTRAVENOUS
  Filled 2021-11-10: qty 82

## 2021-11-10 NOTE — Patient Instructions (Signed)
[facility]   Discharge Instructions: Thank you for choosing [facility] to provide your oncology and hematology care.   If you have a lab appointment with the [facility], please go directly to the [facility] and check in at the registration area.   Wear comfortable clothing and clothing appropriate for easy access to any Portacath or PICC line.   We strive to give you quality time with your provider. You may need to reschedule your appointment if you arrive late (15 or more minutes).  Arriving late affects you and other patients whose appointments are after yours.  Also, if you miss three or more appointments without notifying the office, you may be dismissed from the clinic at the provider's discretion.      For prescription refill requests, have your pharmacy contact our office and allow 72 hours for refills to be completed.    Today you received the following chemotherapy and/or immunotherapy agents Bevacizumab-bvzr (ZIRABEV), Irinotecan (CAMPTOSAR), Leucovorin & Flourouracil (ADRUCIL).      To help prevent nausea and vomiting after your treatment, we encourage you to take your nausea medication as directed.  BELOW ARE SYMPTOMS THAT SHOULD BE REPORTED IMMEDIATELY: *FEVER GREATER THAN 100.4 F (38 C) OR HIGHER *CHILLS OR SWEATING *NAUSEA AND VOMITING THAT IS NOT CONTROLLED WITH YOUR NAUSEA MEDICATION *UNUSUAL SHORTNESS OF BREATH *UNUSUAL BRUISING OR BLEEDING *URINARY PROBLEMS (pain or burning when urinating, or frequent urination) *BOWEL PROBLEMS (unusual diarrhea, constipation, pain near the anus) TENDERNESS IN MOUTH AND THROAT WITH OR WITHOUT PRESENCE OF ULCERS (sore throat, sores in mouth, or a toothache) UNUSUAL RASH, SWELLING OR PAIN  UNUSUAL VAGINAL DISCHARGE OR ITCHING   Items with * indicate a potential emergency and should be followed up as soon as possible or go to the Emergency Department if any problems should occur.  Please  show the CHEMOTHERAPY ALERT CARD or IMMUNOTHERAPY ALERT CARD at check-in to the Emergency Department and triage nurse.  Should you have questions after your visit or need to cancel or reschedule your appointment, please contact [facility]  Dept: [phone]  and follow the prompts.  Office hours are 8:00 a.m. to 4:30 p.m. Monday - Friday. Please note that voicemails left after 4:00 p.m. may not be returned until the following business day.  We are closed weekends and major holidays. You have access to a nurse at all times for urgent questions. Please call the main number to the clinic Dept: [phone] and follow the prompts.   For any non-urgent questions, you may also contact your provider using MyChart. We now offer e-Visits for anyone 2 and older to request care online for non-urgent symptoms. For details visit [email or web address].   Also download the MyChart app! Go to the app store, search "MyChart", open the app, select [facility], and log in with your MyChart username and password.  Masks are optional in the cancer centers. If you would like for your care team to wear a mask while they are taking care of you, please let them know. You may have one support person who is at least 79 years old accompany you for your appointments.  Bevacizumab Injection What is this medication? BEVACIZUMAB (be va SIZ yoo mab) treats some types of cancer. It works by blocking a protein that causes cancer cells to grow and multiply. This helps to slow or stop the spread of cancer cells. It is a monoclonal antibody. This medicine may be used for  other purposes; ask your health care provider or pharmacist if you have questions. COMMON BRAND NAME(S): Alymsys, Avastin, MVASI, Noah Charon What should I tell my care team before I take this medication? They need to know if you have any of these conditions: Blood clots Coughing up blood Having or recent surgery Heart failure High blood  pressure History of a connection between 2 or more body parts that do not usually connect (fistula) History of a tear in your stomach or intestines Protein in your urine An unusual or allergic reaction to bevacizumab, other medications, foods, dyes, or preservatives Pregnant or trying to get pregnant Breast-feeding How should I use this medication? This medication is injected into a vein. It is given by your care team in a hospital or clinic setting. Talk to your care team the use of this medication in children. Special care may be needed. Overdosage: If you think you have taken too much of this medicine contact a poison control center or emergency room at once. NOTE: This medicine is only for you. Do not share this medicine with others. What if I miss a dose? Keep appointments for follow-up doses. It is important not to miss your dose. Call your care team if you are unable to keep an appointment. What may interact with this medication? Interactions are not expected. This list may not describe all possible interactions. Give your health care provider a list of all the medicines, herbs, non-prescription drugs, or dietary supplements you use. Also tell them if you smoke, drink alcohol, or use illegal drugs. Some items may interact with your medicine. What should I watch for while using this medication? Your condition will be monitored carefully while you are receiving this medication. You may need blood work while taking this medication. This medication may make you feel generally unwell. This is not uncommon as chemotherapy can affect healthy cells as well as cancer cells. Report any side effects. Continue your course of treatment even though you feel ill unless your care team tells you to stop. This medication may increase your risk to bruise or bleed. Call your care team if you notice any unusual bleeding. Before having surgery, talk to your care team to make sure it is ok. This medication can  increase the risk of poor healing of your surgical site or wound. You will need to stop this medication for 28 days before surgery. After surgery, wait at least 28 days before restarting this medication. Make sure the surgical site or wound is healed enough before restarting this medication. Talk to your care team if questions. Talk to your care team if you may be pregnant. Serious birth defects can occur if you take this medication during pregnancy and for 6 months after the last dose. Contraception is recommended while taking this medication and for 6 months after the last dose. Your care team can help you find the option that works for you. Do not breastfeed while taking this medication and for 6 months after the last dose. This medication can cause infertility. Talk to your care team if you are concerned about your fertility. What side effects may I notice from receiving this medication? Side effects that you should report to your care team as soon as possible: Allergic reactions--skin rash, itching, hives, swelling of the face, lips, tongue, or throat Bleeding--bloody or black, tar-like stools, vomiting blood or brown material that looks like coffee grounds, red or dark brown urine, small red or purple spots on skin, unusual bruising or bleeding  Blood clot--pain, swelling, or warmth in the leg, shortness of breath, chest pain Heart attack--pain or tightness in the chest, shoulders, arms, or jaw, nausea, shortness of breath, cold or clammy skin, feeling faint or lightheaded Heart failure--shortness of breath, swelling of the ankles, feet, or hands, sudden weight gain, unusual weakness or fatigue Increase in blood pressure Infection--fever, chills, cough, sore throat, wounds that don't heal, pain or trouble when passing urine, general feeling of discomfort or being unwell Infusion reactions--chest pain, shortness of breath or trouble breathing, feeling faint or lightheaded Kidney injury--decrease in  the amount of urine, swelling of the ankles, hands, or feet Stomach pain that is severe, does not go away, or gets worse Stroke--sudden numbness or weakness of the face, arm, or leg, trouble speaking, confusion, trouble walking, loss of balance or coordination, dizziness, severe headache, change in vision Sudden and severe headache, confusion, change in vision, seizures, which may be signs of posterior reversible encephalopathy syndrome (PRES) Side effects that usually do not require medical attention (report to your care team if they continue or are bothersome): Back pain Change in taste Diarrhea Dry skin Increased tears Nosebleed This list may not describe all possible side effects. Call your doctor for medical advice about side effects. You may report side effects to FDA at 1-800-FDA-1088. Where should I keep my medication? This medication is given in a hospital or clinic. It will not be stored at home. NOTE: This sheet is a summary. It may not cover all possible information. If you have questions about this medicine, talk to your doctor, pharmacist, or health care provider.  2023 Elsevier/Gold Standard (2021-06-09 00:00:00)  Irinotecan Injection What is this medication? IRINOTECAN (ir in oh TEE kan) treats some types of cancer. It works by slowing down the growth of cancer cells. This medicine may be used for other purposes; ask your health care provider or pharmacist if you have questions. COMMON BRAND NAME(S): Camptosar What should I tell my care team before I take this medication? They need to know if you have any of these conditions: Dehydration Diarrhea Infection, especially a viral infection, such as chickenpox, cold sores, herpes Liver disease Low blood cell levels (white cells, red cells, and platelets) Low levels of electrolytes, such as calcium, magnesium, or potassium in your blood Recent or ongoing radiation An unusual or allergic reaction to irinotecan, other  medications, foods, dyes, or preservatives If you or your partner are pregnant or trying to get pregnant Breast-feeding How should I use this medication? This medication is injected into a vein. It is given by your care team in a hospital or clinic setting. Talk to your care team about the use of this medication in children. Special care may be needed. Overdosage: If you think you have taken too much of this medicine contact a poison control center or emergency room at once. NOTE: This medicine is only for you. Do not share this medicine with others. What if I miss a dose? Keep appointments for follow-up doses. It is important not to miss your dose. Call your care team if you are unable to keep an appointment. What may interact with this medication? Do not take this medication with any of the following: Cobicistat Itraconazole This medication may also interact with the following: Certain antibiotics, such as clarithromycin, rifampin, rifabutin Certain antivirals for HIV or AIDS Certain medications for fungal infections, such as ketoconazole, posaconazole, voriconazole Certain medications for seizures, such as carbamazepine, phenobarbital, phenytoin Gemfibrozil Nefazodone St. John's wort This list may  not describe all possible interactions. Give your health care provider a list of all the medicines, herbs, non-prescription drugs, or dietary supplements you use. Also tell them if you smoke, drink alcohol, or use illegal drugs. Some items may interact with your medicine. What should I watch for while using this medication? Your condition will be monitored carefully while you are receiving this medication. You may need blood work while taking this medication. This medication may make you feel generally unwell. This is not uncommon as chemotherapy can affect healthy cells as well as cancer cells. Report any side effects. Continue your course of treatment even though you feel ill unless your care  team tells you to stop. This medication can cause serious side effects. To reduce the risk, your care team may give you other medications to take before receiving this one. Be sure to follow the directions from your care team. This medication may affect your coordination, reaction time, or judgement. Do not drive or operate machinery until you know how this medication affects you. Sit up or stand slowly to reduce the risk of dizzy or fainting spells. Drinking alcohol with this medication can increase the risk of these side effects. This medication may increase your risk of getting an infection. Call your care team for advice if you get a fever, chills, sore throat, or other symptoms of a cold or flu. Do not treat yourself. Try to avoid being around people who are sick. Avoid taking medications that contain aspirin, acetaminophen, ibuprofen, naproxen, or ketoprofen unless instructed by your care team. These medications may hide a fever. This medication may increase your risk to bruise or bleed. Call your care team if you notice any unusual bleeding. Be careful brushing or flossing your teeth or using a toothpick because you may get an infection or bleed more easily. If you have any dental work done, tell your dentist you are receiving this medication. Talk to your care team if you or your partner are pregnant or think either of you might be pregnant. This medication can cause serious birth defects if taken during pregnancy and for 6 months after the last dose. You will need a negative pregnancy test before starting this medication. Contraception is recommended while taking this medication and for 6 months after the last dose. Your care team can help you find the option that works for you. Do not father a child while taking this medication and for 3 months after the last dose. Use a condom for contraception during this time period. Do not breastfeed while taking this medication and for 7 days after the last  dose. This medication may cause infertility. Talk to your care team if you are concerned about your fertility. What side effects may I notice from receiving this medication? Side effects that you should report to your care team as soon as possible: Allergic reactions--skin rash, itching, hives, swelling of the face, lips, tongue, or throat Dry cough, shortness of breath or trouble breathing Increased saliva or tears, increased sweating, stomach cramping, diarrhea, small pupils, unusual weakness or fatigue, slow heartbeat Infection--fever, chills, cough, sore throat, wounds that don't heal, pain or trouble when passing urine, general feeling of discomfort or being unwell Kidney injury--decrease in the amount of urine, swelling of the ankles, hands, or feet Low red blood cell level--unusual weakness or fatigue, dizziness, headache, trouble breathing Severe or prolonged diarrhea Unusual bruising or bleeding Side effects that usually do not require medical attention (report to your care team if they  continue or are bothersome): Constipation Diarrhea Hair loss Loss of appetite Nausea Stomach pain This list may not describe all possible side effects. Call your doctor for medical advice about side effects. You may report side effects to FDA at 1-800-FDA-1088. Where should I keep my medication? This medication is given in a hospital or clinic. It will not be stored at home. NOTE: This sheet is a summary. It may not cover all possible information. If you have questions about this medicine, talk to your doctor, pharmacist, or health care provider.  2023 Elsevier/Gold Standard (2021-06-04 00:00:00)  Leucovorin Injection What is this medication? LEUCOVORIN (loo koe VOR in) prevents side effects from certain medications, such as methotrexate. It works by increasing folate levels. This helps protect healthy cells in your body. It may also be used to treat anemia caused by low levels of folate. It can  also be used with fluorouracil, a type of chemotherapy, to treat colorectal cancer. It works by increasing the effects of fluorouracil in the body. This medicine may be used for other purposes; ask your health care provider or pharmacist if you have questions. What should I tell my care team before I take this medication? They need to know if you have any of these conditions: Anemia from low levels of vitamin B12 in the blood An unusual or allergic reaction to leucovorin, folic acid, other medications, foods, dyes, or preservatives Pregnant or trying to get pregnant Breastfeeding How should I use this medication? This medication is injected into a vein or a muscle. It is given by your care team in a hospital or clinic setting. Talk to your care team about the use of this medication in children. Special care may be needed. Overdosage: If you think you have taken too much of this medicine contact a poison control center or emergency room at once. NOTE: This medicine is only for you. Do not share this medicine with others. What if I miss a dose? Keep appointments for follow-up doses. It is important not to miss your dose. Call your care team if you are unable to keep an appointment. What may interact with this medication? Capecitabine Fluorouracil Phenobarbital Phenytoin Primidone Trimethoprim;sulfamethoxazole This list may not describe all possible interactions. Give your health care provider a list of all the medicines, herbs, non-prescription drugs, or dietary supplements you use. Also tell them if you smoke, drink alcohol, or use illegal drugs. Some items may interact with your medicine. What should I watch for while using this medication? Your condition will be monitored carefully while you are receiving this medication. This medication may increase the side effects of 5-fluorouracil. Tell your care team if you have diarrhea or mouth sores that do not get better or that get worse. What  side effects may I notice from receiving this medication? Side effects that you should report to your care team as soon as possible: Allergic reactions--skin rash, itching, hives, swelling of the face, lips, tongue, or throat This list may not describe all possible side effects. Call your doctor for medical advice about side effects. You may report side effects to FDA at 1-800-FDA-1088. Where should I keep my medication? This medication is given in a hospital or clinic. It will not be stored at home. NOTE: This sheet is a summary. It may not cover all possible information. If you have questions about this medicine, talk to your doctor, pharmacist, or health care provider.  2023 Elsevier/Gold Standard (2021-06-05 00:00:00)  Fluorouracil Injection What is this medication? FLUOROURACIL (  flure oh YOOR a sil) treats some types of cancer. It works by slowing down the growth of cancer cells. This medicine may be used for other purposes; ask your health care provider or pharmacist if you have questions. COMMON BRAND NAME(S): Adrucil What should I tell my care team before I take this medication? They need to know if you have any of these conditions: Blood disorders Dihydropyrimidine dehydrogenase (DPD) deficiency Infection, such as chickenpox, cold sores, herpes Kidney disease Liver disease Poor nutrition Recent or ongoing radiation therapy An unusual or allergic reaction to fluorouracil, other medications, foods, dyes, or preservatives If you or your partner are pregnant or trying to get pregnant Breast-feeding How should I use this medication? This medication is injected into a vein. It is administered by your care team in a hospital or clinic setting. Talk to your care team about the use of this medication in children. Special care may be needed. Overdosage: If you think you have taken too much of this medicine contact a poison control center or emergency room at once. NOTE: This medicine is  only for you. Do not share this medicine with others. What if I miss a dose? Keep appointments for follow-up doses. It is important not to miss your dose. Call your care team if you are unable to keep an appointment. What may interact with this medication? Do not take this medication with any of the following: Live virus vaccines This medication may also interact with the following: Medications that treat or prevent blood clots, such as warfarin, enoxaparin, dalteparin This list may not describe all possible interactions. Give your health care provider a list of all the medicines, herbs, non-prescription drugs, or dietary supplements you use. Also tell them if you smoke, drink alcohol, or use illegal drugs. Some items may interact with your medicine. What should I watch for while using this medication? Your condition will be monitored carefully while you are receiving this medication. This medication may make you feel generally unwell. This is not uncommon as chemotherapy can affect healthy cells as well as cancer cells. Report any side effects. Continue your course of treatment even though you feel ill unless your care team tells you to stop. In some cases, you may be given additional medications to help with side effects. Follow all directions for their use. This medication may increase your risk of getting an infection. Call your care team for advice if you get a fever, chills, sore throat, or other symptoms of a cold or flu. Do not treat yourself. Try to avoid being around people who are sick. This medication may increase your risk to bruise or bleed. Call your care team if you notice any unusual bleeding. Be careful brushing or flossing your teeth or using a toothpick because you may get an infection or bleed more easily. If you have any dental work done, tell your dentist you are receiving this medication. Avoid taking medications that contain aspirin, acetaminophen, ibuprofen, naproxen, or  ketoprofen unless instructed by your care team. These medications may hide a fever. Do not treat diarrhea with over the counter products. Contact your care team if you have diarrhea that lasts more than 2 days or if it is severe and watery. This medication can make you more sensitive to the sun. Keep out of the sun. If you cannot avoid being in the sun, wear protective clothing and sunscreen. Do not use sun lamps, tanning beds, or tanning booths. Talk to your care team if you or  your partner wish to become pregnant or think you might be pregnant. This medication can cause serious birth defects if taken during pregnancy and for 3 months after the last dose. A reliable form of contraception is recommended while taking this medication and for 3 months after the last dose. Talk to your care team about effective forms of contraception. Do not father a child while taking this medication and for 3 months after the last dose. Use a condom while having sex during this time period. Do not breastfeed while taking this medication. This medication may cause infertility. Talk to your care team if you are concerned about your fertility. What side effects may I notice from receiving this medication? Side effects that you should report to your care team as soon as possible: Allergic reactions--skin rash, itching, hives, swelling of the face, lips, tongue, or throat Heart attack--pain or tightness in the chest, shoulders, arms, or jaw, nausea, shortness of breath, cold or clammy skin, feeling faint or lightheaded Heart failure--shortness of breath, swelling of the ankles, feet, or hands, sudden weight gain, unusual weakness or fatigue Heart rhythm changes--fast or irregular heartbeat, dizziness, feeling faint or lightheaded, chest pain, trouble breathing High ammonia level--unusual weakness or fatigue, confusion, loss of appetite, nausea, vomiting, seizures Infection--fever, chills, cough, sore throat, wounds that don't  heal, pain or trouble when passing urine, general feeling of discomfort or being unwell Low red blood cell level--unusual weakness or fatigue, dizziness, headache, trouble breathing Pain, tingling, or numbness in the hands or feet, muscle weakness, change in vision, confusion or trouble speaking, loss of balance or coordination, trouble walking, seizures Redness, swelling, and blistering of the skin over hands and feet Severe or prolonged diarrhea Unusual bruising or bleeding Side effects that usually do not require medical attention (report to your care team if they continue or are bothersome): Dry skin Headache Increased tears Nausea Pain, redness, or swelling with sores inside the mouth or throat Sensitivity to light Vomiting This list may not describe all possible side effects. Call your doctor for medical advice about side effects. You may report side effects to FDA at 1-800-FDA-1088. Where should I keep my medication? This medication is given in a hospital or clinic. It will not be stored at home. NOTE: This sheet is a summary. It may not cover all possible information. If you have questions about this medicine, talk to your doctor, pharmacist, or health care provider.  2023 Elsevier/Gold Standard (2021-06-02 00:00:00)  The chemotherapy medication bag should finish at 46 hours, 96 hours, or 7 days. For example, if your pump is scheduled for 46 hours and it was put on at 4:00 p.m., it should finish at 2:00 p.m. the day it is scheduled to come off regardless of your appointment time.     Estimated time to finish at 11:00 a.m. on Thursday 11/12/2021.   If the display on your pump reads "Low Volume" and it is beeping, take the batteries out of the pump and come to the cancer center for it to be taken off.   If the pump alarms go off prior to the pump reading "Low Volume" then call [phone] and someone can assist you.  If the plunger comes out and the chemotherapy medication is  leaking out, please use your home chemo spill kit to clean up the spill. Do NOT use paper towels or other household products.  If you have problems or questions regarding your pump, please call either [phone] (24 hours a day) or the  cancer center Monday-Friday 8:00 a.m.- 4:30 p.m. at the clinic number and we will assist you. If you are unable to get assistance, then go to the nearest Emergency Department and ask the staff to contact the IV team for assistance.

## 2021-11-10 NOTE — Progress Notes (Signed)
Patient seen by Dr. Benay Spice today  Vitals are within treatment parameters.  Labs reviewed by Dr. Benay Spice CBC diff reviewed and within treatment parameters, CMP pending.  Per physician team, patient is ready for treatment and there are NO modifications to the treatment plan. Per MD Benay Spice, wait for all lab results before proceeding with tx.

## 2021-11-10 NOTE — Progress Notes (Signed)
[place] OFFICE PROGRESS NOTE   Diagnosis: Colon cancer  INTERVAL HISTORY:   Craig Best completed another cycle of FOLFIRI/bevacizumab on 10/27/2021.  He reports mild diarrhea following this cycle of chemotherapy.  He had 1-2 loose stools per day for several days.  He had increased malaise following chemotherapy.  He has noted "soreness "at the left calf for the past week.  No swelling.  No soreness today.  No dyspnea. He had a cold last week with a sore throat.  The sore throat has improved. Objective:  Vital signs in last 24 hours:  Blood pressure 139/76, pulse 83, temperature 98.1 F (36.7 C), resp. rate 18, height _0  (1.727 m), weight 194 lb (88 kg), SpO2 100 %.    HEENT: No thrush or ulcers Resp: Lungs clear bilaterally with decreased breath sounds throughout the left chest, no respiratory distress Cardio: Regular rate and rhythm GI: No hepatosplenomegaly, nontender, left lower quadrant colostomy Vascular: No leg edema.  Left calf without erythema, tenderness, or palpable cord   Portacath/PICC-without erythema  Lab Results:  Lab Results  Component Value Date   WBC 6.8 10/27/2021   HGB 15.1 10/27/2021   HCT 43.8 10/27/2021   MCV 94.2 10/27/2021   PLT 221 10/27/2021   NEUTROABS 3.8 10/27/2021    CMP  Lab Results  Component Value Date   NA 136 10/27/2021   K 4.2 10/27/2021   CL 100 10/27/2021   CO2 27 10/27/2021   GLUCOSE 103 (H) 10/27/2021   BUN 21 10/27/2021   CREATININE 0.81 10/27/2021   CALCIUM 9.2 10/27/2021   PROT 7.0 10/27/2021   ALBUMIN 4.0 10/27/2021   AST 18 10/27/2021   ALT 22 10/27/2021   ALKPHOS 83 10/27/2021   BILITOT 0.4 10/27/2021   GFRNONAA >60 10/27/2021    Lab Results  Component Value Date   CEA1 63.2 (H) 09/19/2020   CEA 21.87 (H) 10/27/2021    Medications: I have reviewed the patient's current medications.   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), status post a sigmoid colectomy and  partial bladder resection 09/19/2020 2 separate sigmoid colon tumors, 2.5 cm apart, 1/13 lymph nodes positive, tumor extends to adherent soft tissue of the bladder, MSS, normal mismatch repair protein expression tumor mutation burden 0, K-ras G12D CT abdomen/pelvis 09/19/2020-sigmoid colon mass with colonic obstruction, 6 mm right lower lobe nodule CT chest 09/22/2020-multiple bilateral lung nodules consistent with metastases Elevated preoperative CEA Cycle 1 FOLFOX 10/28/2020 Cycle 2 FOLFOX 11/11/2020 Cycle 3 FOLFOX 11/25/2020 Cycle 4 FOLFOX 12/09/2020 Cycle 5 FOLFOX 12/23/2020 CT chest 12/25/2020-unchanged lung nodules, 2 cm subpleural nodule in the left posterior chest more prominent-loculated fluid? Cycle 6 FOLFOX 02/03/2021-5-FU dose reduced and bolus eliminated secondary to diarrhea Cycle 7 FOLFOX 02/17/2021 Cycle 8 FOLFOX 03/03/2021 PET 03/17/2021 pulmonary nodules without significant change, 10 mm nodule in right middle lobe with mild FDG uptake, mildly hypermetabolic pleural nodule in the lower left hemothorax, new clustered nodularity in the apical and posterior right upper lobe-likely infectious CTs 07/23/2021-new left pleural effusion with multiple pleural nodules, increased size of several pulmonary nodules, stable left paracolic gutter nodule, morphologic features suggestive of early cirrhosis Thoracentesis 07/24/2021-1200 cc of fluid removed, rare atypical cells present Thoracentesis 627 2023-1.5 L of fluid removed, atypical cells present, acute inflammation and blood Thoracentesis 08/14/2021- 1.2 L of fluid removed, adenocarcinoma compatible with metastatic colon cancer Thoracentesis 08/21/2021-adenocarcinoma Cycle 1 FOLFIRI/bevacizumab 09/01/2021 Cycle 2 FOLFIRI/bevacizumab 09/15/2021 Cycle 3 FOLFIRI/bevacizumab 09/29/2021-Decadron prophylaxis added for delayed nausea Cycle 4 FOLFIRI/bevacizumab  10/13/2021 Cycle 5 FOLFIRI/bevacizumab 10/27/2021 CT chest 10/28/2021-improvement in left pleural  effusion, rule/parenchymal metastases-some are slightly smaller, no new lesions Cycle 6 FOLFIRI/bevacizumab 11/10/2021  2.  Port-A-Cath placement-Dr. 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  He has completed 5 cycles of FOLFIRI/bevacizumab.  He has tolerated the treatment well.  I reviewed the restaging CT findings and images with him.  Several of the lung nodules appear smaller.  No evidence of disease progression.  I recommend continuing FOLFIRI/bevacizumab.  Craig Best has malaise following chemotherapy.  The plan is to continue FOLFIRI/bevacizumab on a 3-week schedule.  He will return for an office visit and chemotherapy in 3 weeks.  We will refer him for a repeat chest CT after 4 additional cycles of FOLFIRI/bevacizumab.  He will call for persistent left leg pain or swelling.  I have a low clinical suspicion for a deep vein thrombosis.  Betsy Coder, MD  11/10/2021  8:14 AM

## 2021-11-11 ENCOUNTER — Other Ambulatory Visit: Payer: Self-pay

## 2021-11-12 ENCOUNTER — Inpatient Hospital Stay: Payer: Medicare Other

## 2021-11-12 VITALS — BP 143/69 | HR 64 | Temp 98.2°F | Resp 20

## 2021-11-12 DIAGNOSIS — Z452 Encounter for adjustment and management of vascular access device: Secondary | ICD-10-CM | POA: Diagnosis not present

## 2021-11-12 DIAGNOSIS — C187 Malignant neoplasm of sigmoid colon: Secondary | ICD-10-CM

## 2021-11-12 DIAGNOSIS — C7802 Secondary malignant neoplasm of left lung: Secondary | ICD-10-CM | POA: Diagnosis not present

## 2021-11-12 DIAGNOSIS — Z5111 Encounter for antineoplastic chemotherapy: Secondary | ICD-10-CM | POA: Diagnosis not present

## 2021-11-12 DIAGNOSIS — Z5112 Encounter for antineoplastic immunotherapy: Secondary | ICD-10-CM | POA: Diagnosis not present

## 2021-11-12 DIAGNOSIS — C7801 Secondary malignant neoplasm of right lung: Secondary | ICD-10-CM | POA: Diagnosis not present

## 2021-11-12 MED ORDER — HEPARIN SOD (PORK) LOCK FLUSH 100 UNIT/ML IV SOLN
500.0000 [IU] | Freq: Once | INTRAVENOUS | Status: AC | PRN
  Administered 2021-11-12: 500 [IU]

## 2021-11-12 MED ORDER — SODIUM CHLORIDE 0.9% FLUSH
10.0000 mL | INTRAVENOUS | Status: DC | PRN
  Administered 2021-11-12: 10 mL

## 2021-11-24 ENCOUNTER — Inpatient Hospital Stay: Payer: Medicare Other

## 2021-11-24 ENCOUNTER — Inpatient Hospital Stay: Payer: Medicare Other | Admitting: Oncology

## 2021-11-26 ENCOUNTER — Inpatient Hospital Stay: Payer: Medicare Other

## 2021-11-29 ENCOUNTER — Other Ambulatory Visit: Payer: Self-pay | Admitting: Oncology

## 2021-12-01 ENCOUNTER — Inpatient Hospital Stay: Payer: Medicare Other

## 2021-12-01 ENCOUNTER — Inpatient Hospital Stay: Payer: Medicare Other | Admitting: Nurse Practitioner

## 2021-12-01 ENCOUNTER — Encounter: Payer: Self-pay | Admitting: Nurse Practitioner

## 2021-12-01 VITALS — BP 140/77 | HR 78 | Temp 98.1°F | Resp 18 | Ht 68.0 in | Wt 193.0 lb

## 2021-12-01 VITALS — BP 144/78 | HR 60 | Resp 20

## 2021-12-01 DIAGNOSIS — C187 Malignant neoplasm of sigmoid colon: Secondary | ICD-10-CM

## 2021-12-01 DIAGNOSIS — Z452 Encounter for adjustment and management of vascular access device: Secondary | ICD-10-CM | POA: Diagnosis not present

## 2021-12-01 DIAGNOSIS — Z5111 Encounter for antineoplastic chemotherapy: Secondary | ICD-10-CM | POA: Diagnosis not present

## 2021-12-01 DIAGNOSIS — Z5112 Encounter for antineoplastic immunotherapy: Secondary | ICD-10-CM | POA: Diagnosis not present

## 2021-12-01 DIAGNOSIS — C7802 Secondary malignant neoplasm of left lung: Secondary | ICD-10-CM | POA: Diagnosis not present

## 2021-12-01 DIAGNOSIS — C7801 Secondary malignant neoplasm of right lung: Secondary | ICD-10-CM | POA: Diagnosis not present

## 2021-12-01 LAB — CMP (CANCER CENTER ONLY)
ALT: 26 U/L (ref 0–44)
AST: 20 U/L (ref 15–41)
Albumin: 4.2 g/dL (ref 3.5–5.0)
Alkaline Phosphatase: 74 U/L (ref 38–126)
Anion gap: 9 (ref 5–15)
BUN: 19 mg/dL (ref 8–23)
CO2: 27 mmol/L (ref 22–32)
Calcium: 9.9 mg/dL (ref 8.9–10.3)
Chloride: 101 mmol/L (ref 98–111)
Creatinine: 0.93 mg/dL (ref 0.61–1.24)
GFR, Estimated: >60 mL/min (ref >60)
Glucose, Bld: 117 mg/dL — ABNORMAL HIGH (ref 70–99)
Potassium: 4.2 mmol/L (ref 3.5–5.1)
Sodium: 137 mmol/L (ref 135–145)
Total Bilirubin: 0.5 mg/dL (ref 0.3–1.2)
Total Protein: 7.2 g/dL (ref 6.5–8.1)

## 2021-12-01 LAB — CBC WITH DIFFERENTIAL (CANCER CENTER ONLY)
Abs Immature Granulocytes: 0.54 10*3/uL — ABNORMAL HIGH (ref 0.00–0.07)
Basophils Absolute: 0.1 10*3/uL (ref 0.0–0.1)
Basophils Relative: 1 %
Eosinophils Absolute: 0 10*3/uL (ref 0.0–0.5)
Eosinophils Relative: 0 %
HCT: 46.7 % (ref 39.0–52.0)
Hemoglobin: 15.9 g/dL (ref 13.0–17.0)
Immature Granulocytes: 4 %
Lymphocytes Relative: 16 %
Lymphs Abs: 2.1 10*3/uL (ref 0.7–4.0)
MCH: 32.9 pg (ref 26.0–34.0)
MCHC: 34 g/dL (ref 30.0–36.0)
MCV: 96.7 fL (ref 80.0–100.0)
Monocytes Absolute: 1.4 10*3/uL — ABNORMAL HIGH (ref 0.1–1.0)
Monocytes Relative: 11 %
Neutro Abs: 8.8 10*3/uL — ABNORMAL HIGH (ref 1.7–7.7)
Neutrophils Relative %: 68 %
Platelet Count: 340 10*3/uL (ref 150–400)
RBC: 4.83 MIL/uL (ref 4.22–5.81)
RDW: 17 % — ABNORMAL HIGH (ref 11.5–15.5)
WBC Count: 13 10*3/uL — ABNORMAL HIGH (ref 4.0–10.5)
nRBC: 0 % (ref 0.0–0.2)

## 2021-12-01 LAB — CEA (ACCESS): CEA (CHCC): 28.13 ng/mL — ABNORMAL HIGH (ref 0.00–5.00)

## 2021-12-01 MED ORDER — SODIUM CHLORIDE 0.9 % IV SOLN: Freq: Once | INTRAVENOUS | Status: AC

## 2021-12-01 MED ORDER — ATROPINE SULFATE 1 MG/ML IV SOLN
0.5000 mg | Freq: Once | INTRAVENOUS | Status: AC | PRN
  Administered 2021-12-01: 0.5 mg via INTRAVENOUS
  Filled 2021-12-01: qty 1

## 2021-12-01 MED ORDER — FLUOROURACIL CHEMO INJECTION 2.5 GM/50ML
300.0000 mg/m2 | Freq: Once | INTRAVENOUS | Status: AC
  Administered 2021-12-01: 600 mg via INTRAVENOUS
  Filled 2021-12-01: qty 12

## 2021-12-01 MED ORDER — SODIUM CHLORIDE 0.9 % IV SOLN
300.0000 mg/m2 | Freq: Once | INTRAVENOUS | Status: AC
  Administered 2021-12-01: 612 mg via INTRAVENOUS
  Filled 2021-12-01: qty 30.6

## 2021-12-01 MED ORDER — SODIUM CHLORIDE 0.9 % IV SOLN
7.5000 mg/kg | Freq: Once | INTRAVENOUS | Status: AC
  Administered 2021-12-01: 700 mg via INTRAVENOUS
  Filled 2021-12-01: qty 16

## 2021-12-01 MED ORDER — SODIUM CHLORIDE 0.9 % IV SOLN
180.0000 mg/m2 | Freq: Once | INTRAVENOUS | Status: AC
  Administered 2021-12-01: 360 mg via INTRAVENOUS
  Filled 2021-12-01: qty 4

## 2021-12-01 MED ORDER — DEXAMETHASONE 4 MG PO TABS: 4.0000 mg | ORAL_TABLET | ORAL | 1 refills | Status: DC

## 2021-12-01 MED ORDER — PALONOSETRON HCL INJECTION 0.25 MG/5ML
0.2500 mg | Freq: Once | INTRAVENOUS | Status: AC
  Administered 2021-12-01: 0.25 mg via INTRAVENOUS
  Filled 2021-12-01: qty 5

## 2021-12-01 MED ORDER — LIDOCAINE-PRILOCAINE 2.5-2.5 % EX CREA: 1.0000 | TOPICAL_CREAM | CUTANEOUS | 2 refills | Status: DC | PRN

## 2021-12-01 MED ORDER — SODIUM CHLORIDE 0.9 % IV SOLN
2000.0000 mg/m2 | INTRAVENOUS | Status: DC
  Administered 2021-12-01: 4100 mg via INTRAVENOUS
  Filled 2021-12-01: qty 82

## 2021-12-01 MED ORDER — SODIUM CHLORIDE 0.9 % IV SOLN
10.0000 mg | Freq: Once | INTRAVENOUS | Status: AC
  Administered 2021-12-01: 10 mg via INTRAVENOUS
  Filled 2021-12-01: qty 10

## 2021-12-01 NOTE — Progress Notes (Signed)
[facility] OFFICE PROGRESS NOTE   Diagnosis: Colon cancer  INTERVAL HISTORY:   Craig Best returns as scheduled.  He completed cycle 6 FOLFIRI/bevacizumab 11/10/2021.  He feels well.  No nausea/vomiting.  No mouth sores.  No diarrhea.  No bleeding.  No hand or foot pain or redness.  He denies shortness of breath.  He has a good appetite.  He remains very active.  Objective:  Vital signs in last 24 hours:  Blood pressure (!) 140/77, pulse 78, temperature 98.1 F (36.7 C), temperature source Oral, resp. rate 18, height _0  (1.727 m), weight 193 lb (87.5 kg), SpO2 98 %.    HEENT: No thrush or ulcers. Resp: Lungs clear bilaterally.  Breath sounds mildly decreased left lung field.  No respiratory distress. Cardio: Regular rate and rhythm. GI: Abdomen soft and nontender.  No hepatosplenomegaly.  Left lower quadrant colostomy. Vascular: No leg edema. Skin: Palms with skin thickening, hyperpigmentation.  No erythema or skin breakdown. Port-A-Cath without erythema.   Lab Results:  Lab Results  Component Value Date   WBC 13.0 (H) 12/01/2021   HGB 15.9 12/01/2021   HCT 46.7 12/01/2021   MCV 96.7 12/01/2021   PLT 340 12/01/2021   NEUTROABS 8.8 (H) 12/01/2021    Imaging:  No results found.  Medications: I have reviewed the patient's current medications.  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), status post a sigmoid colectomy and partial bladder resection 09/19/2020 2 separate sigmoid colon tumors, 2.5 cm apart, 1/13 lymph nodes positive, tumor extends to adherent soft tissue of the bladder, MSS, normal mismatch repair protein expression tumor mutation burden 0, K-ras G12D CT abdomen/pelvis 09/19/2020-sigmoid colon mass with colonic obstruction, 6 mm right lower lobe nodule CT chest 09/22/2020-multiple bilateral lung nodules consistent with metastases Elevated preoperative CEA Cycle 1 FOLFOX 10/28/2020 Cycle 2 FOLFOX 11/11/2020 Cycle 3 FOLFOX  11/25/2020 Cycle 4 FOLFOX 12/09/2020 Cycle 5 FOLFOX 12/23/2020 CT chest 12/25/2020-unchanged lung nodules, 2 cm subpleural nodule in the left posterior chest more prominent-loculated fluid? Cycle 6 FOLFOX 02/03/2021-5-FU dose reduced and bolus eliminated secondary to diarrhea Cycle 7 FOLFOX 02/17/2021 Cycle 8 FOLFOX 03/03/2021 PET 03/17/2021 pulmonary nodules without significant change, 10 mm nodule in right middle lobe with mild FDG uptake, mildly hypermetabolic pleural nodule in the lower left hemothorax, new clustered nodularity in the apical and posterior right upper lobe-likely infectious CTs 07/23/2021-new left pleural effusion with multiple pleural nodules, increased size of several pulmonary nodules, stable left paracolic gutter nodule, morphologic features suggestive of early cirrhosis Thoracentesis 07/24/2021-1200 cc of fluid removed, rare atypical cells present Thoracentesis 627 2023-1.5 L of fluid removed, atypical cells present, acute inflammation and blood Thoracentesis 08/14/2021- 1.2 L of fluid removed, adenocarcinoma compatible with metastatic colon cancer Thoracentesis 08/21/2021-adenocarcinoma Cycle 1 FOLFIRI/bevacizumab 09/01/2021 Cycle 2 FOLFIRI/bevacizumab 09/15/2021 Cycle 3 FOLFIRI/bevacizumab 09/29/2021-Decadron prophylaxis added for delayed nausea Cycle 4 FOLFIRI/bevacizumab 10/13/2021 Cycle 5 FOLFIRI/bevacizumab 10/27/2021 CT chest 10/28/2021-improvement in left pleural effusion, rule/parenchymal metastases-some are slightly smaller, no new lesions Cycle 6 FOLFIRI/bevacizumab 11/10/2021 Cycle 7 FOLFIRI/bevacizumab 12/01/2021  2.  Port-A-Cath placement-Dr. 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 Frye appears stable.  He has completed 6 cycles of FOLFIRI/bevacizumab.  He continues to tolerate treatment well.  Plan to proceed with cycle 7 today as scheduled.  CBC  reviewed.  Counts adequate to proceed as above.  He will return for lab, follow-up, cycle 8 FOLFIRI/bevacizumab  in 3 weeks.  He will contact the office in the interim with any problems.    Ned Card ANP/GNP-BC   12/01/2021  9:06 AM

## 2021-12-01 NOTE — Patient Instructions (Signed)
[facility]   Discharge Instructions: Thank you for choosing [facility] to provide your oncology and hematology care.   If you have a lab appointment with the [facility], please go directly to the [facility] and check in at the registration area.   Wear comfortable clothing and clothing appropriate for easy access to any Portacath or PICC line.   We strive to give you quality time with your provider. You may need to reschedule your appointment if you arrive late (15 or more minutes).  Arriving late affects you and other patients whose appointments are after yours.  Also, if you miss three or more appointments without notifying the office, you may be dismissed from the clinic at the provider's discretion.      For prescription refill requests, have your pharmacy contact our office and allow 72 hours for refills to be completed.    Today you received the following chemotherapy and/or immunotherapy agents Bevacizumab-bvzr (ZIRABEV), Irinotecan (CAMPTOSAR), Leucovorin & Flourouracil (ADRUCIL).      To help prevent nausea and vomiting after your treatment, we encourage you to take your nausea medication as directed.  BELOW ARE SYMPTOMS THAT SHOULD BE REPORTED IMMEDIATELY: *FEVER GREATER THAN 100.4 F (38 C) OR HIGHER *CHILLS OR SWEATING *NAUSEA AND VOMITING THAT IS NOT CONTROLLED WITH YOUR NAUSEA MEDICATION *UNUSUAL SHORTNESS OF BREATH *UNUSUAL BRUISING OR BLEEDING *URINARY PROBLEMS (pain or burning when urinating, or frequent urination) *BOWEL PROBLEMS (unusual diarrhea, constipation, pain near the anus) TENDERNESS IN MOUTH AND THROAT WITH OR WITHOUT PRESENCE OF ULCERS (sore throat, sores in mouth, or a toothache) UNUSUAL RASH, SWELLING OR PAIN  UNUSUAL VAGINAL DISCHARGE OR ITCHING   Items with * indicate a potential emergency and should be followed up as soon as possible or go to the Emergency Department if any problems should occur.  Please  show the CHEMOTHERAPY ALERT CARD or IMMUNOTHERAPY ALERT CARD at check-in to the Emergency Department and triage nurse.  Should you have questions after your visit or need to cancel or reschedule your appointment, please contact [facility]  Dept: [phone]  and follow the prompts.  Office hours are 8:00 a.m. to 4:30 p.m. Monday - Friday. Please note that voicemails left after 4:00 p.m. may not be returned until the following business day.  We are closed weekends and major holidays. You have access to a nurse at all times for urgent questions. Please call the main number to the clinic Dept: [phone] and follow the prompts.   For any non-urgent questions, you may also contact your provider using MyChart. We now offer e-Visits for anyone 2 and older to request care online for non-urgent symptoms. For details visit [email or web address].   Also download the MyChart app! Go to the app store, search "MyChart", open the app, select [facility], and log in with your MyChart username and password.  Masks are optional in the cancer centers. If you would like for your care team to wear a mask while they are taking care of you, please let them know. You may have one support person who is at least 79 years old accompany you for your appointments.  Bevacizumab Injection What is this medication? BEVACIZUMAB (be va SIZ yoo mab) treats some types of cancer. It works by blocking a protein that causes cancer cells to grow and multiply. This helps to slow or stop the spread of cancer cells. It is a monoclonal antibody. This medicine may be used for  other purposes; ask your health care provider or pharmacist if you have questions. COMMON BRAND NAME(S): Alymsys, Avastin, MVASI, Noah Charon What should I tell my care team before I take this medication? They need to know if you have any of these conditions: Blood clots Coughing up blood Having or recent surgery Heart failure High blood  pressure History of a connection between 2 or more body parts that do not usually connect (fistula) History of a tear in your stomach or intestines Protein in your urine An unusual or allergic reaction to bevacizumab, other medications, foods, dyes, or preservatives Pregnant or trying to get pregnant Breast-feeding How should I use this medication? This medication is injected into a vein. It is given by your care team in a hospital or clinic setting. Talk to your care team the use of this medication in children. Special care may be needed. Overdosage: If you think you have taken too much of this medicine contact a poison control center or emergency room at once. NOTE: This medicine is only for you. Do not share this medicine with others. What if I miss a dose? Keep appointments for follow-up doses. It is important not to miss your dose. Call your care team if you are unable to keep an appointment. What may interact with this medication? Interactions are not expected. This list may not describe all possible interactions. Give your health care provider a list of all the medicines, herbs, non-prescription drugs, or dietary supplements you use. Also tell them if you smoke, drink alcohol, or use illegal drugs. Some items may interact with your medicine. What should I watch for while using this medication? Your condition will be monitored carefully while you are receiving this medication. You may need blood work while taking this medication. This medication may make you feel generally unwell. This is not uncommon as chemotherapy can affect healthy cells as well as cancer cells. Report any side effects. Continue your course of treatment even though you feel ill unless your care team tells you to stop. This medication may increase your risk to bruise or bleed. Call your care team if you notice any unusual bleeding. Before having surgery, talk to your care team to make sure it is ok. This medication can  increase the risk of poor healing of your surgical site or wound. You will need to stop this medication for 28 days before surgery. After surgery, wait at least 28 days before restarting this medication. Make sure the surgical site or wound is healed enough before restarting this medication. Talk to your care team if questions. Talk to your care team if you may be pregnant. Serious birth defects can occur if you take this medication during pregnancy and for 6 months after the last dose. Contraception is recommended while taking this medication and for 6 months after the last dose. Your care team can help you find the option that works for you. Do not breastfeed while taking this medication and for 6 months after the last dose. This medication can cause infertility. Talk to your care team if you are concerned about your fertility. What side effects may I notice from receiving this medication? Side effects that you should report to your care team as soon as possible: Allergic reactions--skin rash, itching, hives, swelling of the face, lips, tongue, or throat Bleeding--bloody or black, tar-like stools, vomiting blood or brown material that looks like coffee grounds, red or dark brown urine, small red or purple spots on skin, unusual bruising or bleeding  Blood clot--pain, swelling, or warmth in the leg, shortness of breath, chest pain Heart attack--pain or tightness in the chest, shoulders, arms, or jaw, nausea, shortness of breath, cold or clammy skin, feeling faint or lightheaded Heart failure--shortness of breath, swelling of the ankles, feet, or hands, sudden weight gain, unusual weakness or fatigue Increase in blood pressure Infection--fever, chills, cough, sore throat, wounds that don't heal, pain or trouble when passing urine, general feeling of discomfort or being unwell Infusion reactions--chest pain, shortness of breath or trouble breathing, feeling faint or lightheaded Kidney injury--decrease in  the amount of urine, swelling of the ankles, hands, or feet Stomach pain that is severe, does not go away, or gets worse Stroke--sudden numbness or weakness of the face, arm, or leg, trouble speaking, confusion, trouble walking, loss of balance or coordination, dizziness, severe headache, change in vision Sudden and severe headache, confusion, change in vision, seizures, which may be signs of posterior reversible encephalopathy syndrome (PRES) Side effects that usually do not require medical attention (report to your care team if they continue or are bothersome): Back pain Change in taste Diarrhea Dry skin Increased tears Nosebleed This list may not describe all possible side effects. Call your doctor for medical advice about side effects. You may report side effects to FDA at 1-800-FDA-1088. Where should I keep my medication? This medication is given in a hospital or clinic. It will not be stored at home. NOTE: This sheet is a summary. It may not cover all possible information. If you have questions about this medicine, talk to your doctor, pharmacist, or health care provider.  2023 Elsevier/Gold Standard (2021-06-09 00:00:00)  Irinotecan Injection What is this medication? IRINOTECAN (ir in oh TEE kan) treats some types of cancer. It works by slowing down the growth of cancer cells. This medicine may be used for other purposes; ask your health care provider or pharmacist if you have questions. COMMON BRAND NAME(S): Camptosar What should I tell my care team before I take this medication? They need to know if you have any of these conditions: Dehydration Diarrhea Infection, especially a viral infection, such as chickenpox, cold sores, herpes Liver disease Low blood cell levels (white cells, red cells, and platelets) Low levels of electrolytes, such as calcium, magnesium, or potassium in your blood Recent or ongoing radiation An unusual or allergic reaction to irinotecan, other  medications, foods, dyes, or preservatives If you or your partner are pregnant or trying to get pregnant Breast-feeding How should I use this medication? This medication is injected into a vein. It is given by your care team in a hospital or clinic setting. Talk to your care team about the use of this medication in children. Special care may be needed. Overdosage: If you think you have taken too much of this medicine contact a poison control center or emergency room at once. NOTE: This medicine is only for you. Do not share this medicine with others. What if I miss a dose? Keep appointments for follow-up doses. It is important not to miss your dose. Call your care team if you are unable to keep an appointment. What may interact with this medication? Do not take this medication with any of the following: Cobicistat Itraconazole This medication may also interact with the following: Certain antibiotics, such as clarithromycin, rifampin, rifabutin Certain antivirals for HIV or AIDS Certain medications for fungal infections, such as ketoconazole, posaconazole, voriconazole Certain medications for seizures, such as carbamazepine, phenobarbital, phenytoin Gemfibrozil Nefazodone St. John's wort This list may  not describe all possible interactions. Give your health care provider a list of all the medicines, herbs, non-prescription drugs, or dietary supplements you use. Also tell them if you smoke, drink alcohol, or use illegal drugs. Some items may interact with your medicine. What should I watch for while using this medication? Your condition will be monitored carefully while you are receiving this medication. You may need blood work while taking this medication. This medication may make you feel generally unwell. This is not uncommon as chemotherapy can affect healthy cells as well as cancer cells. Report any side effects. Continue your course of treatment even though you feel ill unless your care  team tells you to stop. This medication can cause serious side effects. To reduce the risk, your care team may give you other medications to take before receiving this one. Be sure to follow the directions from your care team. This medication may affect your coordination, reaction time, or judgement. Do not drive or operate machinery until you know how this medication affects you. Sit up or stand slowly to reduce the risk of dizzy or fainting spells. Drinking alcohol with this medication can increase the risk of these side effects. This medication may increase your risk of getting an infection. Call your care team for advice if you get a fever, chills, sore throat, or other symptoms of a cold or flu. Do not treat yourself. Try to avoid being around people who are sick. Avoid taking medications that contain aspirin, acetaminophen, ibuprofen, naproxen, or ketoprofen unless instructed by your care team. These medications may hide a fever. This medication may increase your risk to bruise or bleed. Call your care team if you notice any unusual bleeding. Be careful brushing or flossing your teeth or using a toothpick because you may get an infection or bleed more easily. If you have any dental work done, tell your dentist you are receiving this medication. Talk to your care team if you or your partner are pregnant or think either of you might be pregnant. This medication can cause serious birth defects if taken during pregnancy and for 6 months after the last dose. You will need a negative pregnancy test before starting this medication. Contraception is recommended while taking this medication and for 6 months after the last dose. Your care team can help you find the option that works for you. Do not father a child while taking this medication and for 3 months after the last dose. Use a condom for contraception during this time period. Do not breastfeed while taking this medication and for 7 days after the last  dose. This medication may cause infertility. Talk to your care team if you are concerned about your fertility. What side effects may I notice from receiving this medication? Side effects that you should report to your care team as soon as possible: Allergic reactions--skin rash, itching, hives, swelling of the face, lips, tongue, or throat Dry cough, shortness of breath or trouble breathing Increased saliva or tears, increased sweating, stomach cramping, diarrhea, small pupils, unusual weakness or fatigue, slow heartbeat Infection--fever, chills, cough, sore throat, wounds that don't heal, pain or trouble when passing urine, general feeling of discomfort or being unwell Kidney injury--decrease in the amount of urine, swelling of the ankles, hands, or feet Low red blood cell level--unusual weakness or fatigue, dizziness, headache, trouble breathing Severe or prolonged diarrhea Unusual bruising or bleeding Side effects that usually do not require medical attention (report to your care team if they  continue or are bothersome): Constipation Diarrhea Hair loss Loss of appetite Nausea Stomach pain This list may not describe all possible side effects. Call your doctor for medical advice about side effects. You may report side effects to FDA at 1-800-FDA-1088. Where should I keep my medication? This medication is given in a hospital or clinic. It will not be stored at home. NOTE: This sheet is a summary. It may not cover all possible information. If you have questions about this medicine, talk to your doctor, pharmacist, or health care provider.  2023 Elsevier/Gold Standard (2021-06-04 00:00:00)  Leucovorin Injection What is this medication? LEUCOVORIN (loo koe VOR in) prevents side effects from certain medications, such as methotrexate. It works by increasing folate levels. This helps protect healthy cells in your body. It may also be used to treat anemia caused by low levels of folate. It can  also be used with fluorouracil, a type of chemotherapy, to treat colorectal cancer. It works by increasing the effects of fluorouracil in the body. This medicine may be used for other purposes; ask your health care provider or pharmacist if you have questions. What should I tell my care team before I take this medication? They need to know if you have any of these conditions: Anemia from low levels of vitamin B12 in the blood An unusual or allergic reaction to leucovorin, folic acid, other medications, foods, dyes, or preservatives Pregnant or trying to get pregnant Breastfeeding How should I use this medication? This medication is injected into a vein or a muscle. It is given by your care team in a hospital or clinic setting. Talk to your care team about the use of this medication in children. Special care may be needed. Overdosage: If you think you have taken too much of this medicine contact a poison control center or emergency room at once. NOTE: This medicine is only for you. Do not share this medicine with others. What if I miss a dose? Keep appointments for follow-up doses. It is important not to miss your dose. Call your care team if you are unable to keep an appointment. What may interact with this medication? Capecitabine Fluorouracil Phenobarbital Phenytoin Primidone Trimethoprim;sulfamethoxazole This list may not describe all possible interactions. Give your health care provider a list of all the medicines, herbs, non-prescription drugs, or dietary supplements you use. Also tell them if you smoke, drink alcohol, or use illegal drugs. Some items may interact with your medicine. What should I watch for while using this medication? Your condition will be monitored carefully while you are receiving this medication. This medication may increase the side effects of 5-fluorouracil. Tell your care team if you have diarrhea or mouth sores that do not get better or that get worse. What  side effects may I notice from receiving this medication? Side effects that you should report to your care team as soon as possible: Allergic reactions--skin rash, itching, hives, swelling of the face, lips, tongue, or throat This list may not describe all possible side effects. Call your doctor for medical advice about side effects. You may report side effects to FDA at 1-800-FDA-1088. Where should I keep my medication? This medication is given in a hospital or clinic. It will not be stored at home. NOTE: This sheet is a summary. It may not cover all possible information. If you have questions about this medicine, talk to your doctor, pharmacist, or health care provider.  2023 Elsevier/Gold Standard (2021-06-05 00:00:00)  Fluorouracil Injection What is this medication? FLUOROURACIL (  flure oh YOOR a sil) treats some types of cancer. It works by slowing down the growth of cancer cells. This medicine may be used for other purposes; ask your health care provider or pharmacist if you have questions. COMMON BRAND NAME(S): Adrucil What should I tell my care team before I take this medication? They need to know if you have any of these conditions: Blood disorders Dihydropyrimidine dehydrogenase (DPD) deficiency Infection, such as chickenpox, cold sores, herpes Kidney disease Liver disease Poor nutrition Recent or ongoing radiation therapy An unusual or allergic reaction to fluorouracil, other medications, foods, dyes, or preservatives If you or your partner are pregnant or trying to get pregnant Breast-feeding How should I use this medication? This medication is injected into a vein. It is administered by your care team in a hospital or clinic setting. Talk to your care team about the use of this medication in children. Special care may be needed. Overdosage: If you think you have taken too much of this medicine contact a poison control center or emergency room at once. NOTE: This medicine is  only for you. Do not share this medicine with others. What if I miss a dose? Keep appointments for follow-up doses. It is important not to miss your dose. Call your care team if you are unable to keep an appointment. What may interact with this medication? Do not take this medication with any of the following: Live virus vaccines This medication may also interact with the following: Medications that treat or prevent blood clots, such as warfarin, enoxaparin, dalteparin This list may not describe all possible interactions. Give your health care provider a list of all the medicines, herbs, non-prescription drugs, or dietary supplements you use. Also tell them if you smoke, drink alcohol, or use illegal drugs. Some items may interact with your medicine. What should I watch for while using this medication? Your condition will be monitored carefully while you are receiving this medication. This medication may make you feel generally unwell. This is not uncommon as chemotherapy can affect healthy cells as well as cancer cells. Report any side effects. Continue your course of treatment even though you feel ill unless your care team tells you to stop. In some cases, you may be given additional medications to help with side effects. Follow all directions for their use. This medication may increase your risk of getting an infection. Call your care team for advice if you get a fever, chills, sore throat, or other symptoms of a cold or flu. Do not treat yourself. Try to avoid being around people who are sick. This medication may increase your risk to bruise or bleed. Call your care team if you notice any unusual bleeding. Be careful brushing or flossing your teeth or using a toothpick because you may get an infection or bleed more easily. If you have any dental work done, tell your dentist you are receiving this medication. Avoid taking medications that contain aspirin, acetaminophen, ibuprofen, naproxen, or  ketoprofen unless instructed by your care team. These medications may hide a fever. Do not treat diarrhea with over the counter products. Contact your care team if you have diarrhea that lasts more than 2 days or if it is severe and watery. This medication can make you more sensitive to the sun. Keep out of the sun. If you cannot avoid being in the sun, wear protective clothing and sunscreen. Do not use sun lamps, tanning beds, or tanning booths. Talk to your care team if you or  your partner wish to become pregnant or think you might be pregnant. This medication can cause serious birth defects if taken during pregnancy and for 3 months after the last dose. A reliable form of contraception is recommended while taking this medication and for 3 months after the last dose. Talk to your care team about effective forms of contraception. Do not father a child while taking this medication and for 3 months after the last dose. Use a condom while having sex during this time period. Do not breastfeed while taking this medication. This medication may cause infertility. Talk to your care team if you are concerned about your fertility. What side effects may I notice from receiving this medication? Side effects that you should report to your care team as soon as possible: Allergic reactions--skin rash, itching, hives, swelling of the face, lips, tongue, or throat Heart attack--pain or tightness in the chest, shoulders, arms, or jaw, nausea, shortness of breath, cold or clammy skin, feeling faint or lightheaded Heart failure--shortness of breath, swelling of the ankles, feet, or hands, sudden weight gain, unusual weakness or fatigue Heart rhythm changes--fast or irregular heartbeat, dizziness, feeling faint or lightheaded, chest pain, trouble breathing High ammonia level--unusual weakness or fatigue, confusion, loss of appetite, nausea, vomiting, seizures Infection--fever, chills, cough, sore throat, wounds that don't  heal, pain or trouble when passing urine, general feeling of discomfort or being unwell Low red blood cell level--unusual weakness or fatigue, dizziness, headache, trouble breathing Pain, tingling, or numbness in the hands or feet, muscle weakness, change in vision, confusion or trouble speaking, loss of balance or coordination, trouble walking, seizures Redness, swelling, and blistering of the skin over hands and feet Severe or prolonged diarrhea Unusual bruising or bleeding Side effects that usually do not require medical attention (report to your care team if they continue or are bothersome): Dry skin Headache Increased tears Nausea Pain, redness, or swelling with sores inside the mouth or throat Sensitivity to light Vomiting This list may not describe all possible side effects. Call your doctor for medical advice about side effects. You may report side effects to FDA at 1-800-FDA-1088. Where should I keep my medication? This medication is given in a hospital or clinic. It will not be stored at home. NOTE: This sheet is a summary. It may not cover all possible information. If you have questions about this medicine, talk to your doctor, pharmacist, or health care provider.  2023 [employer]/Gold Standard (2021-06-02 00:00:00)  The chemotherapy medication bag should finish at 46 hours, 96 hours, or 7 days. For example, if your pump is scheduled for 46 hours and it was put on at 4:00 p.m., it should finish at 2:00 p.m. the day it is scheduled to come off regardless of your appointment time.     Estimated time to finish at 11:00 a.m. on Thursday 12/03/2021.   If the display on your pump reads "Low Volume" and it is beeping, take the batteries out of the pump and come to the cancer center for it to be taken off.   If the pump alarms go off prior to the pump reading "Low Volume" then call [phone] and someone can assist you.  If the plunger comes out and the chemotherapy medication is  leaking out, please use your home chemo spill kit to clean up the spill. Do NOT use paper towels or other household products.  If you have problems or questions regarding your pump, please call either [phone] (24 hours a day) or the  cancer center Monday-Friday 8:00 a.m.- 4:30 p.m. at the clinic number and we will assist you. If you are unable to get assistance, then go to the nearest Emergency Department and ask the staff to contact the IV team for assistance.

## 2021-12-01 NOTE — Progress Notes (Signed)
Patient seen by Lisa Thomas NP today  Vitals are within treatment parameters.  Labs reviewed by Lisa Thomas NP and are within treatment parameters.  Per physician team, patient is ready for treatment and there are NO modifications to the treatment plan.     

## 2021-12-02 ENCOUNTER — Other Ambulatory Visit: Payer: Self-pay

## 2021-12-03 ENCOUNTER — Inpatient Hospital Stay: Payer: Medicare Other

## 2021-12-03 VITALS — BP 131/62 | HR 70 | Temp 98.1°F | Resp 20

## 2021-12-03 DIAGNOSIS — Z5112 Encounter for antineoplastic immunotherapy: Secondary | ICD-10-CM | POA: Diagnosis not present

## 2021-12-03 DIAGNOSIS — Z452 Encounter for adjustment and management of vascular access device: Secondary | ICD-10-CM | POA: Diagnosis not present

## 2021-12-03 DIAGNOSIS — C187 Malignant neoplasm of sigmoid colon: Secondary | ICD-10-CM | POA: Diagnosis not present

## 2021-12-03 DIAGNOSIS — C7801 Secondary malignant neoplasm of right lung: Secondary | ICD-10-CM | POA: Diagnosis not present

## 2021-12-03 DIAGNOSIS — C7802 Secondary malignant neoplasm of left lung: Secondary | ICD-10-CM | POA: Diagnosis not present

## 2021-12-03 DIAGNOSIS — Z5111 Encounter for antineoplastic chemotherapy: Secondary | ICD-10-CM | POA: Diagnosis not present

## 2021-12-03 MED ORDER — SODIUM CHLORIDE 0.9% FLUSH
10.0000 mL | INTRAVENOUS | Status: DC | PRN
  Administered 2021-12-03: 10 mL

## 2021-12-03 MED ORDER — HEPARIN SOD (PORK) LOCK FLUSH 100 UNIT/ML IV SOLN
500.0000 [IU] | Freq: Once | INTRAVENOUS | Status: AC | PRN
  Administered 2021-12-03: 500 [IU]

## 2021-12-03 NOTE — Patient Instructions (Signed)

## 2021-12-20 ENCOUNTER — Other Ambulatory Visit: Payer: Self-pay | Admitting: Oncology

## 2021-12-22 ENCOUNTER — Inpatient Hospital Stay: Payer: Medicare Other

## 2021-12-22 ENCOUNTER — Inpatient Hospital Stay: Payer: Medicare Other | Admitting: Oncology

## 2021-12-22 ENCOUNTER — Encounter: Payer: Self-pay | Admitting: *Deleted

## 2021-12-22 ENCOUNTER — Inpatient Hospital Stay: Payer: Medicare Other | Attending: Oncology | Admitting: *Deleted

## 2021-12-22 VITALS — BP 143/72 | HR 66

## 2021-12-22 DIAGNOSIS — Z452 Encounter for adjustment and management of vascular access device: Secondary | ICD-10-CM | POA: Insufficient documentation

## 2021-12-22 DIAGNOSIS — Z5111 Encounter for antineoplastic chemotherapy: Secondary | ICD-10-CM | POA: Insufficient documentation

## 2021-12-22 DIAGNOSIS — Z5112 Encounter for antineoplastic immunotherapy: Secondary | ICD-10-CM | POA: Diagnosis not present

## 2021-12-22 DIAGNOSIS — C7801 Secondary malignant neoplasm of right lung: Secondary | ICD-10-CM | POA: Insufficient documentation

## 2021-12-22 DIAGNOSIS — C187 Malignant neoplasm of sigmoid colon: Secondary | ICD-10-CM | POA: Diagnosis not present

## 2021-12-22 DIAGNOSIS — C7802 Secondary malignant neoplasm of left lung: Secondary | ICD-10-CM | POA: Diagnosis not present

## 2021-12-22 LAB — CMP (CANCER CENTER ONLY)
ALT: 14 U/L (ref 0–44)
AST: 17 U/L (ref 15–41)
Albumin: 4 g/dL (ref 3.5–5.0)
Alkaline Phosphatase: 62 U/L (ref 38–126)
Anion gap: 9 (ref 5–15)
BUN: 12 mg/dL (ref 8–23)
CO2: 27 mmol/L (ref 22–32)
Calcium: 9.2 mg/dL (ref 8.9–10.3)
Chloride: 103 mmol/L (ref 98–111)
Creatinine: 0.82 mg/dL (ref 0.61–1.24)
GFR, Estimated: >60 mL/min (ref >60)
Glucose, Bld: 110 mg/dL — ABNORMAL HIGH (ref 70–99)
Potassium: 3.8 mmol/L (ref 3.5–5.1)
Sodium: 139 mmol/L (ref 135–145)
Total Bilirubin: 0.4 mg/dL (ref 0.3–1.2)
Total Protein: 7.3 g/dL (ref 6.5–8.1)

## 2021-12-22 LAB — CBC WITH DIFFERENTIAL (CANCER CENTER ONLY)
Abs Immature Granulocytes: 0.08 10*3/uL — ABNORMAL HIGH (ref 0.00–0.07)
Basophils Absolute: 0.1 10*3/uL (ref 0.0–0.1)
Basophils Relative: 2 %
Eosinophils Absolute: 0.4 10*3/uL (ref 0.0–0.5)
Eosinophils Relative: 6 %
HCT: 41.5 % (ref 39.0–52.0)
Hemoglobin: 14.3 g/dL (ref 13.0–17.0)
Immature Granulocytes: 1 %
Lymphocytes Relative: 34 %
Lymphs Abs: 2.1 10*3/uL (ref 0.7–4.0)
MCH: 33.6 pg (ref 26.0–34.0)
MCHC: 34.5 g/dL (ref 30.0–36.0)
MCV: 97.4 fL (ref 80.0–100.0)
Monocytes Absolute: 0.8 10*3/uL (ref 0.1–1.0)
Monocytes Relative: 13 %
Neutro Abs: 2.7 10*3/uL (ref 1.7–7.7)
Neutrophils Relative %: 44 %
Platelet Count: 414 10*3/uL — ABNORMAL HIGH (ref 150–400)
RBC: 4.26 MIL/uL (ref 4.22–5.81)
RDW: 15.6 % — ABNORMAL HIGH (ref 11.5–15.5)
WBC Count: 6.3 10*3/uL (ref 4.0–10.5)
nRBC: 0 % (ref 0.0–0.2)

## 2021-12-22 LAB — CEA (ACCESS): CEA (CHCC): 23.43 ng/mL — ABNORMAL HIGH (ref 0.00–5.00)

## 2021-12-22 LAB — TOTAL PROTEIN, URINE DIPSTICK: Protein, ur: NEGATIVE mg/dL

## 2021-12-22 MED ORDER — FLUOROURACIL CHEMO INJECTION 2.5 GM/50ML
300.0000 mg/m2 | Freq: Once | INTRAVENOUS | Status: AC
  Administered 2021-12-22: 600 mg via INTRAVENOUS
  Filled 2021-12-22: qty 12

## 2021-12-22 MED ORDER — SODIUM CHLORIDE 0.9 % IV SOLN
180.0000 mg/m2 | Freq: Once | INTRAVENOUS | Status: AC
  Administered 2021-12-22: 360 mg via INTRAVENOUS
  Filled 2021-12-22: qty 3

## 2021-12-22 MED ORDER — ATROPINE SULFATE 1 MG/ML IV SOLN
0.5000 mg | Freq: Once | INTRAVENOUS | Status: AC | PRN
  Administered 2021-12-22: 0.5 mg via INTRAVENOUS
  Filled 2021-12-22: qty 1

## 2021-12-22 MED ORDER — PALONOSETRON HCL INJECTION 0.25 MG/5ML
0.2500 mg | Freq: Once | INTRAVENOUS | Status: AC
  Administered 2021-12-22: 0.25 mg via INTRAVENOUS
  Filled 2021-12-22: qty 5

## 2021-12-22 MED ORDER — SODIUM CHLORIDE 0.9 % IV SOLN
2000.0000 mg/m2 | INTRAVENOUS | Status: DC
  Administered 2021-12-22: 4100 mg via INTRAVENOUS
  Filled 2021-12-22: qty 82

## 2021-12-22 MED ORDER — PROCHLORPERAZINE MALEATE 10 MG PO TABS: 10.0000 mg | ORAL_TABLET | Freq: Four times a day (QID) | ORAL | 1 refills | Status: DC | PRN

## 2021-12-22 MED ORDER — SODIUM CHLORIDE 0.9 % IV SOLN
10.0000 mg | Freq: Once | INTRAVENOUS | Status: AC
  Administered 2021-12-22: 10 mg via INTRAVENOUS
  Filled 2021-12-22: qty 1

## 2021-12-22 MED ORDER — SODIUM CHLORIDE 0.9 % IV SOLN: Freq: Once | INTRAVENOUS | Status: AC

## 2021-12-22 MED ORDER — SODIUM CHLORIDE 0.9 % IV SOLN
7.5000 mg/kg | Freq: Once | INTRAVENOUS | Status: AC
  Administered 2021-12-22: 700 mg via INTRAVENOUS
  Filled 2021-12-22: qty 16

## 2021-12-22 MED ORDER — SODIUM CHLORIDE 0.9 % IV SOLN
300.0000 mg/m2 | Freq: Once | INTRAVENOUS | Status: AC
  Administered 2021-12-22: 612 mg via INTRAVENOUS
  Filled 2021-12-22: qty 30.6

## 2021-12-22 NOTE — Progress Notes (Signed)
[place] OFFICE PROGRESS NOTE   Diagnosis: Colon cancer  INTERVAL HISTORY:   [name] returns as scheduled.  He completed another cycle of FOLFIRI/Avastin on 12/01/2021.  He reports increased malaise following this cycle of chemotherapy.  This has improved.  He is working.  He had a few episodes of diarrhea and mild nausea following chemotherapy.  No emesis.  Good appetite.  No bleeding or symptom of thrombosis.  He had a right lower tooth extracted approximately 2 weeks ago.  The area has healed.  Objective:  Vital signs in last 24 hours:  Blood pressure 139/75, pulse 75, temperature 98.2 F (36.8 C), resp. rate 18, height _0  (1.727 m), weight 195 lb 3.2 oz (88.5 kg), SpO2 100 %.    HEENT: Healed tooth extraction site at the right lower anterior gum, no thrush, 2 mm ulcer of the left buccal mucosa Resp: Lungs with decreased breath sounds at the left lower posterior chest, no respiratory distress Cardio: Regular rate and rhythm GI: No hepatomegaly, nontender Vascular: No leg edema  Portacath/PICC-without erythema  Lab Results:  Lab Results  Component Value Date   WBC 6.3 12/22/2021   HGB 14.3 12/22/2021   HCT 41.5 12/22/2021   MCV 97.4 12/22/2021   PLT 414 (H) 12/22/2021   NEUTROABS 2.7 12/22/2021    CMP  Lab Results  Component Value Date   NA 137 12/01/2021   K 4.2 12/01/2021   CL 101 12/01/2021   CO2 27 12/01/2021   GLUCOSE 117 (H) 12/01/2021   BUN 19 12/01/2021   CREATININE 0.93 12/01/2021   CALCIUM 9.9 12/01/2021   PROT 7.2 12/01/2021   ALBUMIN 4.2 12/01/2021   AST 20 12/01/2021   ALT 26 12/01/2021   ALKPHOS 74 12/01/2021   BILITOT 0.5 12/01/2021   GFRNONAA >60 12/01/2021    Lab Results  Component Value Date   CEA1 63.2 (H) 09/19/2020   CEA 28.13 (H) 12/01/2021    Medications: I have reviewed the patient's current medications.   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), status post a sigmoid  colectomy and partial bladder resection 09/19/2020 2 separate sigmoid colon tumors, 2.5 cm apart, 1/13 lymph nodes positive, tumor extends to adherent soft tissue of the bladder, MSS, normal mismatch repair protein expression tumor mutation burden 0, K-ras G12D CT abdomen/pelvis 09/19/2020-sigmoid colon mass with colonic obstruction, 6 mm right lower lobe nodule CT chest 09/22/2020-multiple bilateral lung nodules consistent with metastases Elevated preoperative CEA Cycle 1 FOLFOX 10/28/2020 Cycle 2 FOLFOX 11/11/2020 Cycle 3 FOLFOX 11/25/2020 Cycle 4 FOLFOX 12/09/2020 Cycle 5 FOLFOX 12/23/2020 CT chest 12/25/2020-unchanged lung nodules, 2 cm subpleural nodule in the left posterior chest more prominent-loculated fluid? Cycle 6 FOLFOX 02/03/2021-5-FU dose reduced and bolus eliminated secondary to diarrhea Cycle 7 FOLFOX 02/17/2021 Cycle 8 FOLFOX 03/03/2021 PET 03/17/2021 pulmonary nodules without significant change, 10 mm nodule in right middle lobe with mild FDG uptake, mildly hypermetabolic pleural nodule in the lower left hemothorax, new clustered nodularity in the apical and posterior right upper lobe-likely infectious CTs 07/23/2021-new left pleural effusion with multiple pleural nodules, increased size of several pulmonary nodules, stable left paracolic gutter nodule, morphologic features suggestive of early cirrhosis Thoracentesis 07/24/2021-1200 cc of fluid removed, rare atypical cells present Thoracentesis 627 2023-1.5 L of fluid removed, atypical cells present, acute inflammation and blood Thoracentesis 08/14/2021- 1.2 L of fluid removed, adenocarcinoma compatible with metastatic colon cancer Thoracentesis 08/21/2021-adenocarcinoma Cycle 1 FOLFIRI/bevacizumab 09/01/2021 Cycle 2 FOLFIRI/bevacizumab 09/15/2021 Cycle 3 FOLFIRI/bevacizumab 09/29/2021-Decadron prophylaxis added for  delayed nausea Cycle 4 FOLFIRI/bevacizumab 10/13/2021 Cycle 5 FOLFIRI/bevacizumab 10/27/2021 CT chest 10/28/2021-improvement in left  pleural effusion, rule/parenchymal metastases-some are slightly smaller, no new lesions Cycle 6 FOLFIRI/bevacizumab 11/10/2021 Cycle 7 FOLFIRI/bevacizumab 12/01/2021 Cycle 8 FOLFIRI/bevacizumab 12/22/2021  2.  Port-A-Cath placement-Dr. 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 appears unchanged.  He has completed 2 cycles of FOLFIRI/bevacizumab since the last CT.  The CEA was slightly higher when he was here on 12/01/2021.  We will follow-up on the CEA from today.  He will undergo a restaging chest CT prior to an office visit in 3 weeks.  He will call for new symptoms in the interim.  Betsy Coder, MD  12/22/2021  8:32 AM

## 2021-12-22 NOTE — Progress Notes (Signed)
Patient seen by Dr. Sherrill today ? ?Vitals are within treatment parameters. ? ?Labs reviewed by Dr. Sherrill and are within treatment parameters. ? ?Per physician team, patient is ready for treatment and there are NO modifications to the treatment plan.  ?

## 2021-12-22 NOTE — Patient Instructions (Addendum)
[facility]   Discharge Instructions: Thank you for choosing [facility] to provide your oncology and hematology care.   If you have a lab appointment with the [facility], please go directly to the [facility] and check in at the registration area.   Wear comfortable clothing and clothing appropriate for easy access to any Portacath or PICC line.   We strive to give you quality time with your provider. You may need to reschedule your appointment if you arrive late (15 or more minutes).  Arriving late affects you and other patients whose appointments are after yours.  Also, if you miss three or more appointments without notifying the office, you may be dismissed from the clinic at the provider's discretion.      For prescription refill requests, have your pharmacy contact our office and allow 72 hours for refills to be completed.    Today you received the following chemotherapy and/or immunotherapy agents Bevacizumab-bvzr (ZIRABEV), Irinotecan (CAMPTOSAR), Leucovorin & Flourouracil (ADRUCIL).      To help prevent nausea and vomiting after your treatment, we encourage you to take your nausea medication as directed.  BELOW ARE SYMPTOMS THAT SHOULD BE REPORTED IMMEDIATELY: *FEVER GREATER THAN 100.4 F (38 C) OR HIGHER *CHILLS OR SWEATING *NAUSEA AND VOMITING THAT IS NOT CONTROLLED WITH YOUR NAUSEA MEDICATION *UNUSUAL SHORTNESS OF BREATH *UNUSUAL BRUISING OR BLEEDING *URINARY PROBLEMS (pain or burning when urinating, or frequent urination) *BOWEL PROBLEMS (unusual diarrhea, constipation, pain near the anus) TENDERNESS IN MOUTH AND THROAT WITH OR WITHOUT PRESENCE OF ULCERS (sore throat, sores in mouth, or a toothache) UNUSUAL RASH, SWELLING OR PAIN  UNUSUAL VAGINAL DISCHARGE OR ITCHING   Items with * indicate a potential emergency and should be followed up as soon as possible or go to the Emergency Department if any problems should occur.  Please  show the CHEMOTHERAPY ALERT CARD or IMMUNOTHERAPY ALERT CARD at check-in to the Emergency Department and triage nurse.  Should you have questions after your visit or need to cancel or reschedule your appointment, please contact [facility]  Dept: [phone]  and follow the prompts.  Office hours are 8:00 a.m. to 4:30 p.m. Monday - Friday. Please note that voicemails left after 4:00 p.m. may not be returned until the following business day.  We are closed weekends and major holidays. You have access to a nurse at all times for urgent questions. Please call the main number to the clinic Dept: [phone] and follow the prompts.   For any non-urgent questions, you may also contact your provider using MyChart. We now offer e-Visits for anyone 2 and older to request care online for non-urgent symptoms. For details visit [email or web address].   Also download the MyChart app! Go to the app store, search "MyChart", open the app, select [facility], and log in with your MyChart username and password.  Masks are optional in the cancer centers. If you would like for your care team to wear a mask while they are taking care of you, please let them know. You may have one support person who is at least 79 years old accompany you for your appointments.  Bevacizumab Injection What is this medication? BEVACIZUMAB (be va SIZ yoo mab) treats some types of cancer. It works by blocking a protein that causes cancer cells to grow and multiply. This helps to slow or stop the spread of cancer cells. It is a monoclonal antibody. This medicine may be used for  other purposes; ask your health care provider or pharmacist if you have questions. COMMON BRAND NAME(S): Alymsys, Avastin, MVASI, Noah Charon What should I tell my care team before I take this medication? They need to know if you have any of these conditions: Blood clots Coughing up blood Having or recent surgery Heart failure High blood  pressure History of a connection between 2 or more body parts that do not usually connect (fistula) History of a tear in your stomach or intestines Protein in your urine An unusual or allergic reaction to bevacizumab, other medications, foods, dyes, or preservatives Pregnant or trying to get pregnant Breast-feeding How should I use this medication? This medication is injected into a vein. It is given by your care team in a hospital or clinic setting. Talk to your care team the use of this medication in children. Special care may be needed. Overdosage: If you think you have taken too much of this medicine contact a poison control center or emergency room at once. NOTE: This medicine is only for you. Do not share this medicine with others. What if I miss a dose? Keep appointments for follow-up doses. It is important not to miss your dose. Call your care team if you are unable to keep an appointment. What may interact with this medication? Interactions are not expected. This list may not describe all possible interactions. Give your health care provider a list of all the medicines, herbs, non-prescription drugs, or dietary supplements you use. Also tell them if you smoke, drink alcohol, or use illegal drugs. Some items may interact with your medicine. What should I watch for while using this medication? Your condition will be monitored carefully while you are receiving this medication. You may need blood work while taking this medication. This medication may make you feel generally unwell. This is not uncommon as chemotherapy can affect healthy cells as well as cancer cells. Report any side effects. Continue your course of treatment even though you feel ill unless your care team tells you to stop. This medication may increase your risk to bruise or bleed. Call your care team if you notice any unusual bleeding. Before having surgery, talk to your care team to make sure it is ok. This medication can  increase the risk of poor healing of your surgical site or wound. You will need to stop this medication for 28 days before surgery. After surgery, wait at least 28 days before restarting this medication. Make sure the surgical site or wound is healed enough before restarting this medication. Talk to your care team if questions. Talk to your care team if you may be pregnant. Serious birth defects can occur if you take this medication during pregnancy and for 6 months after the last dose. Contraception is recommended while taking this medication and for 6 months after the last dose. Your care team can help you find the option that works for you. Do not breastfeed while taking this medication and for 6 months after the last dose. This medication can cause infertility. Talk to your care team if you are concerned about your fertility. What side effects may I notice from receiving this medication? Side effects that you should report to your care team as soon as possible: Allergic reactions--skin rash, itching, hives, swelling of the face, lips, tongue, or throat Bleeding--bloody or black, tar-like stools, vomiting blood or brown material that looks like coffee grounds, red or dark brown urine, small red or purple spots on skin, unusual bruising or bleeding  Blood clot--pain, swelling, or warmth in the leg, shortness of breath, chest pain Heart attack--pain or tightness in the chest, shoulders, arms, or jaw, nausea, shortness of breath, cold or clammy skin, feeling faint or lightheaded Heart failure--shortness of breath, swelling of the ankles, feet, or hands, sudden weight gain, unusual weakness or fatigue Increase in blood pressure Infection--fever, chills, cough, sore throat, wounds that don't heal, pain or trouble when passing urine, general feeling of discomfort or being unwell Infusion reactions--chest pain, shortness of breath or trouble breathing, feeling faint or lightheaded Kidney injury--decrease in  the amount of urine, swelling of the ankles, hands, or feet Stomach pain that is severe, does not go away, or gets worse Stroke--sudden numbness or weakness of the face, arm, or leg, trouble speaking, confusion, trouble walking, loss of balance or coordination, dizziness, severe headache, change in vision Sudden and severe headache, confusion, change in vision, seizures, which may be signs of posterior reversible encephalopathy syndrome (PRES) Side effects that usually do not require medical attention (report to your care team if they continue or are bothersome): Back pain Change in taste Diarrhea Dry skin Increased tears Nosebleed This list may not describe all possible side effects. Call your doctor for medical advice about side effects. You may report side effects to FDA at 1-800-FDA-1088. Where should I keep my medication? This medication is given in a hospital or clinic. It will not be stored at home. NOTE: This sheet is a summary. It may not cover all possible information. If you have questions about this medicine, talk to your doctor, pharmacist, or health care provider.  2023 Elsevier/Gold Standard (2021-06-09 00:00:00)  Irinotecan Injection What is this medication? IRINOTECAN (ir in oh TEE kan) treats some types of cancer. It works by slowing down the growth of cancer cells. This medicine may be used for other purposes; ask your health care provider or pharmacist if you have questions. COMMON BRAND NAME(S): Camptosar What should I tell my care team before I take this medication? They need to know if you have any of these conditions: Dehydration Diarrhea Infection, especially a viral infection, such as chickenpox, cold sores, herpes Liver disease Low blood cell levels (white cells, red cells, and platelets) Low levels of electrolytes, such as calcium, magnesium, or potassium in your blood Recent or ongoing radiation An unusual or allergic reaction to irinotecan, other  medications, foods, dyes, or preservatives If you or your partner are pregnant or trying to get pregnant Breast-feeding How should I use this medication? This medication is injected into a vein. It is given by your care team in a hospital or clinic setting. Talk to your care team about the use of this medication in children. Special care may be needed. Overdosage: If you think you have taken too much of this medicine contact a poison control center or emergency room at once. NOTE: This medicine is only for you. Do not share this medicine with others. What if I miss a dose? Keep appointments for follow-up doses. It is important not to miss your dose. Call your care team if you are unable to keep an appointment. What may interact with this medication? Do not take this medication with any of the following: Cobicistat Itraconazole This medication may also interact with the following: Certain antibiotics, such as clarithromycin, rifampin, rifabutin Certain antivirals for HIV or AIDS Certain medications for fungal infections, such as ketoconazole, posaconazole, voriconazole Certain medications for seizures, such as carbamazepine, phenobarbital, phenytoin Gemfibrozil Nefazodone St. John's wort This list may  not describe all possible interactions. Give your health care provider a list of all the medicines, herbs, non-prescription drugs, or dietary supplements you use. Also tell them if you smoke, drink alcohol, or use illegal drugs. Some items may interact with your medicine. What should I watch for while using this medication? Your condition will be monitored carefully while you are receiving this medication. You may need blood work while taking this medication. This medication may make you feel generally unwell. This is not uncommon as chemotherapy can affect healthy cells as well as cancer cells. Report any side effects. Continue your course of treatment even though you feel ill unless your care  team tells you to stop. This medication can cause serious side effects. To reduce the risk, your care team may give you other medications to take before receiving this one. Be sure to follow the directions from your care team. This medication may affect your coordination, reaction time, or judgement. Do not drive or operate machinery until you know how this medication affects you. Sit up or stand slowly to reduce the risk of dizzy or fainting spells. Drinking alcohol with this medication can increase the risk of these side effects. This medication may increase your risk of getting an infection. Call your care team for advice if you get a fever, chills, sore throat, or other symptoms of a cold or flu. Do not treat yourself. Try to avoid being around people who are sick. Avoid taking medications that contain aspirin, acetaminophen, ibuprofen, naproxen, or ketoprofen unless instructed by your care team. These medications may hide a fever. This medication may increase your risk to bruise or bleed. Call your care team if you notice any unusual bleeding. Be careful brushing or flossing your teeth or using a toothpick because you may get an infection or bleed more easily. If you have any dental work done, tell your dentist you are receiving this medication. Talk to your care team if you or your partner are pregnant or think either of you might be pregnant. This medication can cause serious birth defects if taken during pregnancy and for 6 months after the last dose. You will need a negative pregnancy test before starting this medication. Contraception is recommended while taking this medication and for 6 months after the last dose. Your care team can help you find the option that works for you. Do not father a child while taking this medication and for 3 months after the last dose. Use a condom for contraception during this time period. Do not breastfeed while taking this medication and for 7 days after the last  dose. This medication may cause infertility. Talk to your care team if you are concerned about your fertility. What side effects may I notice from receiving this medication? Side effects that you should report to your care team as soon as possible: Allergic reactions--skin rash, itching, hives, swelling of the face, lips, tongue, or throat Dry cough, shortness of breath or trouble breathing Increased saliva or tears, increased sweating, stomach cramping, diarrhea, small pupils, unusual weakness or fatigue, slow heartbeat Infection--fever, chills, cough, sore throat, wounds that don't heal, pain or trouble when passing urine, general feeling of discomfort or being unwell Kidney injury--decrease in the amount of urine, swelling of the ankles, hands, or feet Low red blood cell level--unusual weakness or fatigue, dizziness, headache, trouble breathing Severe or prolonged diarrhea Unusual bruising or bleeding Side effects that usually do not require medical attention (report to your care team if they  continue or are bothersome): Constipation Diarrhea Hair loss Loss of appetite Nausea Stomach pain This list may not describe all possible side effects. Call your doctor for medical advice about side effects. You may report side effects to FDA at 1-800-FDA-1088. Where should I keep my medication? This medication is given in a hospital or clinic. It will not be stored at home. NOTE: This sheet is a summary. It may not cover all possible information. If you have questions about this medicine, talk to your doctor, pharmacist, or health care provider.  2023 Elsevier/Gold Standard (2021-06-04 00:00:00)  Leucovorin Injection What is this medication? LEUCOVORIN (loo koe VOR in) prevents side effects from certain medications, such as methotrexate. It works by increasing folate levels. This helps protect healthy cells in your body. It may also be used to treat anemia caused by low levels of folate. It can  also be used with fluorouracil, a type of chemotherapy, to treat colorectal cancer. It works by increasing the effects of fluorouracil in the body. This medicine may be used for other purposes; ask your health care provider or pharmacist if you have questions. What should I tell my care team before I take this medication? They need to know if you have any of these conditions: Anemia from low levels of vitamin B12 in the blood An unusual or allergic reaction to leucovorin, folic acid, other medications, foods, dyes, or preservatives Pregnant or trying to get pregnant Breastfeeding How should I use this medication? This medication is injected into a vein or a muscle. It is given by your care team in a hospital or clinic setting. Talk to your care team about the use of this medication in children. Special care may be needed. Overdosage: If you think you have taken too much of this medicine contact a poison control center or emergency room at once. NOTE: This medicine is only for you. Do not share this medicine with others. What if I miss a dose? Keep appointments for follow-up doses. It is important not to miss your dose. Call your care team if you are unable to keep an appointment. What may interact with this medication? Capecitabine Fluorouracil Phenobarbital Phenytoin Primidone Trimethoprim;sulfamethoxazole This list may not describe all possible interactions. Give your health care provider a list of all the medicines, herbs, non-prescription drugs, or dietary supplements you use. Also tell them if you smoke, drink alcohol, or use illegal drugs. Some items may interact with your medicine. What should I watch for while using this medication? Your condition will be monitored carefully while you are receiving this medication. This medication may increase the side effects of 5-fluorouracil. Tell your care team if you have diarrhea or mouth sores that do not get better or that get worse. What  side effects may I notice from receiving this medication? Side effects that you should report to your care team as soon as possible: Allergic reactions--skin rash, itching, hives, swelling of the face, lips, tongue, or throat This list may not describe all possible side effects. Call your doctor for medical advice about side effects. You may report side effects to FDA at 1-800-FDA-1088. Where should I keep my medication? This medication is given in a hospital or clinic. It will not be stored at home. NOTE: This sheet is a summary. It may not cover all possible information. If you have questions about this medicine, talk to your doctor, pharmacist, or health care provider.  2023 Elsevier/Gold Standard (2021-06-05 00:00:00)  Fluorouracil Injection What is this medication? FLUOROURACIL (  flure oh YOOR a sil) treats some types of cancer. It works by slowing down the growth of cancer cells. This medicine may be used for other purposes; ask your health care provider or pharmacist if you have questions. COMMON BRAND NAME(S): Adrucil What should I tell my care team before I take this medication? They need to know if you have any of these conditions: Blood disorders Dihydropyrimidine dehydrogenase (DPD) deficiency Infection, such as chickenpox, cold sores, herpes Kidney disease Liver disease Poor nutrition Recent or ongoing radiation therapy An unusual or allergic reaction to fluorouracil, other medications, foods, dyes, or preservatives If you or your partner are pregnant or trying to get pregnant Breast-feeding How should I use this medication? This medication is injected into a vein. It is administered by your care team in a hospital or clinic setting. Talk to your care team about the use of this medication in children. Special care may be needed. Overdosage: If you think you have taken too much of this medicine contact a poison control center or emergency room at once. NOTE: This medicine is  only for you. Do not share this medicine with others. What if I miss a dose? Keep appointments for follow-up doses. It is important not to miss your dose. Call your care team if you are unable to keep an appointment. What may interact with this medication? Do not take this medication with any of the following: Live virus vaccines This medication may also interact with the following: Medications that treat or prevent blood clots, such as warfarin, enoxaparin, dalteparin This list may not describe all possible interactions. Give your health care provider a list of all the medicines, herbs, non-prescription drugs, or dietary supplements you use. Also tell them if you smoke, drink alcohol, or use illegal drugs. Some items may interact with your medicine. What should I watch for while using this medication? Your condition will be monitored carefully while you are receiving this medication. This medication may make you feel generally unwell. This is not uncommon as chemotherapy can affect healthy cells as well as cancer cells. Report any side effects. Continue your course of treatment even though you feel ill unless your care team tells you to stop. In some cases, you may be given additional medications to help with side effects. Follow all directions for their use. This medication may increase your risk of getting an infection. Call your care team for advice if you get a fever, chills, sore throat, or other symptoms of a cold or flu. Do not treat yourself. Try to avoid being around people who are sick. This medication may increase your risk to bruise or bleed. Call your care team if you notice any unusual bleeding. Be careful brushing or flossing your teeth or using a toothpick because you may get an infection or bleed more easily. If you have any dental work done, tell your dentist you are receiving this medication. Avoid taking medications that contain aspirin, acetaminophen, ibuprofen, naproxen, or  ketoprofen unless instructed by your care team. These medications may hide a fever. Do not treat diarrhea with over the counter products. Contact your care team if you have diarrhea that lasts more than 2 days or if it is severe and watery. This medication can make you more sensitive to the sun. Keep out of the sun. If you cannot avoid being in the sun, wear protective clothing and sunscreen. Do not use sun lamps, tanning beds, or tanning booths. Talk to your care team if you or  your partner wish to become pregnant or think you might be pregnant. This medication can cause serious birth defects if taken during pregnancy and for 3 months after the last dose. A reliable form of contraception is recommended while taking this medication and for 3 months after the last dose. Talk to your care team about effective forms of contraception. Do not father a child while taking this medication and for 3 months after the last dose. Use a condom while having sex during this time period. Do not breastfeed while taking this medication. This medication may cause infertility. Talk to your care team if you are concerned about your fertility. What side effects may I notice from receiving this medication? Side effects that you should report to your care team as soon as possible: Allergic reactions--skin rash, itching, hives, swelling of the face, lips, tongue, or throat Heart attack--pain or tightness in the chest, shoulders, arms, or jaw, nausea, shortness of breath, cold or clammy skin, feeling faint or lightheaded Heart failure--shortness of breath, swelling of the ankles, feet, or hands, sudden weight gain, unusual weakness or fatigue Heart rhythm changes--fast or irregular heartbeat, dizziness, feeling faint or lightheaded, chest pain, trouble breathing High ammonia level--unusual weakness or fatigue, confusion, loss of appetite, nausea, vomiting, seizures Infection--fever, chills, cough, sore throat, wounds that don't  heal, pain or trouble when passing urine, general feeling of discomfort or being unwell Low red blood cell level--unusual weakness or fatigue, dizziness, headache, trouble breathing Pain, tingling, or numbness in the hands or feet, muscle weakness, change in vision, confusion or trouble speaking, loss of balance or coordination, trouble walking, seizures Redness, swelling, and blistering of the skin over hands and feet Severe or prolonged diarrhea Unusual bruising or bleeding Side effects that usually do not require medical attention (report to your care team if they continue or are bothersome): Dry skin Headache Increased tears Nausea Pain, redness, or swelling with sores inside the mouth or throat Sensitivity to light Vomiting This list may not describe all possible side effects. Call your doctor for medical advice about side effects. You may report side effects to FDA at 1-800-FDA-1088. Where should I keep my medication? This medication is given in a hospital or clinic. It will not be stored at home. NOTE: This sheet is a summary. It may not cover all possible information. If you have questions about this medicine, talk to your doctor, pharmacist, or health care provider.  2023 [employer]/Gold Standard (2021-06-02 00:00:00)  The chemotherapy medication bag should finish at 46 hours, 96 hours, or 7 days. For example, if your pump is scheduled for 46 hours and it was put on at 4:00 p.m., it should finish at 2:00 p.m. the day it is scheduled to come off regardless of your appointment time.     Estimated time to finish at 11:00 a.m. on Thursday 12/24/2021.   If the display on your pump reads "Low Volume" and it is beeping, take the batteries out of the pump and come to the cancer center for it to be taken off.   If the pump alarms go off prior to the pump reading "Low Volume" then call [phone] and someone can assist you.  If the plunger comes out and the chemotherapy medication is  leaking out, please use your home chemo spill kit to clean up the spill. Do NOT use paper towels or other household products.  If you have problems or questions regarding your pump, please call either [phone] (24 hours a day) or the  cancer center Monday-Friday 8:00 a.m.- 4:30 p.m. at the clinic number and we will assist you. If you are unable to get assistance, then go to the nearest Emergency Department and ask the staff to contact the IV team for assistance.

## 2021-12-22 NOTE — Patient Instructions (Signed)

## 2021-12-24 ENCOUNTER — Inpatient Hospital Stay: Payer: Medicare Other

## 2021-12-24 ENCOUNTER — Encounter: Payer: Self-pay | Admitting: Oncology

## 2021-12-24 VITALS — BP 139/73 | HR 72 | Temp 97.7°F | Resp 18

## 2021-12-24 DIAGNOSIS — Z5112 Encounter for antineoplastic immunotherapy: Secondary | ICD-10-CM | POA: Diagnosis not present

## 2021-12-24 DIAGNOSIS — Z452 Encounter for adjustment and management of vascular access device: Secondary | ICD-10-CM | POA: Diagnosis not present

## 2021-12-24 DIAGNOSIS — C187 Malignant neoplasm of sigmoid colon: Secondary | ICD-10-CM | POA: Diagnosis not present

## 2021-12-24 DIAGNOSIS — C7802 Secondary malignant neoplasm of left lung: Secondary | ICD-10-CM | POA: Diagnosis not present

## 2021-12-24 DIAGNOSIS — Z5111 Encounter for antineoplastic chemotherapy: Secondary | ICD-10-CM | POA: Diagnosis not present

## 2021-12-24 DIAGNOSIS — C7801 Secondary malignant neoplasm of right lung: Secondary | ICD-10-CM | POA: Diagnosis not present

## 2021-12-24 MED ORDER — HEPARIN SOD (PORK) LOCK FLUSH 100 UNIT/ML IV SOLN
500.0000 [IU] | Freq: Once | INTRAVENOUS | Status: AC | PRN
  Administered 2021-12-24: 500 [IU]

## 2021-12-24 MED ORDER — SODIUM CHLORIDE 0.9% FLUSH
10.0000 mL | INTRAVENOUS | Status: DC | PRN
  Administered 2021-12-24: 10 mL

## 2022-01-07 ENCOUNTER — Ambulatory Visit (HOSPITAL_BASED_OUTPATIENT_CLINIC_OR_DEPARTMENT_OTHER)
Admission: RE | Admit: 2022-01-07 | Discharge: 2022-01-07 | Disposition: A | Discharge status: Home / Self Care | Payer: Medicare Other | Source: Ambulatory Visit | Attending: Oncology | Admitting: Oncology

## 2022-01-07 DIAGNOSIS — C187 Malignant neoplasm of sigmoid colon: Secondary | ICD-10-CM | POA: Diagnosis not present

## 2022-01-07 DIAGNOSIS — C189 Malignant neoplasm of colon, unspecified: Secondary | ICD-10-CM | POA: Diagnosis not present

## 2022-01-07 DIAGNOSIS — R918 Other nonspecific abnormal finding of lung field: Secondary | ICD-10-CM | POA: Diagnosis not present

## 2022-01-10 ENCOUNTER — Other Ambulatory Visit: Payer: Self-pay | Admitting: Oncology

## 2022-01-10 DIAGNOSIS — C187 Malignant neoplasm of sigmoid colon: Secondary | ICD-10-CM

## 2022-01-12 ENCOUNTER — Inpatient Hospital Stay: Payer: Medicare Other

## 2022-01-12 ENCOUNTER — Inpatient Hospital Stay: Payer: Medicare Other | Attending: Oncology

## 2022-01-12 ENCOUNTER — Inpatient Hospital Stay (HOSPITAL_BASED_OUTPATIENT_CLINIC_OR_DEPARTMENT_OTHER): Payer: Medicare Other | Admitting: Nurse Practitioner

## 2022-01-12 ENCOUNTER — Encounter: Payer: Self-pay | Admitting: Nurse Practitioner

## 2022-01-12 VITALS — BP 108/92 | HR 65 | Temp 98.2°F | Resp 16 | Wt 200.8 lb

## 2022-01-12 DIAGNOSIS — Z95828 Presence of other vascular implants and grafts: Secondary | ICD-10-CM

## 2022-01-12 DIAGNOSIS — C7802 Secondary malignant neoplasm of left lung: Secondary | ICD-10-CM | POA: Insufficient documentation

## 2022-01-12 DIAGNOSIS — C187 Malignant neoplasm of sigmoid colon: Secondary | ICD-10-CM | POA: Insufficient documentation

## 2022-01-12 DIAGNOSIS — J90 Pleural effusion, not elsewhere classified: Secondary | ICD-10-CM | POA: Diagnosis not present

## 2022-01-12 DIAGNOSIS — C7801 Secondary malignant neoplasm of right lung: Secondary | ICD-10-CM | POA: Diagnosis not present

## 2022-01-12 LAB — CBC WITH DIFFERENTIAL (CANCER CENTER ONLY)
Abs Immature Granulocytes: 0.03 10*3/uL (ref 0.00–0.07)
Basophils Absolute: 0.1 10*3/uL (ref 0.0–0.1)
Basophils Relative: 2 %
Eosinophils Absolute: 0.4 10*3/uL (ref 0.0–0.5)
Eosinophils Relative: 6 %
HCT: 41.5 % (ref 39.0–52.0)
Hemoglobin: 14.2 g/dL (ref 13.0–17.0)
Immature Granulocytes: 1 %
Lymphocytes Relative: 35 %
Lymphs Abs: 2.2 10*3/uL (ref 0.7–4.0)
MCH: 33.5 pg (ref 26.0–34.0)
MCHC: 34.2 g/dL (ref 30.0–36.0)
MCV: 97.9 fL (ref 80.0–100.0)
Monocytes Absolute: 0.8 10*3/uL (ref 0.1–1.0)
Monocytes Relative: 12 %
Neutro Abs: 2.8 10*3/uL (ref 1.7–7.7)
Neutrophils Relative %: 44 %
Platelet Count: 323 10*3/uL (ref 150–400)
RBC: 4.24 MIL/uL (ref 4.22–5.81)
RDW: 15 % (ref 11.5–15.5)
WBC Count: 6.3 10*3/uL (ref 4.0–10.5)
nRBC: 0 % (ref 0.0–0.2)

## 2022-01-12 LAB — CMP (CANCER CENTER ONLY)
ALT: 20 U/L (ref 0–44)
AST: 21 U/L (ref 15–41)
Albumin: 4 g/dL (ref 3.5–5.0)
Alkaline Phosphatase: 69 U/L (ref 38–126)
Anion gap: 10 (ref 5–15)
BUN: 15 mg/dL (ref 8–23)
CO2: 25 mmol/L (ref 22–32)
Calcium: 8.6 mg/dL — ABNORMAL LOW (ref 8.9–10.3)
Chloride: 105 mmol/L (ref 98–111)
Creatinine: 0.69 mg/dL (ref 0.61–1.24)
GFR, Estimated: >60 mL/min (ref >60)
Glucose, Bld: 108 mg/dL — ABNORMAL HIGH (ref 70–99)
Potassium: 3.6 mmol/L (ref 3.5–5.1)
Sodium: 140 mmol/L (ref 135–145)
Total Bilirubin: 0.4 mg/dL (ref 0.3–1.2)
Total Protein: 7.1 g/dL (ref 6.5–8.1)

## 2022-01-12 LAB — CEA (ACCESS): CEA (CHCC): 21.57 ng/mL — ABNORMAL HIGH (ref 0.00–5.00)

## 2022-01-12 MED ORDER — SODIUM CHLORIDE 0.9% FLUSH
10.0000 mL | INTRAVENOUS | Status: DC | PRN
  Administered 2022-01-12: 10 mL via INTRAVENOUS

## 2022-01-12 MED ORDER — HEPARIN SOD (PORK) LOCK FLUSH 100 UNIT/ML IV SOLN
500.0000 [IU] | Freq: Once | INTRAVENOUS | Status: AC
  Administered 2022-01-12: 500 [IU] via INTRAVENOUS

## 2022-01-12 NOTE — Progress Notes (Signed)
[facility] OFFICE PROGRESS NOTE   Diagnosis: Colon cancer  INTERVAL HISTORY:   Craig Best returns as scheduled.  He completed cycle 8 FOLFIRI/bevacizumab 12/22/2021.  He feels he tolerated the chemotherapy well.  He denies shortness of breath.  Last week multiple family members developed a headache, nausea, diarrhea.  He had loose stools yesterday.  None so far today.  He feels weak and would like to hold chemotherapy.  Objective:  Vital signs in last 24 hours:  Blood pressure (!) 108/92, pulse 65, temperature 98.2 F (36.8 C), temperature source Temporal, resp. rate 16, weight 200 lb 12.8 oz (91.1 kg), SpO2 99 %.    HEENT: No thrush or ulcers.  Tooth extraction site right lower anterior gum appears healed.  Resp: Lungs clear bilaterally.  Breath sounds diminished at the left lower lung field.  No respiratory distress. Cardio: Regular rate and rhythm. GI: Abdomen soft and nontender.  No hepatomegaly.  Left lower quadrant colostomy. Vascular: No leg edema. Neuro: Alert and oriented. Skin: Palms with hyperpigmentation.  No erythema. Port-A-Cath without erythema.   Lab Results:  Lab Results  Component Value Date   WBC 6.3 01/12/2022   HGB 14.2 01/12/2022   HCT 41.5 01/12/2022   MCV 97.9 01/12/2022   PLT 323 01/12/2022   NEUTROABS 2.8 01/12/2022    Imaging:  No results found.  Medications: I have reviewed the patient's current medications.  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), status post a sigmoid colectomy and partial bladder resection 09/19/2020 2 separate sigmoid colon tumors, 2.5 cm apart, 1/13 lymph nodes positive, tumor extends to adherent soft tissue of the bladder, MSS, normal mismatch repair protein expression tumor mutation burden 0, K-ras G12D CT abdomen/pelvis 09/19/2020-sigmoid colon mass with colonic obstruction, 6 mm right lower lobe nodule CT chest 09/22/2020-multiple bilateral lung nodules consistent with  metastases Elevated preoperative CEA Cycle 1 FOLFOX 10/28/2020 Cycle 2 FOLFOX 11/11/2020 Cycle 3 FOLFOX 11/25/2020 Cycle 4 FOLFOX 12/09/2020 Cycle 5 FOLFOX 12/23/2020 CT chest 12/25/2020-unchanged lung nodules, 2 cm subpleural nodule in the left posterior chest more prominent-loculated fluid? Cycle 6 FOLFOX 02/03/2021-5-FU dose reduced and bolus eliminated secondary to diarrhea Cycle 7 FOLFOX 02/17/2021 Cycle 8 FOLFOX 03/03/2021 PET 03/17/2021 pulmonary nodules without significant change, 10 mm nodule in right middle lobe with mild FDG uptake, mildly hypermetabolic pleural nodule in the lower left hemothorax, new clustered nodularity in the apical and posterior right upper lobe-likely infectious CTs 07/23/2021-new left pleural effusion with multiple pleural nodules, increased size of several pulmonary nodules, stable left paracolic gutter nodule, morphologic features suggestive of early cirrhosis Thoracentesis 07/24/2021-1200 cc of fluid removed, rare atypical cells present Thoracentesis 627 2023-1.5 L of fluid removed, atypical cells present, acute inflammation and blood Thoracentesis 08/14/2021- 1.2 L of fluid removed, adenocarcinoma compatible with metastatic colon cancer Thoracentesis 08/21/2021-adenocarcinoma Cycle 1 FOLFIRI/bevacizumab 09/01/2021 Cycle 2 FOLFIRI/bevacizumab 09/15/2021 Cycle 3 FOLFIRI/bevacizumab 09/29/2021-Decadron prophylaxis added for delayed nausea Cycle 4 FOLFIRI/bevacizumab 10/13/2021 Cycle 5 FOLFIRI/bevacizumab 10/27/2021 CT chest 10/28/2021-improvement in left pleural effusion, rule/parenchymal metastases-some are slightly smaller, no new lesions Cycle 6 FOLFIRI/bevacizumab 11/10/2021 Cycle 7 FOLFIRI/bevacizumab 12/01/2021 Cycle 8 FOLFIRI/bevacizumab 12/22/2021 CT chest 01/07/2022-stable pulmonary parenchymal and pleural metastatic disease.  2.  Port-A-Cath placement-Dr. 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  Recent restaging chest CT shows stable disease.  CEA remains stable.  Recommendation is continue the current treatment.  [name] would like to take a treatment break.  He and his Best understand the recommendation to continue treatment as he is currently doing.  He agrees to resume treatment in early January 2024.  CBC and chemistry panel reviewed.  He will return for lab, follow-up, FOLFIRI/bevacizumab 02/09/2022.  We are available to see him sooner if needed.  Patient seen with Dr. Benay Spice.  CT report/images reviewed with Craig Best at today's visit.   Ned Card ANP/GNP-BC   01/12/2022  8:17 AM  This was a shared visit with Ned Card.  We reviewed the CT findings and images with Craig Best.  The CT is consistent with stable disease.  The left pleural fluid has not reaccumulated.  I recommend continuing FOLFIRI/bevacizumab.  Craig Best requests a treatment break.  The plan is to continue FOLFIRI/bevacizumab in early January.  I was present for greater than 50% today's visit.  I performed medical decision making.  Craig Manson, MD

## 2022-01-13 ENCOUNTER — Other Ambulatory Visit: Payer: Self-pay

## 2022-01-14 ENCOUNTER — Inpatient Hospital Stay: Payer: Medicare Other

## 2022-02-09 ENCOUNTER — Inpatient Hospital Stay: Payer: Medicare Other | Admitting: Licensed Clinical Social Worker

## 2022-02-09 ENCOUNTER — Encounter: Payer: Self-pay | Admitting: *Deleted

## 2022-02-09 ENCOUNTER — Inpatient Hospital Stay: Payer: Medicare Other

## 2022-02-09 ENCOUNTER — Inpatient Hospital Stay: Payer: Medicare Other | Admitting: Oncology

## 2022-02-09 ENCOUNTER — Inpatient Hospital Stay: Payer: Medicare Other | Attending: Oncology

## 2022-02-09 VITALS — BP 151/78 | HR 71 | Temp 98.1°F | Resp 18 | Ht 68.0 in | Wt 200.1 lb

## 2022-02-09 VITALS — BP 146/69 | HR 60

## 2022-02-09 DIAGNOSIS — Z452 Encounter for adjustment and management of vascular access device: Secondary | ICD-10-CM | POA: Diagnosis not present

## 2022-02-09 DIAGNOSIS — C7801 Secondary malignant neoplasm of right lung: Secondary | ICD-10-CM | POA: Diagnosis not present

## 2022-02-09 DIAGNOSIS — C187 Malignant neoplasm of sigmoid colon: Secondary | ICD-10-CM | POA: Insufficient documentation

## 2022-02-09 DIAGNOSIS — C7802 Secondary malignant neoplasm of left lung: Secondary | ICD-10-CM | POA: Insufficient documentation

## 2022-02-09 DIAGNOSIS — Z5111 Encounter for antineoplastic chemotherapy: Secondary | ICD-10-CM | POA: Insufficient documentation

## 2022-02-09 LAB — CBC WITH DIFFERENTIAL (CANCER CENTER ONLY)
Abs Immature Granulocytes: 0.02 10*3/uL (ref 0.00–0.07)
Basophils Absolute: 0.1 10*3/uL (ref 0.0–0.1)
Basophils Relative: 2 %
Eosinophils Absolute: 0.5 10*3/uL (ref 0.0–0.5)
Eosinophils Relative: 6 %
HCT: 44.8 % (ref 39.0–52.0)
Hemoglobin: 15 g/dL (ref 13.0–17.0)
Immature Granulocytes: 0 %
Lymphocytes Relative: 23 %
Lymphs Abs: 2 10*3/uL (ref 0.7–4.0)
MCH: 32.5 pg (ref 26.0–34.0)
MCHC: 33.5 g/dL (ref 30.0–36.0)
MCV: 97 fL (ref 80.0–100.0)
Monocytes Absolute: 0.8 10*3/uL (ref 0.1–1.0)
Monocytes Relative: 9 %
Neutro Abs: 5.2 10*3/uL (ref 1.7–7.7)
Neutrophils Relative %: 60 %
Platelet Count: 284 10*3/uL (ref 150–400)
RBC: 4.62 MIL/uL (ref 4.22–5.81)
RDW: 14.1 % (ref 11.5–15.5)
WBC Count: 8.7 10*3/uL (ref 4.0–10.5)
nRBC: 0 % (ref 0.0–0.2)

## 2022-02-09 LAB — CMP (CANCER CENTER ONLY)
ALT: 12 U/L (ref 0–44)
AST: 17 U/L (ref 15–41)
Albumin: 4.1 g/dL (ref 3.5–5.0)
Alkaline Phosphatase: 58 U/L (ref 38–126)
Anion gap: 8 (ref 5–15)
BUN: 16 mg/dL (ref 8–23)
CO2: 28 mmol/L (ref 22–32)
Calcium: 9.2 mg/dL (ref 8.9–10.3)
Chloride: 103 mmol/L (ref 98–111)
Creatinine: 0.73 mg/dL (ref 0.61–1.24)
GFR, Estimated: >60 mL/min (ref >60)
Glucose, Bld: 109 mg/dL — ABNORMAL HIGH (ref 70–99)
Potassium: 3.6 mmol/L (ref 3.5–5.1)
Sodium: 139 mmol/L (ref 135–145)
Total Bilirubin: 0.6 mg/dL (ref 0.3–1.2)
Total Protein: 7.4 g/dL (ref 6.5–8.1)

## 2022-02-09 LAB — TOTAL PROTEIN, URINE DIPSTICK: Protein, ur: NEGATIVE mg/dL

## 2022-02-09 LAB — CEA (ACCESS): CEA (CHCC): 23.88 ng/mL — ABNORMAL HIGH (ref 0.00–5.00)

## 2022-02-09 MED ORDER — SODIUM CHLORIDE 0.9 % IV SOLN
140.0000 mg/m2 | Freq: Once | INTRAVENOUS | Status: AC
  Administered 2022-02-09: 280 mg via INTRAVENOUS
  Filled 2022-02-09: qty 4

## 2022-02-09 MED ORDER — SODIUM CHLORIDE 0.9 % IV SOLN
7.5000 mg/kg | Freq: Once | INTRAVENOUS | Status: AC
  Administered 2022-02-09: 700 mg via INTRAVENOUS
  Filled 2022-02-09: qty 16

## 2022-02-09 MED ORDER — SODIUM CHLORIDE 0.9 % IV SOLN
2000.0000 mg/m2 | INTRAVENOUS | Status: DC
  Administered 2022-02-09: 4100 mg via INTRAVENOUS
  Filled 2022-02-09: qty 82

## 2022-02-09 MED ORDER — SODIUM CHLORIDE 0.9 % IV SOLN
10.0000 mg | Freq: Once | INTRAVENOUS | Status: AC
  Administered 2022-02-09: 10 mg via INTRAVENOUS
  Filled 2022-02-09: qty 10

## 2022-02-09 MED ORDER — SODIUM CHLORIDE 0.9 % IV SOLN
300.0000 mg/m2 | Freq: Once | INTRAVENOUS | Status: AC
  Administered 2022-02-09: 612 mg via INTRAVENOUS
  Filled 2022-02-09: qty 30.6

## 2022-02-09 MED ORDER — SODIUM CHLORIDE 0.9 % IV SOLN: Freq: Once | INTRAVENOUS | Status: AC

## 2022-02-09 MED ORDER — ATROPINE SULFATE 1 MG/ML IV SOLN
0.5000 mg | Freq: Once | INTRAVENOUS | Status: AC | PRN
  Administered 2022-02-09: 0.5 mg via INTRAVENOUS
  Filled 2022-02-09: qty 1

## 2022-02-09 MED ORDER — PALONOSETRON HCL INJECTION 0.25 MG/5ML
0.2500 mg | Freq: Once | INTRAVENOUS | Status: AC
  Administered 2022-02-09: 0.25 mg via INTRAVENOUS
  Filled 2022-02-09: qty 5

## 2022-02-09 MED ORDER — FLUOROURACIL CHEMO INJECTION 2.5 GM/50ML
300.0000 mg/m2 | Freq: Once | INTRAVENOUS | Status: AC
  Administered 2022-02-09: 600 mg via INTRAVENOUS
  Filled 2022-02-09: qty 12

## 2022-02-09 NOTE — Patient Instructions (Addendum)
Craig Best   Discharge Instructions: Thank you for choosing [facility] to provide your oncology and hematology care.   If you have a lab appointment with the [facility], please go directly to the [facility] and check in at the registration area.   Wear comfortable clothing and clothing appropriate for easy access to any Portacath or PICC line.   We strive to give you quality time with your provider. You may need to reschedule your appointment if you arrive late (15 or more minutes).  Arriving late affects you and other patients whose appointments are after yours.  Also, if you miss three or more appointments without notifying the office, you may be dismissed from the clinic at the provider's discretion.      For prescription refill requests, have your pharmacy contact our office and allow 72 hours for refills to be completed.    Today you received the following chemotherapy and/or immunotherapy agents Bevacizumab-bvzr (ZIRABEV), Irinotecan (CAMPTOSAR), Leucovorin & Flourouracil (ADRUCIL).      To help prevent nausea and vomiting after your treatment, we encourage you to take your nausea medication as directed.  BELOW ARE SYMPTOMS THAT SHOULD BE REPORTED IMMEDIATELY: *FEVER GREATER THAN 100.4 F (38 C) OR HIGHER *CHILLS OR SWEATING *NAUSEA AND VOMITING THAT IS NOT CONTROLLED WITH YOUR NAUSEA MEDICATION *UNUSUAL SHORTNESS OF BREATH *UNUSUAL BRUISING OR BLEEDING *URINARY PROBLEMS (pain or burning when urinating, or frequent urination) *BOWEL PROBLEMS (unusual diarrhea, constipation, pain near the anus) TENDERNESS IN MOUTH AND THROAT WITH OR WITHOUT PRESENCE OF ULCERS (sore throat, sores in mouth, or a toothache) UNUSUAL RASH, SWELLING OR PAIN  UNUSUAL VAGINAL DISCHARGE OR ITCHING   Items with * indicate a potential emergency and should be followed up as soon as possible or go to the Emergency Department if any problems should occur.  Please  show the CHEMOTHERAPY ALERT CARD or IMMUNOTHERAPY ALERT CARD at check-in to the Emergency Department and triage nurse.  Should you have questions after your visit or need to cancel or reschedule your appointment, please contact [facility]  Dept: [phone]  and follow the prompts.  Office hours are 8:00 a.m. to 4:30 p.m. Monday - Friday. Please note that voicemails left after 4:00 p.m. may not be returned until the following business day.  We are closed weekends and major holidays. You have access to a nurse at all times for urgent questions. Please call the main number to the clinic Dept: [phone] and follow the prompts.   For any non-urgent questions, you may also contact your provider using MyChart. We now offer e-Visits for anyone 2 and older to request care online for non-urgent symptoms. For details visit [email or web address].   Also download the MyChart app! Go to the app store, search "MyChart", open the app, select [facility], and log in with your MyChart username and password.  Masks are optional in the cancer centers. If you would like for your care team to wear a mask while they are taking care of you, please let them know. You may have one support person who is at least 80 years old accompany you for your appointments.  Bevacizumab Injection What is this medication? BEVACIZUMAB (be va SIZ yoo mab) treats some types of cancer. It works by blocking a protein that causes cancer cells to grow and multiply. This helps to slow or stop the spread of cancer cells. It is a monoclonal antibody. This medicine may be used for  other purposes; ask your health care provider or pharmacist if you have questions. COMMON BRAND NAME(S): Alymsys, Avastin, MVASI, Noah Charon What should I tell my care team before I take this medication? They need to know if you have any of these conditions: Blood clots Coughing up blood Having or recent surgery Heart failure High blood  pressure History of a connection between 2 or more body parts that do not usually connect (fistula) History of a tear in your stomach or intestines Protein in your urine An unusual or allergic reaction to bevacizumab, other medications, foods, dyes, or preservatives Pregnant or trying to get pregnant Breast-feeding How should I use this medication? This medication is injected into a vein. It is given by your care team in a hospital or clinic setting. Talk to your care team the use of this medication in children. Special care may be needed. Overdosage: If you think you have taken too much of this medicine contact a poison control center or emergency room at once. NOTE: This medicine is only for you. Do not share this medicine with others. What if I miss a dose? Keep appointments for follow-up doses. It is important not to miss your dose. Call your care team if you are unable to keep an appointment. What may interact with this medication? Interactions are not expected. This list may not describe all possible interactions. Give your health care provider a list of all the medicines, herbs, non-prescription drugs, or dietary supplements you use. Also tell them if you smoke, drink alcohol, or use illegal drugs. Some items may interact with your medicine. What should I watch for while using this medication? Your condition will be monitored carefully while you are receiving this medication. You may need blood work while taking this medication. This medication may make you feel generally unwell. This is not uncommon as chemotherapy can affect healthy cells as well as cancer cells. Report any side effects. Continue your course of treatment even though you feel ill unless your care team tells you to stop. This medication may increase your risk to bruise or bleed. Call your care team if you notice any unusual bleeding. Before having surgery, talk to your care team to make sure it is ok. This medication can  increase the risk of poor healing of your surgical site or wound. You will need to stop this medication for 28 days before surgery. After surgery, wait at least 28 days before restarting this medication. Make sure the surgical site or wound is healed enough before restarting this medication. Talk to your care team if questions. Talk to your care team if you may be pregnant. Serious birth defects can occur if you take this medication during pregnancy and for 6 months after the last dose. Contraception is recommended while taking this medication and for 6 months after the last dose. Your care team can help you find the option that works for you. Do not breastfeed while taking this medication and for 6 months after the last dose. This medication can cause infertility. Talk to your care team if you are concerned about your fertility. What side effects may I notice from receiving this medication? Side effects that you should report to your care team as soon as possible: Allergic reactions--skin rash, itching, hives, swelling of the face, lips, tongue, or throat Bleeding--bloody or black, tar-like stools, vomiting blood or brown material that looks like coffee grounds, red or dark brown urine, small red or purple spots on skin, unusual bruising or bleeding  Blood clot--pain, swelling, or warmth in the leg, shortness of breath, chest pain Heart attack--pain or tightness in the chest, shoulders, arms, or jaw, nausea, shortness of breath, cold or clammy skin, feeling faint or lightheaded Heart failure--shortness of breath, swelling of the ankles, feet, or hands, sudden weight gain, unusual weakness or fatigue Increase in blood pressure Infection--fever, chills, cough, sore throat, wounds that don't heal, pain or trouble when passing urine, general feeling of discomfort or being unwell Infusion reactions--chest pain, shortness of breath or trouble breathing, feeling faint or lightheaded Kidney injury--decrease in  the amount of urine, swelling of the ankles, hands, or feet Stomach pain that is severe, does not go away, or gets worse Stroke--sudden numbness or weakness of the face, arm, or leg, trouble speaking, confusion, trouble walking, loss of balance or coordination, dizziness, severe headache, change in vision Sudden and severe headache, confusion, change in vision, seizures, which may be signs of posterior reversible encephalopathy syndrome (PRES) Side effects that usually do not require medical attention (report to your care team if they continue or are bothersome): Back pain Change in taste Diarrhea Dry skin Increased tears Nosebleed This list may not describe all possible side effects. Call your doctor for medical advice about side effects. You may report side effects to FDA at 1-800-FDA-1088. Where should I keep my medication? This medication is given in a hospital or clinic. It will not be stored at home. NOTE: This sheet is a summary. It may not cover all possible information. If you have questions about this medicine, talk to your doctor, pharmacist, or health care provider.  2023 Elsevier/Gold Standard (2021-06-09 00:00:00)  Irinotecan Injection What is this medication? IRINOTECAN (ir in oh TEE kan) treats some types of cancer. It works by slowing down the growth of cancer cells. This medicine may be used for other purposes; ask your health care provider or pharmacist if you have questions. COMMON BRAND NAME(S): Camptosar What should I tell my care team before I take this medication? They need to know if you have any of these conditions: Dehydration Diarrhea Infection, especially a viral infection, such as chickenpox, cold sores, herpes Liver disease Low blood cell levels (white cells, red cells, and platelets) Low levels of electrolytes, such as calcium, magnesium, or potassium in your blood Recent or ongoing radiation An unusual or allergic reaction to irinotecan, other  medications, foods, dyes, or preservatives If you or your partner are pregnant or trying to get pregnant Breast-feeding How should I use this medication? This medication is injected into a vein. It is given by your care team in a hospital or clinic setting. Talk to your care team about the use of this medication in children. Special care may be needed. Overdosage: If you think you have taken too much of this medicine contact a poison control center or emergency room at once. NOTE: This medicine is only for you. Do not share this medicine with others. What if I miss a dose? Keep appointments for follow-up doses. It is important not to miss your dose. Call your care team if you are unable to keep an appointment. What may interact with this medication? Do not take this medication with any of the following: Cobicistat Itraconazole This medication may also interact with the following: Certain antibiotics, such as clarithromycin, rifampin, rifabutin Certain antivirals for HIV or AIDS Certain medications for fungal infections, such as ketoconazole, posaconazole, voriconazole Certain medications for seizures, such as carbamazepine, phenobarbital, phenytoin Gemfibrozil Nefazodone St. John's wort This list may  not describe all possible interactions. Give your health care provider a list of all the medicines, herbs, non-prescription drugs, or dietary supplements you use. Also tell them if you smoke, drink alcohol, or use illegal drugs. Some items may interact with your medicine. What should I watch for while using this medication? Your condition will be monitored carefully while you are receiving this medication. You may need blood work while taking this medication. This medication may make you feel generally unwell. This is not uncommon as chemotherapy can affect healthy cells as well as cancer cells. Report any side effects. Continue your course of treatment even though you feel ill unless your care  team tells you to stop. This medication can cause serious side effects. To reduce the risk, your care team may give you other medications to take before receiving this one. Be sure to follow the directions from your care team. This medication may affect your coordination, reaction time, or judgement. Do not drive or operate machinery until you know how this medication affects you. Sit up or stand slowly to reduce the risk of dizzy or fainting spells. Drinking alcohol with this medication can increase the risk of these side effects. This medication may increase your risk of getting an infection. Call your care team for advice if you get a fever, chills, sore throat, or other symptoms of a cold or flu. Do not treat yourself. Try to avoid being around people who are sick. Avoid taking medications that contain aspirin, acetaminophen, ibuprofen, naproxen, or ketoprofen unless instructed by your care team. These medications may hide a fever. This medication may increase your risk to bruise or bleed. Call your care team if you notice any unusual bleeding. Be careful brushing or flossing your teeth or using a toothpick because you may get an infection or bleed more easily. If you have any dental work done, tell your dentist you are receiving this medication. Talk to your care team if you or your partner are pregnant or think either of you might be pregnant. This medication can cause serious birth defects if taken during pregnancy and for 6 months after the last dose. You will need a negative pregnancy test before starting this medication. Contraception is recommended while taking this medication and for 6 months after the last dose. Your care team can help you find the option that works for you. Do not father a child while taking this medication and for 3 months after the last dose. Use a condom for contraception during this time period. Do not breastfeed while taking this medication and for 7 days after the last  dose. This medication may cause infertility. Talk to your care team if you are concerned about your fertility. What side effects may I notice from receiving this medication? Side effects that you should report to your care team as soon as possible: Allergic reactions--skin rash, itching, hives, swelling of the face, lips, tongue, or throat Dry cough, shortness of breath or trouble breathing Increased saliva or tears, increased sweating, stomach cramping, diarrhea, small pupils, unusual weakness or fatigue, slow heartbeat Infection--fever, chills, cough, sore throat, wounds that don't heal, pain or trouble when passing urine, general feeling of discomfort or being unwell Kidney injury--decrease in the amount of urine, swelling of the ankles, hands, or feet Low red blood cell level--unusual weakness or fatigue, dizziness, headache, trouble breathing Severe or prolonged diarrhea Unusual bruising or bleeding Side effects that usually do not require medical attention (report to your care team if they  continue or are bothersome): Constipation Diarrhea Hair loss Loss of appetite Nausea Stomach pain This list may not describe all possible side effects. Call your doctor for medical advice about side effects. You may report side effects to FDA at 1-800-FDA-1088. Where should I keep my medication? This medication is given in a hospital or clinic. It will not be stored at home. NOTE: This sheet is a summary. It may not cover all possible information. If you have questions about this medicine, talk to your doctor, pharmacist, or health care provider.  2023 Elsevier/Gold Standard (2021-06-04 00:00:00)  Leucovorin Injection What is this medication? LEUCOVORIN (loo koe VOR in) prevents side effects from certain medications, such as methotrexate. It works by increasing folate levels. This helps protect healthy cells in your body. It may also be used to treat anemia caused by low levels of folate. It can  also be used with fluorouracil, a type of chemotherapy, to treat colorectal cancer. It works by increasing the effects of fluorouracil in the body. This medicine may be used for other purposes; ask your health care provider or pharmacist if you have questions. What should I tell my care team before I take this medication? They need to know if you have any of these conditions: Anemia from low levels of vitamin B12 in the blood An unusual or allergic reaction to leucovorin, folic acid, other medications, foods, dyes, or preservatives Pregnant or trying to get pregnant Breastfeeding How should I use this medication? This medication is injected into a vein or a muscle. It is given by your care team in a hospital or clinic setting. Talk to your care team about the use of this medication in children. Special care may be needed. Overdosage: If you think you have taken too much of this medicine contact a poison control center or emergency room at once. NOTE: This medicine is only for you. Do not share this medicine with others. What if I miss a dose? Keep appointments for follow-up doses. It is important not to miss your dose. Call your care team if you are unable to keep an appointment. What may interact with this medication? Capecitabine Fluorouracil Phenobarbital Phenytoin Primidone Trimethoprim;sulfamethoxazole This list may not describe all possible interactions. Give your health care provider a list of all the medicines, herbs, non-prescription drugs, or dietary supplements you use. Also tell them if you smoke, drink alcohol, or use illegal drugs. Some items may interact with your medicine. What should I watch for while using this medication? Your condition will be monitored carefully while you are receiving this medication. This medication may increase the side effects of 5-fluorouracil. Tell your care team if you have diarrhea or mouth sores that do not get better or that get worse. What  side effects may I notice from receiving this medication? Side effects that you should report to your care team as soon as possible: Allergic reactions--skin rash, itching, hives, swelling of the face, lips, tongue, or throat This list may not describe all possible side effects. Call your doctor for medical advice about side effects. You may report side effects to FDA at 1-800-FDA-1088. Where should I keep my medication? This medication is given in a hospital or clinic. It will not be stored at home. NOTE: This sheet is a summary. It may not cover all possible information. If you have questions about this medicine, talk to your doctor, pharmacist, or health care provider.  2023 Elsevier/Gold Standard (2021-06-05 00:00:00)  Fluorouracil Injection What is this medication? FLUOROURACIL (  flure oh YOOR a sil) treats some types of cancer. It works by slowing down the growth of cancer cells. This medicine may be used for other purposes; ask your health care provider or pharmacist if you have questions. COMMON BRAND NAME(S): Adrucil What should I tell my care team before I take this medication? They need to know if you have any of these conditions: Blood disorders Dihydropyrimidine dehydrogenase (DPD) deficiency Infection, such as chickenpox, cold sores, herpes Kidney disease Liver disease Poor nutrition Recent or ongoing radiation therapy An unusual or allergic reaction to fluorouracil, other medications, foods, dyes, or preservatives If you or your partner are pregnant or trying to get pregnant Breast-feeding How should I use this medication? This medication is injected into a vein. It is administered by your care team in a hospital or clinic setting. Talk to your care team about the use of this medication in children. Special care may be needed. Overdosage: If you think you have taken too much of this medicine contact a poison control center or emergency room at once. NOTE: This medicine is  only for you. Do not share this medicine with others. What if I miss a dose? Keep appointments for follow-up doses. It is important not to miss your dose. Call your care team if you are unable to keep an appointment. What may interact with this medication? Do not take this medication with any of the following: Live virus vaccines This medication may also interact with the following: Medications that treat or prevent blood clots, such as warfarin, enoxaparin, dalteparin This list may not describe all possible interactions. Give your health care provider a list of all the medicines, herbs, non-prescription drugs, or dietary supplements you use. Also tell them if you smoke, drink alcohol, or use illegal drugs. Some items may interact with your medicine. What should I watch for while using this medication? Your condition will be monitored carefully while you are receiving this medication. This medication may make you feel generally unwell. This is not uncommon as chemotherapy can affect healthy cells as well as cancer cells. Report any side effects. Continue your course of treatment even though you feel ill unless your care team tells you to stop. In some cases, you may be given additional medications to help with side effects. Follow all directions for their use. This medication may increase your risk of getting an infection. Call your care team for advice if you get a fever, chills, sore throat, or other symptoms of a cold or flu. Do not treat yourself. Try to avoid being around people who are sick. This medication may increase your risk to bruise or bleed. Call your care team if you notice any unusual bleeding. Be careful brushing or flossing your teeth or using a toothpick because you may get an infection or bleed more easily. If you have any dental work done, tell your dentist you are receiving this medication. Avoid taking medications that contain aspirin, acetaminophen, ibuprofen, naproxen, or  ketoprofen unless instructed by your care team. These medications may hide a fever. Do not treat diarrhea with over the counter products. Contact your care team if you have diarrhea that lasts more than 2 days or if it is severe and watery. This medication can make you more sensitive to the sun. Keep out of the sun. If you cannot avoid being in the sun, wear protective clothing and sunscreen. Do not use sun lamps, tanning beds, or tanning booths. Talk to your care team if you or  your partner wish to become pregnant or think you might be pregnant. This medication can cause serious birth defects if taken during pregnancy and for 3 months after the last dose. A reliable form of contraception is recommended while taking this medication and for 3 months after the last dose. Talk to your care team about effective forms of contraception. Do not father a child while taking this medication and for 3 months after the last dose. Use a condom while having sex during this time period. Do not breastfeed while taking this medication. This medication may cause infertility. Talk to your care team if you are concerned about your fertility. What side effects may I notice from receiving this medication? Side effects that you should report to your care team as soon as possible: Allergic reactions--skin rash, itching, hives, swelling of the face, lips, tongue, or throat Heart attack--pain or tightness in the chest, shoulders, arms, or jaw, nausea, shortness of breath, cold or clammy skin, feeling faint or lightheaded Heart failure--shortness of breath, swelling of the ankles, feet, or hands, sudden weight gain, unusual weakness or fatigue Heart rhythm changes--fast or irregular heartbeat, dizziness, feeling faint or lightheaded, chest pain, trouble breathing High ammonia level--unusual weakness or fatigue, confusion, loss of appetite, nausea, vomiting, seizures Infection--fever, chills, cough, sore throat, wounds that don't  heal, pain or trouble when passing urine, general feeling of discomfort or being unwell Low red blood cell level--unusual weakness or fatigue, dizziness, headache, trouble breathing Pain, tingling, or numbness in the hands or feet, muscle weakness, change in vision, confusion or trouble speaking, loss of balance or coordination, trouble walking, seizures Redness, swelling, and blistering of the skin over hands and feet Severe or prolonged diarrhea Unusual bruising or bleeding Side effects that usually do not require medical attention (report to your care team if they continue or are bothersome): Dry skin Headache Increased tears Nausea Pain, redness, or swelling with sores inside the mouth or throat Sensitivity to light Vomiting This list may not describe all possible side effects. Call your doctor for medical advice about side effects. You may report side effects to FDA at 1-800-FDA-1088. Where should I keep my medication? This medication is given in a hospital or clinic. It will not be stored at home. NOTE: This sheet is a summary. It may not cover all possible information. If you have questions about this medicine, talk to your doctor, pharmacist, or health care provider.  2023 Elsevier/Gold Standard (2021-06-02 00:00:00)  The chemotherapy medication bag should finish at 46 hours, 96 hours, or 7 days. For example, if your pump is scheduled for 46 hours and it was put on at 4:00 p.m., it should finish at 2:00 p.m. the day it is scheduled to come off regardless of your appointment time.     Estimated time to finish at 10:30 a.m. on Thursday 02/11/22.   If the display on your pump reads "Low Volume" and it is beeping, take the batteries out of the pump and come to the cancer center for it to be taken off.   If the pump alarms go off prior to the pump reading "Low Volume" then call [phone] and someone can assist you.  If the plunger comes out and the chemotherapy medication is  leaking out, please use your home chemo spill kit to clean up the spill. Do NOT use paper towels or other household products.  If you have problems or questions regarding your pump, please call either [phone] (24 hours a day) or the  cancer center Monday-Friday 8:00 a.m.- 4:30 p.m. at the clinic number and we will assist you. If you are unable to get assistance, then go to the nearest Emergency Department and ask the staff to contact the IV team for assistance.

## 2022-02-09 NOTE — Progress Notes (Signed)
Patient seen by Dr. Benay Spice today  Vitals are within treatment parameters.No intervention needed for BP 151/78  Labs reviewed by Dr. Benay Spice and are within treatment parameters.  Per physician team, patient is ready for treatment. Please note that modifications are being made to the treatment plan including Dose reduction in irinotecan.

## 2022-02-09 NOTE — Progress Notes (Signed)
[place] OFFICE PROGRESS NOTE   Diagnosis: Colon cancer  INTERVAL HISTORY:   Craig Best was last treated with FOLFIRI/bevacizumab 12/22/2021.  He requested a treatment break for the holidays.  He reports feeling much better while off of chemotherapy.  He is working full-time.  No dyspnea.  He is sleeping better.  No new complaint.  No bleeding or symptom of thrombosis.  Objective:  Vital signs in last 24 hours:  Blood pressure (!) 151/78, pulse 71, temperature 98.1 F (36.7 C), temperature source Oral, resp. rate 18, height _0  (1.727 m), weight 200 lb 1.6 oz (90.8 kg), SpO2 99 %.    HEENT: No thrush or ulcers Resp: Lungs clear bilaterally with good air movement in the left chest, no respiratory distress Cardio: Regular rate and rhythm GI: No hepatosplenomegaly, no mass, nontender left lower quadrant colostomy with brown stool Vascular: No leg edema   Portacath/PICC-without erythema  Lab Results:  Lab Results  Component Value Date   WBC 8.7 02/09/2022   HGB 15.0 02/09/2022   HCT 44.8 02/09/2022   MCV 97.0 02/09/2022   PLT 284 02/09/2022   NEUTROABS 5.2 02/09/2022    CMP  Lab Results  Component Value Date   NA 139 02/09/2022   K 3.6 02/09/2022   CL 103 02/09/2022   CO2 28 02/09/2022   GLUCOSE 109 (H) 02/09/2022   BUN 16 02/09/2022   CREATININE 0.73 02/09/2022   CALCIUM 9.2 02/09/2022   PROT 7.4 02/09/2022   ALBUMIN 4.1 02/09/2022   AST 17 02/09/2022   ALT 12 02/09/2022   ALKPHOS 58 02/09/2022   BILITOT 0.6 02/09/2022   GFRNONAA >60 02/09/2022    Lab Results  Component Value Date   CEA1 63.2 (H) 09/19/2020   CEA 23.88 (H) 02/09/2022     Medications: I have reviewed the patient's current medications.   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), status post a sigmoid colectomy and partial bladder resection 09/19/2020 2 separate sigmoid colon tumors, 2.5 cm apart, 1/13 lymph nodes positive, tumor extends to  adherent soft tissue of the bladder, MSS, normal mismatch repair protein expression tumor mutation burden 0, K-ras G12D CT abdomen/pelvis 09/19/2020-sigmoid colon mass with colonic obstruction, 6 mm right lower lobe nodule CT chest 09/22/2020-multiple bilateral lung nodules consistent with metastases Elevated preoperative CEA Cycle 1 FOLFOX 10/28/2020 Cycle 2 FOLFOX 11/11/2020 Cycle 3 FOLFOX 11/25/2020 Cycle 4 FOLFOX 12/09/2020 Cycle 5 FOLFOX 12/23/2020 CT chest 12/25/2020-unchanged lung nodules, 2 cm subpleural nodule in the left posterior chest more prominent-loculated fluid? Cycle 6 FOLFOX 02/03/2021-5-FU dose reduced and bolus eliminated secondary to diarrhea Cycle 7 FOLFOX 02/17/2021 Cycle 8 FOLFOX 03/03/2021 PET 03/17/2021 pulmonary nodules without significant change, 10 mm nodule in right middle lobe with mild FDG uptake, mildly hypermetabolic pleural nodule in the lower left hemothorax, new clustered nodularity in the apical and posterior right upper lobe-likely infectious CTs 07/23/2021-new left pleural effusion with multiple pleural nodules, increased size of several pulmonary nodules, stable left paracolic gutter nodule, morphologic features suggestive of early cirrhosis Thoracentesis 07/24/2021-1200 cc of fluid removed, rare atypical cells present Thoracentesis 627 2023-1.5 L of fluid removed, atypical cells present, acute inflammation and blood Thoracentesis 08/14/2021- 1.2 L of fluid removed, adenocarcinoma compatible with metastatic colon cancer Thoracentesis 08/21/2021-adenocarcinoma Cycle 1 FOLFIRI/bevacizumab 09/01/2021 Cycle 2 FOLFIRI/bevacizumab 09/15/2021 Cycle 3 FOLFIRI/bevacizumab 09/29/2021-Decadron prophylaxis added for delayed nausea Cycle 4 FOLFIRI/bevacizumab 10/13/2021 Cycle 5 FOLFIRI/bevacizumab 10/27/2021 CT chest 10/28/2021-improvement in left pleural effusion, rule/parenchymal metastases-some are slightly smaller, no new lesions Cycle  6 FOLFIRI/bevacizumab 11/10/2021 Cycle 7  FOLFIRI/bevacizumab 12/01/2021 Cycle 8 FOLFIRI/bevacizumab 12/22/2021 CT chest 01/07/2022-stable pulmonary parenchymal and pleural metastatic disease. Cycle 9 FOLFIRI/bevacizumab 02/09/2022  2.  Port-A-Cath placement-Dr. 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  His clinical status has improved significantly while off of chemotherapy for the past month.  He will resume FOLFIRI/bevacizumab today.  I will dose reduce the irinotecan since his quality of life has been poor while on chemotherapy.  We will consider discontinuing the current chemotherapy regimen if his performance status declines after this cycle.  We will discuss maintenance therapy with Xeloda/bevacizumab at the next office visit.  He will complete another cycle of FOLFIRI/bevacizumab today.  He will return for an office visit and chemotherapy in 3 weeks.  Betsy Coder, MD  02/09/2022  8:59 AM

## 2022-02-09 NOTE — Progress Notes (Signed)
[place] [employer]  Holiday representative met with patient to offer support and assess for needs while he was in infusion.  He had taken a break from chemotherapy and stated he felt much better during that time.  Patient expressed having a strong faith and a good life.  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    CSW provided him with contact information and the [employer].  Inquired about Advance Directives and patient said he has all of his documents completed.     Margaree Mackintosh, LCSW  Clinical Social Worker [facility]

## 2022-02-09 NOTE — Patient Instructions (Signed)

## 2022-02-11 ENCOUNTER — Inpatient Hospital Stay: Payer: Medicare Other

## 2022-02-11 VITALS — BP 149/84 | HR 60 | Temp 98.1°F | Resp 20

## 2022-02-11 DIAGNOSIS — C187 Malignant neoplasm of sigmoid colon: Secondary | ICD-10-CM

## 2022-02-11 DIAGNOSIS — Z452 Encounter for adjustment and management of vascular access device: Secondary | ICD-10-CM | POA: Diagnosis not present

## 2022-02-11 DIAGNOSIS — Z5111 Encounter for antineoplastic chemotherapy: Secondary | ICD-10-CM | POA: Diagnosis not present

## 2022-02-11 DIAGNOSIS — C7801 Secondary malignant neoplasm of right lung: Secondary | ICD-10-CM | POA: Diagnosis not present

## 2022-02-11 DIAGNOSIS — C7802 Secondary malignant neoplasm of left lung: Secondary | ICD-10-CM | POA: Diagnosis not present

## 2022-02-11 MED ORDER — SODIUM CHLORIDE 0.9% FLUSH
10.0000 mL | INTRAVENOUS | Status: DC | PRN
  Administered 2022-02-11: 10 mL

## 2022-02-11 MED ORDER — HEPARIN SOD (PORK) LOCK FLUSH 100 UNIT/ML IV SOLN
500.0000 [IU] | Freq: Once | INTRAVENOUS | Status: AC | PRN
  Administered 2022-02-11: 500 [IU]

## 2022-02-11 NOTE — Patient Instructions (Signed)

## 2022-02-12 ENCOUNTER — Other Ambulatory Visit: Payer: Self-pay

## 2022-02-28 ENCOUNTER — Other Ambulatory Visit: Payer: Self-pay | Admitting: Oncology

## 2022-03-02 ENCOUNTER — Inpatient Hospital Stay: Payer: Medicare Other

## 2022-03-02 ENCOUNTER — Inpatient Hospital Stay: Payer: Medicare Other | Admitting: Nurse Practitioner

## 2022-03-02 ENCOUNTER — Encounter: Payer: Self-pay | Admitting: Nurse Practitioner

## 2022-03-02 ENCOUNTER — Other Ambulatory Visit (HOSPITAL_COMMUNITY): Payer: Self-pay

## 2022-03-02 VITALS — BP 157/81 | HR 68 | Temp 97.9°F | Resp 18 | Ht 68.0 in | Wt 200.6 lb

## 2022-03-02 VITALS — BP 157/76 | HR 60

## 2022-03-02 DIAGNOSIS — C187 Malignant neoplasm of sigmoid colon: Secondary | ICD-10-CM

## 2022-03-02 DIAGNOSIS — Z452 Encounter for adjustment and management of vascular access device: Secondary | ICD-10-CM | POA: Diagnosis not present

## 2022-03-02 DIAGNOSIS — C7802 Secondary malignant neoplasm of left lung: Secondary | ICD-10-CM | POA: Diagnosis not present

## 2022-03-02 DIAGNOSIS — C7801 Secondary malignant neoplasm of right lung: Secondary | ICD-10-CM | POA: Diagnosis not present

## 2022-03-02 DIAGNOSIS — Z5111 Encounter for antineoplastic chemotherapy: Secondary | ICD-10-CM | POA: Diagnosis not present

## 2022-03-02 LAB — CBC WITH DIFFERENTIAL (CANCER CENTER ONLY)
Abs Immature Granulocytes: 0.02 10*3/uL (ref 0.00–0.07)
Basophils Absolute: 0.1 10*3/uL (ref 0.0–0.1)
Basophils Relative: 2 %
Eosinophils Absolute: 0.4 10*3/uL (ref 0.0–0.5)
Eosinophils Relative: 7 %
HCT: 46.8 % (ref 39.0–52.0)
Hemoglobin: 15.8 g/dL (ref 13.0–17.0)
Immature Granulocytes: 0 %
Lymphocytes Relative: 31 %
Lymphs Abs: 2.1 10*3/uL (ref 0.7–4.0)
MCH: 32.2 pg (ref 26.0–34.0)
MCHC: 33.8 g/dL (ref 30.0–36.0)
MCV: 95.5 fL (ref 80.0–100.0)
Monocytes Absolute: 0.8 10*3/uL (ref 0.1–1.0)
Monocytes Relative: 12 %
Neutro Abs: 3.1 10*3/uL (ref 1.7–7.7)
Neutrophils Relative %: 48 %
Platelet Count: 334 10*3/uL (ref 150–400)
RBC: 4.9 MIL/uL (ref 4.22–5.81)
RDW: 14.3 % (ref 11.5–15.5)
WBC Count: 6.5 10*3/uL (ref 4.0–10.5)
nRBC: 0 % (ref 0.0–0.2)

## 2022-03-02 LAB — CMP (CANCER CENTER ONLY)
ALT: 13 U/L (ref 0–44)
AST: 17 U/L (ref 15–41)
Albumin: 4.2 g/dL (ref 3.5–5.0)
Alkaline Phosphatase: 54 U/L (ref 38–126)
Anion gap: 6 (ref 5–15)
BUN: 16 mg/dL (ref 8–23)
CO2: 29 mmol/L (ref 22–32)
Calcium: 9.5 mg/dL (ref 8.9–10.3)
Chloride: 102 mmol/L (ref 98–111)
Creatinine: 0.85 mg/dL (ref 0.61–1.24)
GFR, Estimated: >60 mL/min (ref >60)
Glucose, Bld: 114 mg/dL — ABNORMAL HIGH (ref 70–99)
Potassium: 3.8 mmol/L (ref 3.5–5.1)
Sodium: 137 mmol/L (ref 135–145)
Total Bilirubin: 0.5 mg/dL (ref 0.3–1.2)
Total Protein: 7.7 g/dL (ref 6.5–8.1)

## 2022-03-02 LAB — TOTAL PROTEIN, URINE DIPSTICK: Protein, ur: NEGATIVE mg/dL

## 2022-03-02 LAB — CEA (ACCESS): CEA (CHCC): 24.97 ng/mL — ABNORMAL HIGH (ref 0.00–5.00)

## 2022-03-02 MED ORDER — SODIUM CHLORIDE 0.9 % IV SOLN: Freq: Once | INTRAVENOUS | Status: AC

## 2022-03-02 MED ORDER — SODIUM CHLORIDE 0.9% FLUSH
10.0000 mL | INTRAVENOUS | Status: DC | PRN
  Administered 2022-03-02: 10 mL

## 2022-03-02 MED ORDER — CAPECITABINE 500 MG PO TABS
ORAL_TABLET | ORAL | 0 refills | Status: DC
  Filled 2022-03-02: qty 84, fill #0
  Filled 2022-03-04: qty 84, 28d supply, fill #0

## 2022-03-02 MED ORDER — SODIUM CHLORIDE 0.9 % IV SOLN
7.5000 mg/kg | Freq: Once | INTRAVENOUS | Status: AC
  Administered 2022-03-02: 700 mg via INTRAVENOUS
  Filled 2022-03-02: qty 12

## 2022-03-02 MED ORDER — HEPARIN SOD (PORK) LOCK FLUSH 100 UNIT/ML IV SOLN
500.0000 [IU] | Freq: Once | INTRAVENOUS | Status: AC | PRN
  Administered 2022-03-02: 500 [IU]

## 2022-03-02 NOTE — Patient Instructions (Signed)
[facility]  Discharge Instructions: Thank you for choosing [facility] to provide your oncology and hematology care.   If you have a lab appointment with the Cancer Center, please go directly to the Cancer Center and check in at the registration area.   Wear comfortable clothing and clothing appropriate for easy access to any Portacath or PICC line.   We strive to give you quality time with your provider. You may need to reschedule your appointment if you arrive late (15 or more minutes).  Arriving late affects you and other patients whose appointments are after yours.  Also, if you miss three or more appointments without notifying the office, you may be dismissed from the clinic at the provider's discretion.      For prescription refill requests, have your pharmacy contact our office and allow 72 hours for refills to be completed.    Today you received the following chemotherapy and/or immunotherapy agents: bevacizumab      To help prevent nausea and vomiting after your treatment, we encourage you to take your nausea medication as directed.  BELOW ARE SYMPTOMS THAT SHOULD BE REPORTED IMMEDIATELY: *FEVER GREATER THAN 100.4 F (38 C) OR HIGHER *CHILLS OR SWEATING *NAUSEA AND VOMITING THAT IS NOT CONTROLLED WITH YOUR NAUSEA MEDICATION *UNUSUAL SHORTNESS OF BREATH *UNUSUAL BRUISING OR BLEEDING *URINARY PROBLEMS (pain or burning when urinating, or frequent urination) *BOWEL PROBLEMS (unusual diarrhea, constipation, pain near the anus) TENDERNESS IN MOUTH AND THROAT WITH OR WITHOUT PRESENCE OF ULCERS (sore throat, sores in mouth, or a toothache) UNUSUAL RASH, SWELLING OR PAIN  UNUSUAL VAGINAL DISCHARGE OR ITCHING   Items with * indicate a potential emergency and should be followed up as soon as possible or go to the Emergency Department if any problems should occur.  Please show the CHEMOTHERAPY ALERT CARD or IMMUNOTHERAPY ALERT CARD at  check-in to the Emergency Department and triage nurse.  Should you have questions after your visit or need to cancel or reschedule your appointment, please contact [facility]  Dept: [phone]  and follow the prompts.  Office hours are 8:00 a.m. to 4:30 p.m. Monday - Friday. Please note that voicemails left after 4:00 p.m. may not be returned until the following business day.  We are closed weekends and major holidays. You have access to a nurse at all times for urgent questions. Please call the main number to the clinic Dept: [phone] and follow the prompts.   For any non-urgent questions, you may also contact your provider using MyChart. We now offer e-Visits for anyone 47 and older to request care online for non-urgent symptoms. For details visit [email or web address].   Also download the MyChart app! Go to the app store, search "MyChart", open the app, select [place], and log in with your MyChart username and password.  Bevacizumab Injection What is this medication? BEVACIZUMAB (be va SIZ yoo mab) treats some types of cancer. It works by blocking a protein that causes cancer cells to grow and multiply. This helps to slow or stop the spread of cancer cells. It is a monoclonal antibody. This medicine may be used for other purposes; ask your health care provider or pharmacist if you have questions. COMMON BRAND NAME(S): Alymsys, Avastin, MVASI, Omer Jack What should I tell my care team before I take this medication? They need to know if you have any of these conditions: Blood clots Coughing up blood Having or recent surgery Heart failure High  blood pressure History of a connection between 2 or more body parts that do not usually connect (fistula) History of a tear in your stomach or intestines Protein in your urine An unusual or allergic reaction to bevacizumab, other medications, foods, dyes, or preservatives Pregnant or trying to get  pregnant Breast-feeding How should I use this medication? This medication is injected into a vein. It is given by your care team in a hospital or clinic setting. Talk to your care team the use of this medication in children. Special care may be needed. Overdosage: If you think you have taken too much of this medicine contact a poison control center or emergency room at once. NOTE: This medicine is only for you. Do not share this medicine with others. What if I miss a dose? Keep appointments for follow-up doses. It is important not to miss your dose. Call your care team if you are unable to keep an appointment. What may interact with this medication? Interactions are not expected. This list may not describe all possible interactions. Give your health care provider a list of all the medicines, herbs, non-prescription drugs, or dietary supplements you use. Also tell them if you smoke, drink alcohol, or use illegal drugs. Some items may interact with your medicine. What should I watch for while using this medication? Your condition will be monitored carefully while you are receiving this medication. You may need blood work while taking this medication. This medication may make you feel generally unwell. This is not uncommon as chemotherapy can affect healthy cells as well as cancer cells. Report any side effects. Continue your course of treatment even though you feel ill unless your care team tells you to stop. This medication may increase your risk to bruise or bleed. Call your care team if you notice any unusual bleeding. Before having surgery, talk to your care team to make sure it is ok. This medication can increase the risk of poor healing of your surgical site or wound. You will need to stop this medication for 28 days before surgery. After surgery, wait at least 28 days before restarting this medication. Make sure the surgical site or wound is healed enough before restarting this medication. Talk  to your care team if questions. Talk to your care team if you may be pregnant. Serious birth defects can occur if you take this medication during pregnancy and for 6 months after the last dose. Contraception is recommended while taking this medication and for 6 months after the last dose. Your care team can help you find the option that works for you. Do not breastfeed while taking this medication and for 6 months after the last dose. This medication can cause infertility. Talk to your care team if you are concerned about your fertility. What side effects may I notice from receiving this medication? Side effects that you should report to your care team as soon as possible: Allergic reactions--skin rash, itching, hives, swelling of the face, lips, tongue, or throat Bleeding--bloody or black, tar-like stools, vomiting blood or brown material that looks like coffee grounds, red or dark brown urine, small red or purple spots on skin, unusual bruising or bleeding Blood clot--pain, swelling, or warmth in the leg, shortness of breath, chest pain Heart attack--pain or tightness in the chest, shoulders, arms, or jaw, nausea, shortness of breath, cold or clammy skin, feeling faint or lightheaded Heart failure--shortness of breath, swelling of the ankles, feet, or hands, sudden weight gain, unusual weakness or fatigue Increase  in blood pressure Infection--fever, chills, cough, sore throat, wounds that don't heal, pain or trouble when passing urine, general feeling of discomfort or being unwell Infusion reactions--chest pain, shortness of breath or trouble breathing, feeling faint or lightheaded Kidney injury--decrease in the amount of urine, swelling of the ankles, hands, or feet Stomach pain that is severe, does not go away, or gets worse Stroke--sudden numbness or weakness of the face, arm, or leg, trouble speaking, confusion, trouble walking, loss of balance or coordination, dizziness, severe headache,  change in vision Sudden and severe headache, confusion, change in vision, seizures, which may be signs of posterior reversible encephalopathy syndrome (PRES) Side effects that usually do not require medical attention (report to your care team if they continue or are bothersome): Back pain Change in taste Diarrhea Dry skin Increased tears Nosebleed This list may not describe all possible side effects. Call your doctor for medical advice about side effects. You may report side effects to FDA at 1-800-FDA-1088. Where should I keep my medication? This medication is given in a hospital or clinic. It will not be stored at home. NOTE: This sheet is a summary. It may not cover all possible information. If you have questions about this medicine, talk to your doctor, pharmacist, or health care provider.  2023 Elsevier/Gold Standard (2021-05-29 00:00:00)

## 2022-03-02 NOTE — Progress Notes (Signed)
Patient seen by Lonna Cobb NP today  Vitals are within treatment parameters.  Labs reviewed by Lonna Cobb NP and are within treatment parameters.  Per physician team, patient is ready for treatment. Please note that modifications are being made to the treatment plan including pt will be receiving Avastin only today per Lonna Cobb, NP.

## 2022-03-02 NOTE — Progress Notes (Signed)
[facility] OFFICE PROGRESS NOTE   Diagnosis: Colon cancer  INTERVAL HISTORY:   Craig Best returns as scheduled.  He completed another cycle of FOLFIRI/bevacizumab 02/09/2022.  He did not feel well by the time he got home from treatment.  He had nausea and a headache.  No mouth sores.  No significant diarrhea.  Main issue was a marked decline in his energy level.  Objective:  Vital signs in last 24 hours:  Blood pressure (!) 157/81, pulse 68, temperature 97.9 F (36.6 C), temperature source Oral, resp. rate 18, height 5\' 8"  (1.727 m), weight 200 lb 9.6 oz (91 kg), SpO2 100 %.    HEENT: No thrush or ulcers. Resp: Lungs clear bilaterally. Cardio: Regular rate and rhythm. GI: Abdomen soft and nontender.  No hepatosplenomegaly. Vascular: No leg edema. Neuro: Alert and oriented. Skin: Palms without erythema. Port-A-Cath without erythema.   Lab Results:  Lab Results  Component Value Date   WBC 6.5 03/02/2022   HGB 15.8 03/02/2022   HCT 46.8 03/02/2022   MCV 95.5 03/02/2022   PLT 334 03/02/2022   NEUTROABS 3.1 03/02/2022    Imaging:  No results found.  Medications: I have reviewed the patient's current medications.  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), status post a sigmoid colectomy and partial bladder resection 09/19/2020 2 separate sigmoid colon tumors, 2.5 cm apart, 1/13 lymph nodes positive, tumor extends to adherent soft tissue of the bladder, MSS, normal mismatch repair protein expression tumor mutation burden 0, K-ras G12D CT abdomen/pelvis 09/19/2020-sigmoid colon mass with colonic obstruction, 6 mm right lower lobe nodule CT chest 09/22/2020-multiple bilateral lung nodules consistent with metastases Elevated preoperative CEA Cycle 1 FOLFOX 10/28/2020 Cycle 2 FOLFOX 11/11/2020 Cycle 3 FOLFOX 11/25/2020 Cycle 4 FOLFOX 12/09/2020 Cycle 5 FOLFOX 12/23/2020 CT chest 12/25/2020-unchanged lung nodules, 2 cm subpleural nodule in  the left posterior chest more prominent-loculated fluid? Cycle 6 FOLFOX 02/03/2021-5-FU dose reduced and bolus eliminated secondary to diarrhea Cycle 7 FOLFOX 02/17/2021 Cycle 8 FOLFOX 03/03/2021 PET 03/17/2021 pulmonary nodules without significant change, 10 mm nodule in right middle lobe with mild FDG uptake, mildly hypermetabolic pleural nodule in the lower left hemothorax, new clustered nodularity in the apical and posterior right upper lobe-likely infectious CTs 07/23/2021-new left pleural effusion with multiple pleural nodules, increased size of several pulmonary nodules, stable left paracolic gutter nodule, morphologic features suggestive of early cirrhosis Thoracentesis 07/24/2021-1200 cc of fluid removed, rare atypical cells present Thoracentesis 627 2023-1.5 L of fluid removed, atypical cells present, acute inflammation and blood Thoracentesis 08/14/2021- 1.2 L of fluid removed, adenocarcinoma compatible with metastatic colon cancer Thoracentesis 08/21/2021-adenocarcinoma Cycle 1 FOLFIRI/bevacizumab 09/01/2021 Cycle 2 FOLFIRI/bevacizumab 09/15/2021 Cycle 3 FOLFIRI/bevacizumab 09/29/2021-Decadron prophylaxis added for delayed nausea Cycle 4 FOLFIRI/bevacizumab 10/13/2021 Cycle 5 FOLFIRI/bevacizumab 10/27/2021 CT chest 10/28/2021-improvement in left pleural effusion, rule/parenchymal metastases-some are slightly smaller, no new lesions Cycle 6 FOLFIRI/bevacizumab 11/10/2021 Cycle 7 FOLFIRI/bevacizumab 12/01/2021 Cycle 8 FOLFIRI/bevacizumab 12/22/2021 CT chest 01/07/2022-stable pulmonary parenchymal and pleural metastatic disease. Cycle 9 FOLFIRI/bevacizumab 02/09/2022 03/02/2022 treatment discontinued due to poor tolerance Xeloda 7 days on/7 days off, Avastin continued every 3 weeks  2.  Port-A-Cath placement-Dr. 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  He  completed a cycle of FOLFIRI/bevacizumab 3 weeks ago.  His performance status again declined.  He would like to discontinue the current treatment.  Dr. Hosie Poisson recommends changing treatment to Xeloda 7  days on/7 days off with continuation of Avastin every 3 weeks.  We reviewed potential side effects associated with Xeloda including bone marrow toxicity, nausea, mouth sores, diarrhea, rash, increased sensitivity to sun, hand-foot syndrome, skin hyperpigmentation.  He agrees to proceed.  Plan to proceed with Avastin today as scheduled.  Anticipate starting Xeloda 7 days on/7 days off 03/08/2022.  We will obtain a new baseline CT chest in the next week or so.  He will return for lab, follow-up, Avastin in 3 weeks.  We are available to see him sooner if needed.  Patient seen with Dr. Truett Perna.    Lonna Cobb ANP/GNP-BC   03/02/2022  9:10 AM  This was a shared visit with Lonna Cobb.  Craig Best has metastatic colon cancer.  He has been treated with FOLFIRI/bevacizumab since July 2023.  There is no clinical evidence of disease progression, but he now has poor tolerance of the systemic therapy.  We discussed treatment options.  I recommend changing to a maintenance regimen with Xeloda/bevacizumab.  We reviewed potential toxicities associated with Xeloda.  He agrees to proceed.  A Xeloda prescription was entered today.  I was present for greater than 50% of today's visit.  I performed medical decision making.  Mancel Bale, MD

## 2022-03-02 NOTE — Patient Instructions (Signed)

## 2022-03-03 ENCOUNTER — Encounter: Payer: Self-pay | Admitting: Oncology

## 2022-03-03 ENCOUNTER — Other Ambulatory Visit: Payer: Self-pay

## 2022-03-03 ENCOUNTER — Telehealth: Payer: Self-pay

## 2022-03-03 ENCOUNTER — Other Ambulatory Visit (HOSPITAL_COMMUNITY): Payer: Self-pay

## 2022-03-03 NOTE — Telephone Encounter (Signed)
Oral Oncology Patient Advocate Encounter  After completing a benefits investigation, prior authorization for Capecitabine is not required at this time through [employer].  Patient's copay is $21.65.     Berdine Addison, [facility] [place]  [phone] (phone) [phone] (fax) 03/03/2022 9:16 AM

## 2022-03-04 ENCOUNTER — Other Ambulatory Visit (HOSPITAL_COMMUNITY): Payer: Self-pay

## 2022-03-04 ENCOUNTER — Telehealth: Payer: Self-pay

## 2022-03-04 ENCOUNTER — Inpatient Hospital Stay: Payer: Medicare Other

## 2022-03-04 DIAGNOSIS — C187 Malignant neoplasm of sigmoid colon: Secondary | ICD-10-CM

## 2022-03-04 MED ORDER — CAPECITABINE 500 MG PO TABS
1500.0000 mg | ORAL_TABLET | Freq: Two times a day (BID) | ORAL | 0 refills | Status: DC
  Filled 2022-03-04: qty 84, 14d supply, fill #0

## 2022-03-04 NOTE — Telephone Encounter (Signed)
[employer] [facility] to deliver med to patient on 03/05/22. He knows to get started on 03/08/22,

## 2022-03-04 NOTE — Telephone Encounter (Signed)
Oral Oncology Pharmacist Encounter  Received new prescription for Xeloda (capecitabine) for the treatment of metastatic colon cancer in conjunction with bevacizumab, planned duration until disease progression or unacceptable drug toxicity.  CMP and CBC from 03/02/2022 assessed, all pertinent values are in range. Prescription dose and frequency assessed.   Current medication list in Epic reviewed, there is one DDI with Xeloda (capecitabine) identified: - The possibility of Qtc prolongation exists when using ondansetron with Xeloda. This is a Category C interaction (monitor). Ondansetron prescription is only for as needed and patient is not on any other Qtc prolonging medications.   Evaluated chart and there are no patient barriers to medication adherence identified.   Prescription has been e-scribed to the [facility] for benefits analysis and approval.  Oral Oncology Clinic will continue to follow for insurance authorization, copayment issues, initial counseling and start date.  Patient agreed to treatment on 03/02/2022 per MD documentation.  Laray Anger, PharmD PGY-2 Pharmacy Resident Hematology/Oncology [phone]  03/04/2022 8:47 AM

## 2022-03-04 NOTE — Telephone Encounter (Signed)
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   Pt is doing well. Counseled patient on administration, dosing, side effects, monitoring, drug-food interactions, safe handling, storage, and disposal. Patient will take 3 tablets (1500mg ) twice daily with food for 7 days on, 7 days off. Repeat every 14 days.  Side effects include but not limited to:  - Diarrhea - Mouth irritation - Hand Foot Syndrome - Nausea and Vomiting - Infection   - Anemia - Thrombocytopenia  Reviewed with patient importance of keeping a medication schedule and plan for any missed doses.  After discussion with patient there are patient barriers to medication adherence identified.   Mrs. Aragones voiced understanding and appreciation. All questions answered. Medication handout provided.  Provided patient with [facility] phone number. Patient knows to call the office with questions or concerns. Oral Chemotherapy [facility] will continue to follow.  Laray Anger, PharmD PGY-2 Pharmacy Resident Hematology/Oncology [phone]  03/04/2022 10:03 AM

## 2022-03-09 ENCOUNTER — Encounter (HOSPITAL_BASED_OUTPATIENT_CLINIC_OR_DEPARTMENT_OTHER): Payer: Self-pay

## 2022-03-09 ENCOUNTER — Ambulatory Visit (HOSPITAL_BASED_OUTPATIENT_CLINIC_OR_DEPARTMENT_OTHER)
Admission: RE | Admit: 2022-03-09 | Discharge: 2022-03-09 | Disposition: A | Discharge status: Home / Self Care | Payer: Medicare Other | Source: Ambulatory Visit | Attending: Nurse Practitioner | Admitting: Nurse Practitioner

## 2022-03-09 DIAGNOSIS — C187 Malignant neoplasm of sigmoid colon: Secondary | ICD-10-CM | POA: Insufficient documentation

## 2022-03-09 DIAGNOSIS — C189 Malignant neoplasm of colon, unspecified: Secondary | ICD-10-CM | POA: Diagnosis not present

## 2022-03-09 DIAGNOSIS — R918 Other nonspecific abnormal finding of lung field: Secondary | ICD-10-CM | POA: Diagnosis not present

## 2022-03-09 DIAGNOSIS — J90 Pleural effusion, not elsewhere classified: Secondary | ICD-10-CM | POA: Diagnosis not present

## 2022-03-10 ENCOUNTER — Telehealth: Payer: Self-pay

## 2022-03-10 NOTE — Telephone Encounter (Signed)
-----  Message from Ladell Pier, MD sent at 03/10/2022  7:55 AM EST ----- Please call patient, CT shows stable lung nodule and trace left pleural effusion, overall stable disease, plan to proceed with xeloda/avastin

## 2022-03-10 NOTE — Telephone Encounter (Signed)
Patient gave verbal understanding and had no further questions or concerns  

## 2022-03-11 ENCOUNTER — Other Ambulatory Visit: Payer: Self-pay | Admitting: Oncology

## 2022-03-11 DIAGNOSIS — C187 Malignant neoplasm of sigmoid colon: Secondary | ICD-10-CM

## 2022-03-18 ENCOUNTER — Other Ambulatory Visit (HOSPITAL_COMMUNITY): Payer: Self-pay

## 2022-03-21 ENCOUNTER — Other Ambulatory Visit: Payer: Self-pay | Admitting: Oncology

## 2022-03-23 ENCOUNTER — Inpatient Hospital Stay: Payer: Medicare Other

## 2022-03-23 ENCOUNTER — Inpatient Hospital Stay: Payer: Medicare Other | Admitting: Oncology

## 2022-03-24 ENCOUNTER — Other Ambulatory Visit (HOSPITAL_COMMUNITY): Payer: Self-pay

## 2022-03-24 ENCOUNTER — Other Ambulatory Visit: Payer: Self-pay

## 2022-03-25 ENCOUNTER — Other Ambulatory Visit: Payer: Self-pay

## 2022-03-26 ENCOUNTER — Other Ambulatory Visit: Payer: Self-pay | Admitting: Oncology

## 2022-03-26 ENCOUNTER — Other Ambulatory Visit (HOSPITAL_COMMUNITY): Payer: Self-pay

## 2022-03-26 ENCOUNTER — Other Ambulatory Visit: Payer: Self-pay

## 2022-03-26 DIAGNOSIS — C187 Malignant neoplasm of sigmoid colon: Secondary | ICD-10-CM

## 2022-03-26 MED ORDER — CAPECITABINE 500 MG PO TABS
1500.0000 mg | ORAL_TABLET | Freq: Two times a day (BID) | ORAL | 0 refills | Status: DC
  Filled 2022-03-26: qty 84, 28d supply, fill #0

## 2022-03-29 ENCOUNTER — Other Ambulatory Visit: Payer: Self-pay

## 2022-03-30 ENCOUNTER — Other Ambulatory Visit: Payer: Self-pay

## 2022-04-07 ENCOUNTER — Other Ambulatory Visit: Payer: Self-pay | Admitting: Oncology

## 2022-04-07 ENCOUNTER — Other Ambulatory Visit: Payer: Self-pay

## 2022-04-07 DIAGNOSIS — C187 Malignant neoplasm of sigmoid colon: Secondary | ICD-10-CM

## 2022-04-08 ENCOUNTER — Other Ambulatory Visit (HOSPITAL_COMMUNITY): Payer: Self-pay

## 2022-04-13 ENCOUNTER — Encounter: Payer: Self-pay | Admitting: Nurse Practitioner

## 2022-04-13 ENCOUNTER — Inpatient Hospital Stay: Payer: Medicare Other | Attending: Oncology

## 2022-04-13 ENCOUNTER — Inpatient Hospital Stay: Payer: Medicare Other

## 2022-04-13 ENCOUNTER — Telehealth: Payer: Self-pay | Admitting: *Deleted

## 2022-04-13 ENCOUNTER — Inpatient Hospital Stay: Payer: Medicare Other | Admitting: Nurse Practitioner

## 2022-04-13 VITALS — BP 147/72 | HR 65 | Temp 98.1°F | Resp 18 | Ht 68.0 in | Wt 202.6 lb

## 2022-04-13 VITALS — BP 157/77 | HR 60 | Resp 20

## 2022-04-13 DIAGNOSIS — C7802 Secondary malignant neoplasm of left lung: Secondary | ICD-10-CM | POA: Diagnosis not present

## 2022-04-13 DIAGNOSIS — Z5112 Encounter for antineoplastic immunotherapy: Secondary | ICD-10-CM | POA: Diagnosis not present

## 2022-04-13 DIAGNOSIS — C187 Malignant neoplasm of sigmoid colon: Secondary | ICD-10-CM | POA: Diagnosis not present

## 2022-04-13 DIAGNOSIS — C7801 Secondary malignant neoplasm of right lung: Secondary | ICD-10-CM | POA: Insufficient documentation

## 2022-04-13 LAB — CBC WITH DIFFERENTIAL (CANCER CENTER ONLY)
Abs Immature Granulocytes: 0.02 10*3/uL (ref 0.00–0.07)
Basophils Absolute: 0.1 10*3/uL (ref 0.0–0.1)
Basophils Relative: 2 %
Eosinophils Absolute: 0.6 10*3/uL — ABNORMAL HIGH (ref 0.0–0.5)
Eosinophils Relative: 7 %
HCT: 45.5 % (ref 39.0–52.0)
Hemoglobin: 15.4 g/dL (ref 13.0–17.0)
Immature Granulocytes: 0 %
Lymphocytes Relative: 26 %
Lymphs Abs: 2.1 10*3/uL (ref 0.7–4.0)
MCH: 31.6 pg (ref 26.0–34.0)
MCHC: 33.8 g/dL (ref 30.0–36.0)
MCV: 93.4 fL (ref 80.0–100.0)
Monocytes Absolute: 0.7 10*3/uL (ref 0.1–1.0)
Monocytes Relative: 8 %
Neutro Abs: 4.6 10*3/uL (ref 1.7–7.7)
Neutrophils Relative %: 57 %
Platelet Count: 290 10*3/uL (ref 150–400)
RBC: 4.87 MIL/uL (ref 4.22–5.81)
RDW: 15.5 % (ref 11.5–15.5)
WBC Count: 8.1 10*3/uL (ref 4.0–10.5)
nRBC: 0 % (ref 0.0–0.2)

## 2022-04-13 LAB — CMP (CANCER CENTER ONLY)
ALT: 9 U/L (ref 0–44)
AST: 16 U/L (ref 15–41)
Albumin: 4 g/dL (ref 3.5–5.0)
Alkaline Phosphatase: 58 U/L (ref 38–126)
Anion gap: 6 (ref 5–15)
BUN: 14 mg/dL (ref 8–23)
CO2: 29 mmol/L (ref 22–32)
Calcium: 9.2 mg/dL (ref 8.9–10.3)
Chloride: 102 mmol/L (ref 98–111)
Creatinine: 0.88 mg/dL (ref 0.61–1.24)
GFR, Estimated: >60 mL/min (ref >60)
Glucose, Bld: 117 mg/dL — ABNORMAL HIGH (ref 70–99)
Potassium: 3.9 mmol/L (ref 3.5–5.1)
Sodium: 137 mmol/L (ref 135–145)
Total Bilirubin: 0.5 mg/dL (ref 0.3–1.2)
Total Protein: 7.5 g/dL (ref 6.5–8.1)

## 2022-04-13 LAB — TOTAL PROTEIN, URINE DIPSTICK: Protein, ur: NEGATIVE mg/dL

## 2022-04-13 LAB — CEA (ACCESS): CEA (CHCC): 24.56 ng/mL — ABNORMAL HIGH (ref 0.00–5.00)

## 2022-04-13 MED ORDER — HEPARIN SOD (PORK) LOCK FLUSH 100 UNIT/ML IV SOLN
500.0000 [IU] | Freq: Once | INTRAVENOUS | Status: AC | PRN
  Administered 2022-04-13: 500 [IU]

## 2022-04-13 MED ORDER — PROCHLORPERAZINE MALEATE 10 MG PO TABS: 10.0000 mg | ORAL_TABLET | Freq: Four times a day (QID) | ORAL | 1 refills | Status: DC | PRN

## 2022-04-13 MED ORDER — SODIUM CHLORIDE 0.9% FLUSH
10.0000 mL | INTRAVENOUS | Status: DC | PRN
  Administered 2022-04-13: 10 mL

## 2022-04-13 MED ORDER — SODIUM CHLORIDE 0.9 % IV SOLN: Freq: Once | INTRAVENOUS | Status: AC

## 2022-04-13 MED ORDER — SODIUM CHLORIDE 0.9 % IV SOLN
7.5000 mg/kg | Freq: Once | INTRAVENOUS | Status: AC
  Administered 2022-04-13: 700 mg via INTRAVENOUS
  Filled 2022-04-13: qty 16

## 2022-04-13 NOTE — Progress Notes (Signed)
Patient seen by Lisa Thomas, NP today  Vitals are within treatment parameters.   Labs reviewed by Lisa Thomas, NP and are within treatment parameters.  Per physician team, patient is ready for treatment and there are NO modifications to the treatment plan. 

## 2022-04-13 NOTE — Patient Instructions (Signed)
[facility]   Discharge Instructions: Thank you for choosing [facility] to provide your oncology and hematology care.   If you have a lab appointment with the [facility], please go directly to the [facility] and check in at the registration area.   Wear comfortable clothing and clothing appropriate for easy access to any Portacath or PICC line.   We strive to give you quality time with your provider. You may need to reschedule your appointment if you arrive late (15 or more minutes).  Arriving late affects you and other patients whose appointments are after yours.  Also, if you miss three or more appointments without notifying the office, you may be dismissed from the clinic at the provider's discretion.      For prescription refill requests, have your pharmacy contact our office and allow 72 hours for refills to be completed.    Today you received the following chemotherapy and/or immunotherapy agents Bevacizumab-bvzr (ZIRABEV).      To help prevent nausea and vomiting after your treatment, we encourage you to take your nausea medication as directed.  BELOW ARE SYMPTOMS THAT SHOULD BE REPORTED IMMEDIATELY: *FEVER GREATER THAN 100.4 F (38 C) OR HIGHER *CHILLS OR SWEATING *NAUSEA AND VOMITING THAT IS NOT CONTROLLED WITH YOUR NAUSEA MEDICATION *UNUSUAL SHORTNESS OF BREATH *UNUSUAL BRUISING OR BLEEDING *URINARY PROBLEMS (pain or burning when urinating, or frequent urination) *BOWEL PROBLEMS (unusual diarrhea, constipation, pain near the anus) TENDERNESS IN MOUTH AND THROAT WITH OR WITHOUT PRESENCE OF ULCERS (sore throat, sores in mouth, or a toothache) UNUSUAL RASH, SWELLING OR PAIN  UNUSUAL VAGINAL DISCHARGE OR ITCHING   Items with * indicate a potential emergency and should be followed up as soon as possible or go to the Emergency Department if any problems should occur.  Please show the CHEMOTHERAPY ALERT CARD or IMMUNOTHERAPY  ALERT CARD at check-in to the Emergency Department and triage nurse.  Should you have questions after your visit or need to cancel or reschedule your appointment, please contact [facility]  Dept: [phone]  and follow the prompts.  Office hours are 8:00 a.m. to 4:30 p.m. Monday - Friday. Please note that voicemails left after 4:00 p.m. may not be returned until the following business day.  We are closed weekends and major holidays. You have access to a nurse at all times for urgent questions. Please call the main number to the clinic Dept: [phone] and follow the prompts.   For any non-urgent questions, you may also contact your provider using MyChart. We now offer e-Visits for anyone 40 and older to request care online for non-urgent symptoms. For details visit [email or web address].   Also download the MyChart app! Go to the app store, search "MyChart", open the app, select , and log in with your MyChart username and password.  Bevacizumab Injection What is this medication? BEVACIZUMAB (be va SIZ yoo mab) treats some types of cancer. It works by blocking a protein that causes cancer cells to grow and multiply. This helps to slow or stop the spread of cancer cells. It is a monoclonal antibody. This medicine may be used for other purposes; ask your health care provider or pharmacist if you have questions. COMMON BRAND NAME(S): Alymsys, Avastin, MVASI, Noah Charon What should I tell my care team before I take this medication? They need to know if you have any of these conditions: Blood clots Coughing up blood Having or recent surgery Heart  failure High blood pressure History of a connection between 2 or more body parts that do not usually connect (fistula) History of a tear in your stomach or intestines Protein in your urine An unusual or allergic reaction to bevacizumab, other medications, foods, dyes, or preservatives Pregnant or trying to  get pregnant Breast-feeding How should I use this medication? This medication is injected into a vein. It is given by your care team in a hospital or clinic setting. Talk to your care team the use of this medication in children. Special care may be needed. Overdosage: If you think you have taken too much of this medicine contact a poison control center or emergency room at once. NOTE: This medicine is only for you. Do not share this medicine with others. What if I miss a dose? Keep appointments for follow-up doses. It is important not to miss your dose. Call your care team if you are unable to keep an appointment. What may interact with this medication? Interactions are not expected. This list may not describe all possible interactions. Give your health care provider a list of all the medicines, herbs, non-prescription drugs, or dietary supplements you use. Also tell them if you smoke, drink alcohol, or use illegal drugs. Some items may interact with your medicine. What should I watch for while using this medication? Your condition will be monitored carefully while you are receiving this medication. You may need blood work while taking this medication. This medication may make you feel generally unwell. This is not uncommon as chemotherapy can affect healthy cells as well as cancer cells. Report any side effects. Continue your course of treatment even though you feel ill unless your care team tells you to stop. This medication may increase your risk to bruise or bleed. Call your care team if you notice any unusual bleeding. Before having surgery, talk to your care team to make sure it is ok. This medication can increase the risk of poor healing of your surgical site or wound. You will need to stop this medication for 28 days before surgery. After surgery, wait at least 28 days before restarting this medication. Make sure the surgical site or wound is healed enough before restarting this medication.  Talk to your care team if questions. Talk to your care team if you may be pregnant. Serious birth defects can occur if you take this medication during pregnancy and for 6 months after the last dose. Contraception is recommended while taking this medication and for 6 months after the last dose. Your care team can help you find the option that works for you. Do not breastfeed while taking this medication and for 6 months after the last dose. This medication can cause infertility. Talk to your care team if you are concerned about your fertility. What side effects may I notice from receiving this medication? Side effects that you should report to your care team as soon as possible: Allergic reactions--skin rash, itching, hives, swelling of the face, lips, tongue, or throat Bleeding--bloody or black, tar-like stools, vomiting blood or brown material that looks like coffee grounds, red or dark brown urine, small red or purple spots on skin, unusual bruising or bleeding Blood clot--pain, swelling, or warmth in the leg, shortness of breath, chest pain Heart attack--pain or tightness in the chest, shoulders, arms, or jaw, nausea, shortness of breath, cold or clammy skin, feeling faint or lightheaded Heart failure--shortness of breath, swelling of the ankles, feet, or hands, sudden weight gain, unusual weakness or  fatigue Increase in blood pressure Infection--fever, chills, cough, sore throat, wounds that don't heal, pain or trouble when passing urine, general feeling of discomfort or being unwell Infusion reactions--chest pain, shortness of breath or trouble breathing, feeling faint or lightheaded Kidney injury--decrease in the amount of urine, swelling of the ankles, hands, or feet Stomach pain that is severe, does not go away, or gets worse Stroke--sudden numbness or weakness of the face, arm, or leg, trouble speaking, confusion, trouble walking, loss of balance or coordination, dizziness, severe headache,  change in vision Sudden and severe headache, confusion, change in vision, seizures, which may be signs of posterior reversible encephalopathy syndrome (PRES) Side effects that usually do not require medical attention (report to your care team if they continue or are bothersome): Back pain Change in taste Diarrhea Dry skin Increased tears Nosebleed This list may not describe all possible side effects. Call your doctor for medical advice about side effects. You may report side effects to FDA at 1-800-FDA-1088. Where should I keep my medication? This medication is given in a hospital or clinic. It will not be stored at home. NOTE: This sheet is a summary. It may not cover all possible information. If you have questions about this medicine, talk to your doctor, pharmacist, or health care provider.  2023 Elsevier/Gold Standard (2021-05-29 00:00:00)

## 2022-04-13 NOTE — Progress Notes (Signed)
[facility] OFFICE PROGRESS NOTE   Diagnosis:  Colon cancer  INTERVAL HISTORY:   Mr. Brueckner returns as scheduled.  He began Xeloda 7 days on/7 days off on 03/08/2022.  He receives Avastin every 3 weeks, most recent 03/02/2022.  His wife reports he had to cancel treatment 03/23/2022 due to either COVID or the flu.  He has nausea after taking Xeloda, also belching.  Compazine helps.  No mouth sores.  No diarrhea.  No hand or foot pain or redness.  No bleeding.  No consistent abdominal pain.  He denies shortness of breath.  Main complaint is joint discomfort the 7 days he takes Xeloda.  Objective:  Vital signs in last 24 hours:  Blood pressure (!) 147/72, pulse 65, temperature 98.1 F (36.7 C), temperature source Oral, resp. rate 18, height '5\' 8"'$  (1.727 m), weight 202 lb 9.6 oz (91.9 kg), SpO2 98 %.    HEENT: No thrush or ulcers. Resp: Lungs clear bilaterally. Cardio: Regular rate and rhythm. GI: Abdomen soft and nontender.  No hepatosplenomegaly.  Left lower quadrant colostomy. Vascular: No leg edema. Musculoskeletal: No obvious inflamed joints. Skin: Palms without erythema. Port-A-Cath without erythema.   Lab Results:  Lab Results  Component Value Date   WBC 6.5 03/02/2022   HGB 15.8 03/02/2022   HCT 46.8 03/02/2022   MCV 95.5 03/02/2022   PLT 334 03/02/2022   NEUTROABS 3.1 03/02/2022    Imaging:  No results found.  Medications: I have reviewed the patient's current medications.  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), status post a sigmoid colectomy and partial bladder resection 09/19/2020 2 separate sigmoid colon tumors, 2.5 cm apart, 1/13 lymph nodes positive, tumor extends to adherent soft tissue of the bladder, MSS, normal mismatch repair protein expression tumor mutation burden 0, K-ras G12D CT abdomen/pelvis 09/19/2020-sigmoid colon mass with colonic obstruction, 6 mm right lower lobe nodule CT chest 09/22/2020-multiple  bilateral lung nodules consistent with metastases Elevated preoperative CEA Cycle 1 FOLFOX 10/28/2020 Cycle 2 FOLFOX 11/11/2020 Cycle 3 FOLFOX 11/25/2020 Cycle 4 FOLFOX 12/09/2020 Cycle 5 FOLFOX 12/23/2020 CT chest 12/25/2020-unchanged lung nodules, 2 cm subpleural nodule in the left posterior chest more prominent-loculated fluid? Cycle 6 FOLFOX 02/03/2021-5-FU dose reduced and bolus eliminated secondary to diarrhea Cycle 7 FOLFOX 02/17/2021 Cycle 8 FOLFOX 03/03/2021 PET 03/17/2021 pulmonary nodules without significant change, 10 mm nodule in right middle lobe with mild FDG uptake, mildly hypermetabolic pleural nodule in the lower left hemothorax, new clustered nodularity in the apical and posterior right upper lobe-likely infectious CTs 07/23/2021-new left pleural effusion with multiple pleural nodules, increased size of several pulmonary nodules, stable left paracolic gutter nodule, morphologic features suggestive of early cirrhosis Thoracentesis 07/24/2021-1200 cc of fluid removed, rare atypical cells present Thoracentesis 627 2023-1.5 L of fluid removed, atypical cells present, acute inflammation and blood Thoracentesis 08/14/2021- 1.2 L of fluid removed, adenocarcinoma compatible with metastatic colon cancer Thoracentesis 08/21/2021-adenocarcinoma Cycle 1 FOLFIRI/bevacizumab 09/01/2021 Cycle 2 FOLFIRI/bevacizumab 09/15/2021 Cycle 3 FOLFIRI/bevacizumab 09/29/2021-Decadron prophylaxis added for delayed nausea Cycle 4 FOLFIRI/bevacizumab 10/13/2021 Cycle 5 FOLFIRI/bevacizumab 10/27/2021 CT chest 10/28/2021-improvement in left pleural effusion, rule/parenchymal metastases-some are slightly smaller, no new lesions Cycle 6 FOLFIRI/bevacizumab 11/10/2021 Cycle 7 FOLFIRI/bevacizumab 12/01/2021 Cycle 8 FOLFIRI/bevacizumab 12/22/2021 CT chest 01/07/2022-stable pulmonary parenchymal and pleural metastatic disease. Cycle 9 FOLFIRI/bevacizumab 02/09/2022 03/02/2022 treatment discontinued due to poor tolerance Xeloda 7  days on/7 days off beginning 03/08/2022, Avastin continued every 3 weeks CT chest 03/09/2022-no significant change in appearance of bilateral pulmonary metastasis, trace left pleural effusion,  nodular liver coontour  2.  Port-A-Cath placement-Dr. 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 Cordle appears stable.  He will continue Xeloda 7 days on/7 days off and Avastin every 3 weeks.  He is due for Avastin today.  He will try Zofran 30 minutes prior to Xeloda to see if this helps with the nausea.  CBC and chemistry panel reviewed.  Labs adequate to proceed with treatment.  Urine negative for protein.  He will return for lab, follow-up, Avastin in 3 weeks.  He will contact the office in the interim with any problems.    Ned Card ANP/GNP-BC   04/13/2022  8:19 AM

## 2022-04-13 NOTE — Telephone Encounter (Signed)
-----   Message from Owens Shark, NP sent at 04/13/2022  9:51 AM EST ----- Please let him know CEA is stable.

## 2022-04-13 NOTE — Telephone Encounter (Signed)
Notified Mrs. Friedland that CEA is stable

## 2022-04-14 ENCOUNTER — Other Ambulatory Visit (HOSPITAL_COMMUNITY): Payer: Self-pay

## 2022-04-14 ENCOUNTER — Other Ambulatory Visit: Payer: Self-pay

## 2022-04-16 ENCOUNTER — Other Ambulatory Visit: Payer: Self-pay

## 2022-04-16 ENCOUNTER — Other Ambulatory Visit: Payer: Self-pay | Admitting: Oncology

## 2022-04-16 ENCOUNTER — Other Ambulatory Visit (HOSPITAL_COMMUNITY): Payer: Self-pay

## 2022-04-16 DIAGNOSIS — C187 Malignant neoplasm of sigmoid colon: Secondary | ICD-10-CM

## 2022-04-16 MED ORDER — CAPECITABINE 500 MG PO TABS
1500.0000 mg | ORAL_TABLET | Freq: Two times a day (BID) | ORAL | 0 refills | Status: DC
  Filled 2022-04-16: qty 84, 28d supply, fill #0

## 2022-04-19 ENCOUNTER — Other Ambulatory Visit (HOSPITAL_COMMUNITY): Payer: Self-pay

## 2022-04-30 ENCOUNTER — Other Ambulatory Visit: Payer: Self-pay

## 2022-05-04 ENCOUNTER — Inpatient Hospital Stay: Payer: Medicare Other | Admitting: Oncology

## 2022-05-04 ENCOUNTER — Inpatient Hospital Stay: Payer: Medicare Other

## 2022-05-04 DIAGNOSIS — C187 Malignant neoplasm of sigmoid colon: Secondary | ICD-10-CM

## 2022-05-11 ENCOUNTER — Other Ambulatory Visit: Payer: Self-pay

## 2022-05-14 DIAGNOSIS — H6691 Otitis media, unspecified, right ear: Secondary | ICD-10-CM | POA: Diagnosis not present

## 2022-05-14 DIAGNOSIS — R42 Dizziness and giddiness: Secondary | ICD-10-CM | POA: Diagnosis not present

## 2022-05-18 ENCOUNTER — Inpatient Hospital Stay: Payer: Medicare Other | Attending: Oncology

## 2022-05-18 ENCOUNTER — Inpatient Hospital Stay: Payer: Medicare Other

## 2022-05-18 ENCOUNTER — Inpatient Hospital Stay: Payer: Medicare Other | Admitting: Nurse Practitioner

## 2022-05-18 ENCOUNTER — Encounter: Payer: Self-pay | Admitting: Nurse Practitioner

## 2022-05-18 ENCOUNTER — Telehealth: Payer: Self-pay

## 2022-05-18 VITALS — BP 147/72 | HR 55 | Resp 18

## 2022-05-18 VITALS — BP 154/72 | HR 68 | Temp 98.1°F | Resp 18 | Ht 68.0 in | Wt 202.0 lb

## 2022-05-18 DIAGNOSIS — C7801 Secondary malignant neoplasm of right lung: Secondary | ICD-10-CM | POA: Insufficient documentation

## 2022-05-18 DIAGNOSIS — C187 Malignant neoplasm of sigmoid colon: Secondary | ICD-10-CM | POA: Diagnosis not present

## 2022-05-18 DIAGNOSIS — C7802 Secondary malignant neoplasm of left lung: Secondary | ICD-10-CM | POA: Diagnosis not present

## 2022-05-18 DIAGNOSIS — Z5112 Encounter for antineoplastic immunotherapy: Secondary | ICD-10-CM | POA: Diagnosis not present

## 2022-05-18 LAB — CMP (CANCER CENTER ONLY)
ALT: 11 U/L (ref 0–44)
AST: 14 U/L — ABNORMAL LOW (ref 15–41)
Albumin: 4.4 g/dL (ref 3.5–5.0)
Alkaline Phosphatase: 59 U/L (ref 38–126)
Anion gap: 7 (ref 5–15)
BUN: 17 mg/dL (ref 8–23)
CO2: 29 mmol/L (ref 22–32)
Calcium: 9.5 mg/dL (ref 8.9–10.3)
Chloride: 102 mmol/L (ref 98–111)
Creatinine: 0.79 mg/dL (ref 0.61–1.24)
GFR, Estimated: >60 mL/min (ref >60)
Glucose, Bld: 98 mg/dL (ref 70–99)
Potassium: 3.6 mmol/L (ref 3.5–5.1)
Sodium: 138 mmol/L (ref 135–145)
Total Bilirubin: 0.6 mg/dL (ref 0.3–1.2)
Total Protein: 7.3 g/dL (ref 6.5–8.1)

## 2022-05-18 LAB — CBC WITH DIFFERENTIAL (CANCER CENTER ONLY)
Abs Immature Granulocytes: 0.03 10*3/uL (ref 0.00–0.07)
Basophils Absolute: 0.1 10*3/uL (ref 0.0–0.1)
Basophils Relative: 1 %
Eosinophils Absolute: 0 10*3/uL (ref 0.0–0.5)
Eosinophils Relative: 0 %
HCT: 46.6 % (ref 39.0–52.0)
Hemoglobin: 15.8 g/dL (ref 13.0–17.0)
Immature Granulocytes: 0 %
Lymphocytes Relative: 23 %
Lymphs Abs: 2.5 10*3/uL (ref 0.7–4.0)
MCH: 31.8 pg (ref 26.0–34.0)
MCHC: 33.9 g/dL (ref 30.0–36.0)
MCV: 93.8 fL (ref 80.0–100.0)
Monocytes Absolute: 0.8 10*3/uL (ref 0.1–1.0)
Monocytes Relative: 7 %
Neutro Abs: 7.4 10*3/uL (ref 1.7–7.7)
Neutrophils Relative %: 69 %
Platelet Count: 323 10*3/uL (ref 150–400)
RBC: 4.97 MIL/uL (ref 4.22–5.81)
RDW: 16.2 % — ABNORMAL HIGH (ref 11.5–15.5)
WBC Count: 10.9 10*3/uL — ABNORMAL HIGH (ref 4.0–10.5)
nRBC: 0 % (ref 0.0–0.2)

## 2022-05-18 LAB — CEA (ACCESS): CEA (CHCC): 24.94 ng/mL — ABNORMAL HIGH (ref 0.00–5.00)

## 2022-05-18 LAB — TOTAL PROTEIN, URINE DIPSTICK: Protein, ur: NEGATIVE mg/dL

## 2022-05-18 MED ORDER — SODIUM CHLORIDE 0.9 % IV SOLN: Freq: Once | INTRAVENOUS | Status: AC

## 2022-05-18 MED ORDER — PROCHLORPERAZINE MALEATE 10 MG PO TABS
10.0000 mg | ORAL_TABLET | Freq: Four times a day (QID) | ORAL | 1 refills | Status: DC | PRN
Start: 2022-05-18 — End: 2023-09-20

## 2022-05-18 MED ORDER — ONDANSETRON HCL 8 MG PO TABS: 8.0000 mg | ORAL_TABLET | Freq: Three times a day (TID) | ORAL | 1 refills | Status: DC | PRN

## 2022-05-18 MED ORDER — SODIUM CHLORIDE 0.9% FLUSH
10.0000 mL | INTRAVENOUS | Status: DC | PRN
  Administered 2022-05-18: 10 mL

## 2022-05-18 MED ORDER — SODIUM CHLORIDE 0.9 % IV SOLN
7.5000 mg/kg | Freq: Once | INTRAVENOUS | Status: AC
  Administered 2022-05-18: 700 mg via INTRAVENOUS
  Filled 2022-05-18: qty 16

## 2022-05-18 MED ORDER — HEPARIN SOD (PORK) LOCK FLUSH 100 UNIT/ML IV SOLN
500.0000 [IU] | Freq: Once | INTRAVENOUS | Status: AC | PRN
  Administered 2022-05-18: 500 [IU]

## 2022-05-18 NOTE — Progress Notes (Signed)
Patient seen by Lisa Thomas NP today  Vitals are within treatment parameters.  Labs reviewed by Lisa Thomas NP and are within treatment parameters.  Per physician team, patient is ready for treatment and there are NO modifications to the treatment plan.     

## 2022-05-18 NOTE — Progress Notes (Signed)
[facility] OFFICE PROGRESS NOTE   Diagnosis: Colon cancer  INTERVAL HISTORY:   Mr. Cerreta returns as scheduled.  He continues Xeloda 7 days on/7 days off and every 3-week Avastin.  Last Avastin 04/13/2022.  He had to cancel treatment last week due to a water leak at his house.  He has mild intermittent nausea.  No vomiting.  No mouth sores.  Occasional loose stool.  No hand or foot pain or redness.  He notes that his finger joints become stiff the week he takes Xeloda.  He denies bleeding.  No shortness of breath.  Overall feels well.  Has had recent dizziness, now improved.  He reports he was evaluated and told the dizziness was due to "inner ear".  Objective:  Vital signs in last 24 hours:  Blood pressure (!) 154/72, pulse 68, temperature 98.1 F (36.7 C), temperature source Oral, resp. rate 18, height 5\' 8"  (1.727 m), weight 202 lb (91.6 kg), SpO2 100 %.    HEENT: No thrush or ulcers. Resp: Lungs clear bilaterally. Cardio: Regular rate and rhythm. GI: Abdomen soft and nontender.  No hepatosplenomegaly.  Left lower quadrant colostomy with soft brown stool in the collection bag. Vascular: No leg edema. Neuro: Alert and oriented. Skin: Palms with hyperpigmentation, no erythema or skin breakdown. Scar just superior to the Port-A-Cath infusion device has a small scab, question retained suture.  Lab Results:  Lab Results  Component Value Date   WBC 10.9 (H) 05/18/2022   HGB 15.8 05/18/2022   HCT 46.6 05/18/2022   MCV 93.8 05/18/2022   PLT 323 05/18/2022   NEUTROABS 7.4 05/18/2022    Imaging:  No results found.  Medications: I have reviewed the patient's current medications.  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), status post a sigmoid colectomy and partial bladder resection 09/19/2020 2 separate sigmoid colon tumors, 2.5 cm apart, 1/13 lymph nodes positive, tumor extends to adherent soft tissue of the bladder, MSS, normal mismatch  repair protein expression tumor mutation burden 0, K-ras G12D CT abdomen/pelvis 09/19/2020-sigmoid colon mass with colonic obstruction, 6 mm right lower lobe nodule CT chest 09/22/2020-multiple bilateral lung nodules consistent with metastases Elevated preoperative CEA Cycle 1 FOLFOX 10/28/2020 Cycle 2 FOLFOX 11/11/2020 Cycle 3 FOLFOX 11/25/2020 Cycle 4 FOLFOX 12/09/2020 Cycle 5 FOLFOX 12/23/2020 CT chest 12/25/2020-unchanged lung nodules, 2 cm subpleural nodule in the left posterior chest more prominent-loculated fluid? Cycle 6 FOLFOX 02/03/2021-5-FU dose reduced and bolus eliminated secondary to diarrhea Cycle 7 FOLFOX 02/17/2021 Cycle 8 FOLFOX 03/03/2021 PET 03/17/2021 pulmonary nodules without significant change, 10 mm nodule in right middle lobe with mild FDG uptake, mildly hypermetabolic pleural nodule in the lower left hemothorax, new clustered nodularity in the apical and posterior right upper lobe-likely infectious CTs 07/23/2021-new left pleural effusion with multiple pleural nodules, increased size of several pulmonary nodules, stable left paracolic gutter nodule, morphologic features suggestive of early cirrhosis Thoracentesis 07/24/2021-1200 cc of fluid removed, rare atypical cells present Thoracentesis 627 2023-1.5 L of fluid removed, atypical cells present, acute inflammation and blood Thoracentesis 08/14/2021- 1.2 L of fluid removed, adenocarcinoma compatible with metastatic colon cancer Thoracentesis 08/21/2021-adenocarcinoma Cycle 1 FOLFIRI/bevacizumab 09/01/2021 Cycle 2 FOLFIRI/bevacizumab 09/15/2021 Cycle 3 FOLFIRI/bevacizumab 09/29/2021-Decadron prophylaxis added for delayed nausea Cycle 4 FOLFIRI/bevacizumab 10/13/2021 Cycle 5 FOLFIRI/bevacizumab 10/27/2021 CT chest 10/28/2021-improvement in left pleural effusion, rule/parenchymal metastases-some are slightly smaller, no new lesions Cycle 6 FOLFIRI/bevacizumab 11/10/2021 Cycle 7 FOLFIRI/bevacizumab 12/01/2021 Cycle 8 FOLFIRI/bevacizumab  12/22/2021 CT chest 01/07/2022-stable pulmonary parenchymal and pleural metastatic disease. Cycle 9  FOLFIRI/bevacizumab 02/09/2022 03/02/2022 treatment discontinued due to poor tolerance Xeloda 7 days on/7 days off beginning 03/08/2022, Avastin continued every 3 weeks CT chest 03/09/2022-no significant change in appearance of bilateral pulmonary metastasis, trace left pleural effusion, nodular liver contour Xeloda 7 days on/7 days off and Avastin every 3 weeks  2.  Port-A-Cath placement-Dr. 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 Hosie PoissonSumner appears stable.  He continues Xeloda 7 days on/7 days off and Avastin every 3 weeks.  He seems to be tolerating treatment well.  Plan to proceed with Avastin today as scheduled.  Restaging chest CT prior to next office visit.  CBC and chemistry panel reviewed.  Labs adequate to proceed as above.  He will follow-up with Dr. Magnus IvanBlackman regarding the apparent retained suture at the Port-A-Cath scar.  He will return for lab, follow-up, Avastin in 3 weeks.  He will contact the office in the interim with any problems.    Lonna CobbLisa Dreamer Carillo ANP/GNP-BC   05/18/2022  8:52 AM

## 2022-05-18 NOTE — Patient Instructions (Signed)
[facility]   Discharge Instructions: Thank you for choosing [facility] to provide your oncology and hematology care.   If you have a lab appointment with the Cancer Center, please go directly to the Cancer Center and check in at the registration area.   Wear comfortable clothing and clothing appropriate for easy access to any Portacath or PICC line.   We strive to give you quality time with your provider. You may need to reschedule your appointment if you arrive late (15 or more minutes).  Arriving late affects you and other patients whose appointments are after yours.  Also, if you miss three or more appointments without notifying the office, you may be dismissed from the clinic at the provider's discretion.      For prescription refill requests, have your pharmacy contact our office and allow 72 hours for refills to be completed.    Today you received the following chemotherapy and/or immunotherapy agents Bevacizumab-bvzr (ZIRABEV).      To help prevent nausea and vomiting after your treatment, we encourage you to take your nausea medication as directed.  BELOW ARE SYMPTOMS THAT SHOULD BE REPORTED IMMEDIATELY: *FEVER GREATER THAN 100.4 F (38 C) OR HIGHER *CHILLS OR SWEATING *NAUSEA AND VOMITING THAT IS NOT CONTROLLED WITH YOUR NAUSEA MEDICATION *UNUSUAL SHORTNESS OF BREATH *UNUSUAL BRUISING OR BLEEDING *URINARY PROBLEMS (pain or burning when urinating, or frequent urination) *BOWEL PROBLEMS (unusual diarrhea, constipation, pain near the anus) TENDERNESS IN MOUTH AND THROAT WITH OR WITHOUT PRESENCE OF ULCERS (sore throat, sores in mouth, or a toothache) UNUSUAL RASH, SWELLING OR PAIN  UNUSUAL VAGINAL DISCHARGE OR ITCHING   Items with * indicate a potential emergency and should be followed up as soon as possible or go to the Emergency Department if any problems should occur.  Please show the CHEMOTHERAPY ALERT CARD or IMMUNOTHERAPY  ALERT CARD at check-in to the Emergency Department and triage nurse.  Should you have questions after your visit or need to cancel or reschedule your appointment, please contact [facility]  Dept: [phone]  and follow the prompts.  Office hours are 8:00 a.m. to 4:30 p.m. Monday - Friday. Please note that voicemails left after 4:00 p.m. may not be returned until the following business day.  We are closed weekends and major holidays. You have access to a nurse at all times for urgent questions. Please call the main number to the clinic Dept: [phone] and follow the prompts.   For any non-urgent questions, you may also contact your provider using MyChart. We now offer e-Visits for anyone 18 and older to request care online for non-urgent symptoms. For details visit [email or web address].   Also download the MyChart app! Go to the app store, search "MyChart", open the app, select , and log in with your MyChart username and password.  Bevacizumab Injection What is this medication? BEVACIZUMAB (be va SIZ yoo mab) treats some types of cancer. It works by blocking a protein that causes cancer cells to grow and multiply. This helps to slow or stop the spread of cancer cells. It is a monoclonal antibody. This medicine may be used for other purposes; ask your health care provider or pharmacist if you have questions. COMMON BRAND NAME(S): Alymsys, Avastin, MVASI, Zirabev What should I tell my care team before I take this medication? They need to know if you have any of these conditions: Blood clots Coughing up blood Having or recent surgery Heart   failure High blood pressure History of a connection between 2 or more body parts that do not usually connect (fistula) History of a tear in your stomach or intestines Protein in your urine An unusual or allergic reaction to bevacizumab, other medications, foods, dyes, or preservatives Pregnant or trying to  get pregnant Breast-feeding How should I use this medication? This medication is injected into a vein. It is given by your care team in a hospital or clinic setting. Talk to your care team the use of this medication in children. Special care may be needed. Overdosage: If you think you have taken too much of this medicine contact a poison control center or emergency room at once. NOTE: This medicine is only for you. Do not share this medicine with others. What if I miss a dose? Keep appointments for follow-up doses. It is important not to miss your dose. Call your care team if you are unable to keep an appointment. What may interact with this medication? Interactions are not expected. This list may not describe all possible interactions. Give your health care provider a list of all the medicines, herbs, non-prescription drugs, or dietary supplements you use. Also tell them if you smoke, drink alcohol, or use illegal drugs. Some items may interact with your medicine. What should I watch for while using this medication? Your condition will be monitored carefully while you are receiving this medication. You may need blood work while taking this medication. This medication may make you feel generally unwell. This is not uncommon as chemotherapy can affect healthy cells as well as cancer cells. Report any side effects. Continue your course of treatment even though you feel ill unless your care team tells you to stop. This medication may increase your risk to bruise or bleed. Call your care team if you notice any unusual bleeding. Before having surgery, talk to your care team to make sure it is ok. This medication can increase the risk of poor healing of your surgical site or wound. You will need to stop this medication for 28 days before surgery. After surgery, wait at least 28 days before restarting this medication. Make sure the surgical site or wound is healed enough before restarting this medication.  Talk to your care team if questions. Talk to your care team if you may be pregnant. Serious birth defects can occur if you take this medication during pregnancy and for 6 months after the last dose. Contraception is recommended while taking this medication and for 6 months after the last dose. Your care team can help you find the option that works for you. Do not breastfeed while taking this medication and for 6 months after the last dose. This medication can cause infertility. Talk to your care team if you are concerned about your fertility. What side effects may I notice from receiving this medication? Side effects that you should report to your care team as soon as possible: Allergic reactions--skin rash, itching, hives, swelling of the face, lips, tongue, or throat Bleeding--bloody or black, tar-like stools, vomiting blood or brown material that looks like coffee grounds, red or dark brown urine, small red or purple spots on skin, unusual bruising or bleeding Blood clot--pain, swelling, or warmth in the leg, shortness of breath, chest pain Heart attack--pain or tightness in the chest, shoulders, arms, or jaw, nausea, shortness of breath, cold or clammy skin, feeling faint or lightheaded Heart failure--shortness of breath, swelling of the ankles, feet, or hands, sudden weight gain, unusual weakness or   fatigue Increase in blood pressure Infection--fever, chills, cough, sore throat, wounds that don't heal, pain or trouble when passing urine, general feeling of discomfort or being unwell Infusion reactions--chest pain, shortness of breath or trouble breathing, feeling faint or lightheaded Kidney injury--decrease in the amount of urine, swelling of the ankles, hands, or feet Stomach pain that is severe, does not go away, or gets worse Stroke--sudden numbness or weakness of the face, arm, or leg, trouble speaking, confusion, trouble walking, loss of balance or coordination, dizziness, severe headache,  change in vision Sudden and severe headache, confusion, change in vision, seizures, which may be signs of posterior reversible encephalopathy syndrome (PRES) Side effects that usually do not require medical attention (report to your care team if they continue or are bothersome): Back pain Change in taste Diarrhea Dry skin Increased tears Nosebleed This list may not describe all possible side effects. Call your doctor for medical advice about side effects. You may report side effects to FDA at 1-800-FDA-1088. Where should I keep my medication? This medication is given in a hospital or clinic. It will not be stored at home. NOTE: This sheet is a summary. It may not cover all possible information. If you have questions about this medicine, talk to your doctor, pharmacist, or health care provider.  2023 Elsevier/Gold Standard (2021-05-29 00:00:00)  

## 2022-05-18 NOTE — Telephone Encounter (Signed)
Patient is currently cea is stable condition and has been informed of his CT chest without contrast at [facility]. The patient demonstrated comprehension and did not express any additional questions or concerns.

## 2022-05-23 ENCOUNTER — Other Ambulatory Visit: Payer: Self-pay

## 2022-05-25 ENCOUNTER — Ambulatory Visit: Payer: Medicare Other

## 2022-05-25 ENCOUNTER — Other Ambulatory Visit: Payer: Medicare Other

## 2022-05-25 ENCOUNTER — Ambulatory Visit: Payer: Medicare Other | Admitting: Oncology

## 2022-05-27 ENCOUNTER — Other Ambulatory Visit (HOSPITAL_COMMUNITY): Payer: Self-pay

## 2022-05-27 ENCOUNTER — Other Ambulatory Visit: Payer: Self-pay | Admitting: Oncology

## 2022-05-27 DIAGNOSIS — C187 Malignant neoplasm of sigmoid colon: Secondary | ICD-10-CM

## 2022-05-28 ENCOUNTER — Other Ambulatory Visit: Payer: Self-pay

## 2022-05-28 DIAGNOSIS — H66002 Acute suppurative otitis media without spontaneous rupture of ear drum, left ear: Secondary | ICD-10-CM | POA: Diagnosis not present

## 2022-05-28 DIAGNOSIS — H6122 Impacted cerumen, left ear: Secondary | ICD-10-CM | POA: Diagnosis not present

## 2022-05-28 DIAGNOSIS — H8112 Benign paroxysmal vertigo, left ear: Secondary | ICD-10-CM | POA: Diagnosis not present

## 2022-05-28 MED ORDER — CAPECITABINE 500 MG PO TABS
1500.0000 mg | ORAL_TABLET | Freq: Two times a day (BID) | ORAL | 0 refills | Status: DC
  Filled 2022-05-28: qty 84, 14d supply, fill #0

## 2022-05-30 ENCOUNTER — Other Ambulatory Visit: Payer: Self-pay

## 2022-06-01 ENCOUNTER — Encounter (HOSPITAL_BASED_OUTPATIENT_CLINIC_OR_DEPARTMENT_OTHER): Payer: Self-pay

## 2022-06-01 ENCOUNTER — Ambulatory Visit (HOSPITAL_BASED_OUTPATIENT_CLINIC_OR_DEPARTMENT_OTHER)
Admission: RE | Admit: 2022-06-01 | Discharge: 2022-06-01 | Disposition: A | Discharge status: Home / Self Care | Payer: Medicare Other | Source: Ambulatory Visit | Attending: Nurse Practitioner | Admitting: Nurse Practitioner

## 2022-06-01 DIAGNOSIS — C187 Malignant neoplasm of sigmoid colon: Secondary | ICD-10-CM | POA: Diagnosis not present

## 2022-06-01 DIAGNOSIS — C189 Malignant neoplasm of colon, unspecified: Secondary | ICD-10-CM | POA: Diagnosis not present

## 2022-06-01 DIAGNOSIS — J90 Pleural effusion, not elsewhere classified: Secondary | ICD-10-CM | POA: Diagnosis not present

## 2022-06-01 DIAGNOSIS — R918 Other nonspecific abnormal finding of lung field: Secondary | ICD-10-CM | POA: Diagnosis not present

## 2022-06-06 ENCOUNTER — Other Ambulatory Visit: Payer: Self-pay | Admitting: Oncology

## 2022-06-07 ENCOUNTER — Other Ambulatory Visit: Payer: Self-pay

## 2022-06-07 DIAGNOSIS — H81399 Other peripheral vertigo, unspecified ear: Secondary | ICD-10-CM | POA: Diagnosis not present

## 2022-06-07 DIAGNOSIS — R42 Dizziness and giddiness: Secondary | ICD-10-CM | POA: Diagnosis not present

## 2022-06-08 ENCOUNTER — Inpatient Hospital Stay: Payer: Medicare Other

## 2022-06-08 ENCOUNTER — Inpatient Hospital Stay: Payer: Medicare Other | Admitting: Oncology

## 2022-06-08 VITALS — BP 149/70 | HR 63 | Temp 98.2°F | Resp 18 | Ht 68.0 in | Wt 200.2 lb

## 2022-06-08 DIAGNOSIS — Z95828 Presence of other vascular implants and grafts: Secondary | ICD-10-CM

## 2022-06-08 DIAGNOSIS — Z5112 Encounter for antineoplastic immunotherapy: Secondary | ICD-10-CM | POA: Diagnosis not present

## 2022-06-08 DIAGNOSIS — C7801 Secondary malignant neoplasm of right lung: Secondary | ICD-10-CM | POA: Diagnosis not present

## 2022-06-08 DIAGNOSIS — C187 Malignant neoplasm of sigmoid colon: Secondary | ICD-10-CM

## 2022-06-08 DIAGNOSIS — C7802 Secondary malignant neoplasm of left lung: Secondary | ICD-10-CM | POA: Diagnosis not present

## 2022-06-08 LAB — CMP (CANCER CENTER ONLY)
ALT: 22 U/L (ref 0–44)
AST: 20 U/L (ref 15–41)
Albumin: 3.9 g/dL (ref 3.5–5.0)
Alkaline Phosphatase: 64 U/L (ref 38–126)
Anion gap: 7 (ref 5–15)
BUN: 13 mg/dL (ref 8–23)
CO2: 28 mmol/L (ref 22–32)
Calcium: 8.7 mg/dL — ABNORMAL LOW (ref 8.9–10.3)
Chloride: 102 mmol/L (ref 98–111)
Creatinine: 0.83 mg/dL (ref 0.61–1.24)
GFR, Estimated: >60 mL/min (ref >60)
Glucose, Bld: 115 mg/dL — ABNORMAL HIGH (ref 70–99)
Potassium: 3.6 mmol/L (ref 3.5–5.1)
Sodium: 137 mmol/L (ref 135–145)
Total Bilirubin: 0.8 mg/dL (ref 0.3–1.2)
Total Protein: 6.6 g/dL (ref 6.5–8.1)

## 2022-06-08 LAB — CBC WITH DIFFERENTIAL (CANCER CENTER ONLY)
Abs Immature Granulocytes: 0.03 10*3/uL (ref 0.00–0.07)
Basophils Absolute: 0.1 10*3/uL (ref 0.0–0.1)
Basophils Relative: 1 %
Eosinophils Absolute: 0.2 10*3/uL (ref 0.0–0.5)
Eosinophils Relative: 2 %
HCT: 47 % (ref 39.0–52.0)
Hemoglobin: 16.1 g/dL (ref 13.0–17.0)
Immature Granulocytes: 0 %
Lymphocytes Relative: 27 %
Lymphs Abs: 2.6 10*3/uL (ref 0.7–4.0)
MCH: 32.7 pg (ref 26.0–34.0)
MCHC: 34.3 g/dL (ref 30.0–36.0)
MCV: 95.3 fL (ref 80.0–100.0)
Monocytes Absolute: 0.7 10*3/uL (ref 0.1–1.0)
Monocytes Relative: 7 %
Neutro Abs: 6 10*3/uL (ref 1.7–7.7)
Neutrophils Relative %: 63 %
Platelet Count: 221 10*3/uL (ref 150–400)
RBC: 4.93 MIL/uL (ref 4.22–5.81)
RDW: 16.6 % — ABNORMAL HIGH (ref 11.5–15.5)
WBC Count: 9.5 10*3/uL (ref 4.0–10.5)
nRBC: 0 % (ref 0.0–0.2)

## 2022-06-08 LAB — TOTAL PROTEIN, URINE DIPSTICK: Protein, ur: NEGATIVE mg/dL

## 2022-06-08 MED ORDER — HEPARIN SOD (PORK) LOCK FLUSH 100 UNIT/ML IV SOLN
500.0000 [IU] | Freq: Once | INTRAVENOUS | Status: AC
  Administered 2022-06-08: 500 [IU] via INTRAVENOUS

## 2022-06-08 MED ORDER — SODIUM CHLORIDE 0.9% FLUSH
10.0000 mL | INTRAVENOUS | Status: DC | PRN
  Administered 2022-06-08: 10 mL via INTRAVENOUS

## 2022-06-08 NOTE — Progress Notes (Signed)
[facility] OFFICE PROGRESS NOTE   Diagnosis: Colon cancer  INTERVAL HISTORY:   Craig Best returns as scheduled.  He is due to start another week of Xeloda today.  He has been maintained on every 3-week bevacizumab, last given 05/18/2022.  He complains of intermittent "dizziness ".  He describes vertigo lasting for seconds to a few minutes.  The vertigo occurs with position change.  He has been evaluated at an urgent care.  He reports being diagnosed with an ear infection treated with antibiotic drops.  He was prescribed meclizine last week.  He complains of malaise.  Good appetite.  No dyspnea.  Objective:  Vital signs in last 24 hours:  Blood pressure (!) 149/70, pulse 63, temperature 98.2 F (36.8 C), temperature source Oral, resp. rate 18, height 5\' 8"  (1.727 m), weight 200 lb 3.2 oz (90.8 kg), SpO2 99 %.    HEENT: No thrush or ulcers, there appears to be inflammation at both external canals Resp: Lungs clear bilaterally, no respiratory distress Cardio: Regular rate and rhythm GI: No hepatosplenomegaly, nontender Vascular: No leg edema    Portacath/PICC-without erythema  Lab Results:  Lab Results  Component Value Date   WBC 9.5 06/08/2022   HGB 16.1 06/08/2022   HCT 47.0 06/08/2022   MCV 95.3 06/08/2022   PLT 221 06/08/2022   NEUTROABS 6.0 06/08/2022    CMP  Lab Results  Component Value Date   NA 137 06/08/2022   K 3.6 06/08/2022   CL 102 06/08/2022   CO2 28 06/08/2022   GLUCOSE 115 (H) 06/08/2022   BUN 13 06/08/2022   CREATININE 0.83 06/08/2022   CALCIUM 8.7 (L) 06/08/2022   PROT 6.6 06/08/2022   ALBUMIN 3.9 06/08/2022   AST 20 06/08/2022   ALT 22 06/08/2022   ALKPHOS 64 06/08/2022   BILITOT 0.8 06/08/2022   GFRNONAA >60 06/08/2022    Lab Results  Component Value Date   CEA1 63.2 (H) 09/19/2020   CEA 24.94 (H) 05/18/2022    No results found for: "INR", "LABPROT"  Imaging:  No results found.  Medications: I have reviewed the  patient's current medications.   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), status post a sigmoid colectomy and partial bladder resection 09/19/2020 2 separate sigmoid colon tumors, 2.5 cm apart, 1/13 lymph nodes positive, tumor extends to adherent soft tissue of the bladder, MSS, normal mismatch repair protein expression tumor mutation burden 0, K-ras G12D CT abdomen/pelvis 09/19/2020-sigmoid colon mass with colonic obstruction, 6 mm right lower lobe nodule CT chest 09/22/2020-multiple bilateral lung nodules consistent with metastases Elevated preoperative CEA Cycle 1 FOLFOX 10/28/2020 Cycle 2 FOLFOX 11/11/2020 Cycle 3 FOLFOX 11/25/2020 Cycle 4 FOLFOX 12/09/2020 Cycle 5 FOLFOX 12/23/2020 CT chest 12/25/2020-unchanged lung nodules, 2 cm subpleural nodule in the left posterior chest more prominent-loculated fluid? Cycle 6 FOLFOX 02/03/2021-5-FU dose reduced and bolus eliminated secondary to diarrhea Cycle 7 FOLFOX 02/17/2021 Cycle 8 FOLFOX 03/03/2021 PET 03/17/2021 pulmonary nodules without significant change, 10 mm nodule in right middle lobe with mild FDG uptake, mildly hypermetabolic pleural nodule in the lower left hemothorax, new clustered nodularity in the apical and posterior right upper lobe-likely infectious CTs 07/23/2021-new left pleural effusion with multiple pleural nodules, increased size of several pulmonary nodules, stable left paracolic gutter nodule, morphologic features suggestive of early cirrhosis Thoracentesis 07/24/2021-1200 cc of fluid removed, rare atypical cells present Thoracentesis 627 2023-1.5 L of fluid removed, atypical cells present, acute inflammation and blood Thoracentesis 08/14/2021- 1.2 L of fluid removed, adenocarcinoma  compatible with metastatic colon cancer Thoracentesis 08/21/2021-adenocarcinoma Cycle 1 FOLFIRI/bevacizumab 09/01/2021 Cycle 2 FOLFIRI/bevacizumab 09/15/2021 Cycle 3 FOLFIRI/bevacizumab 09/29/2021-Decadron prophylaxis added for delayed  nausea Cycle 4 FOLFIRI/bevacizumab 10/13/2021 Cycle 5 FOLFIRI/bevacizumab 10/27/2021 CT chest 10/28/2021-improvement in left pleural effusion, rule/parenchymal metastases-some are slightly smaller, no new lesions Cycle 6 FOLFIRI/bevacizumab 11/10/2021 Cycle 7 FOLFIRI/bevacizumab 12/01/2021 Cycle 8 FOLFIRI/bevacizumab 12/22/2021 CT chest 01/07/2022-stable pulmonary parenchymal and pleural metastatic disease. Cycle 9 FOLFIRI/bevacizumab 02/09/2022 03/02/2022 treatment discontinued due to poor tolerance Xeloda 7 days on/7 days off beginning 03/08/2022, Avastin continued every 3 weeks CT chest 03/09/2022-no significant change in appearance of bilateral pulmonary metastasis, trace left pleural effusion, nodular liver contour Xeloda 7 days on/7 days off and Avastin every 3 weeks CT chest 06/01/2022-mild enlargement of several pulmonary metastases, trace left pleural effusion  2.  Port-A-Cath placement-Dr. 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  Vertigo April 2024     Disposition: Craig Best has metastatic colon cancer.  He has been maintained on Xeloda/Avastin since January.  His clinical status is stable.  I reviewed the restaging CT findings and images with him.  The CT is consistent with overall stable disease.  The plan is to continue Xeloda/Avastin.  He has developed vertigo over the past several weeks.  He appears to have positional vertigo.  I recommended a referral for vestibular physical therapy.  He would like to hold off on this for now.  He plans to continue as needed meclizine.  He would like to delay the start of the next cycle of Xeloda/Avastin until he completes a course of meclizine.  He will return for an office visit with the plan to resume Xeloda/Avastin on 06/17/2022.  [name], MD  06/08/2022  9:07 AM

## 2022-06-09 ENCOUNTER — Other Ambulatory Visit: Payer: Self-pay

## 2022-06-10 DIAGNOSIS — H8113 Benign paroxysmal vertigo, bilateral: Secondary | ICD-10-CM | POA: Diagnosis not present

## 2022-06-10 DIAGNOSIS — R42 Dizziness and giddiness: Secondary | ICD-10-CM | POA: Diagnosis not present

## 2022-06-11 DIAGNOSIS — R42 Dizziness and giddiness: Secondary | ICD-10-CM | POA: Diagnosis not present

## 2022-06-11 DIAGNOSIS — H8113 Benign paroxysmal vertigo, bilateral: Secondary | ICD-10-CM | POA: Diagnosis not present

## 2022-06-12 ENCOUNTER — Other Ambulatory Visit: Payer: Self-pay

## 2022-06-16 ENCOUNTER — Encounter: Payer: Self-pay | Admitting: *Deleted

## 2022-06-16 NOTE — Progress Notes (Signed)
Per Dr. Truett Perna: Does not need to repeat labs to treat on 05/18/22. OK to proceed w/labs from 06/08/22.

## 2022-06-17 ENCOUNTER — Telehealth: Payer: Self-pay

## 2022-06-17 ENCOUNTER — Encounter: Payer: Self-pay | Admitting: Nurse Practitioner

## 2022-06-17 ENCOUNTER — Inpatient Hospital Stay: Payer: Medicare Other | Attending: Oncology

## 2022-06-17 ENCOUNTER — Inpatient Hospital Stay: Payer: Medicare Other | Admitting: Nurse Practitioner

## 2022-06-17 VITALS — BP 147/75 | HR 68 | Resp 18

## 2022-06-17 VITALS — BP 148/65 | HR 65 | Temp 98.2°F | Resp 18 | Ht 68.0 in | Wt 199.6 lb

## 2022-06-17 DIAGNOSIS — C187 Malignant neoplasm of sigmoid colon: Secondary | ICD-10-CM | POA: Diagnosis not present

## 2022-06-17 DIAGNOSIS — Z5112 Encounter for antineoplastic immunotherapy: Secondary | ICD-10-CM | POA: Diagnosis not present

## 2022-06-17 DIAGNOSIS — C7801 Secondary malignant neoplasm of right lung: Secondary | ICD-10-CM | POA: Insufficient documentation

## 2022-06-17 DIAGNOSIS — C7802 Secondary malignant neoplasm of left lung: Secondary | ICD-10-CM | POA: Diagnosis not present

## 2022-06-17 LAB — CEA (ACCESS): CEA (CHCC): 24.64 ng/mL — ABNORMAL HIGH (ref 0.00–5.00)

## 2022-06-17 MED ORDER — HEPARIN SOD (PORK) LOCK FLUSH 100 UNIT/ML IV SOLN
500.0000 [IU] | Freq: Once | INTRAVENOUS | Status: AC | PRN
  Administered 2022-06-17: 500 [IU]

## 2022-06-17 MED ORDER — LIDOCAINE-PRILOCAINE 2.5-2.5 % EX CREA
1.0000 | TOPICAL_CREAM | CUTANEOUS | 2 refills | Status: DC | PRN
Start: 2022-06-17 — End: 2023-03-08

## 2022-06-17 MED ORDER — SODIUM CHLORIDE 0.9 % IV SOLN: Freq: Once | INTRAVENOUS | Status: AC

## 2022-06-17 MED ORDER — SODIUM CHLORIDE 0.9% FLUSH
10.0000 mL | INTRAVENOUS | Status: DC | PRN
  Administered 2022-06-17: 10 mL

## 2022-06-17 MED ORDER — SODIUM CHLORIDE 0.9 % IV SOLN
7.5000 mg/kg | Freq: Once | INTRAVENOUS | Status: AC
  Administered 2022-06-17: 700 mg via INTRAVENOUS
  Filled 2022-06-17: qty 12

## 2022-06-17 NOTE — Progress Notes (Signed)
[facility] OFFICE PROGRESS NOTE   Diagnosis: Colon cancer  INTERVAL HISTORY:   [name] returns as scheduled.  Vertigo is much better.  He has mild intermittent nausea.  No mouth sores.  No diarrhea.  No shortness of breath.  Objective:  Vital signs in last 24 hours:  Blood pressure (!) 148/65, pulse 65, temperature 98.2 F (36.8 C), temperature source Oral, resp. rate 18, height 5\' 8"  (1.727 m), weight 199 lb 9.6 oz (90.5 kg), SpO2 99 %.    HEENT: No thrush or ulcers. Resp: Lungs clear bilaterally. Cardio: Regular rate and rhythm. GI: Abdomen soft and nontender.  No hepatosplenomegaly. Vascular: No leg edema. Neuro: Alert and Skin: Palms without erythema. Port-A-Cath without erythema.  Lab Results:  Lab Results  Component Value Date   WBC 9.5 06/08/2022   HGB 16.1 06/08/2022   HCT 47.0 06/08/2022   MCV 95.3 06/08/2022   PLT 221 06/08/2022   NEUTROABS 6.0 06/08/2022    Imaging:  No results found.  Medications: I have reviewed the patient's current medications.  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), status post a sigmoid colectomy and partial bladder resection 09/19/2020 2 separate sigmoid colon tumors, 2.5 cm apart, 1/13 lymph nodes positive, tumor extends to adherent soft tissue of the bladder, MSS, normal mismatch repair protein expression tumor mutation burden 0, K-ras G12D CT abdomen/pelvis 09/19/2020-sigmoid colon mass with colonic obstruction, 6 mm right lower lobe nodule CT chest 09/22/2020-multiple bilateral lung nodules consistent with metastases Elevated preoperative CEA Cycle 1 FOLFOX 10/28/2020 Cycle 2 FOLFOX 11/11/2020 Cycle 3 FOLFOX 11/25/2020 Cycle 4 FOLFOX 12/09/2020 Cycle 5 FOLFOX 12/23/2020 CT chest 12/25/2020-unchanged lung nodules, 2 cm subpleural nodule in the left posterior chest more prominent-loculated fluid? Cycle 6 FOLFOX 02/03/2021-5-FU dose reduced and bolus eliminated secondary to  diarrhea Cycle 7 FOLFOX 02/17/2021 Cycle 8 FOLFOX 03/03/2021 PET 03/17/2021 pulmonary nodules without significant change, 10 mm nodule in right middle lobe with mild FDG uptake, mildly hypermetabolic pleural nodule in the lower left hemothorax, new clustered nodularity in the apical and posterior right upper lobe-likely infectious CTs 07/23/2021-new left pleural effusion with multiple pleural nodules, increased size of several pulmonary nodules, stable left paracolic gutter nodule, morphologic features suggestive of early cirrhosis Thoracentesis 07/24/2021-1200 cc of fluid removed, rare atypical cells present Thoracentesis 627 2023-1.5 L of fluid removed, atypical cells present, acute inflammation and blood Thoracentesis 08/14/2021- 1.2 L of fluid removed, adenocarcinoma compatible with metastatic colon cancer Thoracentesis 08/21/2021-adenocarcinoma Cycle 1 FOLFIRI/bevacizumab 09/01/2021 Cycle 2 FOLFIRI/bevacizumab 09/15/2021 Cycle 3 FOLFIRI/bevacizumab 09/29/2021-Decadron prophylaxis added for delayed nausea Cycle 4 FOLFIRI/bevacizumab 10/13/2021 Cycle 5 FOLFIRI/bevacizumab 10/27/2021 CT chest 10/28/2021-improvement in left pleural effusion, rule/parenchymal metastases-some are slightly smaller, no new lesions Cycle 6 FOLFIRI/bevacizumab 11/10/2021 Cycle 7 FOLFIRI/bevacizumab 12/01/2021 Cycle 8 FOLFIRI/bevacizumab 12/22/2021 CT chest 01/07/2022-stable pulmonary parenchymal and pleural metastatic disease. Cycle 9 FOLFIRI/bevacizumab 02/09/2022 03/02/2022 treatment discontinued due to poor tolerance Xeloda 7 days on/7 days off beginning 03/08/2022, Avastin continued every 3 weeks CT chest 03/09/2022-no significant change in appearance of bilateral pulmonary metastasis, trace left pleural effusion, nodular liver contour Xeloda 7 days on/7 days off and Avastin every 3 weeks CT chest 06/01/2022-mild enlargement of several pulmonary metastases, trace left pleural effusion Xeloda 7 days on/7 days off and Avastin every 3  weeks 06/17/2022  2.  Port-A-Cath placement-Dr. 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  Vertigo April 2024    Disposition: [name].  He agrees to resume treatment today with Xeloda 7 days on/7 days off and Avastin every 3 weeks.  Labs from 06/08/2022 reviewed, adequate to proceed with treatment.  He will return for lab, follow-up, Avastin in 3 weeks.  He will contact the office in the interim with any problems.    Lonna Cobb ANP/GNP-BC   06/17/2022  9:14 AM

## 2022-06-17 NOTE — Patient Instructions (Signed)
[facility]   Discharge Instructions: Thank you for choosing [facility] to provide your oncology and hematology care.   If you have a lab appointment with the Cancer Center, please go directly to the Cancer Center and check in at the registration area.   Wear comfortable clothing and clothing appropriate for easy access to any Portacath or PICC line.   We strive to give you quality time with your provider. You may need to reschedule your appointment if you arrive late (15 or more minutes).  Arriving late affects you and other patients whose appointments are after yours.  Also, if you miss three or more appointments without notifying the office, you may be dismissed from the clinic at the provider's discretion.      For prescription refill requests, have your pharmacy contact our office and allow 72 hours for refills to be completed.    Today you received the following chemotherapy and/or immunotherapy agents Bevacizumab-bvzr (ZIRABEV).      To help prevent nausea and vomiting after your treatment, we encourage you to take your nausea medication as directed.  BELOW ARE SYMPTOMS THAT SHOULD BE REPORTED IMMEDIATELY: *FEVER GREATER THAN 100.4 F (38 C) OR HIGHER *CHILLS OR SWEATING *NAUSEA AND VOMITING THAT IS NOT CONTROLLED WITH YOUR NAUSEA MEDICATION *UNUSUAL SHORTNESS OF BREATH *UNUSUAL BRUISING OR BLEEDING *URINARY PROBLEMS (pain or burning when urinating, or frequent urination) *BOWEL PROBLEMS (unusual diarrhea, constipation, pain near the anus) TENDERNESS IN MOUTH AND THROAT WITH OR WITHOUT PRESENCE OF ULCERS (sore throat, sores in mouth, or a toothache) UNUSUAL RASH, SWELLING OR PAIN  UNUSUAL VAGINAL DISCHARGE OR ITCHING   Items with * indicate a potential emergency and should be followed up as soon as possible or go to the Emergency Department if any problems should occur.  Please show the CHEMOTHERAPY ALERT CARD or IMMUNOTHERAPY  ALERT CARD at check-in to the Emergency Department and triage nurse.  Should you have questions after your visit or need to cancel or reschedule your appointment, please contact [facility]  Dept: [phone]  and follow the prompts.  Office hours are 8:00 a.m. to 4:30 p.m. Monday - Friday. Please note that voicemails left after 4:00 p.m. may not be returned until the following business day.  We are closed weekends and major holidays. You have access to a nurse at all times for urgent questions. Please call the main number to the clinic Dept: [phone] and follow the prompts.   For any non-urgent questions, you may also contact your provider using MyChart. We now offer e-Visits for anyone 18 and older to request care online for non-urgent symptoms. For details visit [email or web address].   Also download the MyChart app! Go to the app store, search "MyChart", open the app, select [facility], and log in with your MyChart username and password.  Bevacizumab Injection What is this medication? BEVACIZUMAB (be va SIZ yoo mab) treats some types of cancer. It works by blocking a protein that causes cancer cells to grow and multiply. This helps to slow or stop the spread of cancer cells. It is a monoclonal antibody. This medicine may be used for other purposes; ask your health care provider or pharmacist if you have questions. COMMON BRAND NAME(S): Alymsys, Avastin, MVASI, Zirabev What should I tell my care team before I take this medication? They need to know if you have any of these conditions: Blood clots Coughing up blood Having or recent surgery Heart   failure High blood pressure History of a connection between 2 or more body parts that do not usually connect (fistula) History of a tear in your stomach or intestines Protein in your urine An unusual or allergic reaction to bevacizumab, other medications, foods, dyes, or preservatives Pregnant or trying to  get pregnant Breast-feeding How should I use this medication? This medication is injected into a vein. It is given by your care team in a hospital or clinic setting. Talk to your care team the use of this medication in children. Special care may be needed. Overdosage: If you think you have taken too much of this medicine contact a poison control center or emergency room at once. NOTE: This medicine is only for you. Do not share this medicine with others. What if I miss a dose? Keep appointments for follow-up doses. It is important not to miss your dose. Call your care team if you are unable to keep an appointment. What may interact with this medication? Interactions are not expected. This list may not describe all possible interactions. Give your health care provider a list of all the medicines, herbs, non-prescription drugs, or dietary supplements you use. Also tell them if you smoke, drink alcohol, or use illegal drugs. Some items may interact with your medicine. What should I watch for while using this medication? Your condition will be monitored carefully while you are receiving this medication. You may need blood work while taking this medication. This medication may make you feel generally unwell. This is not uncommon as chemotherapy can affect healthy cells as well as cancer cells. Report any side effects. Continue your course of treatment even though you feel ill unless your care team tells you to stop. This medication may increase your risk to bruise or bleed. Call your care team if you notice any unusual bleeding. Before having surgery, talk to your care team to make sure it is ok. This medication can increase the risk of poor healing of your surgical site or wound. You will need to stop this medication for 28 days before surgery. After surgery, wait at least 28 days before restarting this medication. Make sure the surgical site or wound is healed enough before restarting this medication.  Talk to your care team if questions. Talk to your care team if you may be pregnant. Serious birth defects can occur if you take this medication during pregnancy and for 6 months after the last dose. Contraception is recommended while taking this medication and for 6 months after the last dose. Your care team can help you find the option that works for you. Do not breastfeed while taking this medication and for 6 months after the last dose. This medication can cause infertility. Talk to your care team if you are concerned about your fertility. What side effects may I notice from receiving this medication? Side effects that you should report to your care team as soon as possible: Allergic reactions--skin rash, itching, hives, swelling of the face, lips, tongue, or throat Bleeding--bloody or black, tar-like stools, vomiting blood or brown material that looks like coffee grounds, red or dark brown urine, small red or purple spots on skin, unusual bruising or bleeding Blood clot--pain, swelling, or warmth in the leg, shortness of breath, chest pain Heart attack--pain or tightness in the chest, shoulders, arms, or jaw, nausea, shortness of breath, cold or clammy skin, feeling faint or lightheaded Heart failure--shortness of breath, swelling of the ankles, feet, or hands, sudden weight gain, unusual weakness or   fatigue Increase in blood pressure Infection--fever, chills, cough, sore throat, wounds that don't heal, pain or trouble when passing urine, general feeling of discomfort or being unwell Infusion reactions--chest pain, shortness of breath or trouble breathing, feeling faint or lightheaded Kidney injury--decrease in the amount of urine, swelling of the ankles, hands, or feet Stomach pain that is severe, does not go away, or gets worse Stroke--sudden numbness or weakness of the face, arm, or leg, trouble speaking, confusion, trouble walking, loss of balance or coordination, dizziness, severe headache,  change in vision Sudden and severe headache, confusion, change in vision, seizures, which may be signs of posterior reversible encephalopathy syndrome (PRES) Side effects that usually do not require medical attention (report to your care team if they continue or are bothersome): Back pain Change in taste Diarrhea Dry skin Increased tears Nosebleed This list may not describe all possible side effects. Call your doctor for medical advice about side effects. You may report side effects to FDA at 1-800-FDA-1088. Where should I keep my medication? This medication is given in a hospital or clinic. It will not be stored at home. NOTE: This sheet is a summary. It may not cover all possible information. If you have questions about this medicine, talk to your doctor, pharmacist, or health care provider.  2023 Elsevier/Gold Standard (2021-05-29 00:00:00)  

## 2022-06-17 NOTE — Telephone Encounter (Signed)
Patient gave verbal understanding and and no further questions or concerns.

## 2022-06-17 NOTE — Progress Notes (Signed)
Patient seen by Lisa Thomas NP today  Vitals are within treatment parameters.  Labs reviewed by Lisa Thomas NP and are within treatment parameters.  Per physician team, patient is ready for treatment and there are NO modifications to the treatment plan.     

## 2022-06-17 NOTE — Telephone Encounter (Signed)
-----   Message from Rana Snare, NP sent at 06/17/2022  1:03 PM EDT ----- Please let him know CEA is stable.  Follow-up as scheduled.

## 2022-06-18 ENCOUNTER — Other Ambulatory Visit: Payer: Self-pay

## 2022-06-20 ENCOUNTER — Other Ambulatory Visit: Payer: Self-pay

## 2022-06-22 DIAGNOSIS — H8113 Benign paroxysmal vertigo, bilateral: Secondary | ICD-10-CM | POA: Diagnosis not present

## 2022-06-22 DIAGNOSIS — R42 Dizziness and giddiness: Secondary | ICD-10-CM | POA: Diagnosis not present

## 2022-06-25 DIAGNOSIS — H8113 Benign paroxysmal vertigo, bilateral: Secondary | ICD-10-CM | POA: Diagnosis not present

## 2022-06-25 DIAGNOSIS — R42 Dizziness and giddiness: Secondary | ICD-10-CM | POA: Diagnosis not present

## 2022-06-29 ENCOUNTER — Encounter (HOSPITAL_BASED_OUTPATIENT_CLINIC_OR_DEPARTMENT_OTHER): Payer: Self-pay

## 2022-06-29 ENCOUNTER — Other Ambulatory Visit: Payer: Self-pay

## 2022-06-29 ENCOUNTER — Emergency Department (HOSPITAL_BASED_OUTPATIENT_CLINIC_OR_DEPARTMENT_OTHER): Payer: Medicare Other

## 2022-06-29 ENCOUNTER — Emergency Department (HOSPITAL_BASED_OUTPATIENT_CLINIC_OR_DEPARTMENT_OTHER)
Admission: EM | Admit: 2022-06-29 | Discharge: 2022-06-29 | Disposition: A | Discharge status: Home / Self Care | Payer: Medicare Other | Attending: Emergency Medicine | Admitting: Emergency Medicine

## 2022-06-29 DIAGNOSIS — S0990XA Unspecified injury of head, initial encounter: Secondary | ICD-10-CM

## 2022-06-29 DIAGNOSIS — W01198A Fall on same level from slipping, tripping and stumbling with subsequent striking against other object, initial encounter: Secondary | ICD-10-CM | POA: Diagnosis not present

## 2022-06-29 DIAGNOSIS — S50311A Abrasion of right elbow, initial encounter: Secondary | ICD-10-CM | POA: Insufficient documentation

## 2022-06-29 DIAGNOSIS — R42 Dizziness and giddiness: Secondary | ICD-10-CM

## 2022-06-29 DIAGNOSIS — S0001XA Abrasion of scalp, initial encounter: Secondary | ICD-10-CM

## 2022-06-29 DIAGNOSIS — W19XXXA Unspecified fall, initial encounter: Secondary | ICD-10-CM

## 2022-06-29 LAB — CBC WITH DIFFERENTIAL/PLATELET
Abs Immature Granulocytes: 0.03 10*3/uL (ref 0.00–0.07)
Basophils Absolute: 0.1 10*3/uL (ref 0.0–0.1)
Basophils Relative: 1 %
Eosinophils Absolute: 0.1 10*3/uL (ref 0.0–0.5)
Eosinophils Relative: 1 %
HCT: 48.1 % (ref 39.0–52.0)
Hemoglobin: 16.3 g/dL (ref 13.0–17.0)
Immature Granulocytes: 0 %
Lymphocytes Relative: 19 %
Lymphs Abs: 1.9 10*3/uL (ref 0.7–4.0)
MCH: 32.4 pg (ref 26.0–34.0)
MCHC: 33.9 g/dL (ref 30.0–36.0)
MCV: 95.6 fL (ref 80.0–100.0)
Monocytes Absolute: 0.8 10*3/uL (ref 0.1–1.0)
Monocytes Relative: 9 %
Neutro Abs: 6.9 10*3/uL (ref 1.7–7.7)
Neutrophils Relative %: 70 %
Platelets: 256 10*3/uL (ref 150–400)
RBC: 5.03 MIL/uL (ref 4.22–5.81)
RDW: 15.9 % — ABNORMAL HIGH (ref 11.5–15.5)
WBC: 9.8 10*3/uL (ref 4.0–10.5)
nRBC: 0 % (ref 0.0–0.2)

## 2022-06-29 LAB — BASIC METABOLIC PANEL
Anion gap: 8 (ref 5–15)
BUN: 15 mg/dL (ref 8–23)
CO2: 28 mmol/L (ref 22–32)
Calcium: 9.1 mg/dL (ref 8.9–10.3)
Chloride: 98 mmol/L (ref 98–111)
Creatinine, Ser: 0.86 mg/dL (ref 0.61–1.24)
GFR, Estimated: >60 mL/min (ref >60)
Glucose, Bld: 102 mg/dL — ABNORMAL HIGH (ref 70–99)
Potassium: 4.6 mmol/L (ref 3.5–5.1)
Sodium: 134 mmol/L — ABNORMAL LOW (ref 135–145)

## 2022-06-29 NOTE — Discharge Instructions (Signed)
You are seen in the emergency department for evaluation of fall head injury yesterday.  You had a CAT scan of your head that did not show any bleeding or fracture.  Your dizziness vertigo is likely due to an inner ear cause but there are other causes.  Please talk to your primary care doctor about considering an MRI of your brain.  Return to the emergency department if any worsening or concerning symptoms.

## 2022-06-29 NOTE — ED Triage Notes (Signed)
Pt states that he fell last night and hit his head. Denies being on blood thinner. Pt States that he didn't loose consciousness. Wanted to come be checked out this morning to make sure nothing is bleeding inside. Hx of colon and lung cancer.

## 2022-06-29 NOTE — ED Provider Notes (Signed)
[facility] Provider Note   CSN: [id number] Arrival date & time: 06/29/22  4098     History {Add pertinent medical, surgical, social history, OB history to HPI:1} Chief Complaint  Patient presents with   Craig [facility] is a 82 y.o. male.  He has a history of colon cancer and is under active chemotherapy with Dr. Truett Perna.  He said he has been troubled by vertigo unsteady gait imbalance dizziness for about 2 months.  He has been using meclizine without significant improvement.  He had another dizzy spinning spell yesterday evening while walking and fell hitting his head.  No loss of consciousness.  Not on blood thinners.  Here for evaluation of his head injury.  As far as the vertigo goes he said he has had it intermittently in the past but is never lasted this long.  He has gone to vestibular rehab recently.  It does not feel any worse today.  No numbness or weakness no blurry vision or double vision.  The history is provided by the patient and the spouse.  Fall This is a new problem. The current episode started yesterday. The problem has not changed since onset.Associated symptoms include headaches. Pertinent negatives include no chest pain, no abdominal pain and no shortness of breath. Nothing aggravates the symptoms. Nothing relieves the symptoms. He has tried rest for the symptoms. The treatment provided mild relief.       Home Medications Prior to Admission medications   Medication Sig Start Date End Date Taking? Authorizing Provider  acetaminophen (TYLENOL) 500 MG tablet Take 2 tablets (1,000 mg total) by mouth every 6 (six) hours. 09/23/20   Joseph Art, DO  capecitabine (XELODA) 500 MG tablet Take 3 tablets (1,500 mg total) by mouth 2 (two) times daily with a meal. Take for 7 days, then off for 7 days. Repeat every 14 days. 05/28/22   Ladene Artist, MD  ibuprofen (ADVIL) 600 MG tablet Take 600 mg by mouth every 6 (six)  hours as needed. Patient not taking: Reported on 01/12/2022    [provider]  lidocaine-prilocaine (EMLA) cream Apply 1 Application topically as needed. 06/17/22   Rana Snare, NP  loratadine (CLARITIN) 10 MG tablet Take 10 mg by mouth daily.    [provider]  meclizine (ANTIVERT) 25 MG tablet Take 25 mg by mouth 3 (three) times daily as needed.    [provider]  neomycin-polymyxin-hydrocortisone (CORTISPORIN) 3.5-10000-1 OTIC suspension SMARTSIG:In Ear(s) Patient not taking: Reported on 06/08/2022 05/14/22   [provider]  ondansetron (ZOFRAN) 8 MG tablet Take 1 tablet (8 mg total) by mouth every 8 (eight) hours as needed for nausea or vomiting. Patient not taking: Reported on 06/08/2022 05/18/22   Rana Snare, NP  oxyCODONE (OXY IR/ROXICODONE) 5 MG immediate release tablet Take 1 tablet (5 mg total) by mouth every 6 (six) hours as needed for moderate pain. Patient not taking: Reported on 10/28/2020 10/07/20   Abigail Miyamoto, MD  prochlorperazine (COMPAZINE) 10 MG tablet Take 1 tablet (10 mg total) by mouth every 6 (six) hours as needed. 05/18/22   Rana Snare, NP      Allergies    Patient has no known allergies.    Review of Systems   Review of Systems  Constitutional:  Negative for fever.  Eyes:  Negative for visual disturbance.  Respiratory:  Negative for shortness of breath.   Cardiovascular:  Negative for chest  pain.  Gastrointestinal:  Negative for abdominal pain.  Musculoskeletal:  Negative for neck pain.  Skin:  Positive for wound.  Neurological:  Positive for dizziness and headaches.    Physical Exam Updated Vital Signs BP (!) 165/75 (BP Location: Right Arm)   Pulse 75   Temp 98.4 F (36.9 C) (Oral)   Resp 19   Ht 5\' 8"  (1.727 m)   Wt 90.7 kg   SpO2 100%   BMI 30.41 kg/m  Physical Exam Vitals and nursing note reviewed.  Constitutional:      General: He is not in acute distress.    Appearance: Normal appearance. He is  well-developed.  HENT:     Head: Normocephalic.     Comments: He has some abrasions on the crown of his head.  No gross foreign bodies.  No active bleeding. Eyes:     Conjunctiva/sclera: Conjunctivae normal.  Cardiovascular:     Rate and Rhythm: Normal rate and regular rhythm.     Heart sounds: No murmur heard. Pulmonary:     Effort: Pulmonary effort is normal. No respiratory distress.     Breath sounds: Normal breath sounds.  Abdominal:     Palpations: Abdomen is soft.     Tenderness: There is no abdominal tenderness. There is no guarding or rebound.  Musculoskeletal:        General: No deformity. Normal range of motion.     Cervical back: Neck supple.     Comments: He has some abrasions on his right elbow full range of motion without any pain or limitations  Skin:    General: Skin is warm and dry.     Capillary Refill: Capillary refill takes less than 2 seconds.  Neurological:     General: No focal deficit present.     Mental Status: He is alert and oriented to person, place, and time.     Cranial Nerves: No cranial nerve deficit.     Sensory: No sensory deficit.     Motor: No weakness.     ED Results / Procedures / Treatments   Labs (all labs ordered are listed, but only abnormal results are displayed) Labs Reviewed - No data to display  EKG None  Radiology No results found.  Procedures Procedures  {Document cardiac monitor, telemetry assessment procedure when appropriate:1}  Medications Ordered in ED Medications - No data to display  ED Course/ Medical Decision Making/ A&P   {   Click here for ABCD2, HEART and other calculatorsREFRESH Note before signing :1}                          Medical Decision Making  This patient complains of ***; this involves an extensive number of treatment Options and is a complaint that carries with it a high risk of complications and morbidity. The differential includes ***  I ordered, reviewed and interpreted labs, which  included *** I ordered medication *** and reviewed PMP when indicated. I ordered imaging studies which included *** and I independently    visualized and interpreted imaging which showed *** Additional history obtained from *** Previous records obtained and reviewed *** I consulted *** and discussed lab and imaging findings and discussed disposition.  Cardiac monitoring reviewed, *** Social determinants considered, *** Critical Interventions: ***  After the interventions stated above, I reevaluated the patient and found *** Admission and further testing considered, ***   {Document critical care time when appropriate:1} {Document review of labs and  clinical decision tools ie heart score, Chads2Vasc2 etc:1}  {Document your independent review of radiology images, and any outside records:1} {Document your discussion with family members, caretakers, and with consultants:1} {Document social determinants of health affecting pt's care:1} {Document your decision making why or why not admission, treatments were needed:1} Final Clinical Impression(s) / ED Diagnoses Final diagnoses:  None    Rx / DC Orders ED Discharge Orders     None

## 2022-06-30 ENCOUNTER — Other Ambulatory Visit: Payer: Self-pay

## 2022-07-01 ENCOUNTER — Other Ambulatory Visit: Payer: Self-pay

## 2022-07-02 ENCOUNTER — Other Ambulatory Visit: Payer: Self-pay

## 2022-07-07 ENCOUNTER — Other Ambulatory Visit: Payer: Self-pay

## 2022-07-07 ENCOUNTER — Other Ambulatory Visit (HOSPITAL_COMMUNITY): Payer: Self-pay

## 2022-07-07 ENCOUNTER — Other Ambulatory Visit: Payer: Self-pay | Admitting: Oncology

## 2022-07-07 DIAGNOSIS — C187 Malignant neoplasm of sigmoid colon: Secondary | ICD-10-CM

## 2022-07-07 MED ORDER — CAPECITABINE 500 MG PO TABS
1500.0000 mg | ORAL_TABLET | Freq: Two times a day (BID) | ORAL | 0 refills | Status: DC
Start: 2022-07-07 — End: 2022-08-06
  Filled 2022-07-07: qty 84, 28d supply, fill #0

## 2022-07-08 ENCOUNTER — Other Ambulatory Visit: Payer: Self-pay

## 2022-07-09 ENCOUNTER — Telehealth: Payer: Self-pay | Admitting: Nurse Practitioner

## 2022-07-09 NOTE — Telephone Encounter (Signed)
Spoke with patient wife confirming upcoming appointments.patient request to puch appointments out a week

## 2022-07-10 ENCOUNTER — Other Ambulatory Visit: Payer: Self-pay

## 2022-07-12 ENCOUNTER — Other Ambulatory Visit (HOSPITAL_COMMUNITY): Payer: Self-pay

## 2022-07-13 ENCOUNTER — Inpatient Hospital Stay: Payer: Medicare Other

## 2022-07-13 ENCOUNTER — Inpatient Hospital Stay: Payer: Medicare Other | Admitting: Nurse Practitioner

## 2022-07-20 ENCOUNTER — Ambulatory Visit (HOSPITAL_COMMUNITY)
Admission: RE | Admit: 2022-07-20 | Discharge: 2022-07-20 | Disposition: A | Discharge status: Home / Self Care | Payer: Medicare Other | Source: Ambulatory Visit | Attending: Nurse Practitioner | Admitting: Nurse Practitioner

## 2022-07-20 ENCOUNTER — Inpatient Hospital Stay: Payer: Medicare Other

## 2022-07-20 ENCOUNTER — Encounter: Payer: Self-pay | Admitting: Nurse Practitioner

## 2022-07-20 ENCOUNTER — Inpatient Hospital Stay: Payer: Medicare Other | Attending: Oncology

## 2022-07-20 ENCOUNTER — Inpatient Hospital Stay: Payer: Medicare Other | Admitting: Nurse Practitioner

## 2022-07-20 VITALS — BP 148/82 | HR 75 | Temp 98.2°F | Resp 18 | Ht 68.0 in | Wt 202.4 lb

## 2022-07-20 DIAGNOSIS — C187 Malignant neoplasm of sigmoid colon: Secondary | ICD-10-CM | POA: Diagnosis not present

## 2022-07-20 DIAGNOSIS — R519 Headache, unspecified: Secondary | ICD-10-CM | POA: Diagnosis not present

## 2022-07-20 DIAGNOSIS — C7802 Secondary malignant neoplasm of left lung: Secondary | ICD-10-CM | POA: Insufficient documentation

## 2022-07-20 DIAGNOSIS — C7801 Secondary malignant neoplasm of right lung: Secondary | ICD-10-CM | POA: Insufficient documentation

## 2022-07-20 DIAGNOSIS — R42 Dizziness and giddiness: Secondary | ICD-10-CM | POA: Diagnosis not present

## 2022-07-20 DIAGNOSIS — Z95828 Presence of other vascular implants and grafts: Secondary | ICD-10-CM

## 2022-07-20 LAB — CMP (CANCER CENTER ONLY)
ALT: 15 U/L (ref 0–44)
AST: 16 U/L (ref 15–41)
Albumin: 4.1 g/dL (ref 3.5–5.0)
Alkaline Phosphatase: 58 U/L (ref 38–126)
Anion gap: 6 (ref 5–15)
BUN: 18 mg/dL (ref 8–23)
CO2: 30 mmol/L (ref 22–32)
Calcium: 9.3 mg/dL (ref 8.9–10.3)
Chloride: 101 mmol/L (ref 98–111)
Creatinine: 0.82 mg/dL (ref 0.61–1.24)
GFR, Estimated: >60 mL/min (ref >60)
Glucose, Bld: 102 mg/dL — ABNORMAL HIGH (ref 70–99)
Potassium: 3.8 mmol/L (ref 3.5–5.1)
Sodium: 137 mmol/L (ref 135–145)
Total Bilirubin: 0.4 mg/dL (ref 0.3–1.2)
Total Protein: 7.2 g/dL (ref 6.5–8.1)

## 2022-07-20 LAB — CBC WITH DIFFERENTIAL (CANCER CENTER ONLY)
Abs Immature Granulocytes: 0.03 10*3/uL (ref 0.00–0.07)
Basophils Absolute: 0.1 10*3/uL (ref 0.0–0.1)
Basophils Relative: 1 %
Eosinophils Absolute: 0.4 10*3/uL (ref 0.0–0.5)
Eosinophils Relative: 5 %
HCT: 45.3 % (ref 39.0–52.0)
Hemoglobin: 15.9 g/dL (ref 13.0–17.0)
Immature Granulocytes: 0 %
Lymphocytes Relative: 23 %
Lymphs Abs: 2.1 10*3/uL (ref 0.7–4.0)
MCH: 33.2 pg (ref 26.0–34.0)
MCHC: 35.1 g/dL (ref 30.0–36.0)
MCV: 94.6 fL (ref 80.0–100.0)
Monocytes Absolute: 0.7 10*3/uL (ref 0.1–1.0)
Monocytes Relative: 8 %
Neutro Abs: 5.7 10*3/uL (ref 1.7–7.7)
Neutrophils Relative %: 63 %
Platelet Count: 280 10*3/uL (ref 150–400)
RBC: 4.79 MIL/uL (ref 4.22–5.81)
RDW: 14.9 % (ref 11.5–15.5)
WBC Count: 9.1 10*3/uL (ref 4.0–10.5)
nRBC: 0 % (ref 0.0–0.2)

## 2022-07-20 LAB — CEA (ACCESS): CEA (CHCC): 24.38 ng/mL — ABNORMAL HIGH (ref 0.00–5.00)

## 2022-07-20 LAB — TOTAL PROTEIN, URINE DIPSTICK: Protein, ur: NEGATIVE mg/dL

## 2022-07-20 MED ORDER — HEPARIN SOD (PORK) LOCK FLUSH 100 UNIT/ML IV SOLN
500.0000 [IU] | Freq: Once | INTRAVENOUS | Status: AC
  Administered 2022-07-20: 500 [IU] via INTRAVENOUS

## 2022-07-20 MED ORDER — GADOBUTROL 1 MMOL/ML IV SOLN
9.0000 mL | Freq: Once | INTRAVENOUS | Status: AC | PRN
  Administered 2022-07-20: 9 mL via INTRAVENOUS

## 2022-07-20 MED ORDER — SODIUM CHLORIDE 0.9% FLUSH
10.0000 mL | INTRAVENOUS | Status: AC | PRN
  Administered 2022-07-20: 10 mL via INTRAVENOUS

## 2022-07-20 NOTE — Progress Notes (Signed)
[facility] OFFICE PROGRESS NOTE   Diagnosis: Craig Best  INTERVAL HISTORY:   Craig Best returns for follow-up.  He was seen in the emergency department 06/29/2022 after a fall.  Head CT showed no acute findings.  Chronic small vessel ischemic disease noted.  CBC and basic metabolic panel unremarkable.  The dizziness is better.  He continues to feel that something is "wrong" with his head.  His head feels "tight".  He has consistent nausea.  No vomiting.  His wife notes that he "shuffles" when he walks.  No visual disturbance.  No confusion.  No seizure activity.  No mouth sores.  No diarrhea except after taking a laxative.  No hand or foot pain or redness.  He notes lower extremity edema which worsens throughout the course of the day, no edema when he wakes up in the morning.  Objective:  Vital signs in last 24 hours:  Blood pressure (!) 148/82, pulse 75, temperature 98.2 F (36.8 C), temperature source Oral, resp. rate 18, height 5\' 8"  (1.727 m), weight 202 lb 6.4 oz (91.8 kg), SpO2 98 %.    HEENT: Pupils equal round and reactive to light.  Extraocular movements intact.  Sclera anicteric.  No thrush or ulcers. Resp: Lungs clear bilaterally. Cardio: Regular rate and rhythm. GI: Abdomen soft and nontender.  No hepatosplenomegaly.  Left lower quadrant colostomy. Vascular: No leg edema. Neuro: Alert and oriented.  Follows commands.  Motor strength 5/5.  Finger-to-nose and rapid alternating movement intact.  Knee DTRs 2+, symmetric. Skin: Palms without erythema. Port-A-Cath without erythema.  Lab Results:  Lab Results  Component Value Date   WBC 9.1 07/20/2022   HGB 15.9 07/20/2022   HCT 45.3 07/20/2022   MCV 94.6 07/20/2022   PLT 280 07/20/2022   NEUTROABS 5.7 07/20/2022    Imaging:  No results found.  Medications: I have reviewed the patient's current medications.  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), status post a Craig  colectomy and partial bladder resection 09/19/2020 2 separate Craig Craig tumors, 2.5 cm apart, 1/13 lymph nodes positive, tumor extends to adherent soft tissue of the bladder, MSS, normal mismatch repair protein expression tumor mutation burden 0, K-ras G12D CT abdomen/pelvis 09/19/2020-Craig Craig mass with colonic obstruction, 6 mm right lower lobe nodule CT chest 09/22/2020-multiple bilateral lung nodules consistent with metastases Elevated preoperative CEA Cycle 1 FOLFOX 10/28/2020 Cycle 2 FOLFOX 11/11/2020 Cycle 3 FOLFOX 11/25/2020 Cycle 4 FOLFOX 12/09/2020 Cycle 5 FOLFOX 12/23/2020 CT chest 12/25/2020-unchanged lung nodules, 2 cm subpleural nodule in the left posterior chest more prominent-loculated fluid? Cycle 6 FOLFOX 02/03/2021-5-FU dose reduced and bolus eliminated secondary to diarrhea Cycle 7 FOLFOX 02/17/2021 Cycle 8 FOLFOX 03/03/2021 PET 03/17/2021 pulmonary nodules without significant change, 10 mm nodule in right middle lobe with mild FDG uptake, mildly hypermetabolic pleural nodule in the lower left hemothorax, new clustered nodularity in the apical and posterior right upper lobe-likely infectious CTs 07/23/2021-new left pleural effusion with multiple pleural nodules, increased size of several pulmonary nodules, stable left paracolic gutter nodule, morphologic features suggestive of early cirrhosis Thoracentesis 07/24/2021-1200 cc of fluid removed, rare atypical cells present Thoracentesis 627 2023-1.5 L of fluid removed, atypical cells present, acute inflammation and blood Thoracentesis 08/14/2021- 1.2 L of fluid removed, adenocarcinoma compatible with metastatic Craig Best Thoracentesis 08/21/2021-adenocarcinoma Cycle 1 FOLFIRI/bevacizumab 09/01/2021 Cycle 2 FOLFIRI/bevacizumab 09/15/2021 Cycle 3 FOLFIRI/bevacizumab 09/29/2021-Decadron prophylaxis added for delayed nausea Cycle 4 FOLFIRI/bevacizumab 10/13/2021 Cycle 5 FOLFIRI/bevacizumab 10/27/2021 CT chest 10/28/2021-improvement in left  pleural effusion, rule/parenchymal  metastases-some are slightly smaller, no new lesions Cycle 6 FOLFIRI/bevacizumab 11/10/2021 Cycle 7 FOLFIRI/bevacizumab 12/01/2021 Cycle 8 FOLFIRI/bevacizumab 12/22/2021 CT chest 01/07/2022-stable pulmonary parenchymal and pleural metastatic disease. Cycle 9 FOLFIRI/bevacizumab 02/09/2022 03/02/2022 treatment discontinued due to poor tolerance Xeloda 7 days on/7 days off beginning 03/08/2022, Avastin continued every 3 weeks CT chest 03/09/2022-no significant change in appearance of bilateral pulmonary metastasis, trace left pleural effusion, nodular liver contour Xeloda 7 days on/7 days off and Avastin every 3 weeks CT chest 06/01/2022-mild enlargement of several pulmonary metastases, trace left pleural effusion Xeloda 7 days on/7 days off and Avastin every 3 weeks 06/17/2022  2.  Port-A-Cath placement-Dr. 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  He is on active treatment with Xeloda and Avastin.  He is experiencing intermittent dizziness, headache, nausea.  He had a recent noncontrast brain CT with no acute findings.  We are referring him for a brain MRI.  Treatment will be held today.  He will return for a follow-up visit 07/23/2022.  Patient seen with Dr. Truett Perna.    Lonna Cobb ANP/GNP-BC   07/20/2022  9:01 AM  This was a shared visit with Lonna Cobb.  Craig Best was interviewed and examined.  He has developed progressive dizziness and headache.  We discussed the potential for toxicity related to Xeloda, bevacizumab, CVA, and metastatic disease.  He will be referred for a brain MRI.  Treatment was held today.  I was present for greater than 50% of today's visit.  I performed medical decision making.  Mancel Bale, MD

## 2022-07-20 NOTE — Addendum Note (Signed)
Addended by: Dimitri Ped on: 07/20/2022 09:25 AM   Modules accepted: Orders

## 2022-07-20 NOTE — Progress Notes (Signed)
Patient seen by Lisa Thomas NP today  Vitals are within treatment parameters.  Labs reviewed by Lisa Thomas NP and are within treatment parameters.  Per physician team, patient will not be receiving treatment today.  

## 2022-07-21 ENCOUNTER — Other Ambulatory Visit: Payer: Self-pay

## 2022-07-23 ENCOUNTER — Inpatient Hospital Stay: Payer: Medicare Other | Admitting: Nurse Practitioner

## 2022-07-23 ENCOUNTER — Encounter: Payer: Self-pay | Admitting: Nurse Practitioner

## 2022-07-23 VITALS — BP 160/63 | HR 65 | Temp 98.2°F | Resp 18 | Ht 68.0 in | Wt 202.9 lb

## 2022-07-23 DIAGNOSIS — C187 Malignant neoplasm of sigmoid colon: Secondary | ICD-10-CM | POA: Diagnosis not present

## 2022-07-23 DIAGNOSIS — C7802 Secondary malignant neoplasm of left lung: Secondary | ICD-10-CM | POA: Diagnosis not present

## 2022-07-23 DIAGNOSIS — C7801 Secondary malignant neoplasm of right lung: Secondary | ICD-10-CM | POA: Diagnosis not present

## 2022-07-23 MED ORDER — PANTOPRAZOLE SODIUM 20 MG PO TBEC
20.0000 mg | DELAYED_RELEASE_TABLET | Freq: Every day | ORAL | 1 refills | Status: DC
Start: 2022-07-23 — End: 2022-08-25

## 2022-07-23 NOTE — Progress Notes (Signed)
[facility] OFFICE PROGRESS NOTE   Diagnosis: Colon cancer  INTERVAL HISTORY:   Craig Best returns as scheduled.  Head continues to feel tight on an intermittent basis.  No further dizziness.  He notes that the head tightness improves after belching.  He has intermittent nausea.  Colostomy functioning normally.  He takes MiraLAX.  Objective:  Vital signs in last 24 hours:  Blood pressure (!) 160/63, pulse 65, temperature 98.2 F (36.8 C), temperature source Oral, resp. rate 18, height 5\' 8"  (1.727 m), weight 202 lb 14.4 oz (92 kg), SpO2 98 %.    HEENT: No thrush or ulcers. Resp: Lungs clear bilaterally. Cardio: Regular rate and rhythm. GI: No hepatosplenomegaly.  Left lower quadrant colostomy.  Soft brown stool in the collection bag. Vascular: No leg edema. Neuro: Alert and oriented. Port-A-Cath without erythema.  Lab Results:  Lab Results  Component Value Date   WBC 9.1 07/20/2022   HGB 15.9 07/20/2022   HCT 45.3 07/20/2022   MCV 94.6 07/20/2022   PLT 280 07/20/2022   NEUTROABS 5.7 07/20/2022    Imaging:  No results found.  Medications: I have reviewed the patient's current medications.  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), status post a sigmoid colectomy and partial bladder resection 09/19/2020 2 separate sigmoid colon tumors, 2.5 cm apart, 1/13 lymph nodes positive, tumor extends to adherent soft tissue of the bladder, MSS, normal mismatch repair protein expression tumor mutation burden 0, K-ras G12D CT abdomen/pelvis 09/19/2020-sigmoid colon mass with colonic obstruction, 6 mm right lower lobe nodule CT chest 09/22/2020-multiple bilateral lung nodules consistent with metastases Elevated preoperative CEA Cycle 1 FOLFOX 10/28/2020 Cycle 2 FOLFOX 11/11/2020 Cycle 3 FOLFOX 11/25/2020 Cycle 4 FOLFOX 12/09/2020 Cycle 5 FOLFOX 12/23/2020 CT chest 12/25/2020-unchanged lung nodules, 2 cm subpleural nodule in the left posterior  chest more prominent-loculated fluid? Cycle 6 FOLFOX 02/03/2021-5-FU dose reduced and bolus eliminated secondary to diarrhea Cycle 7 FOLFOX 02/17/2021 Cycle 8 FOLFOX 03/03/2021 PET 03/17/2021 pulmonary nodules without significant change, 10 mm nodule in right middle lobe with mild FDG uptake, mildly hypermetabolic pleural nodule in the lower left hemothorax, new clustered nodularity in the apical and posterior right upper lobe-likely infectious CTs 07/23/2021-new left pleural effusion with multiple pleural nodules, increased size of several pulmonary nodules, stable left paracolic gutter nodule, morphologic features suggestive of early cirrhosis Thoracentesis 07/24/2021-1200 cc of fluid removed, rare atypical cells present Thoracentesis 627 2023-1.5 L of fluid removed, atypical cells present, acute inflammation and blood Thoracentesis 08/14/2021- 1.2 L of fluid removed, adenocarcinoma compatible with metastatic colon cancer Thoracentesis 08/21/2021-adenocarcinoma Cycle 1 FOLFIRI/bevacizumab 09/01/2021 Cycle 2 FOLFIRI/bevacizumab 09/15/2021 Cycle 3 FOLFIRI/bevacizumab 09/29/2021-Decadron prophylaxis added for delayed nausea Cycle 4 FOLFIRI/bevacizumab 10/13/2021 Cycle 5 FOLFIRI/bevacizumab 10/27/2021 CT chest 10/28/2021-improvement in left pleural effusion, rule/parenchymal metastases-some are slightly smaller, no new lesions Cycle 6 FOLFIRI/bevacizumab 11/10/2021 Cycle 7 FOLFIRI/bevacizumab 12/01/2021 Cycle 8 FOLFIRI/bevacizumab 12/22/2021 CT chest 01/07/2022-stable pulmonary parenchymal and pleural metastatic disease. Cycle 9 FOLFIRI/bevacizumab 02/09/2022 03/02/2022 treatment discontinued due to poor tolerance Xeloda 7 days on/7 days off beginning 03/08/2022, Avastin continued every 3 weeks CT chest 03/09/2022-no significant change in appearance of bilateral pulmonary metastasis, trace left pleural effusion, nodular liver contour Xeloda 7 days on/7 days off and Avastin every 3 weeks CT chest 06/01/2022-mild  enlargement of several pulmonary metastases, trace left pleural effusion Xeloda 7 days on/7 days off and Avastin every 3 weeks 06/17/2022  2.  Port-A-Cath placement-Dr. 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  Vertigo April 2024 5.  MRI brain 07/20/2022-no acute or reversible finding.  No evidence of metastatic disease.  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  Disposition: Craig Best appears unchanged.  He continues to have intermittent "head tightness".  Brain MRI was unremarkable.  At today's visit he reports belching multiple times will relieve his symptoms.  He will begin a trial of Protonix 20 mg daily.  He does not want to resume treatment until current symptoms are relieved.  He will contact us next week with an update.  He will return for lab, follow-up, possible Avastin in 2 weeks.  We are available to see him sooner if needed.  Patient seen with Dr. Truett Perna.    Lonna Cobb ANP/GNP-BC   07/23/2022  8:21 AM This was a shared visit with Lonna Cobb.  We reviewed the MRI findings with Craig Best.  There is no MRI explanation for his symptoms.  His symptoms improved with belching today.  He will begin a trial of pantoprazole.  I was present for greater than 50% of today's visit.  I performed medical decision making.  Mancel Bale, MD

## 2022-07-24 ENCOUNTER — Other Ambulatory Visit: Payer: Self-pay

## 2022-07-28 ENCOUNTER — Other Ambulatory Visit: Payer: Self-pay

## 2022-07-30 ENCOUNTER — Other Ambulatory Visit: Payer: Self-pay

## 2022-07-30 DIAGNOSIS — C187 Malignant neoplasm of sigmoid colon: Secondary | ICD-10-CM

## 2022-08-01 ENCOUNTER — Other Ambulatory Visit: Payer: Self-pay | Admitting: Oncology

## 2022-08-02 ENCOUNTER — Other Ambulatory Visit (HOSPITAL_COMMUNITY): Payer: Self-pay

## 2022-08-03 ENCOUNTER — Encounter: Payer: Self-pay | Admitting: Nurse Practitioner

## 2022-08-03 ENCOUNTER — Telehealth: Payer: Self-pay

## 2022-08-03 ENCOUNTER — Inpatient Hospital Stay: Payer: Medicare Other | Admitting: Nurse Practitioner

## 2022-08-03 ENCOUNTER — Inpatient Hospital Stay: Payer: Medicare Other

## 2022-08-03 VITALS — BP 150/71 | HR 62 | Temp 98.1°F | Resp 18 | Ht 68.0 in | Wt 204.8 lb

## 2022-08-03 DIAGNOSIS — C7802 Secondary malignant neoplasm of left lung: Secondary | ICD-10-CM | POA: Diagnosis not present

## 2022-08-03 DIAGNOSIS — C7801 Secondary malignant neoplasm of right lung: Secondary | ICD-10-CM | POA: Diagnosis not present

## 2022-08-03 DIAGNOSIS — Z95828 Presence of other vascular implants and grafts: Secondary | ICD-10-CM

## 2022-08-03 DIAGNOSIS — C187 Malignant neoplasm of sigmoid colon: Secondary | ICD-10-CM

## 2022-08-03 LAB — CBC WITH DIFFERENTIAL (CANCER CENTER ONLY)
Abs Immature Granulocytes: 0.02 10*3/uL (ref 0.00–0.07)
Basophils Absolute: 0.1 10*3/uL (ref 0.0–0.1)
Basophils Relative: 2 %
Eosinophils Absolute: 0.7 10*3/uL — ABNORMAL HIGH (ref 0.0–0.5)
Eosinophils Relative: 8 %
HCT: 43.8 % (ref 39.0–52.0)
Hemoglobin: 15.1 g/dL (ref 13.0–17.0)
Immature Granulocytes: 0 %
Lymphocytes Relative: 25 %
Lymphs Abs: 2.2 10*3/uL (ref 0.7–4.0)
MCH: 33.1 pg (ref 26.0–34.0)
MCHC: 34.5 g/dL (ref 30.0–36.0)
MCV: 96.1 fL (ref 80.0–100.0)
Monocytes Absolute: 0.7 10*3/uL (ref 0.1–1.0)
Monocytes Relative: 8 %
Neutro Abs: 5.1 10*3/uL (ref 1.7–7.7)
Neutrophils Relative %: 57 %
Platelet Count: 281 10*3/uL (ref 150–400)
RBC: 4.56 MIL/uL (ref 4.22–5.81)
RDW: 14.6 % (ref 11.5–15.5)
WBC Count: 8.8 10*3/uL (ref 4.0–10.5)
nRBC: 0 % (ref 0.0–0.2)

## 2022-08-03 LAB — CMP (CANCER CENTER ONLY)
ALT: 16 U/L (ref 0–44)
AST: 18 U/L (ref 15–41)
Albumin: 3.9 g/dL (ref 3.5–5.0)
Alkaline Phosphatase: 48 U/L (ref 38–126)
Anion gap: 6 (ref 5–15)
BUN: 16 mg/dL (ref 8–23)
CO2: 30 mmol/L (ref 22–32)
Calcium: 8.9 mg/dL (ref 8.9–10.3)
Chloride: 101 mmol/L (ref 98–111)
Creatinine: 0.76 mg/dL (ref 0.61–1.24)
GFR, Estimated: >60 mL/min (ref >60)
Glucose, Bld: 103 mg/dL — ABNORMAL HIGH (ref 70–99)
Potassium: 4 mmol/L (ref 3.5–5.1)
Sodium: 137 mmol/L (ref 135–145)
Total Bilirubin: 0.4 mg/dL (ref 0.3–1.2)
Total Protein: 7 g/dL (ref 6.5–8.1)

## 2022-08-03 LAB — CEA (ACCESS): CEA (CHCC): 23.08 ng/mL — ABNORMAL HIGH (ref 0.00–5.00)

## 2022-08-03 MED ORDER — HEPARIN SOD (PORK) LOCK FLUSH 100 UNIT/ML IV SOLN
500.0000 [IU] | Freq: Once | INTRAVENOUS | Status: AC
  Administered 2022-08-03: 500 [IU] via INTRAVENOUS

## 2022-08-03 MED ORDER — SODIUM CHLORIDE 0.9% FLUSH
10.0000 mL | INTRAVENOUS | Status: AC | PRN
  Administered 2022-08-03: 10 mL via INTRAVENOUS

## 2022-08-03 NOTE — Telephone Encounter (Signed)
Patient gave verbal understanding and had no further questions or concerns  

## 2022-08-03 NOTE — Patient Instructions (Signed)
Central Line, Adult A central line is a long, thin tube (catheter) that can be used to collect blood for testing or to give medicine through a vein. The tip of the central line ends in a large vein just above the heart (vena cava). A central line may be placed because: You need to get medicines or fluids through an IV for a long period of time. You need nutrition but cannot eat or absorb nutrients. The veins in your hands or arms are difficult to use for IV access. You need a blood transfusion. You need chemotherapy or dialysis. Types of central lines There are four main types of central lines: Peripherally inserted central catheter (PICC) line. This type is used for access of one week or longer. It can be used to draw blood and give fluids or medicines. A PICC looks like an IV tube, but it goes up the arm to the heart. It is usually inserted in the upper arm and taped in place on the arm. Tunneled central line. This type is used for long-term therapy and dialysis. It is placed in a large vein in the neck, chest, or groin. It is inserted through a small incision made over the vein, and then it is advanced to the heart. It is tunneled under the skin and brought out through a second incision. Non-tunneled central line. This type is used for short-term access, usually for a maximum of 7 days. It is often used in the emergency department. It is inserted in the neck, chest, or groin. Implanted port. This type is used for long-term therapy. It can stay in place longer than other types of central lines. It is normally inserted in the upper chest, but it can also be placed in the upper arm or the abdomen. It is inserted and removed with surgery, and it is accessed using a needle. The type of central line that you receive depends on how long you will need it, your medical condition, and the condition of your veins. Tell a health care provider about: Any allergies you have. All medicines you are taking,  including vitamins, herbs, eye drops, creams, and over-the-counter medicines. Any problems you or family members have had with anesthetic medicines. Any blood disorders you have. Any surgeries you have had. Any medical conditions you have. Whether you are pregnant or may be pregnant. What are the risks? Generally, placement and use of a central line is safe. However, problems may occur, including: Infection. A blood clot that blocks the central line or forms in the vein and travels to the heart. Bleeding from the place where the central line was inserted. Developing a hole or crack within the central line. If this happens, the central line will need to be replaced. Central line failure. The catheter moving or coming out of place. What happens before the procedure? Medicines Ask your health care provider about: Changing or stopping your regular medicines. This is especially important if you are taking diabetes medicines or blood thinners. Taking medicines such as aspirin and ibuprofen. These medicines can thin your blood. Do not take these medicines unless your health care provider tells you to take them. Taking over-the-counter medicines, vitamins, herbs, and supplements. General instructions Follow instructions from your health care provider about eating or drinking restrictions. Ask your health care provider: How your procedure site will be marked. What steps will be taken to help prevent infection. These steps may include: Removing hair at the procedure site. Washing skin with a germ-killing soap.   Plan to have a responsible adult take you home from the hospital or clinic. If you will be going home right after the procedure, plan to have a responsible adult care for you for the time you are told. This is important. What happens during the procedure? The procedure will vary depending on the type of central line being placed. In general: An IV will be inserted into one of your  veins. You will be given one or more of the following: A medicine to help you relax (sedative). A medicine to numb the area (local anesthetic). Your skin will be cleaned with a germ-killing (antiseptic) solution, and you may be covered with sterile drapes. Your blood pressure, heart rate, breathing rate, and blood oxygen level will be monitored during the procedure. The central line catheter will be inserted into the vein and advanced to the correct spot. The health care provider may use X-ray equipment to help guide the catheter to the right place. A bandage (dressing) will be placed over the insertion area. The procedure may vary among health care providers and hospitals. What can I expect after the procedure? Your blood pressure, heart rate, breathing rate, and blood oxygen level will be monitored until you leave the hospital or clinic. Antiseptic caps may be placed on the ends of the central line tubing. If you were given a sedative during the procedure, it can affect you for several hours. Do not drive or operate machinery until your health care provider says that it is safe. Follow these instructions at home: Flushing and cleaning the central line  Follow instructions from your health care provider about flushing and cleaning the central line and the area around it. Only use sterile supplies to flush the central line. Use supplies from your health care provider, a pharmacy, or another source that is recommended by your health care provider. Before you flush the central line or clean the central line or the area around it: Wash your hands with soap and water for at least 20 seconds. If soap and water are not available, use alcohol-based hand sanitizer. Clean the central line hub with rubbing alcohol. Unless directed otherwise by the manufacturer's instructions, scrub using a twisting motion and rub for 10 to 15 seconds or for 30 twists. Be sure you scrub the top of the hub, not just the  sides. Never reuse alcohol pads. Let the hub dry before use. Prevent it from touching anything while drying. Caring for the incision or central line site Check your incision or central line site every day for signs of infection. Check for: Redness, swelling, or pain. Fluid or blood. Warmth. Pus or a bad smell. Keep the insertion site of your central line clean and dry at all times. Change your dressing only as told by your health care provider. Keep your dressing dry. If it gets wet, have it changed as soon as possible. General instructions Follow instructions from your health care provider for the type of device that you have. Keep the tube clamped, unless it is being used. If the central line accidentally gets pulled on, make sure: The dressing is okay. There is no bleeding. The line has not been pulled out. Do not use scissors or sharp objects near the tube. Do not take baths, swim, or use a hot tub until your health care provider approves. Ask your health care provider if you may take showers. You may only be allowed to take sponge baths. Ask your health care provider what activities are   safe for you. You may be restricted from lifting or making repetitive arm movements on the side of your central line. Take over-the-counter and prescription medicines only as told by your health care provider. Keep all follow-up visits. This is important. Storage and disposal of supplies Keep your supplies in a clean, dry location. Throw away any used syringes in a disposal container that is meant for sharp items (sharps container). You can buy a sharps container from a pharmacy, or you can make one by using an empty hard plastic bottle with a cover. Place any used dressings or infusion bags into a plastic bag. Throw that bag in the trash. Contact a health care provider if: You have redness, swelling, or pain around your insertion site. You have fluid or blood coming from your insertion site. Your  insertion site feels warm to the touch. You have pus or a bad smell coming from your insertion site. Get help right away if: You have: A fever or chills. Shortness of breath. Chest pain or a racing heartbeat. Swelling in your neck, face, chest, or arm on the side of your central line. You feel dizzy or you faint. Your incision or central line site has red streaks spreading away from the area. Your incision or central line site is bleeding and does not stop. Your central line is difficult to flush or will not flush. You do not get a blood return from the central line. Your central line gets loose or damaged or comes out. Your catheter leaks when flushed or when fluids are infused into it. Summary A central line is a long, thin tube (catheter) that can be used to give medicine through a vein. Follow specific instructions from your health care provider for the type of device that you have. Keep the insertion site of your central line clean and dry at all times. Keep the tube clamped, unless it is being used. This information is not intended to replace advice given to you by your health care provider. Make sure you discuss any questions you have with your health care provider. Document Revised: 09/27/2019 Document Reviewed: 09/27/2019 Elsevier Patient Education  2024 [employer]  

## 2022-08-03 NOTE — Progress Notes (Signed)
Patient seen by Lisa Thomas NP today  Vitals are within treatment parameters.  Labs reviewed by Lisa Thomas NP and are within treatment parameters.  Per physician team, patient will not be receiving treatment today.  

## 2022-08-03 NOTE — Addendum Note (Signed)
Addended by: Dimitri Ped on: 08/03/2022 09:49 AM   Modules accepted: Orders

## 2022-08-03 NOTE — Progress Notes (Signed)
[facility] OFFICE PROGRESS NOTE   Diagnosis: Colon cancer  INTERVAL HISTORY:   Craig Best returns as scheduled.  He is seen today for reevaluation prior to resuming Xeloda and Avastin.  Overall he feels better.  The episodes of "head pressure" are fewer.  He continues to belch frequently.  His wife feels like his bowel sounds are abnormally loud.  Bowels move regularly as long as he takes a laxative.  He feels his balance is better.  No shortness of breath.  No cough or fever.  He feels unwell for several days after an Avastin infusion and declines to resume.  Objective:  Vital signs in last 24 hours:  Blood pressure (!) 150/71, pulse 62, temperature 98.1 F (36.7 C), temperature source Oral, resp. rate 18, height 5\' 8"  (1.727 m), weight 204 lb 12.8 oz (92.9 kg), SpO2 98 %.    HEENT: No thrush or ulcers. Resp: Lungs clear bilaterally. Cardio: Regular rate and rhythm. GI: Abdomen soft and nontender.  No apparent ascites.  No hepatomegaly.  Left lower quadrant colostomy. Vascular: No leg edema. Neuro: Alert and oriented. Skin: Palms without erythema. Port-A-Cath without erythema.  Lab Results:  Lab Results  Component Value Date   WBC 8.8 08/03/2022   HGB 15.1 08/03/2022   HCT 43.8 08/03/2022   MCV 96.1 08/03/2022   PLT 281 08/03/2022   NEUTROABS 5.1 08/03/2022    Imaging:  No results found.  Medications: I have reviewed the patient's current medications.  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), status post a sigmoid colectomy and partial bladder resection 09/19/2020 2 separate sigmoid colon tumors, 2.5 cm apart, 1/13 lymph nodes positive, tumor extends to adherent soft tissue of the bladder, MSS, normal mismatch repair protein expression tumor mutation burden 0, K-ras G12D CT abdomen/pelvis 09/19/2020-sigmoid colon mass with colonic obstruction, 6 mm right lower lobe nodule CT chest 09/22/2020-multiple bilateral lung nodules  consistent with metastases Elevated preoperative CEA Cycle 1 FOLFOX 10/28/2020 Cycle 2 FOLFOX 11/11/2020 Cycle 3 FOLFOX 11/25/2020 Cycle 4 FOLFOX 12/09/2020 Cycle 5 FOLFOX 12/23/2020 CT chest 12/25/2020-unchanged lung nodules, 2 cm subpleural nodule in the left posterior chest more prominent-loculated fluid? Cycle 6 FOLFOX 02/03/2021-5-FU dose reduced and bolus eliminated secondary to diarrhea Cycle 7 FOLFOX 02/17/2021 Cycle 8 FOLFOX 03/03/2021 PET 03/17/2021 pulmonary nodules without significant change, 10 mm nodule in right middle lobe with mild FDG uptake, mildly hypermetabolic pleural nodule in the lower left hemothorax, new clustered nodularity in the apical and posterior right upper lobe-likely infectious CTs 07/23/2021-new left pleural effusion with multiple pleural nodules, increased size of several pulmonary nodules, stable left paracolic gutter nodule, morphologic features suggestive of early cirrhosis Thoracentesis 07/24/2021-1200 cc of fluid removed, rare atypical cells present Thoracentesis 627 2023-1.5 L of fluid removed, atypical cells present, acute inflammation and blood Thoracentesis 08/14/2021- 1.2 L of fluid removed, adenocarcinoma compatible with metastatic colon cancer Thoracentesis 08/21/2021-adenocarcinoma Cycle 1 FOLFIRI/bevacizumab 09/01/2021 Cycle 2 FOLFIRI/bevacizumab 09/15/2021 Cycle 3 FOLFIRI/bevacizumab 09/29/2021-Decadron prophylaxis added for delayed nausea Cycle 4 FOLFIRI/bevacizumab 10/13/2021 Cycle 5 FOLFIRI/bevacizumab 10/27/2021 CT chest 10/28/2021-improvement in left pleural effusion, rule/parenchymal metastases-some are slightly smaller, no new lesions Cycle 6 FOLFIRI/bevacizumab 11/10/2021 Cycle 7 FOLFIRI/bevacizumab 12/01/2021 Cycle 8 FOLFIRI/bevacizumab 12/22/2021 CT chest 01/07/2022-stable pulmonary parenchymal and pleural metastatic disease. Cycle 9 FOLFIRI/bevacizumab 02/09/2022 03/02/2022 treatment discontinued due to poor tolerance Xeloda 7 days on/7 days off  beginning 03/08/2022, Avastin continued every 3 weeks CT chest 03/09/2022-no significant change in appearance of bilateral pulmonary metastasis, trace left pleural effusion, nodular liver contour Xeloda  7 days on/7 days off and Avastin every 3 weeks CT chest 06/01/2022-mild enlargement of several pulmonary metastases, trace left pleural effusion Xeloda 7 days on/7 days off and Avastin every 3 weeks 06/17/2022 Xeloda resume 7 days on/7 days off 08/03/2022, declines Avastin  2.  Port-A-Cath placement-Dr. 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  Vertigo April 2024 5.  MRI brain 07/20/2022-no acute or reversible finding.  No evidence of metastatic disease.  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  Disposition: Craig Best appears stable.  He agrees to resume Xeloda 7 days on/7 days off.  He declines to resume Avastin.  We mutually decided to proceed with restaging CT scans in the next few weeks.  Most recent CEA was stable.  He continues to have excessive belching.  We discussed a referral to GI.  He agrees.  He would like to see Dr. Lanae Boast.  Referral placed today.  He will return for follow-up in 3 weeks.  We are available to see him sooner if needed.    Lonna Cobb ANP/GNP-BC   08/03/2022  9:23 AM

## 2022-08-03 NOTE — Telephone Encounter (Signed)
-----   Message from Rana Snare, NP sent at 08/03/2022 10:29 AM EDT ----- Please let him know the CEA is stable.

## 2022-08-04 ENCOUNTER — Other Ambulatory Visit (HOSPITAL_COMMUNITY): Payer: Self-pay

## 2022-08-04 ENCOUNTER — Other Ambulatory Visit: Payer: Self-pay

## 2022-08-04 ENCOUNTER — Encounter (HOSPITAL_COMMUNITY): Payer: Self-pay

## 2022-08-05 ENCOUNTER — Ambulatory Visit: Payer: Medicare Other | Admitting: Nurse Practitioner

## 2022-08-05 ENCOUNTER — Other Ambulatory Visit: Payer: Medicare Other

## 2022-08-06 ENCOUNTER — Other Ambulatory Visit (HOSPITAL_COMMUNITY): Payer: Self-pay

## 2022-08-06 ENCOUNTER — Other Ambulatory Visit: Payer: Self-pay | Admitting: Oncology

## 2022-08-06 DIAGNOSIS — C187 Malignant neoplasm of sigmoid colon: Secondary | ICD-10-CM

## 2022-08-08 ENCOUNTER — Other Ambulatory Visit: Payer: Self-pay

## 2022-08-09 ENCOUNTER — Encounter: Payer: Self-pay | Admitting: *Deleted

## 2022-08-09 ENCOUNTER — Other Ambulatory Visit: Payer: Self-pay

## 2022-08-09 ENCOUNTER — Other Ambulatory Visit (HOSPITAL_COMMUNITY): Payer: Self-pay

## 2022-08-09 MED ORDER — CAPECITABINE 500 MG PO TABS
1500.0000 mg | ORAL_TABLET | Freq: Two times a day (BID) | ORAL | 0 refills | Status: DC
Start: 2022-08-09 — End: 2022-09-01
  Filled 2022-08-09: qty 84, 14d supply, fill #0

## 2022-08-09 NOTE — Progress Notes (Signed)
PATIENT NAVIGATOR PROGRESS NOTE  Name: Craig Best Date: 08/09/2022 MRN: [id number]  DOB: 17-Oct-1940   Reason for visit:  F/U on GI referral  Comments:  Called Dr Jarold Motto office to F/U on referral for reflux/belching. The office stated that they saw him in 2023 so he doesn't need referral and they would call today and get him scheduled with Dr Lanae Boast    Time spent counseling/coordinating care: 15-30 minutes

## 2022-08-19 ENCOUNTER — Ambulatory Visit (HOSPITAL_BASED_OUTPATIENT_CLINIC_OR_DEPARTMENT_OTHER)
Admission: RE | Admit: 2022-08-19 | Discharge: 2022-08-19 | Disposition: A | Discharge status: Home / Self Care | Payer: Medicare Other | Source: Ambulatory Visit | Attending: Nurse Practitioner | Admitting: Nurse Practitioner

## 2022-08-19 ENCOUNTER — Encounter (HOSPITAL_BASED_OUTPATIENT_CLINIC_OR_DEPARTMENT_OTHER): Payer: Self-pay

## 2022-08-19 DIAGNOSIS — I700 Atherosclerosis of aorta: Secondary | ICD-10-CM | POA: Diagnosis not present

## 2022-08-19 DIAGNOSIS — K8020 Calculus of gallbladder without cholecystitis without obstruction: Secondary | ICD-10-CM | POA: Diagnosis not present

## 2022-08-19 DIAGNOSIS — C187 Malignant neoplasm of sigmoid colon: Secondary | ICD-10-CM | POA: Diagnosis not present

## 2022-08-19 DIAGNOSIS — C782 Secondary malignant neoplasm of pleura: Secondary | ICD-10-CM | POA: Diagnosis not present

## 2022-08-19 DIAGNOSIS — C189 Malignant neoplasm of colon, unspecified: Secondary | ICD-10-CM | POA: Diagnosis not present

## 2022-08-19 MED ORDER — IOHEXOL 300 MG/ML  SOLN
100.0000 mL | Freq: Once | INTRAMUSCULAR | Status: AC | PRN
  Administered 2022-08-19: 85 mL via INTRAVENOUS

## 2022-08-22 ENCOUNTER — Other Ambulatory Visit: Payer: Self-pay | Admitting: Oncology

## 2022-08-22 DIAGNOSIS — C187 Malignant neoplasm of sigmoid colon: Secondary | ICD-10-CM

## 2022-08-24 ENCOUNTER — Inpatient Hospital Stay: Payer: Medicare Other

## 2022-08-24 ENCOUNTER — Encounter: Payer: Self-pay | Admitting: Nurse Practitioner

## 2022-08-24 ENCOUNTER — Inpatient Hospital Stay: Payer: Medicare Other | Admitting: Nurse Practitioner

## 2022-08-24 ENCOUNTER — Inpatient Hospital Stay: Payer: Medicare Other | Attending: Oncology | Admitting: *Deleted

## 2022-08-24 VITALS — BP 150/71 | HR 70 | Temp 98.1°F | Resp 18 | Ht 68.0 in | Wt 205.0 lb

## 2022-08-24 DIAGNOSIS — C187 Malignant neoplasm of sigmoid colon: Secondary | ICD-10-CM | POA: Insufficient documentation

## 2022-08-24 DIAGNOSIS — C7800 Secondary malignant neoplasm of unspecified lung: Secondary | ICD-10-CM | POA: Diagnosis not present

## 2022-08-24 DIAGNOSIS — Z95828 Presence of other vascular implants and grafts: Secondary | ICD-10-CM

## 2022-08-24 LAB — CMP (CANCER CENTER ONLY)
ALT: 14 U/L (ref 0–44)
AST: 18 U/L (ref 15–41)
Albumin: 4.4 g/dL (ref 3.5–5.0)
Alkaline Phosphatase: 63 U/L (ref 38–126)
Anion gap: 8 (ref 5–15)
BUN: 14 mg/dL (ref 8–23)
CO2: 29 mmol/L (ref 22–32)
Calcium: 9.5 mg/dL (ref 8.9–10.3)
Chloride: 100 mmol/L (ref 98–111)
Creatinine: 0.87 mg/dL (ref 0.61–1.24)
GFR, Estimated: >60 mL/min (ref >60)
Glucose, Bld: 114 mg/dL — ABNORMAL HIGH (ref 70–99)
Potassium: 4 mmol/L (ref 3.5–5.1)
Sodium: 137 mmol/L (ref 135–145)
Total Bilirubin: 0.6 mg/dL (ref 0.3–1.2)
Total Protein: 7.7 g/dL (ref 6.5–8.1)

## 2022-08-24 LAB — CBC WITH DIFFERENTIAL (CANCER CENTER ONLY)
Abs Immature Granulocytes: 0.02 10*3/uL (ref 0.00–0.07)
Basophils Absolute: 0.1 10*3/uL (ref 0.0–0.1)
Basophils Relative: 1 %
Eosinophils Absolute: 0.6 10*3/uL — ABNORMAL HIGH (ref 0.0–0.5)
Eosinophils Relative: 6 %
HCT: 47.6 % (ref 39.0–52.0)
Hemoglobin: 16.2 g/dL (ref 13.0–17.0)
Immature Granulocytes: 0 %
Lymphocytes Relative: 24 %
Lymphs Abs: 2.4 10*3/uL (ref 0.7–4.0)
MCH: 32.4 pg (ref 26.0–34.0)
MCHC: 34 g/dL (ref 30.0–36.0)
MCV: 95.2 fL (ref 80.0–100.0)
Monocytes Absolute: 0.7 10*3/uL (ref 0.1–1.0)
Monocytes Relative: 7 %
Neutro Abs: 6.2 10*3/uL (ref 1.7–7.7)
Neutrophils Relative %: 62 %
Platelet Count: 314 10*3/uL (ref 150–400)
RBC: 5 MIL/uL (ref 4.22–5.81)
RDW: 13.7 % (ref 11.5–15.5)
WBC Count: 10.1 10*3/uL (ref 4.0–10.5)
nRBC: 0 % (ref 0.0–0.2)

## 2022-08-24 MED ORDER — SODIUM CHLORIDE 0.9% FLUSH
10.0000 mL | Freq: Once | INTRAVENOUS | Status: AC
  Administered 2022-08-24: 10 mL via INTRAVENOUS

## 2022-08-24 MED ORDER — HEPARIN SOD (PORK) LOCK FLUSH 100 UNIT/ML IV SOLN
500.0000 [IU] | Freq: Once | INTRAVENOUS | Status: AC
  Administered 2022-08-24: 500 [IU] via INTRAVENOUS

## 2022-08-24 NOTE — Patient Instructions (Signed)

## 2022-08-24 NOTE — Progress Notes (Signed)
Craig Best OFFICE PROGRESS NOTE   Diagnosis: Colon cancer  INTERVAL HISTORY:   Craig Best returns as scheduled.  He resumed Xeloda following the last office visit, 7 days on/7 days off.  He declined to resume Avastin.  He denies nausea/vomiting.  No diarrhea.  He has a sore at the lower anterior gumline related to his dentures.  No hand or foot pain or redness.  Belching is much better.  Head has "cleared".  No shortness of breath or cough.  Objective:  Vital signs in last 24 hours:  Blood pressure (!) 150/71, pulse 70, temperature 98.1 F (36.7 C), temperature source Oral, resp. rate 18, height 5\' 8"  (1.727 m), weight 205 lb (93 kg), SpO2 98%.    HEENT: No thrush or ulcers (dentures not removed). Resp: Lungs clear bilaterally. Cardio: Regular rate and rhythm. GI: No hepatosplenomegaly.  Left lower quadrant colostomy with formed stool in the collection bag. Vascular: No leg edema. Neuro: Alert and oriented. Skin: Palms without erythema. Port-A-Cath without erythema.  Lab Results:  Lab Results  Component Value Date   WBC 10.1 08/24/2022   HGB 16.2 08/24/2022   HCT 47.6 08/24/2022   MCV 95.2 08/24/2022   PLT 314 08/24/2022   NEUTROABS 6.2 08/24/2022    Imaging:  No results found.  Medications: I have reviewed the patient's current medications.  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), status post a sigmoid colectomy and partial bladder resection 09/19/2020 2 separate sigmoid colon tumors, 2.5 cm apart, 1/13 lymph nodes positive, tumor extends to adherent soft tissue of the bladder, MSS, normal mismatch repair protein expression tumor mutation burden 0, K-ras G12D CT abdomen/pelvis 09/19/2020-sigmoid colon mass with colonic obstruction, 6 mm right lower lobe nodule CT chest 09/22/2020-multiple bilateral lung nodules consistent with metastases Elevated preoperative CEA Cycle 1 FOLFOX 10/28/2020 Cycle 2 FOLFOX 11/11/2020 Cycle 3  FOLFOX 11/25/2020 Cycle 4 FOLFOX 12/09/2020 Cycle 5 FOLFOX 12/23/2020 CT chest 12/25/2020-unchanged lung nodules, 2 cm subpleural nodule in the left posterior chest more prominent-loculated fluid? Cycle 6 FOLFOX 02/03/2021-5-FU dose reduced and bolus eliminated secondary to diarrhea Cycle 7 FOLFOX 02/17/2021 Cycle 8 FOLFOX 03/03/2021 PET 03/17/2021 pulmonary nodules without significant change, 10 mm nodule in right middle lobe with mild FDG uptake, mildly hypermetabolic pleural nodule in the lower left hemothorax, new clustered nodularity in the apical and posterior right upper lobe-likely infectious CTs 07/23/2021-new left pleural effusion with multiple pleural nodules, increased size of several pulmonary nodules, stable left paracolic gutter nodule, morphologic features suggestive of early cirrhosis Thoracentesis 07/24/2021-1200 cc of fluid removed, rare atypical cells present Thoracentesis 627 2023-1.5 L of fluid removed, atypical cells present, acute inflammation and blood Thoracentesis 08/14/2021- 1.2 L of fluid removed, adenocarcinoma compatible with metastatic colon cancer Thoracentesis 08/21/2021-adenocarcinoma Cycle 1 FOLFIRI/bevacizumab 09/01/2021 Cycle 2 FOLFIRI/bevacizumab 09/15/2021 Cycle 3 FOLFIRI/bevacizumab 09/29/2021-Decadron prophylaxis added for delayed nausea Cycle 4 FOLFIRI/bevacizumab 10/13/2021 Cycle 5 FOLFIRI/bevacizumab 10/27/2021 CT chest 10/28/2021-improvement in left pleural effusion, rule/parenchymal metastases-some are slightly smaller, no new lesions Cycle 6 FOLFIRI/bevacizumab 11/10/2021 Cycle 7 FOLFIRI/bevacizumab 12/01/2021 Cycle 8 FOLFIRI/bevacizumab 12/22/2021 CT chest 01/07/2022-stable pulmonary parenchymal and pleural metastatic disease. Cycle 9 FOLFIRI/bevacizumab 02/09/2022 03/02/2022 treatment discontinued due to poor tolerance Xeloda 7 days on/7 days off beginning 03/08/2022, Avastin continued every 3 weeks CT chest 03/09/2022-no significant change in appearance of  bilateral pulmonary metastasis, trace left pleural effusion, nodular liver contour Xeloda 7 days on/7 days off and Avastin every 3 weeks CT chest 06/01/2022-mild enlargement of several pulmonary metastases, trace left pleural effusion Xeloda  7 days on/7 days off and Avastin every 3 weeks 06/17/2022 Xeloda resume 7 days on/7 days off 08/03/2022, declines Avastin CTs 08/19/2022-pleural lung nodules appear to be slightly diminished in size, pulmonary parenchymal nodules appear to be slightly enlarged, overall little change.  No evidence of lymphadenopathy or metastatic disease in the abdomen or pelvis. Xeloda continued 7 days on/7 days off 08/24/2022  2.  Port-A-Cath placement-Dr. 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  Vertigo April 2024 5.  MRI brain 07/20/2022-no acute or reversible finding.  No evidence of metastatic disease.  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  Disposition: Craig Best appears stable.  He resumed Xeloda 7 days on/7 days off following the last office visit.  He has declined further Avastin.  He seems to be tolerating the Xeloda well.  Recent restaging chest CT shows overall stable disease.  Report/images reviewed with [name] and his wife at today's visit.  Plan to continue Xeloda as he is currently taking.  He agrees with this plan.  He will return for lab and follow-up in 4 weeks.  He will contact the office in the interim with any problems.  Patient seen with Dr. Truett Perna.    Lonna Cobb ANP/GNP-BC   08/24/2022  10:44 AM  This was a shared visit with Lonna Cobb.  We reviewed the restaging CT findings and images with Craig Best.  The overall pattern is consistent with stable disease.  I recommend continuing Xeloda and bevacizumab.  Craig Best is concerned the bevacizumab because the vertigo and  headache he experienced over the past few months, even though he started bevacizumab based therapy in July 2023.  He will continue single agent capecitabine.  I was present for greater than 50% of today's visit.  I performed medical decision making.  Mancel Bale, MD

## 2022-08-25 ENCOUNTER — Other Ambulatory Visit: Payer: Self-pay

## 2022-08-25 ENCOUNTER — Other Ambulatory Visit: Payer: Self-pay | Admitting: Nurse Practitioner

## 2022-08-25 ENCOUNTER — Encounter: Payer: Self-pay | Admitting: Oncology

## 2022-08-25 DIAGNOSIS — C187 Malignant neoplasm of sigmoid colon: Secondary | ICD-10-CM

## 2022-08-25 NOTE — Progress Notes (Signed)
Refill requested on Protonix today. Per pharmacy, this can decrease efficacy of Xeloda. Patient has had significant GI distress and this is the only med that has helped. Will continue Protonix per NP.

## 2022-09-01 ENCOUNTER — Other Ambulatory Visit: Payer: Self-pay | Admitting: Oncology

## 2022-09-01 ENCOUNTER — Other Ambulatory Visit (HOSPITAL_COMMUNITY): Payer: Self-pay

## 2022-09-01 ENCOUNTER — Other Ambulatory Visit: Payer: Self-pay

## 2022-09-01 DIAGNOSIS — C187 Malignant neoplasm of sigmoid colon: Secondary | ICD-10-CM

## 2022-09-01 MED ORDER — CAPECITABINE 500 MG PO TABS
1500.0000 mg | ORAL_TABLET | Freq: Two times a day (BID) | ORAL | 0 refills | Status: DC
  Filled 2022-09-01: qty 84, 28d supply, fill #0

## 2022-09-10 ENCOUNTER — Other Ambulatory Visit: Payer: Self-pay

## 2022-09-21 ENCOUNTER — Telehealth: Payer: Self-pay

## 2022-09-21 ENCOUNTER — Inpatient Hospital Stay: Payer: Medicare Other

## 2022-09-21 ENCOUNTER — Inpatient Hospital Stay: Payer: Medicare Other | Admitting: Oncology

## 2022-09-21 ENCOUNTER — Inpatient Hospital Stay: Payer: Medicare Other | Attending: Oncology

## 2022-09-21 VITALS — BP 150/66 | HR 81 | Temp 98.1°F | Resp 18 | Ht 68.0 in | Wt 207.0 lb

## 2022-09-21 DIAGNOSIS — C187 Malignant neoplasm of sigmoid colon: Secondary | ICD-10-CM

## 2022-09-21 DIAGNOSIS — C7801 Secondary malignant neoplasm of right lung: Secondary | ICD-10-CM | POA: Diagnosis not present

## 2022-09-21 DIAGNOSIS — C7802 Secondary malignant neoplasm of left lung: Secondary | ICD-10-CM | POA: Insufficient documentation

## 2022-09-21 LAB — CBC WITH DIFFERENTIAL (CANCER CENTER ONLY)
Abs Immature Granulocytes: 0.03 10*3/uL (ref 0.00–0.07)
Basophils Absolute: 0.1 10*3/uL (ref 0.0–0.1)
Basophils Relative: 2 %
Eosinophils Absolute: 0.5 10*3/uL (ref 0.0–0.5)
Eosinophils Relative: 5 %
HCT: 44.9 % (ref 39.0–52.0)
Hemoglobin: 15.4 g/dL (ref 13.0–17.0)
Immature Granulocytes: 0 %
Lymphocytes Relative: 24 %
Lymphs Abs: 2.2 10*3/uL (ref 0.7–4.0)
MCH: 32.4 pg (ref 26.0–34.0)
MCHC: 34.3 g/dL (ref 30.0–36.0)
MCV: 94.5 fL (ref 80.0–100.0)
Monocytes Absolute: 0.8 10*3/uL (ref 0.1–1.0)
Monocytes Relative: 8 %
Neutro Abs: 5.7 10*3/uL (ref 1.7–7.7)
Neutrophils Relative %: 61 %
Platelet Count: 338 10*3/uL (ref 150–400)
RBC: 4.75 MIL/uL (ref 4.22–5.81)
RDW: 13.2 % (ref 11.5–15.5)
WBC Count: 9.4 10*3/uL (ref 4.0–10.5)
nRBC: 0 % (ref 0.0–0.2)

## 2022-09-21 LAB — CMP (CANCER CENTER ONLY)
ALT: 15 U/L (ref 0–44)
AST: 19 U/L (ref 15–41)
Albumin: 4 g/dL (ref 3.5–5.0)
Alkaline Phosphatase: 76 U/L (ref 38–126)
Anion gap: 7 (ref 5–15)
BUN: 14 mg/dL (ref 8–23)
CO2: 30 mmol/L (ref 22–32)
Calcium: 9.3 mg/dL (ref 8.9–10.3)
Chloride: 100 mmol/L (ref 98–111)
Creatinine: 1.04 mg/dL (ref 0.61–1.24)
GFR, Estimated: >60 mL/min (ref >60)
Glucose, Bld: 105 mg/dL — ABNORMAL HIGH (ref 70–99)
Potassium: 3.8 mmol/L (ref 3.5–5.1)
Sodium: 137 mmol/L (ref 135–145)
Total Bilirubin: 0.5 mg/dL (ref 0.3–1.2)
Total Protein: 7.8 g/dL (ref 6.5–8.1)

## 2022-09-21 LAB — CEA (ACCESS): CEA (CHCC): 29.87 ng/mL — ABNORMAL HIGH (ref 0.00–5.00)

## 2022-09-21 NOTE — Telephone Encounter (Signed)
Called and spoke with pt per MD Truett Perna. Pt verbalizes understanding to stop taking Protonix and states that he will take OTC Pepcid instead.

## 2022-09-21 NOTE — Progress Notes (Signed)
[facility] OFFICE PROGRESS NOTE   Diagnosis: Colon cancer  INTERVAL HISTORY:   [name] returns as scheduled.  He continue Xeloda.  No mouth sores, diarrhea, nausea, or hand/foot pain.  No dyspnea.  He continues working.  Good appetite.  Objective:  Vital signs in last 24 hours:  Blood pressure (!) 150/66, pulse 81, temperature 98.1 F (36.7 C), temperature source Oral, resp. rate 18, height 5\' 8"  (1.727 m), weight 207 lb (93.9 kg), SpO2 100%.    HEENT: No thrush or ulcers Resp: Lungs clear bilaterally with decreased breath sounds at the left posterior chest, no respiratory distress.  Dullness at the left lower posterior chest. Cardio: Regular rate and rhythm GI: No hepatosplenomegaly, no mass, nontender Vascular: No leg edema  Skin: Palms and soles without erythema  Portacath/PICC-without erythema  Lab Results:  Lab Results  Component Value Date   WBC 9.4 09/21/2022   HGB 15.4 09/21/2022   HCT 44.9 09/21/2022   MCV 94.5 09/21/2022   PLT 338 09/21/2022   NEUTROABS 5.7 09/21/2022    CMP  Lab Results  Component Value Date   NA 137 08/24/2022   K 4.0 08/24/2022   CL 100 08/24/2022   CO2 29 08/24/2022   GLUCOSE 114 (H) 08/24/2022   BUN 14 08/24/2022   CREATININE 0.87 08/24/2022   CALCIUM 9.5 08/24/2022   PROT 7.7 08/24/2022   ALBUMIN 4.4 08/24/2022   AST 18 08/24/2022   ALT 14 08/24/2022   ALKPHOS 63 08/24/2022   BILITOT 0.6 08/24/2022   GFRNONAA >60 08/24/2022    Lab Results  Component Value Date   CEA1 63.2 (H) 09/19/2020   CEA 23.08 (H) 08/03/2022    Medications: I have reviewed the patient's current medications.   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), status post a sigmoid colectomy and partial bladder resection 09/19/2020 2 separate sigmoid colon tumors, 2.5 cm apart, 1/13 lymph nodes positive, tumor extends to adherent soft tissue of the bladder, MSS, normal mismatch repair protein expression tumor  mutation burden 0, K-ras G12D CT abdomen/pelvis 09/19/2020-sigmoid colon mass with colonic obstruction, 6 mm right lower lobe nodule CT chest 09/22/2020-multiple bilateral lung nodules consistent with metastases Elevated preoperative CEA Cycle 1 FOLFOX 10/28/2020 Cycle 2 FOLFOX 11/11/2020 Cycle 3 FOLFOX 11/25/2020 Cycle 4 FOLFOX 12/09/2020 Cycle 5 FOLFOX 12/23/2020 CT chest 12/25/2020-unchanged lung nodules, 2 cm subpleural nodule in the left posterior chest more prominent-loculated fluid? Cycle 6 FOLFOX 02/03/2021-5-FU dose reduced and bolus eliminated secondary to diarrhea Cycle 7 FOLFOX 02/17/2021 Cycle 8 FOLFOX 03/03/2021 PET 03/17/2021 pulmonary nodules without significant change, 10 mm nodule in right middle lobe with mild FDG uptake, mildly hypermetabolic pleural nodule in the lower left hemothorax, new clustered nodularity in the apical and posterior right upper lobe-likely infectious CTs 07/23/2021-new left pleural effusion with multiple pleural nodules, increased size of several pulmonary nodules, stable left paracolic gutter nodule, morphologic features suggestive of early cirrhosis Thoracentesis 07/24/2021-1200 cc of fluid removed, rare atypical cells present Thoracentesis 627 2023-1.5 L of fluid removed, atypical cells present, acute inflammation and blood Thoracentesis 08/14/2021- 1.2 L of fluid removed, adenocarcinoma compatible with metastatic colon cancer Thoracentesis 08/21/2021-adenocarcinoma Cycle 1 FOLFIRI/bevacizumab 09/01/2021 Cycle 2 FOLFIRI/bevacizumab 09/15/2021 Cycle 3 FOLFIRI/bevacizumab 09/29/2021-Decadron prophylaxis added for delayed nausea Cycle 4 FOLFIRI/bevacizumab 10/13/2021 Cycle 5 FOLFIRI/bevacizumab 10/27/2021 CT chest 10/28/2021-improvement in left pleural effusion, rule/parenchymal metastases-some are slightly smaller, no new lesions Cycle 6 FOLFIRI/bevacizumab 11/10/2021 Cycle 7 FOLFIRI/bevacizumab 12/01/2021 Cycle 8 FOLFIRI/bevacizumab 12/22/2021 CT chest  01/07/2022-stable pulmonary parenchymal and pleural  metastatic disease. Cycle 9 FOLFIRI/bevacizumab 02/09/2022 03/02/2022 treatment discontinued due to poor tolerance Xeloda 7 days on/7 days off beginning 03/08/2022, Avastin continued every 3 weeks CT chest 03/09/2022-no significant change in appearance of bilateral pulmonary metastasis, trace left pleural effusion, nodular liver contour Xeloda 7 days on/7 days off and Avastin every 3 weeks CT chest 06/01/2022-mild enlargement of several pulmonary metastases, trace left pleural effusion Xeloda 7 days on/7 days off and Avastin every 3 weeks 06/17/2022 Xeloda resume 7 days on/7 days off 08/03/2022, declines Avastin CTs 08/19/2022-pleural lung nodules appear to be slightly diminished in size, pulmonary parenchymal nodules appear to be slightly enlarged, overall little change.  No evidence of lymphadenopathy or metastatic disease in the abdomen or pelvis. Xeloda continued 7 days on/7 days off 08/24/2022  2.  Port-A-Cath placement-Dr. 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  Vertigo April 2024 5.  MRI brain 07/20/2022-no acute or reversible finding.  No evidence of metastatic disease.  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    Disposition: [name] appears unchanged.  There is no clinical evidence for progression of the metastatic colon cancer.  He will continue Xeloda on the current schedule.  He will return for an office and lab visit in 4 weeks.  We will plan for a restaging CT evaluation in approximately 2 months.  We will follow-up on the CEA from today.  [name], MD  09/21/2022  2:38 PM

## 2022-09-23 ENCOUNTER — Other Ambulatory Visit: Payer: Self-pay

## 2022-10-01 ENCOUNTER — Other Ambulatory Visit: Payer: Self-pay

## 2022-10-04 ENCOUNTER — Other Ambulatory Visit: Payer: Self-pay

## 2022-10-08 ENCOUNTER — Other Ambulatory Visit: Payer: Self-pay

## 2022-10-20 ENCOUNTER — Other Ambulatory Visit: Payer: Self-pay

## 2022-10-21 ENCOUNTER — Inpatient Hospital Stay: Payer: Medicare Other | Admitting: Oncology

## 2022-10-21 ENCOUNTER — Inpatient Hospital Stay: Payer: Medicare Other | Admitting: Nurse Practitioner

## 2022-10-21 ENCOUNTER — Inpatient Hospital Stay: Payer: Medicare Other | Attending: Oncology

## 2022-10-21 ENCOUNTER — Inpatient Hospital Stay: Payer: Medicare Other

## 2022-10-21 ENCOUNTER — Ambulatory Visit (HOSPITAL_BASED_OUTPATIENT_CLINIC_OR_DEPARTMENT_OTHER)
Admission: RE | Admit: 2022-10-21 | Discharge: 2022-10-21 | Disposition: A | Discharge status: Home / Self Care | Payer: Medicare Other | Source: Ambulatory Visit | Attending: Nurse Practitioner | Admitting: Nurse Practitioner

## 2022-10-21 ENCOUNTER — Encounter: Payer: Self-pay | Admitting: Nurse Practitioner

## 2022-10-21 VITALS — BP 140/56 | HR 70 | Temp 98.1°F | Resp 18 | Ht 68.0 in | Wt 203.5 lb

## 2022-10-21 DIAGNOSIS — J90 Pleural effusion, not elsewhere classified: Secondary | ICD-10-CM | POA: Diagnosis not present

## 2022-10-21 DIAGNOSIS — R0609 Other forms of dyspnea: Secondary | ICD-10-CM | POA: Insufficient documentation

## 2022-10-21 DIAGNOSIS — C7802 Secondary malignant neoplasm of left lung: Secondary | ICD-10-CM | POA: Insufficient documentation

## 2022-10-21 DIAGNOSIS — J984 Other disorders of lung: Secondary | ICD-10-CM | POA: Diagnosis not present

## 2022-10-21 DIAGNOSIS — C187 Malignant neoplasm of sigmoid colon: Secondary | ICD-10-CM | POA: Insufficient documentation

## 2022-10-21 DIAGNOSIS — C7801 Secondary malignant neoplasm of right lung: Secondary | ICD-10-CM | POA: Insufficient documentation

## 2022-10-21 DIAGNOSIS — R918 Other nonspecific abnormal finding of lung field: Secondary | ICD-10-CM | POA: Diagnosis not present

## 2022-10-21 LAB — CMP (CANCER CENTER ONLY)
ALT: 14 U/L (ref 0–44)
AST: 15 U/L (ref 15–41)
Albumin: 4 g/dL (ref 3.5–5.0)
Alkaline Phosphatase: 61 U/L (ref 38–126)
Anion gap: 11 (ref 5–15)
BUN: 14 mg/dL (ref 8–23)
CO2: 29 mmol/L (ref 22–32)
Calcium: 8.9 mg/dL (ref 8.9–10.3)
Chloride: 97 mmol/L — ABNORMAL LOW (ref 98–111)
Creatinine: 0.93 mg/dL (ref 0.61–1.24)
GFR, Estimated: >60 mL/min (ref >60)
Glucose, Bld: 101 mg/dL — ABNORMAL HIGH (ref 70–99)
Potassium: 3.9 mmol/L (ref 3.5–5.1)
Sodium: 137 mmol/L (ref 135–145)
Total Bilirubin: 0.6 mg/dL (ref 0.3–1.2)
Total Protein: 7.5 g/dL (ref 6.5–8.1)

## 2022-10-21 LAB — CBC WITH DIFFERENTIAL (CANCER CENTER ONLY)
Abs Immature Granulocytes: 0.03 10*3/uL (ref 0.00–0.07)
Basophils Absolute: 0.2 10*3/uL — ABNORMAL HIGH (ref 0.0–0.1)
Basophils Relative: 2 %
Eosinophils Absolute: 0.3 10*3/uL (ref 0.0–0.5)
Eosinophils Relative: 3 %
HCT: 46.1 % (ref 39.0–52.0)
Hemoglobin: 15.7 g/dL (ref 13.0–17.0)
Immature Granulocytes: 0 %
Lymphocytes Relative: 21 %
Lymphs Abs: 1.8 10*3/uL (ref 0.7–4.0)
MCH: 31.8 pg (ref 26.0–34.0)
MCHC: 34.1 g/dL (ref 30.0–36.0)
MCV: 93.5 fL (ref 80.0–100.0)
Monocytes Absolute: 0.7 10*3/uL (ref 0.1–1.0)
Monocytes Relative: 8 %
Neutro Abs: 5.8 10*3/uL (ref 1.7–7.7)
Neutrophils Relative %: 66 %
Platelet Count: 328 10*3/uL (ref 150–400)
RBC: 4.93 MIL/uL (ref 4.22–5.81)
RDW: 13.5 % (ref 11.5–15.5)
WBC Count: 8.7 10*3/uL (ref 4.0–10.5)
nRBC: 0 % (ref 0.0–0.2)

## 2022-10-21 LAB — CEA (ACCESS): CEA (CHCC): 28.53 ng/mL — ABNORMAL HIGH (ref 0.00–5.00)

## 2022-10-21 NOTE — Progress Notes (Signed)
[facility] OFFICE PROGRESS NOTE   Diagnosis: Colon cancer  INTERVAL HISTORY:   [name] returns as scheduled.  He continues Xeloda.  He denies nausea/vomiting.  No sores in the mouth.  His lower lip peeled.  No diarrhea.  No hand or foot pain or redness.  Main complaint is mildly increased dyspnea on exertion.  He wonders if the left lung fluid is "back".  He has a cough which he attributes to a cold, also experiencing nasal congestion.  No fever.  Objective:  Vital signs in last 24 hours:  Blood pressure (!) 140/56, pulse 70, temperature 98.1 F (36.7 C), temperature source Oral, resp. rate 18, height 5\' 8"  (1.727 m), weight 203 lb 8 oz (92.3 kg), SpO2 96%.    HEENT: No thrush or ulcers.  Specifically no lip ulceration. Resp: Breath sounds mildly diminished throughout the left lung field.  No respiratory distress. Cardio: Regular rate and rhythm. GI: No hepatosplenomegaly. Vascular: No leg edema.  Skin: Palms with skin thickening.  No skin breakdown. Port-A-Cath without erythema.  Lab Results:  Lab Results  Component Value Date   WBC 8.7 10/21/2022   HGB 15.7 10/21/2022   HCT 46.1 10/21/2022   MCV 93.5 10/21/2022   PLT 328 10/21/2022   NEUTROABS 5.8 10/21/2022    Imaging:  No results found.  Medications: I have reviewed the patient's current medications.  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), status post a sigmoid colectomy and partial bladder resection 09/19/2020 2 separate sigmoid colon tumors, 2.5 cm apart, 1/13 lymph nodes positive, tumor extends to adherent soft tissue of the bladder, MSS, normal mismatch repair protein expression tumor mutation burden 0, K-ras G12D CT abdomen/pelvis 09/19/2020-sigmoid colon mass with colonic obstruction, 6 mm right lower lobe nodule CT chest 09/22/2020-multiple bilateral lung nodules consistent with metastases Elevated preoperative CEA Cycle 1 FOLFOX 10/28/2020 Cycle 2 FOLFOX  11/11/2020 Cycle 3 FOLFOX 11/25/2020 Cycle 4 FOLFOX 12/09/2020 Cycle 5 FOLFOX 12/23/2020 CT chest 12/25/2020-unchanged lung nodules, 2 cm subpleural nodule in the left posterior chest more prominent-loculated fluid? Cycle 6 FOLFOX 02/03/2021-5-FU dose reduced and bolus eliminated secondary to diarrhea Cycle 7 FOLFOX 02/17/2021 Cycle 8 FOLFOX 03/03/2021 PET 03/17/2021 pulmonary nodules without significant change, 10 mm nodule in right middle lobe with mild FDG uptake, mildly hypermetabolic pleural nodule in the lower left hemothorax, new clustered nodularity in the apical and posterior right upper lobe-likely infectious CTs 07/23/2021-new left pleural effusion with multiple pleural nodules, increased size of several pulmonary nodules, stable left paracolic gutter nodule, morphologic features suggestive of early cirrhosis Thoracentesis 07/24/2021-1200 cc of fluid removed, rare atypical cells present Thoracentesis 627 2023-1.5 L of fluid removed, atypical cells present, acute inflammation and blood Thoracentesis 08/14/2021- 1.2 L of fluid removed, adenocarcinoma compatible with metastatic colon cancer Thoracentesis 08/21/2021-adenocarcinoma Cycle 1 FOLFIRI/bevacizumab 09/01/2021 Cycle 2 FOLFIRI/bevacizumab 09/15/2021 Cycle 3 FOLFIRI/bevacizumab 09/29/2021-Decadron prophylaxis added for delayed nausea Cycle 4 FOLFIRI/bevacizumab 10/13/2021 Cycle 5 FOLFIRI/bevacizumab 10/27/2021 CT chest 10/28/2021-improvement in left pleural effusion, rule/parenchymal metastases-some are slightly smaller, no new lesions Cycle 6 FOLFIRI/bevacizumab 11/10/2021 Cycle 7 FOLFIRI/bevacizumab 12/01/2021 Cycle 8 FOLFIRI/bevacizumab 12/22/2021 CT chest 01/07/2022-stable pulmonary parenchymal and pleural metastatic disease. Cycle 9 FOLFIRI/bevacizumab 02/09/2022 03/02/2022 treatment discontinued due to poor tolerance Xeloda 7 days on/7 days off beginning 03/08/2022, Avastin continued every 3 weeks CT chest 03/09/2022-no significant change in  appearance of bilateral pulmonary metastasis, trace left pleural effusion, nodular liver contour Xeloda 7 days on/7 days off and Avastin every 3 weeks CT chest 06/01/2022-mild enlargement of several pulmonary  metastases, trace left pleural effusion Xeloda 7 days on/7 days off and Avastin every 3 weeks 06/17/2022 Xeloda resume 7 days on/7 days off 08/03/2022, declines Avastin CTs 08/19/2022-pleural lung nodules appear to be slightly diminished in size, pulmonary parenchymal nodules appear to be slightly enlarged, overall little change.  No evidence of lymphadenopathy or metastatic disease in the abdomen or pelvis. Xeloda continued 7 days on/7 days off 08/24/2022 Xeloda dose reduced to 2 tablets twice daily 7 days on/7 days off beginning 10/21/2022 due to mucositis  2.  Port-A-Cath placement-Dr. 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  Vertigo April 2024 5.  MRI brain 07/20/2022-no acute or reversible finding.  No evidence of metastatic disease.  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  Disposition: Mr. Santamarina appears stable.  He is on active treatment with Xeloda 7 days on/7 days off.  He had peeling of the lower lip following the most recent 7-day course, possibly mucositis related to Xeloda.  We will dose reduce Xeloda to 2 tablets twice daily 7 days on/7 days off.  Plan for restaging CTs prior to next office visit.  He has mild dyspnea on exertion.  Chest x-ray shows near complete resolution of the left pleural effusion, rounded nodular density left lung base.  The dyspnea may be due to the current URI symptoms he is experiencing.  He will contact the office with progressive dyspnea.  He will return for follow-up in 1 month.  He will contact the office in the interim as outlined above or with any other problems.  Mr. Olesh had left  the office by the time chest x-ray and CEA results were available.  I contacted him by phone with the results as well as directions for the Xeloda dose reduction.  Lonna Cobb ANP/GNP-BC   10/21/2022  8:56 AM

## 2022-10-22 ENCOUNTER — Other Ambulatory Visit: Payer: Self-pay

## 2022-10-25 ENCOUNTER — Other Ambulatory Visit: Payer: Self-pay

## 2022-10-25 ENCOUNTER — Other Ambulatory Visit: Payer: Self-pay | Admitting: Oncology

## 2022-10-25 ENCOUNTER — Other Ambulatory Visit (HOSPITAL_COMMUNITY): Payer: Self-pay

## 2022-10-25 DIAGNOSIS — C187 Malignant neoplasm of sigmoid colon: Secondary | ICD-10-CM

## 2022-10-25 MED ORDER — CAPECITABINE 500 MG PO TABS
1000.0000 mg | ORAL_TABLET | Freq: Two times a day (BID) | ORAL | 0 refills | Status: DC
Start: 2022-10-25 — End: 2022-11-23
  Filled 2022-10-25 (×2): qty 56, 28d supply, fill #0
  Filled 2022-10-25: qty 84, 21d supply, fill #0

## 2022-11-01 ENCOUNTER — Other Ambulatory Visit: Payer: Self-pay

## 2022-11-11 ENCOUNTER — Ambulatory Visit (HOSPITAL_BASED_OUTPATIENT_CLINIC_OR_DEPARTMENT_OTHER)
Admission: RE | Admit: 2022-11-11 | Discharge: 2022-11-11 | Disposition: A | Discharge status: Home / Self Care | Payer: Medicare Other | Source: Ambulatory Visit | Attending: Nurse Practitioner | Admitting: Nurse Practitioner

## 2022-11-11 DIAGNOSIS — C187 Malignant neoplasm of sigmoid colon: Secondary | ICD-10-CM | POA: Diagnosis not present

## 2022-11-11 DIAGNOSIS — C782 Secondary malignant neoplasm of pleura: Secondary | ICD-10-CM | POA: Diagnosis not present

## 2022-11-11 DIAGNOSIS — C189 Malignant neoplasm of colon, unspecified: Secondary | ICD-10-CM | POA: Diagnosis not present

## 2022-11-11 DIAGNOSIS — C7800 Secondary malignant neoplasm of unspecified lung: Secondary | ICD-10-CM | POA: Diagnosis not present

## 2022-11-11 MED ORDER — IOHEXOL 300 MG/ML  SOLN
100.0000 mL | Freq: Once | INTRAMUSCULAR | Status: AC | PRN
  Administered 2022-11-11: 85 mL via INTRAVENOUS

## 2022-11-17 ENCOUNTER — Other Ambulatory Visit (HOSPITAL_COMMUNITY): Payer: Self-pay

## 2022-11-18 ENCOUNTER — Inpatient Hospital Stay: Payer: Medicare Other | Admitting: Nurse Practitioner

## 2022-11-18 ENCOUNTER — Inpatient Hospital Stay: Payer: Medicare Other | Attending: Oncology

## 2022-11-18 ENCOUNTER — Encounter: Payer: Self-pay | Admitting: Nurse Practitioner

## 2022-11-18 ENCOUNTER — Inpatient Hospital Stay: Payer: Medicare Other

## 2022-11-18 VITALS — BP 143/72 | HR 66 | Temp 98.1°F | Resp 18 | Ht 68.0 in | Wt 204.7 lb

## 2022-11-18 DIAGNOSIS — C7801 Secondary malignant neoplasm of right lung: Secondary | ICD-10-CM | POA: Diagnosis not present

## 2022-11-18 DIAGNOSIS — C187 Malignant neoplasm of sigmoid colon: Secondary | ICD-10-CM | POA: Insufficient documentation

## 2022-11-18 DIAGNOSIS — C7802 Secondary malignant neoplasm of left lung: Secondary | ICD-10-CM | POA: Insufficient documentation

## 2022-11-18 LAB — CBC WITH DIFFERENTIAL (CANCER CENTER ONLY)
Abs Immature Granulocytes: 0.02 10*3/uL (ref 0.00–0.07)
Basophils Absolute: 0.2 10*3/uL — ABNORMAL HIGH (ref 0.0–0.1)
Basophils Relative: 2 %
Eosinophils Absolute: 0.4 10*3/uL (ref 0.0–0.5)
Eosinophils Relative: 4 %
HCT: 48.1 % (ref 39.0–52.0)
Hemoglobin: 16.3 g/dL (ref 13.0–17.0)
Immature Granulocytes: 0 %
Lymphocytes Relative: 22 %
Lymphs Abs: 2 10*3/uL (ref 0.7–4.0)
MCH: 31.2 pg (ref 26.0–34.0)
MCHC: 33.9 g/dL (ref 30.0–36.0)
MCV: 92.1 fL (ref 80.0–100.0)
Monocytes Absolute: 0.7 10*3/uL (ref 0.1–1.0)
Monocytes Relative: 8 %
Neutro Abs: 5.9 10*3/uL (ref 1.7–7.7)
Neutrophils Relative %: 64 %
Platelet Count: 333 10*3/uL (ref 150–400)
RBC: 5.22 MIL/uL (ref 4.22–5.81)
RDW: 14.1 % (ref 11.5–15.5)
WBC Count: 9.2 10*3/uL (ref 4.0–10.5)
nRBC: 0 % (ref 0.0–0.2)

## 2022-11-18 LAB — CMP (CANCER CENTER ONLY)
ALT: 15 U/L (ref 0–44)
AST: 15 U/L (ref 15–41)
Albumin: 4.2 g/dL (ref 3.5–5.0)
Alkaline Phosphatase: 71 U/L (ref 38–126)
Anion gap: 7 (ref 5–15)
BUN: 15 mg/dL (ref 8–23)
CO2: 29 mmol/L (ref 22–32)
Calcium: 9.4 mg/dL (ref 8.9–10.3)
Chloride: 100 mmol/L (ref 98–111)
Creatinine: 0.8 mg/dL (ref 0.61–1.24)
GFR, Estimated: >60 mL/min (ref >60)
Glucose, Bld: 101 mg/dL — ABNORMAL HIGH (ref 70–99)
Potassium: 3.8 mmol/L (ref 3.5–5.1)
Sodium: 136 mmol/L (ref 135–145)
Total Bilirubin: 0.5 mg/dL (ref 0.3–1.2)
Total Protein: 7.7 g/dL (ref 6.5–8.1)

## 2022-11-18 LAB — CEA (ACCESS): CEA (CHCC): 27.76 ng/mL — ABNORMAL HIGH (ref 0.00–5.00)

## 2022-11-18 NOTE — Progress Notes (Signed)
[facility] OFFICE PROGRESS NOTE   Diagnosis: Colon cancer  INTERVAL HISTORY:   [name] returns as scheduled.  He continues Xeloda.  He denies nausea/vomiting.  No mouth sores.  No diarrhea.  No hand or foot pain or redness.  Intermittent belching.  No shortness of breath.  He has a good appetite.  He remains very active.  Objective:  Vital signs in last 24 hours:  Blood pressure (!) 143/72, pulse 66, temperature 98.1 F (36.7 C), temperature source Temporal, resp. rate 18, height 5\' 8"  (1.727 m), weight 204 lb 11.2 oz (92.9 kg), SpO2 100%.    HEENT: No thrush or ulcers. Resp: Lungs clear bilaterally. Cardio: Regular rate and rhythm. GI: No hepatosplenomegaly. Vascular: No leg edema. Skin: Palms without erythema. Port-A-Cath without erythema.  Lab Results:  Lab Results  Component Value Date   WBC 9.2 11/18/2022   HGB 16.3 11/18/2022   HCT 48.1 11/18/2022   MCV 92.1 11/18/2022   PLT 333 11/18/2022   NEUTROABS 5.9 11/18/2022    Imaging:  No results found.  Medications: I have reviewed the patient's current medications.  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), status post a sigmoid colectomy and partial bladder resection 09/19/2020 2 separate sigmoid colon tumors, 2.5 cm apart, 1/13 lymph nodes positive, tumor extends to adherent soft tissue of the bladder, MSS, normal mismatch repair protein expression tumor mutation burden 0, K-ras G12D CT abdomen/pelvis 09/19/2020-sigmoid colon mass with colonic obstruction, 6 mm right lower lobe nodule CT chest 09/22/2020-multiple bilateral lung nodules consistent with metastases Elevated preoperative CEA Cycle 1 FOLFOX 10/28/2020 Cycle 2 FOLFOX 11/11/2020 Cycle 3 FOLFOX 11/25/2020 Cycle 4 FOLFOX 12/09/2020 Cycle 5 FOLFOX 12/23/2020 CT chest 12/25/2020-unchanged lung nodules, 2 cm subpleural nodule in the left posterior chest more prominent-loculated fluid? Cycle 6 FOLFOX 02/03/2021-5-FU  dose reduced and bolus eliminated secondary to diarrhea Cycle 7 FOLFOX 02/17/2021 Cycle 8 FOLFOX 03/03/2021 PET 03/17/2021 pulmonary nodules without significant change, 10 mm nodule in right middle lobe with mild FDG uptake, mildly hypermetabolic pleural nodule in the lower left hemothorax, new clustered nodularity in the apical and posterior right upper lobe-likely infectious CTs 07/23/2021-new left pleural effusion with multiple pleural nodules, increased size of several pulmonary nodules, stable left paracolic gutter nodule, morphologic features suggestive of early cirrhosis Thoracentesis 07/24/2021-1200 cc of fluid removed, rare atypical cells present Thoracentesis 627 2023-1.5 L of fluid removed, atypical cells present, acute inflammation and blood Thoracentesis 08/14/2021- 1.2 L of fluid removed, adenocarcinoma compatible with metastatic colon cancer Thoracentesis 08/21/2021-adenocarcinoma Cycle 1 FOLFIRI/bevacizumab 09/01/2021 Cycle 2 FOLFIRI/bevacizumab 09/15/2021 Cycle 3 FOLFIRI/bevacizumab 09/29/2021-Decadron prophylaxis added for delayed nausea Cycle 4 FOLFIRI/bevacizumab 10/13/2021 Cycle 5 FOLFIRI/bevacizumab 10/27/2021 CT chest 10/28/2021-improvement in left pleural effusion, rule/parenchymal metastases-some are slightly smaller, no new lesions Cycle 6 FOLFIRI/bevacizumab 11/10/2021 Cycle 7 FOLFIRI/bevacizumab 12/01/2021 Cycle 8 FOLFIRI/bevacizumab 12/22/2021 CT chest 01/07/2022-stable pulmonary parenchymal and pleural metastatic disease. Cycle 9 FOLFIRI/bevacizumab 02/09/2022 03/02/2022 treatment discontinued due to poor tolerance Xeloda 7 days on/7 days off beginning 03/08/2022, Avastin continued every 3 weeks CT chest 03/09/2022-no significant change in appearance of bilateral pulmonary metastasis, trace left pleural effusion, nodular liver contour Xeloda 7 days on/7 days off and Avastin every 3 weeks CT chest 06/01/2022-mild enlargement of several pulmonary metastases, trace left pleural  effusion Xeloda 7 days on/7 days off and Avastin every 3 weeks 06/17/2022 Xeloda resume 7 days on/7 days off 08/03/2022, declines Avastin CTs 08/19/2022-pleural lung nodules appear to be slightly diminished in size, pulmonary parenchymal nodules appear to be slightly enlarged,  overall little change.  No evidence of lymphadenopathy or metastatic disease in the abdomen or pelvis. Xeloda continued 7 days on/7 days off 08/24/2022 Xeloda dose reduced to 2 tablets twice daily 7 days on/7 days off beginning 10/21/2022 due to mucositis CTs 11/11/2022-pulmonary nodules have enlarged slightly from 08/19/2022.  Pleural nodularity thickening and fluid in the left lower hemithorax is similar with slight mass effect on the left hemidiaphragm. Xeloda continued 7 days on/7 days off 11/18/2022  2.  Port-A-Cath placement-Dr. 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  Vertigo April 2024 5.  MRI brain 07/20/2022-no acute or reversible finding.  No evidence of metastatic disease.  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  Disposition: [name].  He is currently taking Xeloda 7 days on/7 days off.  He is tolerating well.  CEA is stable.  He has a good performance status.  Recent restaging chest CT overall shows stable disease.  Results/images reviewed with him and his wife at today's visit.  Plan to continue Xeloda as he is currently taking.  CBC and chemistry panel reviewed.  Labs adequate to proceed with Xeloda as above.  He will return for lab and follow-up in 4 weeks.  We are available to see him sooner if needed.  Patient seen with Dr. Truett Perna.    Lonna Cobb ANP/GNP-BC   11/18/2022  11:38 AM  This was a shared visit with Lonna Cobb.  We reviewed the restaging CT findings and images with [name].  The Jorde of the lung lesions  appear unchanged.  The CEA is stable.  I recommend continuing Xeloda on the current schedule.  We will plan for a restaging chest CT in approximately 3 months.  We discussed additional treatment options including resumption of Xeloda/bevacizumab, and switching to a different salvage regimen.  I was present for greater than 50% of today's visit.  I performed medical decision making.  Mancel Bale, MD

## 2022-11-19 ENCOUNTER — Other Ambulatory Visit: Payer: Self-pay

## 2022-11-20 ENCOUNTER — Other Ambulatory Visit: Payer: Self-pay

## 2022-11-23 ENCOUNTER — Other Ambulatory Visit (HOSPITAL_COMMUNITY): Payer: Self-pay

## 2022-11-23 ENCOUNTER — Other Ambulatory Visit: Payer: Self-pay | Admitting: Oncology

## 2022-11-23 ENCOUNTER — Other Ambulatory Visit: Payer: Self-pay

## 2022-11-23 DIAGNOSIS — C187 Malignant neoplasm of sigmoid colon: Secondary | ICD-10-CM

## 2022-11-23 MED ORDER — CAPECITABINE 500 MG PO TABS
1000.0000 mg | ORAL_TABLET | Freq: Two times a day (BID) | ORAL | 1 refills | Status: DC
  Filled 2022-11-23: qty 56, 28d supply, fill #0
  Filled 2022-12-29: qty 56, 28d supply, fill #1

## 2022-11-23 NOTE — Progress Notes (Signed)
Specialty Pharmacy Refill Coordination Note  Craig Best is a 82 y.o. male contacted today regarding refills of specialty medication(s) Capecitabine   Patient requested Delivery   Delivery date: 12/09/22   Verified address: [address] [place], [address]   Medication will be filled on 12/08/22.  Medication was on hold due to mouth sores; resuming 10/21. Refills requested.

## 2022-11-24 ENCOUNTER — Other Ambulatory Visit: Payer: Self-pay

## 2022-11-30 ENCOUNTER — Other Ambulatory Visit: Payer: Self-pay

## 2022-12-12 ENCOUNTER — Other Ambulatory Visit: Payer: Self-pay

## 2022-12-14 ENCOUNTER — Inpatient Hospital Stay: Payer: Medicare Other | Attending: Oncology | Admitting: Nurse Practitioner

## 2022-12-14 ENCOUNTER — Inpatient Hospital Stay: Payer: Medicare Other

## 2022-12-14 ENCOUNTER — Encounter: Payer: Self-pay | Admitting: Nurse Practitioner

## 2022-12-14 VITALS — BP 136/72 | HR 63 | Temp 98.1°F | Resp 18 | Ht 68.0 in | Wt 206.8 lb

## 2022-12-14 DIAGNOSIS — C187 Malignant neoplasm of sigmoid colon: Secondary | ICD-10-CM

## 2022-12-14 DIAGNOSIS — C7801 Secondary malignant neoplasm of right lung: Secondary | ICD-10-CM | POA: Diagnosis not present

## 2022-12-14 DIAGNOSIS — Z95828 Presence of other vascular implants and grafts: Secondary | ICD-10-CM

## 2022-12-14 DIAGNOSIS — R970 Elevated carcinoembryonic antigen [CEA]: Secondary | ICD-10-CM | POA: Insufficient documentation

## 2022-12-14 DIAGNOSIS — C7802 Secondary malignant neoplasm of left lung: Secondary | ICD-10-CM | POA: Diagnosis not present

## 2022-12-14 LAB — CBC WITH DIFFERENTIAL (CANCER CENTER ONLY)
Abs Immature Granulocytes: 0.02 10*3/uL (ref 0.00–0.07)
Basophils Absolute: 0.1 10*3/uL (ref 0.0–0.1)
Basophils Relative: 2 %
Eosinophils Absolute: 0.3 10*3/uL (ref 0.0–0.5)
Eosinophils Relative: 4 %
HCT: 45.3 % (ref 39.0–52.0)
Hemoglobin: 15.1 g/dL (ref 13.0–17.0)
Immature Granulocytes: 0 %
Lymphocytes Relative: 23 %
Lymphs Abs: 2 10*3/uL (ref 0.7–4.0)
MCH: 31 pg (ref 26.0–34.0)
MCHC: 33.3 g/dL (ref 30.0–36.0)
MCV: 93 fL (ref 80.0–100.0)
Monocytes Absolute: 0.7 10*3/uL (ref 0.1–1.0)
Monocytes Relative: 7 %
Neutro Abs: 5.8 10*3/uL (ref 1.7–7.7)
Neutrophils Relative %: 64 %
Platelet Count: 321 10*3/uL (ref 150–400)
RBC: 4.87 MIL/uL (ref 4.22–5.81)
RDW: 14.3 % (ref 11.5–15.5)
WBC Count: 9 10*3/uL (ref 4.0–10.5)
nRBC: 0 % (ref 0.0–0.2)

## 2022-12-14 LAB — CMP (CANCER CENTER ONLY)
ALT: 13 U/L (ref 0–44)
AST: 15 U/L (ref 15–41)
Albumin: 3.9 g/dL (ref 3.5–5.0)
Alkaline Phosphatase: 71 U/L (ref 38–126)
Anion gap: 8 (ref 5–15)
BUN: 14 mg/dL (ref 8–23)
CO2: 28 mmol/L (ref 22–32)
Calcium: 9.5 mg/dL (ref 8.9–10.3)
Chloride: 101 mmol/L (ref 98–111)
Creatinine: 0.92 mg/dL (ref 0.61–1.24)
GFR, Estimated: >60 mL/min (ref >60)
Glucose, Bld: 103 mg/dL — ABNORMAL HIGH (ref 70–99)
Potassium: 3.9 mmol/L (ref 3.5–5.1)
Sodium: 137 mmol/L (ref 135–145)
Total Bilirubin: 0.6 mg/dL (ref <1.2)
Total Protein: 7.3 g/dL (ref 6.5–8.1)

## 2022-12-14 LAB — CEA (ACCESS): CEA (CHCC): 32.8 ng/mL — ABNORMAL HIGH (ref 0.00–5.00)

## 2022-12-14 MED ORDER — HEPARIN SOD (PORK) LOCK FLUSH 100 UNIT/ML IV SOLN
500.0000 [IU] | Freq: Once | INTRAVENOUS | Status: AC
  Administered 2022-12-14: 500 [IU] via INTRAVENOUS

## 2022-12-14 MED ORDER — SODIUM CHLORIDE 0.9% FLUSH
10.0000 mL | Freq: Once | INTRAVENOUS | Status: AC
  Administered 2022-12-14: 10 mL via INTRAVENOUS

## 2022-12-14 NOTE — Progress Notes (Signed)
Craig Best OFFICE PROGRESS NOTE   Diagnosis: Colon cancer  INTERVAL HISTORY:   Craig Best returns as scheduled.  He continues Xeloda 7 days on/7 days off.  He denies nausea/vomiting.  No mouth sores.  No diarrhea.  No hand or foot pain or redness.  Objective:  Vital signs in last 24 hours:  Blood pressure 136/72, pulse 63, temperature 98.1 F (36.7 C), temperature source Temporal, resp. rate 18, height 5\' 8"  (1.727 m), weight 206 lb 12.8 oz (93.8 kg), SpO2 98%.    HEENT: No thrush or ulcers. Resp: Lungs clear bilaterally. Cardio: Regular rate and rhythm. GI: No hepatosplenomegaly. Vascular: No edema. Skin: Palms without erythema. Port-A-Cath without erythema.  Lab Results:  Lab Results  Component Value Date   WBC 9.0 12/14/2022   HGB 15.1 12/14/2022   HCT 45.3 12/14/2022   MCV 93.0 12/14/2022   PLT 321 12/14/2022   NEUTROABS 5.8 12/14/2022    Imaging:  No results found.  Medications: I have reviewed the patient's current medications.  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), status post a sigmoid colectomy and partial bladder resection 09/19/2020 2 separate sigmoid colon tumors, 2.5 cm apart, 1/13 lymph nodes positive, tumor extends to adherent soft tissue of the bladder, MSS, normal mismatch repair protein expression tumor mutation burden 0, K-ras G12D CT abdomen/pelvis 09/19/2020-sigmoid colon mass with colonic obstruction, 6 mm right lower lobe nodule CT chest 09/22/2020-multiple bilateral lung nodules consistent with metastases Elevated preoperative CEA Cycle 1 FOLFOX 10/28/2020 Cycle 2 FOLFOX 11/11/2020 Cycle 3 FOLFOX 11/25/2020 Cycle 4 FOLFOX 12/09/2020 Cycle 5 FOLFOX 12/23/2020 CT chest 12/25/2020-unchanged lung nodules, 2 cm subpleural nodule in the left posterior chest more prominent-loculated fluid? Cycle 6 FOLFOX 02/03/2021-5-FU dose reduced and bolus eliminated secondary to diarrhea Cycle 7 FOLFOX 02/17/2021 Cycle  8 FOLFOX 03/03/2021 PET 03/17/2021 pulmonary nodules without significant change, 10 mm nodule in right middle lobe with mild FDG uptake, mildly hypermetabolic pleural nodule in the lower left hemothorax, new clustered nodularity in the apical and posterior right upper lobe-likely infectious CTs 07/23/2021-new left pleural effusion with multiple pleural nodules, increased size of several pulmonary nodules, stable left paracolic gutter nodule, morphologic features suggestive of early cirrhosis Thoracentesis 07/24/2021-1200 cc of fluid removed, rare atypical cells present Thoracentesis 627 2023-1.5 L of fluid removed, atypical cells present, acute inflammation and blood Thoracentesis 08/14/2021- 1.2 L of fluid removed, adenocarcinoma compatible with metastatic colon cancer Thoracentesis 08/21/2021-adenocarcinoma Cycle 1 FOLFIRI/bevacizumab 09/01/2021 Cycle 2 FOLFIRI/bevacizumab 09/15/2021 Cycle 3 FOLFIRI/bevacizumab 09/29/2021-Decadron prophylaxis added for delayed nausea Cycle 4 FOLFIRI/bevacizumab 10/13/2021 Cycle 5 FOLFIRI/bevacizumab 10/27/2021 CT chest 10/28/2021-improvement in left pleural effusion, rule/parenchymal metastases-some are slightly smaller, no new lesions Cycle 6 FOLFIRI/bevacizumab 11/10/2021 Cycle 7 FOLFIRI/bevacizumab 12/01/2021 Cycle 8 FOLFIRI/bevacizumab 12/22/2021 CT chest 01/07/2022-stable pulmonary parenchymal and pleural metastatic disease. Cycle 9 FOLFIRI/bevacizumab 02/09/2022 03/02/2022 treatment discontinued due to poor tolerance Xeloda 7 days on/7 days off beginning 03/08/2022, Avastin continued every 3 weeks CT chest 03/09/2022-no significant change in appearance of bilateral pulmonary metastasis, trace left pleural effusion, nodular liver contour Xeloda 7 days on/7 days off and Avastin every 3 weeks CT chest 06/01/2022-mild enlargement of several pulmonary metastases, trace left pleural effusion Xeloda 7 days on/7 days off and Avastin every 3 weeks 06/17/2022 Xeloda resume 7 days on/7  days off 08/03/2022, declines Avastin CTs 08/19/2022-pleural lung nodules appear to be slightly diminished in size, pulmonary parenchymal nodules appear to be slightly enlarged, overall little change.  No evidence of lymphadenopathy or metastatic disease in the abdomen or pelvis.  Xeloda continued 7 days on/7 days off 08/24/2022 Xeloda dose reduced to 2 tablets twice daily 7 days on/7 days off beginning 10/21/2022 due to mucositis CTs 11/11/2022-pulmonary nodules have enlarged slightly from 08/19/2022.  Pleural nodularity thickening and fluid in the left lower hemithorax is similar with slight mass effect on the left hemidiaphragm. Xeloda continued 7 days on/7 days off 11/18/2022  2.  Port-A-Cath placement-Dr. 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  Vertigo April 2024 5.  MRI brain 07/20/2022-no acute or reversible finding.  No evidence of metastatic disease.  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  Disposition: Craig Best appears stable.  He continues Xeloda 7 days on/7 days off.  He is tolerating well.  CEA is mildly higher.  We will continue to monitor.  CBC and chemistry panel reviewed.  Labs adequate to continue Xeloda as above.  He will return for follow-up and treatment in 4 weeks.  We are available to see him sooner if needed.  Craig Best ANP/GNP-BC   12/14/2022  11:33 AM

## 2022-12-16 ENCOUNTER — Other Ambulatory Visit: Payer: Self-pay

## 2022-12-20 ENCOUNTER — Other Ambulatory Visit (HOSPITAL_COMMUNITY): Payer: Self-pay

## 2022-12-24 ENCOUNTER — Other Ambulatory Visit: Payer: Self-pay

## 2022-12-29 ENCOUNTER — Other Ambulatory Visit: Payer: Self-pay

## 2022-12-29 NOTE — Progress Notes (Signed)
Specialty Pharmacy Refill Coordination Note  Zaim Pasquino is a 82 y.o. male contacted today regarding refills of specialty medication(s) Capecitabine   Patient requested Delivery   Delivery date: 12/31/22   Verified address: [address] [place] [address]   Medication will be filled on 12/30/22.

## 2023-01-11 ENCOUNTER — Inpatient Hospital Stay: Payer: Medicare Other

## 2023-01-11 ENCOUNTER — Inpatient Hospital Stay: Payer: Medicare Other | Attending: Oncology

## 2023-01-11 ENCOUNTER — Inpatient Hospital Stay: Payer: Medicare Other | Admitting: Oncology

## 2023-01-11 ENCOUNTER — Inpatient Hospital Stay: Payer: Medicare Other | Admitting: Nurse Practitioner

## 2023-01-11 ENCOUNTER — Encounter: Payer: Self-pay | Admitting: Nurse Practitioner

## 2023-01-11 VITALS — BP 133/69 | HR 68 | Temp 98.1°F | Resp 18 | Ht 68.0 in | Wt 209.1 lb

## 2023-01-11 DIAGNOSIS — C7801 Secondary malignant neoplasm of right lung: Secondary | ICD-10-CM | POA: Insufficient documentation

## 2023-01-11 DIAGNOSIS — C7802 Secondary malignant neoplasm of left lung: Secondary | ICD-10-CM | POA: Diagnosis not present

## 2023-01-11 DIAGNOSIS — C187 Malignant neoplasm of sigmoid colon: Secondary | ICD-10-CM

## 2023-01-11 DIAGNOSIS — Z95828 Presence of other vascular implants and grafts: Secondary | ICD-10-CM

## 2023-01-11 LAB — CMP (CANCER CENTER ONLY)
ALT: 17 U/L (ref 0–44)
AST: 17 U/L (ref 15–41)
Albumin: 4 g/dL (ref 3.5–5.0)
Alkaline Phosphatase: 68 U/L (ref 38–126)
Anion gap: 7 (ref 5–15)
BUN: 16 mg/dL (ref 8–23)
CO2: 28 mmol/L (ref 22–32)
Calcium: 8.9 mg/dL (ref 8.9–10.3)
Chloride: 101 mmol/L (ref 98–111)
Creatinine: 0.85 mg/dL (ref 0.61–1.24)
GFR, Estimated: >60 mL/min
Glucose, Bld: 99 mg/dL (ref 70–99)
Potassium: 4 mmol/L (ref 3.5–5.1)
Sodium: 136 mmol/L (ref 135–145)
Total Bilirubin: 0.4 mg/dL
Total Protein: 7.1 g/dL (ref 6.5–8.1)

## 2023-01-11 LAB — CBC WITH DIFFERENTIAL (CANCER CENTER ONLY)
Abs Immature Granulocytes: 0.02 10*3/uL (ref 0.00–0.07)
Basophils Absolute: 0.2 10*3/uL — ABNORMAL HIGH (ref 0.0–0.1)
Basophils Relative: 2 %
Eosinophils Absolute: 0.3 10*3/uL (ref 0.0–0.5)
Eosinophils Relative: 4 %
HCT: 45.3 % (ref 39.0–52.0)
Hemoglobin: 15.1 g/dL (ref 13.0–17.0)
Immature Granulocytes: 0 %
Lymphocytes Relative: 24 %
Lymphs Abs: 2.2 10*3/uL (ref 0.7–4.0)
MCH: 30.6 pg (ref 26.0–34.0)
MCHC: 33.3 g/dL (ref 30.0–36.0)
MCV: 91.7 fL (ref 80.0–100.0)
Monocytes Absolute: 0.7 10*3/uL (ref 0.1–1.0)
Monocytes Relative: 8 %
Neutro Abs: 5.6 10*3/uL (ref 1.7–7.7)
Neutrophils Relative %: 62 %
Platelet Count: 329 10*3/uL (ref 150–400)
RBC: 4.94 MIL/uL (ref 4.22–5.81)
RDW: 14.3 % (ref 11.5–15.5)
WBC Count: 9 10*3/uL (ref 4.0–10.5)
nRBC: 0 % (ref 0.0–0.2)

## 2023-01-11 LAB — CEA (ACCESS): CEA (CHCC): 41.46 ng/mL — ABNORMAL HIGH (ref 0.00–5.00)

## 2023-01-11 MED ORDER — HEPARIN SOD (PORK) LOCK FLUSH 100 UNIT/ML IV SOLN
500.0000 [IU] | Freq: Once | INTRAVENOUS | Status: AC
  Administered 2023-01-11: 500 [IU] via INTRAVENOUS

## 2023-01-11 MED ORDER — SODIUM CHLORIDE 0.9% FLUSH
10.0000 mL | Freq: Once | INTRAVENOUS | Status: AC
  Administered 2023-01-11: 10 mL via INTRAVENOUS

## 2023-01-11 NOTE — Progress Notes (Signed)
[facility] OFFICE PROGRESS NOTE   Diagnosis: Colon cancer  INTERVAL HISTORY:   Craig Best returns as scheduled.  He continues Xeloda 7 days on/7 days off.  He feels well.  He denies nausea/vomiting.  No mouth sores.  No diarrhea.  No hand or foot pain or redness.  No abdominal pain.  He has a good appetite.  Objective:  Vital signs in last 24 hours:  Blood pressure 133/69, pulse 68, temperature 98.1 F (36.7 C), temperature source Oral, resp. rate 18, height 5\' 8"  (1.727 m), weight 209 lb 1.6 oz (94.8 kg), SpO2 100%.    HEENT: No thrush or ulcers. Resp: Lungs clear bilaterally. Cardio: Regular rate and rhythm. GI: No hepatosplenomegaly. Vascular: No leg edema.  Skin: Palms without erythema. Port-A-Cath without erythema.  Lab Results:  Lab Results  Component Value Date   WBC 9.0 01/11/2023   HGB 15.1 01/11/2023   HCT 45.3 01/11/2023   MCV 91.7 01/11/2023   PLT 329 01/11/2023   NEUTROABS 5.6 01/11/2023    Imaging:  No results found.  Medications: I have reviewed the patient's current medications.  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), status post a sigmoid colectomy and partial bladder resection 09/19/2020 2 separate sigmoid colon tumors, 2.5 cm apart, 1/13 lymph nodes positive, tumor extends to adherent soft tissue of the bladder, MSS, normal mismatch repair protein expression tumor mutation burden 0, K-ras G12D CT abdomen/pelvis 09/19/2020-sigmoid colon mass with colonic obstruction, 6 mm right lower lobe nodule CT chest 09/22/2020-multiple bilateral lung nodules consistent with metastases Elevated preoperative CEA Cycle 1 FOLFOX 10/28/2020 Cycle 2 FOLFOX 11/11/2020 Cycle 3 FOLFOX 11/25/2020 Cycle 4 FOLFOX 12/09/2020 Cycle 5 FOLFOX 12/23/2020 CT chest 12/25/2020-unchanged lung nodules, 2 cm subpleural nodule in the left posterior chest more prominent-loculated fluid? Cycle 6 FOLFOX 02/03/2021-5-FU dose reduced and bolus  eliminated secondary to diarrhea Cycle 7 FOLFOX 02/17/2021 Cycle 8 FOLFOX 03/03/2021 PET 03/17/2021 pulmonary nodules without significant change, 10 mm nodule in right middle lobe with mild FDG uptake, mildly hypermetabolic pleural nodule in the lower left hemothorax, new clustered nodularity in the apical and posterior right upper lobe-likely infectious CTs 07/23/2021-new left pleural effusion with multiple pleural nodules, increased size of several pulmonary nodules, stable left paracolic gutter nodule, morphologic features suggestive of early cirrhosis Thoracentesis 07/24/2021-1200 cc of fluid removed, rare atypical cells present Thoracentesis 627 2023-1.5 L of fluid removed, atypical cells present, acute inflammation and blood Thoracentesis 08/14/2021- 1.2 L of fluid removed, adenocarcinoma compatible with metastatic colon cancer Thoracentesis 08/21/2021-adenocarcinoma Cycle 1 FOLFIRI/bevacizumab 09/01/2021 Cycle 2 FOLFIRI/bevacizumab 09/15/2021 Cycle 3 FOLFIRI/bevacizumab 09/29/2021-Decadron prophylaxis added for delayed nausea Cycle 4 FOLFIRI/bevacizumab 10/13/2021 Cycle 5 FOLFIRI/bevacizumab 10/27/2021 CT chest 10/28/2021-improvement in left pleural effusion, rule/parenchymal metastases-some are slightly smaller, no new lesions Cycle 6 FOLFIRI/bevacizumab 11/10/2021 Cycle 7 FOLFIRI/bevacizumab 12/01/2021 Cycle 8 FOLFIRI/bevacizumab 12/22/2021 CT chest 01/07/2022-stable pulmonary parenchymal and pleural metastatic disease. Cycle 9 FOLFIRI/bevacizumab 02/09/2022 03/02/2022 treatment discontinued due to poor tolerance Xeloda 7 days on/7 days off beginning 03/08/2022, Avastin continued every 3 weeks CT chest 03/09/2022-no significant change in appearance of bilateral pulmonary metastasis, trace left pleural effusion, nodular liver contour Xeloda 7 days on/7 days off and Avastin every 3 weeks CT chest 06/01/2022-mild enlargement of several pulmonary metastases, trace left pleural effusion Xeloda 7 days on/7 days  off and Avastin every 3 weeks 06/17/2022 Xeloda resume 7 days on/7 days off 08/03/2022, declines Avastin CTs 08/19/2022-pleural lung nodules appear to be slightly diminished in size, pulmonary parenchymal nodules appear to be slightly enlarged,  overall little change.  No evidence of lymphadenopathy or metastatic disease in the abdomen or pelvis. Xeloda continued 7 days on/7 days off 08/24/2022 Xeloda dose reduced to 2 tablets twice daily 7 days on/7 days off beginning 10/21/2022 due to mucositis CTs 11/11/2022-pulmonary nodules have enlarged slightly from 08/19/2022.  Pleural nodularity thickening and fluid in the left lower hemithorax is similar with slight mass effect on the left hemidiaphragm. Xeloda continued 7 days on/7 days off 11/18/2022  2.  Port-A-Cath placement-Dr. 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  Vertigo April 2024 5.  MRI brain 07/20/2022-no acute or reversible finding.  No evidence of metastatic disease.  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  Disposition: Craig Best appears stable.  He continues Xeloda 7 days on/7 days off.  He has a good performance status.  He is tolerating Xeloda well.  CEA has increased over the past few months.  Plan for restaging chest CT prior to his next office visit.  CBC and chemistry panel reviewed.  Labs adequate to continue Xeloda as above.  He will return for follow-up in 4 weeks.  He will contact the office in the interim with any problems.    Lonna Cobb ANP/GNP-BC   01/11/2023  2:30 PM

## 2023-01-13 ENCOUNTER — Other Ambulatory Visit: Payer: Self-pay

## 2023-01-13 ENCOUNTER — Telehealth (HOSPITAL_BASED_OUTPATIENT_CLINIC_OR_DEPARTMENT_OTHER): Payer: Self-pay | Admitting: Pulmonary Disease

## 2023-01-17 ENCOUNTER — Other Ambulatory Visit: Payer: Self-pay

## 2023-01-18 ENCOUNTER — Other Ambulatory Visit: Payer: Self-pay

## 2023-01-24 ENCOUNTER — Other Ambulatory Visit: Payer: Self-pay

## 2023-02-03 ENCOUNTER — Ambulatory Visit (HOSPITAL_BASED_OUTPATIENT_CLINIC_OR_DEPARTMENT_OTHER)
Admission: RE | Admit: 2023-02-03 | Discharge: 2023-02-03 | Disposition: A | Discharge status: Home / Self Care | Payer: Medicare Other | Source: Ambulatory Visit | Attending: Nurse Practitioner | Admitting: Nurse Practitioner

## 2023-02-03 ENCOUNTER — Encounter (HOSPITAL_BASED_OUTPATIENT_CLINIC_OR_DEPARTMENT_OTHER): Payer: Self-pay

## 2023-02-03 DIAGNOSIS — C19 Malignant neoplasm of rectosigmoid junction: Secondary | ICD-10-CM | POA: Diagnosis not present

## 2023-02-03 DIAGNOSIS — C187 Malignant neoplasm of sigmoid colon: Secondary | ICD-10-CM | POA: Diagnosis not present

## 2023-02-03 DIAGNOSIS — Z933 Colostomy status: Secondary | ICD-10-CM | POA: Diagnosis not present

## 2023-02-03 DIAGNOSIS — K828 Other specified diseases of gallbladder: Secondary | ICD-10-CM | POA: Diagnosis not present

## 2023-02-03 MED ORDER — IOHEXOL 300 MG/ML  SOLN
100.0000 mL | Freq: Once | INTRAMUSCULAR | Status: AC | PRN
  Administered 2023-02-03: 85 mL via INTRAVENOUS

## 2023-02-10 ENCOUNTER — Inpatient Hospital Stay: Payer: Medicare Other

## 2023-02-10 ENCOUNTER — Inpatient Hospital Stay: Payer: Medicare Other | Attending: Oncology

## 2023-02-10 ENCOUNTER — Other Ambulatory Visit (HOSPITAL_COMMUNITY): Payer: Self-pay

## 2023-02-10 ENCOUNTER — Inpatient Hospital Stay: Payer: Medicare Other | Admitting: Oncology

## 2023-02-10 VITALS — BP 138/57 | HR 62 | Temp 98.1°F | Resp 18 | Ht 68.0 in | Wt 208.0 lb

## 2023-02-10 DIAGNOSIS — C7801 Secondary malignant neoplasm of right lung: Secondary | ICD-10-CM | POA: Diagnosis not present

## 2023-02-10 DIAGNOSIS — Z5112 Encounter for antineoplastic immunotherapy: Secondary | ICD-10-CM | POA: Diagnosis not present

## 2023-02-10 DIAGNOSIS — C187 Malignant neoplasm of sigmoid colon: Secondary | ICD-10-CM | POA: Diagnosis not present

## 2023-02-10 DIAGNOSIS — C7802 Secondary malignant neoplasm of left lung: Secondary | ICD-10-CM | POA: Diagnosis not present

## 2023-02-10 DIAGNOSIS — Z95828 Presence of other vascular implants and grafts: Secondary | ICD-10-CM

## 2023-02-10 LAB — CBC WITH DIFFERENTIAL (CANCER CENTER ONLY)
Abs Immature Granulocytes: 0.03 10*3/uL (ref 0.00–0.07)
Basophils Absolute: 0.1 10*3/uL (ref 0.0–0.1)
Basophils Relative: 2 %
Eosinophils Absolute: 0.4 10*3/uL (ref 0.0–0.5)
Eosinophils Relative: 5 %
HCT: 45.4 % (ref 39.0–52.0)
Hemoglobin: 15.4 g/dL (ref 13.0–17.0)
Immature Granulocytes: 0 %
Lymphocytes Relative: 24 %
Lymphs Abs: 1.9 10*3/uL (ref 0.7–4.0)
MCH: 30.9 pg (ref 26.0–34.0)
MCHC: 33.9 g/dL (ref 30.0–36.0)
MCV: 91.2 fL (ref 80.0–100.0)
Monocytes Absolute: 0.7 10*3/uL (ref 0.1–1.0)
Monocytes Relative: 8 %
Neutro Abs: 4.8 10*3/uL (ref 1.7–7.7)
Neutrophils Relative %: 61 %
Platelet Count: 297 10*3/uL (ref 150–400)
RBC: 4.98 MIL/uL (ref 4.22–5.81)
RDW: 14.6 % (ref 11.5–15.5)
WBC Count: 7.9 10*3/uL (ref 4.0–10.5)
nRBC: 0 % (ref 0.0–0.2)

## 2023-02-10 LAB — CMP (CANCER CENTER ONLY)
ALT: 13 U/L (ref 0–44)
AST: 14 U/L — ABNORMAL LOW (ref 15–41)
Albumin: 4.1 g/dL (ref 3.5–5.0)
Alkaline Phosphatase: 73 U/L (ref 38–126)
Anion gap: 7 (ref 5–15)
BUN: 18 mg/dL (ref 8–23)
CO2: 29 mmol/L (ref 22–32)
Calcium: 8.9 mg/dL (ref 8.9–10.3)
Chloride: 101 mmol/L (ref 98–111)
Creatinine: 0.83 mg/dL (ref 0.61–1.24)
GFR, Estimated: >60 mL/min (ref >60)
Glucose, Bld: 113 mg/dL — ABNORMAL HIGH (ref 70–99)
Potassium: 3.8 mmol/L (ref 3.5–5.1)
Sodium: 137 mmol/L (ref 135–145)
Total Bilirubin: 0.5 mg/dL (ref 0.0–1.2)
Total Protein: 7.7 g/dL (ref 6.5–8.1)

## 2023-02-10 LAB — CEA (ACCESS): CEA (CHCC): 44.81 ng/mL — ABNORMAL HIGH (ref 0.00–5.00)

## 2023-02-10 MED ORDER — SODIUM CHLORIDE 0.9% FLUSH
10.0000 mL | INTRAVENOUS | Status: DC | PRN
  Administered 2023-02-10: 10 mL via INTRAVENOUS

## 2023-02-10 MED ORDER — HEPARIN SOD (PORK) LOCK FLUSH 100 UNIT/ML IV SOLN
500.0000 [IU] | Freq: Once | INTRAVENOUS | Status: AC
  Administered 2023-02-10: 500 [IU] via INTRAVENOUS

## 2023-02-10 NOTE — Progress Notes (Signed)
 [facility] OFFICE PROGRESS NOTE   Diagnosis: Colon cancer  INTERVAL HISTORY:   Mr. Kamer returns as scheduled.  He continues Xeloda .  No mouth sores or diarrhea.  He has developed erythema at the dorsum of the feet.  He generally feels well.  He is working.  No dyspnea.  Mild headache while on Xeloda .  Objective:  Vital signs in last 24 hours:  Blood pressure (!) 138/57, pulse 62, temperature 98.1 F (36.7 C), temperature source Temporal, resp. rate 18, height 5' 8 (1.727 m), weight 208 lb (94.3 kg), SpO2 100%.    HEENT: No thrush or ulcers Lymphatics: No cervical, supraclavicular, axillary, or inguinal nodes Resp: Lungs clear bilaterally Cardio: Regular rate and rhythm GI: No hepatosplenomegaly, no mass, nontender Vascular: No leg edema  Skin: Mild erythema at the dorsum of the feet, palms and soles without erythema.  Dryness and callus formation at the soles  Portacath/PICC-without erythema  Lab Results:  Lab Results  Component Value Date   WBC 7.9 02/10/2023   HGB 15.4 02/10/2023   HCT 45.4 02/10/2023   MCV 91.2 02/10/2023   PLT 297 02/10/2023   NEUTROABS 4.8 02/10/2023    CMP  Lab Results  Component Value Date   NA 137 02/10/2023   K 3.8 02/10/2023   CL 101 02/10/2023   CO2 29 02/10/2023   GLUCOSE 113 (H) 02/10/2023   BUN 18 02/10/2023   CREATININE 0.83 02/10/2023   CALCIUM  8.9 02/10/2023   PROT 7.7 02/10/2023   ALBUMIN 4.1 02/10/2023   AST 14 (L) 02/10/2023   ALT 13 02/10/2023   ALKPHOS 73 02/10/2023   BILITOT 0.5 02/10/2023   GFRNONAA >60 02/10/2023    Lab Results  Component Value Date   CEA1 63.2 (H) 09/19/2020   CEA 41.46 (H) 01/11/2023     Medications: I have reviewed the patient's current medications.   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), status post a sigmoid colectomy and partial bladder resection 09/19/2020 2 separate sigmoid colon tumors, 2.5 cm apart, 1/13 lymph nodes positive, tumor  extends to adherent soft tissue of the bladder, MSS, normal mismatch repair protein expression tumor mutation burden 0, K-ras G12D CT abdomen/pelvis 09/19/2020-sigmoid colon mass with colonic obstruction, 6 mm right lower lobe nodule CT chest 09/22/2020-multiple bilateral lung nodules consistent with metastases Elevated preoperative CEA Cycle 1 FOLFOX 10/28/2020 Cycle 2 FOLFOX 11/11/2020 Cycle 3 FOLFOX 11/25/2020 Cycle 4 FOLFOX 12/09/2020 Cycle 5 FOLFOX 12/23/2020 CT chest 12/25/2020-unchanged lung nodules, 2 cm subpleural nodule in the left posterior chest more prominent-loculated fluid? Cycle 6 FOLFOX 02/03/2021-5-FU dose reduced and bolus eliminated secondary to diarrhea Cycle 7 FOLFOX 02/17/2021 Cycle 8 FOLFOX 03/03/2021 PET 03/17/2021 pulmonary nodules without significant change, 10 mm nodule in right middle lobe with mild FDG uptake, mildly hypermetabolic pleural nodule in the lower left hemothorax, new clustered nodularity in the apical and posterior right upper lobe-likely infectious CTs 07/23/2021-new left pleural effusion with multiple pleural nodules, increased size of several pulmonary nodules, stable left paracolic gutter nodule, morphologic features suggestive of early cirrhosis Thoracentesis 07/24/2021-1200 cc of fluid removed, rare atypical cells present Thoracentesis 627 2023-1.5 L of fluid removed, atypical cells present, acute inflammation and blood Thoracentesis 08/14/2021- 1.2 L of fluid removed, adenocarcinoma compatible with metastatic colon cancer Thoracentesis 08/21/2021-adenocarcinoma Cycle 1 FOLFIRI/bevacizumab  09/01/2021 Cycle 2 FOLFIRI/bevacizumab  09/15/2021 Cycle 3 FOLFIRI/bevacizumab  09/29/2021-Decadron  prophylaxis added for delayed nausea Cycle 4 FOLFIRI/bevacizumab  10/13/2021 Cycle 5 FOLFIRI/bevacizumab  10/27/2021 CT chest 10/28/2021-improvement in left pleural effusion, rule/parenchymal metastases-some are slightly smaller,  no new lesions Cycle 6 FOLFIRI/bevacizumab   11/10/2021 Cycle 7 FOLFIRI/bevacizumab  12/01/2021 Cycle 8 FOLFIRI/bevacizumab  12/22/2021 CT chest 01/07/2022-stable pulmonary parenchymal and pleural metastatic disease. Cycle 9 FOLFIRI/bevacizumab  02/09/2022 03/02/2022 treatment discontinued due to poor tolerance Xeloda  7 days on/7 days off beginning 03/08/2022, Avastin  continued every 3 weeks CT chest 03/09/2022-no significant change in appearance of bilateral pulmonary metastasis, trace left pleural effusion, nodular liver contour Xeloda  7 days on/7 days off and Avastin  every 3 weeks CT chest 06/01/2022-mild enlargement of several pulmonary metastases, trace left pleural effusion Xeloda  7 days on/7 days off and Avastin  every 3 weeks 06/17/2022 Xeloda  resume 7 days on/7 days off 08/03/2022, declines Avastin  CTs 08/19/2022-pleural lung nodules appear to be slightly diminished in size, pulmonary parenchymal nodules appear to be slightly enlarged, overall little change.  No evidence of lymphadenopathy or metastatic disease in the abdomen or pelvis. Xeloda  continued 7 days on/7 days off 08/24/2022 Xeloda  dose reduced to 2 tablets twice daily 7 days on/7 days off beginning 10/21/2022 due to mucositis CTs 11/11/2022-pulmonary nodules have enlarged slightly from 08/19/2022.  Pleural nodularity thickening and fluid in the left lower hemithorax is similar with slight mass effect on the left hemidiaphragm. Xeloda  continued 7 days on/7 days off 11/18/2022 CTs 02/03/2023: No change in pulmonary nodules, nodular densities overlying the bladder dome have enlarged Avastin  resumed 02/15/2023  2.  Port-A-Cath placement-Dr. 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  Vertigo April 2024 5.  MRI brain 07/20/2022-no acute or reversible finding.  No evidence of metastatic disease.  Age-related volume loss and mild chronic small vessel ischemic change of the cerebral hemispheric white  matter.   Disposition: Mr Berenson has metastatic colon cancer.  I reviewed the CT findings and images with him.  We measured multiple lung lesions and a generally appear unchanged.  The lesions adjacent to the bladder dome have been present on at least the last few CTs and are slightly larger.  The CEA has been higher over the past 2 months.  We discussed treatment options including continuation of Xeloda  with resumption of bevacizumab , and switching back to FOLFIRI.  He prefers continuing Xeloda  on the current schedule and resuming bevacizumab .  He will be scheduled for bevacizumab  on 02/15/2023.  Mr. Chevalier will return for an office visit and bevacizumab  on 03/08/2023.  Arley Hof, MD  02/10/2023  9:41 AM

## 2023-02-10 NOTE — Patient Instructions (Signed)

## 2023-02-11 ENCOUNTER — Other Ambulatory Visit: Payer: Self-pay | Admitting: *Deleted

## 2023-02-11 ENCOUNTER — Other Ambulatory Visit: Payer: Self-pay

## 2023-02-13 ENCOUNTER — Other Ambulatory Visit: Payer: Self-pay

## 2023-02-14 ENCOUNTER — Other Ambulatory Visit (HOSPITAL_COMMUNITY): Payer: Self-pay

## 2023-02-14 ENCOUNTER — Encounter (HOSPITAL_COMMUNITY): Payer: Self-pay

## 2023-02-15 ENCOUNTER — Inpatient Hospital Stay: Payer: Medicare Other

## 2023-02-15 ENCOUNTER — Other Ambulatory Visit: Payer: Medicare Other

## 2023-02-15 VITALS — BP 125/63 | HR 60 | Temp 98.8°F | Resp 18 | Ht 68.0 in | Wt 207.2 lb

## 2023-02-15 DIAGNOSIS — C187 Malignant neoplasm of sigmoid colon: Secondary | ICD-10-CM

## 2023-02-15 DIAGNOSIS — Z5112 Encounter for antineoplastic immunotherapy: Secondary | ICD-10-CM | POA: Diagnosis not present

## 2023-02-15 DIAGNOSIS — C7802 Secondary malignant neoplasm of left lung: Secondary | ICD-10-CM | POA: Diagnosis not present

## 2023-02-15 DIAGNOSIS — C7801 Secondary malignant neoplasm of right lung: Secondary | ICD-10-CM | POA: Diagnosis not present

## 2023-02-15 LAB — TOTAL PROTEIN, URINE DIPSTICK: Protein, ur: NEGATIVE mg/dL

## 2023-02-15 MED ORDER — SODIUM CHLORIDE 0.9% FLUSH
10.0000 mL | INTRAVENOUS | Status: DC | PRN
  Administered 2023-02-15: 10 mL

## 2023-02-15 MED ORDER — HEPARIN SOD (PORK) LOCK FLUSH 100 UNIT/ML IV SOLN
500.0000 [IU] | Freq: Once | INTRAVENOUS | Status: AC | PRN
  Administered 2023-02-15: 500 [IU]

## 2023-02-15 MED ORDER — BEVACIZUMAB-BVZR CHEMO INJECTION 400 MG/16ML
7.5000 mg/kg | Freq: Once | INTRAVENOUS | Status: AC
  Administered 2023-02-15: 700 mg via INTRAVENOUS
  Filled 2023-02-15: qty 16

## 2023-02-15 MED ORDER — SODIUM CHLORIDE 0.9 % IV SOLN: Freq: Once | INTRAVENOUS | Status: AC

## 2023-02-15 NOTE — Patient Instructions (Signed)
 [facility]  Discharge Instructions: Thank you for choosing [facility] to provide your oncology and hematology care.   If you have a lab appointment with the Cancer Center, please go directly to the Cancer Center and check in at the registration area.   Wear comfortable clothing and clothing appropriate for easy access to any Portacath or PICC line.   We strive to give you quality time with your provider. You may need to reschedule your appointment if you arrive late (15 or more minutes).  Arriving late affects you and other patients whose appointments are after yours.  Also, if you miss three or more appointments without notifying the office, you may be dismissed from the clinic at the provider's discretion.      For prescription refill requests, have your pharmacy contact our office and allow 72 hours for refills to be completed.    Today you received the following chemotherapy and/or immunotherapy agents bevacizumab   To help prevent nausea and vomiting after your treatment, we encourage you to take your nausea medication as directed.  BELOW ARE SYMPTOMS THAT SHOULD BE REPORTED IMMEDIATELY: *FEVER GREATER THAN 100.4 F (38 C) OR HIGHER *CHILLS OR SWEATING *NAUSEA AND VOMITING THAT IS NOT CONTROLLED WITH YOUR NAUSEA MEDICATION *UNUSUAL SHORTNESS OF BREATH *UNUSUAL BRUISING OR BLEEDING *URINARY PROBLEMS (pain or burning when urinating, or frequent urination) *BOWEL PROBLEMS (unusual diarrhea, constipation, pain near the anus) TENDERNESS IN MOUTH AND THROAT WITH OR WITHOUT PRESENCE OF ULCERS (sore throat, sores in mouth, or a toothache) UNUSUAL RASH, SWELLING OR PAIN  UNUSUAL VAGINAL DISCHARGE OR ITCHING   Items with * indicate a potential emergency and should be followed up as soon as possible or go to the Emergency Department if any problems should occur.  Please show the CHEMOTHERAPY ALERT CARD or IMMUNOTHERAPY  ALERT CARD at check-in to the Emergency Department and triage nurse.  Should you have questions after your visit or need to cancel or reschedule your appointment, please contact [facility]  Dept: [phone]  and follow the prompts.  Office hours are 8:00 a.m. to 4:30 p.m. Monday - Friday. Please note that voicemails left after 4:00 p.m. may not be returned until the following business day.  We are closed weekends and major holidays. You have access to a nurse at all times for urgent questions. Please call the main number to the clinic Dept: [phone] and follow the prompts.   For any non-urgent questions, you may also contact your provider using MyChart. We now offer e-Visits for anyone 37 and older to request care online for non-urgent symptoms. For details visit [email or web address].   Also download the MyChart app! Go to the app store, search "MyChart", open the app, select [place], and log in with your MyChart username and password.  Bevacizumab Injection What is this medication? BEVACIZUMAB (be va SIZ yoo mab) treats some types of cancer. It works by blocking a protein that causes cancer cells to grow and multiply. This helps to slow or stop the spread of cancer cells. It is a monoclonal antibody. This medicine may be used for other purposes; ask your health care provider or pharmacist if you have questions. COMMON BRAND NAME(S): Alymsys, Avastin, MVASI, Omer Jack What should I tell my care team before I take this medication? They need to know if you have any of these conditions: Blood clots Coughing  up blood Having or recent surgery Heart failure High blood pressure History of a connection between 2 or more body parts that do not usually connect (fistula) History of a tear in your stomach or intestines Protein in your urine An unusual or allergic reaction to bevacizumab, other medications, foods, dyes, or  preservatives Pregnant or trying to get pregnant Breast-feeding How should I use this medication? This medication is injected into a vein. It is given by your care team in a hospital or clinic setting. Talk to your care team the use of this medication in children. Special care may be needed. Overdosage: If you think you have taken too much of this medicine contact a poison control center or emergency room at once. NOTE: This medicine is only for you. Do not share this medicine with others. What if I miss a dose? Keep appointments for follow-up doses. It is important not to miss your dose. Call your care team if you are unable to keep an appointment. What may interact with this medication? Interactions are not expected. This list may not describe all possible interactions. Give your health care provider a list of all the medicines, herbs, non-prescription drugs, or dietary supplements you use. Also tell them if you smoke, drink alcohol, or use illegal drugs. Some items may interact with your medicine. What should I watch for while using this medication? Your condition will be monitored carefully while you are receiving this medication. You may need blood work while taking this medication. This medication may make you feel generally unwell. This is not uncommon as chemotherapy can affect healthy cells as well as cancer cells. Report any side effects. Continue your course of treatment even though you feel ill unless your care team tells you to stop. This medication may increase your risk to bruise or bleed. Call your care team if you notice any unusual bleeding. Before having surgery, talk to your care team to make sure it is ok. This medication can increase the risk of poor healing of your surgical site or wound. You will need to stop this medication for 28 days before surgery. After surgery, wait at least 28 days before restarting this medication. Make sure the surgical site or wound is healed enough  before restarting this medication. Talk to your care team if questions. Talk to your care team if you may be pregnant. Serious birth defects can occur if you take this medication during pregnancy and for 6 months after the last dose. Contraception is recommended while taking this medication and for 6 months after the last dose. Your care team can help you find the option that works for you. Do not breastfeed while taking this medication and for 6 months after the last dose. This medication can cause infertility. Talk to your care team if you are concerned about your fertility. What side effects may I notice from receiving this medication? Side effects that you should report to your care team as soon as possible: Allergic reactions--skin rash, itching, hives, swelling of the face, lips, tongue, or throat Bleeding--bloody or black, tar-like stools, vomiting blood or brown material that looks like coffee grounds, red or dark brown urine, small red or purple spots on skin, unusual bruising or bleeding Blood clot--pain, swelling, or warmth in the leg, shortness of breath, chest pain Heart attack--pain or tightness in the chest, shoulders, arms, or jaw, nausea, shortness of breath, cold or clammy skin, feeling faint or lightheaded Heart failure--shortness of breath, swelling of the ankles, feet, or  hands, sudden weight gain, unusual weakness or fatigue Increase in blood pressure Infection--fever, chills, cough, sore throat, wounds that don't heal, pain or trouble when passing urine, general feeling of discomfort or being unwell Infusion reactions--chest pain, shortness of breath or trouble breathing, feeling faint or lightheaded Kidney injury--decrease in the amount of urine, swelling of the ankles, hands, or feet Stomach pain that is severe, does not go away, or gets worse Stroke--sudden numbness or weakness of the face, arm, or leg, trouble speaking, confusion, trouble walking, loss of balance or  coordination, dizziness, severe headache, change in vision Sudden and severe headache, confusion, change in vision, seizures, which may be signs of posterior reversible encephalopathy syndrome (PRES) Side effects that usually do not require medical attention (report to your care team if they continue or are bothersome): Back pain Change in taste Diarrhea Dry skin Increased tears Nosebleed This list may not describe all possible side effects. Call your doctor for medical advice about side effects. You may report side effects to FDA at 1-800-FDA-1088. Where should I keep my medication? This medication is given in a hospital or clinic. It will not be stored at home. NOTE: This sheet is a summary. It may not cover all possible information. If you have questions about this medicine, talk to your doctor, pharmacist, or health care provider.  2024 Elsevier/Gold Standard (2021-06-12 00:00:00)

## 2023-02-15 NOTE — Patient Instructions (Signed)

## 2023-02-16 ENCOUNTER — Other Ambulatory Visit: Payer: Self-pay

## 2023-02-16 ENCOUNTER — Other Ambulatory Visit (HOSPITAL_COMMUNITY): Payer: Self-pay

## 2023-02-16 ENCOUNTER — Other Ambulatory Visit: Payer: Self-pay | Admitting: Nurse Practitioner

## 2023-02-16 DIAGNOSIS — C187 Malignant neoplasm of sigmoid colon: Secondary | ICD-10-CM

## 2023-02-16 MED ORDER — CAPECITABINE 500 MG PO TABS
1000.0000 mg | ORAL_TABLET | Freq: Two times a day (BID) | ORAL | 1 refills | Status: DC
  Filled 2023-02-16: qty 56, 14d supply, fill #0
  Filled 2023-03-25: qty 56, 14d supply, fill #1

## 2023-02-16 NOTE — Progress Notes (Signed)
 Pending Refill Request completed.

## 2023-02-16 NOTE — Progress Notes (Signed)
 Specialty Pharmacy Refill Coordination Note  Craig Best is a 83 y.o. male contacted today regarding refills of specialty medication(s) Capecitabine  (XELODA )   Patient requested Delivery   Delivery date: 02/25/23   Verified address: [address] [place] [address]   Medication will be filled on 02/24/23.

## 2023-02-18 ENCOUNTER — Other Ambulatory Visit: Payer: Self-pay

## 2023-02-21 ENCOUNTER — Other Ambulatory Visit: Payer: Self-pay | Admitting: *Deleted

## 2023-02-21 DIAGNOSIS — C187 Malignant neoplasm of sigmoid colon: Secondary | ICD-10-CM

## 2023-02-24 ENCOUNTER — Other Ambulatory Visit: Payer: Self-pay

## 2023-03-06 ENCOUNTER — Other Ambulatory Visit: Payer: Self-pay | Admitting: Oncology

## 2023-03-06 DIAGNOSIS — C187 Malignant neoplasm of sigmoid colon: Secondary | ICD-10-CM

## 2023-03-08 ENCOUNTER — Inpatient Hospital Stay: Payer: Medicare Other

## 2023-03-08 ENCOUNTER — Encounter: Payer: Self-pay | Admitting: Nurse Practitioner

## 2023-03-08 ENCOUNTER — Inpatient Hospital Stay: Payer: Medicare Other | Admitting: Nurse Practitioner

## 2023-03-08 VITALS — BP 145/69 | HR 56 | Resp 18

## 2023-03-08 VITALS — BP 145/68 | HR 67 | Temp 98.1°F | Resp 18 | Ht 68.0 in | Wt 208.5 lb

## 2023-03-08 DIAGNOSIS — C7801 Secondary malignant neoplasm of right lung: Secondary | ICD-10-CM | POA: Diagnosis not present

## 2023-03-08 DIAGNOSIS — C187 Malignant neoplasm of sigmoid colon: Secondary | ICD-10-CM

## 2023-03-08 DIAGNOSIS — C7802 Secondary malignant neoplasm of left lung: Secondary | ICD-10-CM | POA: Diagnosis not present

## 2023-03-08 DIAGNOSIS — Z5112 Encounter for antineoplastic immunotherapy: Secondary | ICD-10-CM | POA: Diagnosis not present

## 2023-03-08 LAB — CBC WITH DIFFERENTIAL (CANCER CENTER ONLY)
Abs Immature Granulocytes: 0.02 10*3/uL (ref 0.00–0.07)
Basophils Absolute: 0.1 10*3/uL (ref 0.0–0.1)
Basophils Relative: 2 %
Eosinophils Absolute: 0.3 10*3/uL (ref 0.0–0.5)
Eosinophils Relative: 3 %
HCT: 45.7 % (ref 39.0–52.0)
Hemoglobin: 15.3 g/dL (ref 13.0–17.0)
Immature Granulocytes: 0 %
Lymphocytes Relative: 25 %
Lymphs Abs: 1.8 10*3/uL (ref 0.7–4.0)
MCH: 30.5 pg (ref 26.0–34.0)
MCHC: 33.5 g/dL (ref 30.0–36.0)
MCV: 91 fL (ref 80.0–100.0)
Monocytes Absolute: 0.6 10*3/uL (ref 0.1–1.0)
Monocytes Relative: 8 %
Neutro Abs: 4.5 10*3/uL (ref 1.7–7.7)
Neutrophils Relative %: 62 %
Platelet Count: 291 10*3/uL (ref 150–400)
RBC: 5.02 MIL/uL (ref 4.22–5.81)
RDW: 15.5 % (ref 11.5–15.5)
WBC Count: 7.3 10*3/uL (ref 4.0–10.5)
nRBC: 0 % (ref 0.0–0.2)

## 2023-03-08 LAB — CMP (CANCER CENTER ONLY)
ALT: 12 U/L (ref 0–44)
AST: 16 U/L (ref 15–41)
Albumin: 3.7 g/dL (ref 3.5–5.0)
Alkaline Phosphatase: 70 U/L (ref 38–126)
Anion gap: 7 (ref 5–15)
BUN: 14 mg/dL (ref 8–23)
CO2: 29 mmol/L (ref 22–32)
Calcium: 8.8 mg/dL — ABNORMAL LOW (ref 8.9–10.3)
Chloride: 102 mmol/L (ref 98–111)
Creatinine: 0.8 mg/dL (ref 0.61–1.24)
GFR, Estimated: >60 mL/min (ref >60)
Glucose, Bld: 101 mg/dL — ABNORMAL HIGH (ref 70–99)
Potassium: 3.8 mmol/L (ref 3.5–5.1)
Sodium: 138 mmol/L (ref 135–145)
Total Bilirubin: 0.6 mg/dL (ref 0.0–1.2)
Total Protein: 7.5 g/dL (ref 6.5–8.1)

## 2023-03-08 LAB — TOTAL PROTEIN, URINE DIPSTICK: Protein, ur: NEGATIVE mg/dL

## 2023-03-08 LAB — CEA (ACCESS): CEA (CHCC): 47.14 ng/mL — ABNORMAL HIGH (ref 0.00–5.00)

## 2023-03-08 MED ORDER — SODIUM CHLORIDE 0.9 % IV SOLN
7.5000 mg/kg | Freq: Once | INTRAVENOUS | Status: AC
  Administered 2023-03-08: 700 mg via INTRAVENOUS
  Filled 2023-03-08: qty 16

## 2023-03-08 MED ORDER — SODIUM CHLORIDE 0.9 % IV SOLN: Freq: Once | INTRAVENOUS | Status: AC

## 2023-03-08 MED ORDER — SODIUM CHLORIDE 0.9% FLUSH
10.0000 mL | INTRAVENOUS | Status: DC | PRN
  Administered 2023-03-08: 10 mL

## 2023-03-08 MED ORDER — HEPARIN SOD (PORK) LOCK FLUSH 100 UNIT/ML IV SOLN
500.0000 [IU] | Freq: Once | INTRAVENOUS | Status: AC | PRN
  Administered 2023-03-08: 500 [IU]

## 2023-03-08 MED ORDER — LIDOCAINE-PRILOCAINE 2.5-2.5 % EX CREA: 1.0000 | TOPICAL_CREAM | CUTANEOUS | 2 refills | Status: AC | PRN

## 2023-03-08 NOTE — Progress Notes (Signed)
[facility] OFFICE PROGRESS NOTE   Diagnosis: Colon cancer  INTERVAL HISTORY:   [name] returns as scheduled.  He continues Xeloda 7 days on/7 days off.  Avastin resumed 02/15/2023.  He denies nausea/vomiting.  No mouth sores.  He noted mild peeling of his lips toward the end of the 7 days of Xeloda.  No associated discomfort.  No diarrhea.  He denies bleeding.  No symptom of thrombosis.  Single episode of belching that lasted for a few hours.  Resolved with Compazine.  Objective:  Vital signs in last 24 hours:  Blood pressure (!) 145/68, pulse 67, temperature 98.1 F (36.7 C), temperature source Temporal, resp. rate 18, height 5\' 8"  (1.727 m), weight 208 lb 8 oz (94.6 kg), SpO2 100%.    HEENT: No thrush or ulcers.  Lips without peeling. Resp: Lungs clear bilaterally. Cardio: Regular rate and rhythm. GI: No hepatosplenomegaly.  Left lower quadrant colostomy. Vascular: No leg edema. Skin: Palms without erythema.  Soles with mild erythema. Port-A-Cath without erythema.  Lab Results:  Lab Results  Component Value Date   WBC 7.3 03/08/2023   HGB 15.3 03/08/2023   HCT 45.7 03/08/2023   MCV 91.0 03/08/2023   PLT 291 03/08/2023   NEUTROABS 4.5 03/08/2023    Imaging:  No results found.  Medications: I have reviewed the patient's current medications.  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), status post a sigmoid colectomy and partial bladder resection 09/19/2020 2 separate sigmoid colon tumors, 2.5 cm apart, 1/13 lymph nodes positive, tumor extends to adherent soft tissue of the bladder, MSS, normal mismatch repair protein expression tumor mutation burden 0, K-ras G12D CT abdomen/pelvis 09/19/2020-sigmoid colon mass with colonic obstruction, 6 mm right lower lobe nodule CT chest 09/22/2020-multiple bilateral lung nodules consistent with metastases Elevated preoperative CEA Cycle 1 FOLFOX 10/28/2020 Cycle 2 FOLFOX 11/11/2020 Cycle 3 FOLFOX  11/25/2020 Cycle 4 FOLFOX 12/09/2020 Cycle 5 FOLFOX 12/23/2020 CT chest 12/25/2020-unchanged lung nodules, 2 cm subpleural nodule in the left posterior chest more prominent-loculated fluid? Cycle 6 FOLFOX 02/03/2021-5-FU dose reduced and bolus eliminated secondary to diarrhea Cycle 7 FOLFOX 02/17/2021 Cycle 8 FOLFOX 03/03/2021 PET 03/17/2021 pulmonary nodules without significant change, 10 mm nodule in right middle lobe with mild FDG uptake, mildly hypermetabolic pleural nodule in the lower left hemothorax, new clustered nodularity in the apical and posterior right upper lobe-likely infectious CTs 07/23/2021-new left pleural effusion with multiple pleural nodules, increased size of several pulmonary nodules, stable left paracolic gutter nodule, morphologic features suggestive of early cirrhosis Thoracentesis 07/24/2021-1200 cc of fluid removed, rare atypical cells present Thoracentesis 627 2023-1.5 L of fluid removed, atypical cells present, acute inflammation and blood Thoracentesis 08/14/2021- 1.2 L of fluid removed, adenocarcinoma compatible with metastatic colon cancer Thoracentesis 08/21/2021-adenocarcinoma Cycle 1 FOLFIRI/bevacizumab 09/01/2021 Cycle 2 FOLFIRI/bevacizumab 09/15/2021 Cycle 3 FOLFIRI/bevacizumab 09/29/2021-Decadron prophylaxis added for delayed nausea Cycle 4 FOLFIRI/bevacizumab 10/13/2021 Cycle 5 FOLFIRI/bevacizumab 10/27/2021 CT chest 10/28/2021-improvement in left pleural effusion, rule/parenchymal metastases-some are slightly smaller, no new lesions Cycle 6 FOLFIRI/bevacizumab 11/10/2021 Cycle 7 FOLFIRI/bevacizumab 12/01/2021 Cycle 8 FOLFIRI/bevacizumab 12/22/2021 CT chest 01/07/2022-stable pulmonary parenchymal and pleural metastatic disease. Cycle 9 FOLFIRI/bevacizumab 02/09/2022 03/02/2022 treatment discontinued due to poor tolerance Xeloda 7 days on/7 days off beginning 03/08/2022, Avastin continued every 3 weeks CT chest 03/09/2022-no significant change in appearance of bilateral  pulmonary metastasis, trace left pleural effusion, nodular liver contour Xeloda 7 days on/7 days off and Avastin every 3 weeks CT chest 06/01/2022-mild enlargement of several pulmonary metastases, trace left pleural effusion  Xeloda 7 days on/7 days off and Avastin every 3 weeks 06/17/2022 Xeloda resume 7 days on/7 days off 08/03/2022, declines Avastin CTs 08/19/2022-pleural lung nodules appear to be slightly diminished in size, pulmonary parenchymal nodules appear to be slightly enlarged, overall little change.  No evidence of lymphadenopathy or metastatic disease in the abdomen or pelvis. Xeloda continued 7 days on/7 days off 08/24/2022 Xeloda dose reduced to 2 tablets twice daily 7 days on/7 days off beginning 10/21/2022 due to mucositis CTs 11/11/2022-pulmonary nodules have enlarged slightly from 08/19/2022.  Pleural nodularity thickening and fluid in the left lower hemithorax is similar with slight mass effect on the left hemidiaphragm. Xeloda continued 7 days on/7 days off 11/18/2022 CTs 02/03/2023: No change in pulmonary nodules, nodular densities overlying the bladder dome have enlarged Avastin resumed 02/15/2023  2.  Port-A-Cath placement-Dr. 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  Vertigo April 2024 5.  MRI brain 07/20/2022-no acute or reversible finding.  No evidence of metastatic disease.  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    Disposition: [name].  He continues Xeloda 7 days on/7 days off.  Avastin was resumed on a 3-week schedule 02/15/2023.  Plan to proceed with Avastin today as scheduled.  CBC and chemistry panel reviewed.  Labs adequate to proceed as above.  He had mild peeling of his lips toward the end of a 7-day course of Xeloda.  He understands to contact the office with any mouth sores,  ulcerations.  He will return for follow-up and treatment in 3 weeks.  We are available to see him sooner if needed.    Lonna Cobb ANP/GNP-BC   03/08/2023  9:38 AM

## 2023-03-08 NOTE — Patient Instructions (Signed)
Implanted [facility] Guide An implanted port is a device that is placed under the skin. It is usually placed in the chest. The device may vary based on the need. Implanted ports can be used to give IV medicine, to take blood, or to give fluids. You may have an implanted port if: You need IV medicine that would be irritating to the small veins in your hands or arms. You need IV medicines, such as chemotherapy, for a long period of time. You need IV nutrition for a long period of time. You may have fewer limitations when using a port than you would if you used other types of long-term IVs. You will also likely be able to return to normal activities after your incision heals. An implanted port has two main parts: Reservoir. The reservoir is the part where a needle is inserted to give medicines or draw blood. The reservoir is round. After the port is placed, it appears as a small, raised area under your skin. Catheter. The catheter is a small, thin tube that connects the reservoir to a vein. Medicine that is inserted into the reservoir goes into the catheter and then into the vein. How is my port accessed? To access your port: A numbing cream may be placed on the skin over the port site. Your health care provider will put on a mask and sterile gloves. The skin over your port will be cleaned carefully with a germ-killing soap and allowed to dry. Your health care provider will gently pinch the port and insert a needle into it. Your health care provider will check for a blood return to make sure the port is in the vein and is still working (patent). If your port needs to remain accessed to get medicine continuously (constant infusion), your health care provider will place a clear bandage (dressing) over the needle site. The dressing and needle will need to be changed every week, or as told by your health care provider. What is flushing? Flushing helps keep the port working. Follow instructions from your  health care provider about how and when to flush the port. Ports are usually flushed with saline solution or a medicine called heparin. The need for flushing will depend on how the port is used: If the port is only used from time to time to give medicines or draw blood, the port may need to be flushed: Before and after medicines have been given. Before and after blood has been drawn. As part of routine maintenance. Flushing may be recommended every 4-6 weeks. If a constant infusion is running, the port may not need to be flushed. Throw away any syringes in a disposal container that is meant for sharp items (sharps container). You can buy a sharps container from a pharmacy, or you can make one by using an empty hard plastic bottle with a cover. How long will my port stay implanted? The port can stay in for as long as your health care provider thinks it is needed. When it is time for the port to come out, a surgery will be done to remove it. The surgery will be similar to the procedure that was done to put the port in. Follow these instructions at home: Caring for your port and port site Flush your port as told by your health care provider. If you need an infusion over several days, follow instructions from your health care provider about how to take care of your port site. Make sure you: Change your  dressing as told by your health care provider. Wash your hands with soap and water for at least 20 seconds before and after you change your dressing. If soap and water are not available, use alcohol-based hand sanitizer. Place any used dressings or infusion bags into a plastic bag. Throw that bag in the trash. Keep the dressing that covers the needle clean and dry. Do not get it wet. Do not use scissors or sharp objects near the infusion tubing. Keep any external tubes clamped, unless they are being used. Check your port site every day for signs of infection. Check for: Redness, swelling, or  pain. Fluid or blood. Warmth. Pus or a bad smell. Protect the skin around the port site. Avoid wearing bra straps that rub or irritate the site. Protect the skin around your port from seat belts. Place a soft pad over your chest if needed. Bathe or shower as told by your health care provider. The site may get wet as long as you are not actively receiving an infusion. General instructions  Return to your normal activities as told by your health care provider. Ask your health care provider what activities are safe for you. Carry a medical alert card or wear a medical alert bracelet at all times. This will let health care providers know that you have an implanted port in case of an emergency. Where to find more information American Cancer Society: www.cancer.org American Society of Clinical Oncology: www.cancer.net Contact a health care provider if: You have a fever or chills. You have redness, swelling, or pain at the port site. You have fluid or blood coming from your port site. Your incision feels warm to the touch. You have pus or a bad smell coming from the port site. Summary Implanted ports are usually placed in the chest for long-term IV access. Follow instructions from your health care provider about flushing the port and changing bandages (dressings). Take care of the area around your port by avoiding clothing that puts pressure on the area, and by watching for signs of infection. Protect the skin around your port from seat belts. Place a soft pad over your chest if needed. Contact a health care provider if you have a fever or you have redness, swelling, pain, fluid, or a bad smell at the port site. This information is not intended to replace advice given to you by your health care provider. Make sure you discuss any questions you have with your health care provider. Document Revised: 07/29/2020 Document Reviewed: 07/29/2020 Elsevier Patient Education  2024 [employer].

## 2023-03-08 NOTE — Progress Notes (Signed)
Patient seen by Lonna Cobb NP today  Vitals are within treatment parameters:Yes   Labs are within treatment parameters: Yes   Treatment plan has been signed: Yes   Per physician team, Patient is ready for treatment and there are NO modifications to the treatment plan.

## 2023-03-08 NOTE — Patient Instructions (Signed)
[facility] - A DEPT OF [facility]   Discharge Instructions: Thank you for choosing [facility] to provide your oncology and hematology care.   If you have a lab appointment with the Cancer Center, please go directly to the Cancer Center and check in at the registration area.   Wear comfortable clothing and clothing appropriate for easy access to any Portacath or PICC line.   We strive to give you quality time with your provider. You may need to reschedule your appointment if you arrive late (15 or more minutes).  Arriving late affects you and other patients whose appointments are after yours.  Also, if you miss three or more appointments without notifying the office, you may be dismissed from the clinic at the provider's discretion.      For prescription refill requests, have your pharmacy contact our office and allow 72 hours for refills to be completed.    Today you received the following chemotherapy and/or immunotherapy agents Bevacizumab-bvzr (ZIRABEV).      To help prevent nausea and vomiting after your treatment, we encourage you to take your nausea medication as directed.  BELOW ARE SYMPTOMS THAT SHOULD BE REPORTED IMMEDIATELY: *FEVER GREATER THAN 100.4 F (38 C) OR HIGHER *CHILLS OR SWEATING *NAUSEA AND VOMITING THAT IS NOT CONTROLLED WITH YOUR NAUSEA MEDICATION *UNUSUAL SHORTNESS OF BREATH *UNUSUAL BRUISING OR BLEEDING *URINARY PROBLEMS (pain or burning when urinating, or frequent urination) *BOWEL PROBLEMS (unusual diarrhea, constipation, pain near the anus) TENDERNESS IN MOUTH AND THROAT WITH OR WITHOUT PRESENCE OF ULCERS (sore throat, sores in mouth, or a toothache) UNUSUAL RASH, SWELLING OR PAIN  UNUSUAL VAGINAL DISCHARGE OR ITCHING   Items with * indicate a potential emergency and should be followed up as soon as possible or go to the Emergency Department if any problems should occur.  Please show the CHEMOTHERAPY ALERT  CARD or IMMUNOTHERAPY ALERT CARD at check-in to the Emergency Department and triage nurse.  Should you have questions after your visit or need to cancel or reschedule your appointment, please contact [facility] - A DEPT OF [facility]  Dept: [phone]  and follow the prompts.  Office hours are 8:00 a.m. to 4:30 p.m. Monday - Friday. Please note that voicemails left after 4:00 p.m. may not be returned until the following business day.  We are closed weekends and major holidays. You have access to a nurse at all times for urgent questions. Please call the main number to the clinic Dept: [phone] and follow the prompts.   For any non-urgent questions, you may also contact your provider using MyChart. We now offer e-Visits for anyone 42 and older to request care online for non-urgent symptoms. For details visit [email or web address].   Also download the MyChart app! Go to the app store, search "MyChart", open the app, select , and log in with your MyChart username and password.  Bevacizumab Injection What is this medication? BEVACIZUMAB (be va SIZ yoo mab) treats some types of cancer. It works by blocking a protein that causes cancer cells to grow and multiply. This helps to slow or stop the spread of cancer cells. It is a monoclonal antibody. This medicine may be used for other purposes; ask your health care provider or pharmacist if you have questions. COMMON BRAND NAME(S): Alymsys, Avastin, MVASI, Rosaland Lao What should I tell my care team before I take this medication? They need to know if you have any  of these conditions: Blood clots Coughing up blood Having or recent surgery Heart failure High blood pressure History of a connection between 2 or more body parts that do not usually connect (fistula) History of a tear in your stomach or intestines Protein in your urine An unusual or allergic reaction to bevacizumab, other medications,  foods, dyes, or preservatives Pregnant or trying to get pregnant Breast-feeding How should I use this medication? This medication is injected into a vein. It is given by your care team in a hospital or clinic setting. Talk to your care team the use of this medication in children. Special care may be needed. Overdosage: If you think you have taken too much of this medicine contact a poison control center or emergency room at once. NOTE: This medicine is only for you. Do not share this medicine with others. What if I miss a dose? Keep appointments for follow-up doses. It is important not to miss your dose. Call your care team if you are unable to keep an appointment. What may interact with this medication? Interactions are not expected. This list may not describe all possible interactions. Give your health care provider a list of all the medicines, herbs, non-prescription drugs, or dietary supplements you use. Also tell them if you smoke, drink alcohol, or use illegal drugs. Some items may interact with your medicine. What should I watch for while using this medication? Your condition will be monitored carefully while you are receiving this medication. You may need blood work while taking this medication. This medication may make you feel generally unwell. This is not uncommon as chemotherapy can affect healthy cells as well as cancer cells. Report any side effects. Continue your course of treatment even though you feel ill unless your care team tells you to stop. This medication may increase your risk to bruise or bleed. Call your care team if you notice any unusual bleeding. Before having surgery, talk to your care team to make sure it is ok. This medication can increase the risk of poor healing of your surgical site or wound. You will need to stop this medication for 28 days before surgery. After surgery, wait at least 28 days before restarting this medication. Make sure the surgical site or wound  is healed enough before restarting this medication. Talk to your care team if questions. Talk to your care team if you may be pregnant. Serious birth defects can occur if you take this medication during pregnancy and for 6 months after the last dose. Contraception is recommended while taking this medication and for 6 months after the last dose. Your care team can help you find the option that works for you. Do not breastfeed while taking this medication and for 6 months after the last dose. This medication can cause infertility. Talk to your care team if you are concerned about your fertility. What side effects may I notice from receiving this medication? Side effects that you should report to your care team as soon as possible: Allergic reactions--skin rash, itching, hives, swelling of the face, lips, tongue, or throat Bleeding--bloody or black, tar-like stools, vomiting blood or brown material that looks like coffee grounds, red or dark brown urine, small red or purple spots on skin, unusual bruising or bleeding Blood clot--pain, swelling, or warmth in the leg, shortness of breath, chest pain Heart attack--pain or tightness in the chest, shoulders, arms, or jaw, nausea, shortness of breath, cold or clammy skin, feeling faint or lightheaded Heart failure--shortness of breath,  swelling of the ankles, feet, or hands, sudden weight gain, unusual weakness or fatigue Increase in blood pressure Infection--fever, chills, cough, sore throat, wounds that don't heal, pain or trouble when passing urine, general feeling of discomfort or being unwell Infusion reactions--chest pain, shortness of breath or trouble breathing, feeling faint or lightheaded Kidney injury--decrease in the amount of urine, swelling of the ankles, hands, or feet Stomach pain that is severe, does not go away, or gets worse Stroke--sudden numbness or weakness of the face, arm, or leg, trouble speaking, confusion, trouble walking, loss of  balance or coordination, dizziness, severe headache, change in vision Sudden and severe headache, confusion, change in vision, seizures, which may be signs of posterior reversible encephalopathy syndrome (PRES) Side effects that usually do not require medical attention (report to your care team if they continue or are bothersome): Back pain Change in taste Diarrhea Dry skin Increased tears Nosebleed This list may not describe all possible side effects. Call your doctor for medical advice about side effects. You may report side effects to FDA at 1-800-FDA-1088. Where should I keep my medication? This medication is given in a hospital or clinic. It will not be stored at home. NOTE: This sheet is a summary. It may not cover all possible information. If you have questions about this medicine, talk to your doctor, pharmacist, or health care provider.  2024 Elsevier/Gold Standard (2021-06-12 00:00:00)

## 2023-03-09 ENCOUNTER — Other Ambulatory Visit: Payer: Self-pay

## 2023-03-14 ENCOUNTER — Other Ambulatory Visit: Payer: Self-pay

## 2023-03-23 ENCOUNTER — Other Ambulatory Visit (HOSPITAL_COMMUNITY): Payer: Self-pay

## 2023-03-25 ENCOUNTER — Other Ambulatory Visit (HOSPITAL_COMMUNITY): Payer: Self-pay

## 2023-03-25 ENCOUNTER — Other Ambulatory Visit: Payer: Self-pay

## 2023-03-25 NOTE — Progress Notes (Signed)
Specialty Pharmacy Refill Coordination Note  Craig Best is a 83 y.o. male contacted today regarding refills of specialty medication(s) Capecitabine (XELODA)   Patient requested Delivery   Delivery date: 03/29/23   Verified address: [address] [place] [address]   Medication will be filled on 03/28/23.

## 2023-03-25 NOTE — Progress Notes (Signed)
Specialty Pharmacy Ongoing Clinical Assessment Note  Craig Best is a 83 y.o. male who is being followed by the specialty pharmacy service for RxSp Oncology   Patient's specialty medication(s) reviewed today: Capecitabine (XELODA)   Missed doses in the last 4 weeks: 0   Patient/Caregiver did not have any additional questions or concerns.   Therapeutic benefit summary: Unable to assess   Adverse events/side effects summary: No adverse events/side effects   Patient's therapy is appropriate to: Continue    Goals Addressed             This Visit's Progress    Stabilization of disease       Patient is on track. Patient will maintain adherence         Follow up:  6 months  Bobette Mo Specialty Pharmacist

## 2023-03-26 ENCOUNTER — Other Ambulatory Visit: Payer: Self-pay | Admitting: Oncology

## 2023-03-28 ENCOUNTER — Other Ambulatory Visit: Payer: Medicare Other

## 2023-03-28 ENCOUNTER — Ambulatory Visit: Payer: Medicare Other | Admitting: Nurse Practitioner

## 2023-03-28 ENCOUNTER — Other Ambulatory Visit: Payer: Self-pay

## 2023-03-28 ENCOUNTER — Ambulatory Visit: Payer: Medicare Other

## 2023-03-29 ENCOUNTER — Encounter: Payer: Self-pay | Admitting: Nurse Practitioner

## 2023-03-29 ENCOUNTER — Inpatient Hospital Stay: Payer: Medicare Other

## 2023-03-29 ENCOUNTER — Inpatient Hospital Stay: Payer: Medicare Other | Admitting: Nurse Practitioner

## 2023-03-29 ENCOUNTER — Inpatient Hospital Stay: Payer: Medicare Other | Attending: Oncology

## 2023-03-29 VITALS — BP 135/74 | HR 64 | Temp 98.1°F | Resp 18 | Wt 207.9 lb

## 2023-03-29 VITALS — BP 160/75 | HR 61 | Resp 18

## 2023-03-29 DIAGNOSIS — Z5112 Encounter for antineoplastic immunotherapy: Secondary | ICD-10-CM | POA: Diagnosis not present

## 2023-03-29 DIAGNOSIS — C7802 Secondary malignant neoplasm of left lung: Secondary | ICD-10-CM | POA: Insufficient documentation

## 2023-03-29 DIAGNOSIS — C187 Malignant neoplasm of sigmoid colon: Secondary | ICD-10-CM | POA: Diagnosis not present

## 2023-03-29 DIAGNOSIS — C7801 Secondary malignant neoplasm of right lung: Secondary | ICD-10-CM | POA: Diagnosis not present

## 2023-03-29 LAB — CMP (CANCER CENTER ONLY)
ALT: 10 U/L (ref 0–44)
AST: 15 U/L (ref 15–41)
Albumin: 3.9 g/dL (ref 3.5–5.0)
Alkaline Phosphatase: 78 U/L (ref 38–126)
Anion gap: 6 (ref 5–15)
BUN: 18 mg/dL (ref 8–23)
CO2: 29 mmol/L (ref 22–32)
Calcium: 9.5 mg/dL (ref 8.9–10.3)
Chloride: 102 mmol/L (ref 98–111)
Creatinine: 0.84 mg/dL (ref 0.61–1.24)
GFR, Estimated: >60 mL/min (ref >60)
Glucose, Bld: 109 mg/dL — ABNORMAL HIGH (ref 70–99)
Potassium: 3.7 mmol/L (ref 3.5–5.1)
Sodium: 137 mmol/L (ref 135–145)
Total Bilirubin: 0.5 mg/dL (ref 0.0–1.2)
Total Protein: 7.8 g/dL (ref 6.5–8.1)

## 2023-03-29 LAB — CEA (ACCESS): CEA (CHCC): 47 ng/mL — ABNORMAL HIGH (ref 0.00–5.00)

## 2023-03-29 LAB — CBC WITH DIFFERENTIAL (CANCER CENTER ONLY)
Abs Immature Granulocytes: 0.02 10*3/uL (ref 0.00–0.07)
Basophils Absolute: 0.1 10*3/uL (ref 0.0–0.1)
Basophils Relative: 2 %
Eosinophils Absolute: 0.4 10*3/uL (ref 0.0–0.5)
Eosinophils Relative: 5 %
HCT: 47.2 % (ref 39.0–52.0)
Hemoglobin: 16 g/dL (ref 13.0–17.0)
Immature Granulocytes: 0 %
Lymphocytes Relative: 25 %
Lymphs Abs: 2 10*3/uL (ref 0.7–4.0)
MCH: 31.3 pg (ref 26.0–34.0)
MCHC: 33.9 g/dL (ref 30.0–36.0)
MCV: 92.4 fL (ref 80.0–100.0)
Monocytes Absolute: 0.6 10*3/uL (ref 0.1–1.0)
Monocytes Relative: 8 %
Neutro Abs: 4.8 10*3/uL (ref 1.7–7.7)
Neutrophils Relative %: 60 %
Platelet Count: 275 10*3/uL (ref 150–400)
RBC: 5.11 MIL/uL (ref 4.22–5.81)
RDW: 16 % — ABNORMAL HIGH (ref 11.5–15.5)
WBC Count: 7.9 10*3/uL (ref 4.0–10.5)
nRBC: 0 % (ref 0.0–0.2)

## 2023-03-29 LAB — TOTAL PROTEIN, URINE DIPSTICK: Protein, ur: NEGATIVE mg/dL

## 2023-03-29 MED ORDER — SODIUM CHLORIDE 0.9% FLUSH
10.0000 mL | INTRAVENOUS | Status: DC | PRN
Start: 2023-03-29 — End: 2023-03-29
  Administered 2023-03-29: 10 mL

## 2023-03-29 MED ORDER — SODIUM CHLORIDE 0.9 % IV SOLN
7.5000 mg/kg | Freq: Once | INTRAVENOUS | Status: AC
  Administered 2023-03-29: 700 mg via INTRAVENOUS
  Filled 2023-03-29: qty 16

## 2023-03-29 MED ORDER — SODIUM CHLORIDE 0.9 % IV SOLN: Freq: Once | INTRAVENOUS | Status: AC

## 2023-03-29 MED ORDER — HEPARIN SOD (PORK) LOCK FLUSH 100 UNIT/ML IV SOLN
500.0000 [IU] | Freq: Once | INTRAVENOUS | Status: AC | PRN
  Administered 2023-03-29: 500 [IU]

## 2023-03-29 NOTE — Progress Notes (Signed)
[facility] OFFICE PROGRESS NOTE   Diagnosis: Colon cancer  INTERVAL HISTORY:   [name] returns as scheduled.  He continues Xeloda 7 days on 7 days off and Avastin every 3 weeks.  He denies nausea/vomiting.  He had some gum soreness which he attributes to burning his mouth on pizza.  No ulcerations.  Minor peeling of the lower lip.  No diarrhea.  No bleeding.  No symptom of thrombosis.  Objective:  Vital signs in last 24 hours:  Blood pressure 135/74, pulse 64, temperature 98.1 F (36.7 C), temperature source Tympanic, resp. rate 18, weight 207 lb 14.4 oz (94.3 kg), SpO2 98%.    HEENT: No thrush or ulcers. Resp: Lungs clear bilaterally. Cardio: Regular rate and rhythm. GI: Abdomen soft and nontender.  No hepatomegaly.  Left lower quadrant colostomy. Vascular: No leg edema. Skin: Palms without erythema. Port-A-Cath without erythema.  Lab Results:  Lab Results  Component Value Date   WBC 7.9 03/29/2023   HGB 16.0 03/29/2023   HCT 47.2 03/29/2023   MCV 92.4 03/29/2023   PLT 275 03/29/2023   NEUTROABS 4.8 03/29/2023    Imaging:  No results found.  Medications: I have reviewed the patient's current medications.  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), status post a sigmoid colectomy and partial bladder resection 09/19/2020 2 separate sigmoid colon tumors, 2.5 cm apart, 1/13 lymph nodes positive, tumor extends to adherent soft tissue of the bladder, MSS, normal mismatch repair protein expression tumor mutation burden 0, K-ras G12D CT abdomen/pelvis 09/19/2020-sigmoid colon mass with colonic obstruction, 6 mm right lower lobe nodule CT chest 09/22/2020-multiple bilateral lung nodules consistent with metastases Elevated preoperative CEA Cycle 1 FOLFOX 10/28/2020 Cycle 2 FOLFOX 11/11/2020 Cycle 3 FOLFOX 11/25/2020 Cycle 4 FOLFOX 12/09/2020 Cycle 5 FOLFOX 12/23/2020 CT chest 12/25/2020-unchanged lung nodules, 2 cm subpleural nodule in  the left posterior chest more prominent-loculated fluid? Cycle 6 FOLFOX 02/03/2021-5-FU dose reduced and bolus eliminated secondary to diarrhea Cycle 7 FOLFOX 02/17/2021 Cycle 8 FOLFOX 03/03/2021 PET 03/17/2021 pulmonary nodules without significant change, 10 mm nodule in right middle lobe with mild FDG uptake, mildly hypermetabolic pleural nodule in the lower left hemothorax, new clustered nodularity in the apical and posterior right upper lobe-likely infectious CTs 07/23/2021-new left pleural effusion with multiple pleural nodules, increased size of several pulmonary nodules, stable left paracolic gutter nodule, morphologic features suggestive of early cirrhosis Thoracentesis 07/24/2021-1200 cc of fluid removed, rare atypical cells present Thoracentesis 627 2023-1.5 L of fluid removed, atypical cells present, acute inflammation and blood Thoracentesis 08/14/2021- 1.2 L of fluid removed, adenocarcinoma compatible with metastatic colon cancer Thoracentesis 08/21/2021-adenocarcinoma Cycle 1 FOLFIRI/bevacizumab 09/01/2021 Cycle 2 FOLFIRI/bevacizumab 09/15/2021 Cycle 3 FOLFIRI/bevacizumab 09/29/2021-Decadron prophylaxis added for delayed nausea Cycle 4 FOLFIRI/bevacizumab 10/13/2021 Cycle 5 FOLFIRI/bevacizumab 10/27/2021 CT chest 10/28/2021-improvement in left pleural effusion, rule/parenchymal metastases-some are slightly smaller, no new lesions Cycle 6 FOLFIRI/bevacizumab 11/10/2021 Cycle 7 FOLFIRI/bevacizumab 12/01/2021 Cycle 8 FOLFIRI/bevacizumab 12/22/2021 CT chest 01/07/2022-stable pulmonary parenchymal and pleural metastatic disease. Cycle 9 FOLFIRI/bevacizumab 02/09/2022 03/02/2022 treatment discontinued due to poor tolerance Xeloda 7 days on/7 days off beginning 03/08/2022, Avastin continued every 3 weeks CT chest 03/09/2022-no significant change in appearance of bilateral pulmonary metastasis, trace left pleural effusion, nodular liver contour Xeloda 7 days on/7 days off and Avastin every 3 weeks CT chest  06/01/2022-mild enlargement of several pulmonary metastases, trace left pleural effusion Xeloda 7 days on/7 days off and Avastin every 3 weeks 06/17/2022 Xeloda resume 7 days on/7 days off 08/03/2022, declines Avastin CTs 08/19/2022-pleural  lung nodules appear to be slightly diminished in size, pulmonary parenchymal nodules appear to be slightly enlarged, overall little change.  No evidence of lymphadenopathy or metastatic disease in the abdomen or pelvis. Xeloda continued 7 days on/7 days off 08/24/2022 Xeloda dose reduced to 2 tablets twice daily 7 days on/7 days off beginning 10/21/2022 due to mucositis CTs 11/11/2022-pulmonary nodules have enlarged slightly from 08/19/2022.  Pleural nodularity thickening and fluid in the left lower hemithorax is similar with slight mass effect on the left hemidiaphragm. Xeloda continued 7 days on/7 days off 11/18/2022 CTs 02/03/2023: No change in pulmonary nodules, nodular densities overlying the bladder dome have enlarged Avastin resumed 02/15/2023  2.  Port-A-Cath placement-Dr. 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  Vertigo April 2024 5.  MRI brain 07/20/2022-no acute or reversible finding.  No evidence of metastatic disease.  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  Disposition: [name].  He continues Xeloda 7 days on/7 days off and Avastin every 3 weeks.  He is tolerating treatment well.  There is no clinical evidence of disease progression.  Plan to proceed with Avastin today as scheduled.  CBC and chemistry panel reviewed.  Labs adequate to proceed as above.  CEA is stable.  Urine negative for protein.  He will return for follow-up and treatment in 3 weeks.  We are available to see him sooner if needed.    Lonna Cobb ANP/GNP-BC   03/29/2023  11:37 AM

## 2023-03-29 NOTE — Patient Instructions (Signed)
 [facility]   Discharge Instructions: Thank you for choosing [facility] to provide your oncology and hematology care.   If you have a lab appointment with the Cancer Center, please go directly to the Cancer Center and check in at the registration area.   Wear comfortable clothing and clothing appropriate for easy access to any Portacath or PICC line.   We strive to give you quality time with your provider. You may need to reschedule your appointment if you arrive late (15 or more minutes).  Arriving late affects you and other patients whose appointments are after yours.  Also, if you miss three or more appointments without notifying the office, you may be dismissed from the clinic at the provider's discretion.      For prescription refill requests, have your pharmacy contact our office and allow 72 hours for refills to be completed.    Today you received the following chemotherapy and/or immunotherapy agents Bevacizumab-bvzr (ZIRABEV).      To help prevent nausea and vomiting after your treatment, we encourage you to take your nausea medication as directed.  BELOW ARE SYMPTOMS THAT SHOULD BE REPORTED IMMEDIATELY: *FEVER GREATER THAN 100.4 F (38 C) OR HIGHER *CHILLS OR SWEATING *NAUSEA AND VOMITING THAT IS NOT CONTROLLED WITH YOUR NAUSEA MEDICATION *UNUSUAL SHORTNESS OF BREATH *UNUSUAL BRUISING OR BLEEDING *URINARY PROBLEMS (pain or burning when urinating, or frequent urination) *BOWEL PROBLEMS (unusual diarrhea, constipation, pain near the anus) TENDERNESS IN MOUTH AND THROAT WITH OR WITHOUT PRESENCE OF ULCERS (sore throat, sores in mouth, or a toothache) UNUSUAL RASH, SWELLING OR PAIN  UNUSUAL VAGINAL DISCHARGE OR ITCHING   Items with * indicate a potential emergency and should be followed up as soon as possible or go to the Emergency Department if any problems should occur.  Please show the CHEMOTHERAPY ALERT  CARD or IMMUNOTHERAPY ALERT CARD at check-in to the Emergency Department and triage nurse.  Should you have questions after your visit or need to cancel or reschedule your appointment, please contact [facility]  Dept: [phone]  and follow the prompts.  Office hours are 8:00 a.m. to 4:30 p.m. Monday - Friday. Please note that voicemails left after 4:00 p.m. may not be returned until the following business day.  We are closed weekends and major holidays. You have access to a nurse at all times for urgent questions. Please call the main number to the clinic Dept: [phone] and follow the prompts.   For any non-urgent questions, you may also contact your provider using MyChart. We now offer e-Visits for anyone 70 and older to request care online for non-urgent symptoms. For details visit [email or web address].   Also download the MyChart app! Go to the app store, search "MyChart", open the app, select [place], and log in with your MyChart username and password.  Bevacizumab Injection What is this medication? BEVACIZUMAB (be va SIZ yoo mab) treats some types of cancer. It works by blocking a protein that causes cancer cells to grow and multiply. This helps to slow or stop the spread of cancer cells. It is a monoclonal antibody. This medicine may be used for other purposes; ask your health care provider or pharmacist if you have questions. COMMON BRAND NAME(S): Alymsys, Avastin, MVASI, Rosaland Lao What should I tell my care team before I take this medication? They need to know if you have any  of these conditions: Blood clots Coughing up blood Having or recent surgery Heart failure High blood pressure History of a connection between 2 or more body parts that do not usually connect (fistula) History of a tear in your stomach or intestines Protein in your urine An unusual or allergic reaction to bevacizumab, other medications,  foods, dyes, or preservatives Pregnant or trying to get pregnant Breast-feeding How should I use this medication? This medication is injected into a vein. It is given by your care team in a hospital or clinic setting. Talk to your care team the use of this medication in children. Special care may be needed. Overdosage: If you think you have taken too much of this medicine contact a poison control center or emergency room at once. NOTE: This medicine is only for you. Do not share this medicine with others. What if I miss a dose? Keep appointments for follow-up doses. It is important not to miss your dose. Call your care team if you are unable to keep an appointment. What may interact with this medication? Interactions are not expected. This list may not describe all possible interactions. Give your health care provider a list of all the medicines, herbs, non-prescription drugs, or dietary supplements you use. Also tell them if you smoke, drink alcohol, or use illegal drugs. Some items may interact with your medicine. What should I watch for while using this medication? Your condition will be monitored carefully while you are receiving this medication. You may need blood work while taking this medication. This medication may make you feel generally unwell. This is not uncommon as chemotherapy can affect healthy cells as well as cancer cells. Report any side effects. Continue your course of treatment even though you feel ill unless your care team tells you to stop. This medication may increase your risk to bruise or bleed. Call your care team if you notice any unusual bleeding. Before having surgery, talk to your care team to make sure it is ok. This medication can increase the risk of poor healing of your surgical site or wound. You will need to stop this medication for 28 days before surgery. After surgery, wait at least 28 days before restarting this medication. Make sure the surgical site or wound  is healed enough before restarting this medication. Talk to your care team if questions. Talk to your care team if you may be pregnant. Serious birth defects can occur if you take this medication during pregnancy and for 6 months after the last dose. Contraception is recommended while taking this medication and for 6 months after the last dose. Your care team can help you find the option that works for you. Do not breastfeed while taking this medication and for 6 months after the last dose. This medication can cause infertility. Talk to your care team if you are concerned about your fertility. What side effects may I notice from receiving this medication? Side effects that you should report to your care team as soon as possible: Allergic reactions--skin rash, itching, hives, swelling of the face, lips, tongue, or throat Bleeding--bloody or black, tar-like stools, vomiting blood or brown material that looks like coffee grounds, red or dark brown urine, small red or purple spots on skin, unusual bruising or bleeding Blood clot--pain, swelling, or warmth in the leg, shortness of breath, chest pain Heart attack--pain or tightness in the chest, shoulders, arms, or jaw, nausea, shortness of breath, cold or clammy skin, feeling faint or lightheaded Heart failure--shortness of breath,  swelling of the ankles, feet, or hands, sudden weight gain, unusual weakness or fatigue Increase in blood pressure Infection--fever, chills, cough, sore throat, wounds that don't heal, pain or trouble when passing urine, general feeling of discomfort or being unwell Infusion reactions--chest pain, shortness of breath or trouble breathing, feeling faint or lightheaded Kidney injury--decrease in the amount of urine, swelling of the ankles, hands, or feet Stomach pain that is severe, does not go away, or gets worse Stroke--sudden numbness or weakness of the face, arm, or leg, trouble speaking, confusion, trouble walking, loss of  balance or coordination, dizziness, severe headache, change in vision Sudden and severe headache, confusion, change in vision, seizures, which may be signs of posterior reversible encephalopathy syndrome (PRES) Side effects that usually do not require medical attention (report to your care team if they continue or are bothersome): Back pain Change in taste Diarrhea Dry skin Increased tears Nosebleed This list may not describe all possible side effects. Call your doctor for medical advice about side effects. You may report side effects to FDA at 1-800-FDA-1088. Where should I keep my medication? This medication is given in a hospital or clinic. It will not be stored at home. NOTE: This sheet is a summary. It may not cover all possible information. If you have questions about this medicine, talk to your doctor, pharmacist, or health care provider.  2024 Elsevier/Gold Standard (2021-06-12 00:00:00)

## 2023-03-29 NOTE — Progress Notes (Signed)
 Patient seen by Lonna Cobb NP today  Vitals are within treatment parameters:Yes   Labs are within treatment parameters: Yes   Treatment plan has been signed: Yes   Per physician team, Patient is ready for treatment and there are NO modifications to the treatment plan.

## 2023-03-30 ENCOUNTER — Other Ambulatory Visit: Payer: Self-pay

## 2023-04-01 ENCOUNTER — Other Ambulatory Visit: Payer: Self-pay

## 2023-04-06 ENCOUNTER — Other Ambulatory Visit: Payer: Self-pay

## 2023-04-06 ENCOUNTER — Other Ambulatory Visit: Payer: Self-pay | Admitting: Oncology

## 2023-04-06 DIAGNOSIS — C187 Malignant neoplasm of sigmoid colon: Secondary | ICD-10-CM

## 2023-04-06 NOTE — Progress Notes (Signed)
 Specialty Pharmacy Refill Coordination Note  Tracker Craig Best is a 83 y.o. male contacted today regarding refills of specialty medication(s) Capecitabine (XELODA)   Patient requested Delivery   Delivery date: 04/12/23   Verified address: [address] [place] [address]   Medication will be filled on 04/11/23, pending refill approval.     This fill date is pending response to refill request from provider. Patient is aware and if they have not received fill by intended date they must follow up with pharmacy.

## 2023-04-07 ENCOUNTER — Other Ambulatory Visit (HOSPITAL_COMMUNITY): Payer: Self-pay

## 2023-04-07 ENCOUNTER — Other Ambulatory Visit: Payer: Self-pay

## 2023-04-07 MED ORDER — CAPECITABINE 500 MG PO TABS
1000.0000 mg | ORAL_TABLET | Freq: Two times a day (BID) | ORAL | 1 refills | Status: DC
  Filled 2023-04-07: qty 56, 28d supply, fill #0
  Filled 2023-04-28: qty 56, 28d supply, fill #1

## 2023-04-11 ENCOUNTER — Other Ambulatory Visit: Payer: Self-pay

## 2023-04-12 ENCOUNTER — Other Ambulatory Visit: Payer: Self-pay | Admitting: Oncology

## 2023-04-19 ENCOUNTER — Inpatient Hospital Stay: Payer: Medicare Other

## 2023-04-19 ENCOUNTER — Inpatient Hospital Stay: Payer: Medicare Other | Attending: Oncology

## 2023-04-19 ENCOUNTER — Inpatient Hospital Stay: Payer: Medicare Other | Admitting: Oncology

## 2023-04-19 VITALS — BP 159/79 | HR 66 | Temp 98.1°F | Resp 18 | Ht 68.0 in | Wt 210.3 lb

## 2023-04-19 VITALS — BP 156/78 | HR 60 | Temp 98.4°F | Resp 18

## 2023-04-19 DIAGNOSIS — C187 Malignant neoplasm of sigmoid colon: Secondary | ICD-10-CM | POA: Insufficient documentation

## 2023-04-19 DIAGNOSIS — C7801 Secondary malignant neoplasm of right lung: Secondary | ICD-10-CM | POA: Diagnosis not present

## 2023-04-19 DIAGNOSIS — Z5112 Encounter for antineoplastic immunotherapy: Secondary | ICD-10-CM | POA: Insufficient documentation

## 2023-04-19 DIAGNOSIS — C7802 Secondary malignant neoplasm of left lung: Secondary | ICD-10-CM | POA: Insufficient documentation

## 2023-04-19 LAB — CBC WITH DIFFERENTIAL (CANCER CENTER ONLY)
Abs Immature Granulocytes: 0.03 10*3/uL (ref 0.00–0.07)
Basophils Absolute: 0.2 10*3/uL — ABNORMAL HIGH (ref 0.0–0.1)
Basophils Relative: 2 %
Eosinophils Absolute: 0.5 10*3/uL (ref 0.0–0.5)
Eosinophils Relative: 6 %
HCT: 46.7 % (ref 39.0–52.0)
Hemoglobin: 15.9 g/dL (ref 13.0–17.0)
Immature Granulocytes: 0 %
Lymphocytes Relative: 21 %
Lymphs Abs: 1.7 10*3/uL (ref 0.7–4.0)
MCH: 31.4 pg (ref 26.0–34.0)
MCHC: 34 g/dL (ref 30.0–36.0)
MCV: 92.1 fL (ref 80.0–100.0)
Monocytes Absolute: 0.6 10*3/uL (ref 0.1–1.0)
Monocytes Relative: 7 %
Neutro Abs: 5.3 10*3/uL (ref 1.7–7.7)
Neutrophils Relative %: 64 %
Platelet Count: 277 10*3/uL (ref 150–400)
RBC: 5.07 MIL/uL (ref 4.22–5.81)
RDW: 15.5 % (ref 11.5–15.5)
WBC Count: 8.3 10*3/uL (ref 4.0–10.5)
nRBC: 0 % (ref 0.0–0.2)

## 2023-04-19 LAB — CMP (CANCER CENTER ONLY)
ALT: 11 U/L (ref 0–44)
AST: 15 U/L (ref 15–41)
Albumin: 4.2 g/dL (ref 3.5–5.0)
Alkaline Phosphatase: 74 U/L (ref 38–126)
Anion gap: 7 (ref 5–15)
BUN: 18 mg/dL (ref 8–23)
CO2: 29 mmol/L (ref 22–32)
Calcium: 8.9 mg/dL (ref 8.9–10.3)
Chloride: 101 mmol/L (ref 98–111)
Creatinine: 0.82 mg/dL (ref 0.61–1.24)
GFR, Estimated: >60 mL/min (ref >60)
Glucose, Bld: 102 mg/dL — ABNORMAL HIGH (ref 70–99)
Potassium: 4 mmol/L (ref 3.5–5.1)
Sodium: 137 mmol/L (ref 135–145)
Total Bilirubin: 0.5 mg/dL (ref 0.0–1.2)
Total Protein: 7.6 g/dL (ref 6.5–8.1)

## 2023-04-19 LAB — TOTAL PROTEIN, URINE DIPSTICK: Protein, ur: NEGATIVE mg/dL

## 2023-04-19 LAB — CEA (ACCESS): CEA (CHCC): 43.93 ng/mL — ABNORMAL HIGH (ref 0.00–5.00)

## 2023-04-19 MED ORDER — HEPARIN SOD (PORK) LOCK FLUSH 100 UNIT/ML IV SOLN
500.0000 [IU] | Freq: Once | INTRAVENOUS | Status: AC | PRN
  Administered 2023-04-19: 500 [IU]

## 2023-04-19 MED ORDER — SODIUM CHLORIDE 0.9% FLUSH
10.0000 mL | INTRAVENOUS | Status: DC | PRN
  Administered 2023-04-19: 10 mL

## 2023-04-19 MED ORDER — SODIUM CHLORIDE 0.9 % IV SOLN
Freq: Once | INTRAVENOUS | Status: AC
Start: 2023-04-19 — End: 2023-04-19

## 2023-04-19 MED ORDER — SODIUM CHLORIDE 0.9 % IV SOLN
7.5000 mg/kg | Freq: Once | INTRAVENOUS | Status: AC
  Administered 2023-04-19: 700 mg via INTRAVENOUS
  Filled 2023-04-19: qty 16

## 2023-04-19 NOTE — Patient Instructions (Signed)
 [facility]  Discharge Instructions: Thank you for choosing [facility] to provide your oncology and hematology care.   If you have a lab appointment with the Cancer Center, please go directly to the Cancer Center and check in at the registration area.   Wear comfortable clothing and clothing appropriate for easy access to any Portacath or PICC line.   We strive to give you quality time with your provider. You may need to reschedule your appointment if you arrive late (15 or more minutes).  Arriving late affects you and other patients whose appointments are after yours.  Also, if you miss three or more appointments without notifying the office, you may be dismissed from the clinic at the provider's discretion.      For prescription refill requests, have your pharmacy contact our office and allow 72 hours for refills to be completed.    Today you received the following chemotherapy and/or immunotherapy agents Avastin      To help prevent nausea and vomiting after your treatment, we encourage you to take your nausea medication as directed.  BELOW ARE SYMPTOMS THAT SHOULD BE REPORTED IMMEDIATELY: *FEVER GREATER THAN 100.4 F (38 C) OR HIGHER *CHILLS OR SWEATING *NAUSEA AND VOMITING THAT IS NOT CONTROLLED WITH YOUR NAUSEA MEDICATION *UNUSUAL SHORTNESS OF BREATH *UNUSUAL BRUISING OR BLEEDING *URINARY PROBLEMS (pain or burning when urinating, or frequent urination) *BOWEL PROBLEMS (unusual diarrhea, constipation, pain near the anus) TENDERNESS IN MOUTH AND THROAT WITH OR WITHOUT PRESENCE OF ULCERS (sore throat, sores in mouth, or a toothache) UNUSUAL RASH, SWELLING OR PAIN  UNUSUAL VAGINAL DISCHARGE OR ITCHING   Items with * indicate a potential emergency and should be followed up as soon as possible or go to the Emergency Department if any problems should occur.  Please show the CHEMOTHERAPY ALERT CARD or IMMUNOTHERAPY  ALERT CARD at check-in to the Emergency Department and triage nurse.  Should you have questions after your visit or need to cancel or reschedule your appointment, please contact [facility]  Dept: [phone]  and follow the prompts.  Office hours are 8:00 a.m. to 4:30 p.m. Monday - Friday. Please note that voicemails left after 4:00 p.m. may not be returned until the following business day.  We are closed weekends and major holidays. You have access to a nurse at all times for urgent questions. Please call the main number to the clinic Dept: [phone] and follow the prompts.   For any non-urgent questions, you may also contact your provider using MyChart. We now offer e-Visits for anyone 44 and older to request care online for non-urgent symptoms. For details visit [email or web address].   Also download the MyChart app! Go to the app store, search "MyChart", open the app, select [place], and log in with your MyChart username and password.

## 2023-04-19 NOTE — Progress Notes (Signed)
 [facility] OFFICE PROGRESS NOTE   Diagnosis: Colon cancer  INTERVAL HISTORY:   [name] returns as scheduled.  He was last treated with bevacizumab 03/29/2023.  He continues Xeloda. He reports developing a "cold "approximately 2 weeks ago.  He has a cough and nasal congestion.  His symptoms have improved.  No fever.  He has discomfort at the left shoulder with movement.  No mouth sores.  He reports 1 episode of diarrhea.  No hand or foot pain.  He is working.  No dyspnea except when he breathes cold air.  He missed 4 days of Xeloda last week due to the respiratory infection.  Objective:  Vital signs in last 24 hours:  Blood pressure (!) 159/79, pulse 66, temperature 98.1 F (36.7 C), temperature source Temporal, resp. rate 18, height 5\' 8"  (1.727 m), weight 210 lb 4.8 oz (95.4 kg), SpO2 98%.    HEENT: No thrush or ulcers Resp: Decreased breath sounds at the left compared to the right chest, no respiratory distress Cardio: Regular rate and rhythm GI: No hepatosplenomegaly, left lower quadrant colostomy Vascular: Trace lower leg edema on the right greater than left  Skin: Palms without erythema  Portacath/PICC-without erythema  Lab Results:  Lab Results  Component Value Date   WBC 8.3 04/19/2023   HGB 15.9 04/19/2023   HCT 46.7 04/19/2023   MCV 92.1 04/19/2023   PLT 277 04/19/2023   NEUTROABS 5.3 04/19/2023    CMP  Lab Results  Component Value Date   NA 137 03/29/2023   K 3.7 03/29/2023   CL 102 03/29/2023   CO2 29 03/29/2023   GLUCOSE 109 (H) 03/29/2023   BUN 18 03/29/2023   CREATININE 0.84 03/29/2023   CALCIUM 9.5 03/29/2023   PROT 7.8 03/29/2023   ALBUMIN 3.9 03/29/2023   AST 15 03/29/2023   ALT 10 03/29/2023   ALKPHOS 78 03/29/2023   BILITOT 0.5 03/29/2023   GFRNONAA >60 03/29/2023    Lab Results  Component Value Date   CEA1 63.2 (H) 09/19/2020   CEA 47.00 (H) 03/29/2023     Medications: I have reviewed the patient's current  medications.   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), status post a sigmoid colectomy and partial bladder resection 09/19/2020 2 separate sigmoid colon tumors, 2.5 cm apart, 1/13 lymph nodes positive, tumor extends to adherent soft tissue of the bladder, MSS, normal mismatch repair protein expression tumor mutation burden 0, K-ras G12D CT abdomen/pelvis 09/19/2020-sigmoid colon mass with colonic obstruction, 6 mm right lower lobe nodule CT chest 09/22/2020-multiple bilateral lung nodules consistent with metastases Elevated preoperative CEA Cycle 1 FOLFOX 10/28/2020 Cycle 2 FOLFOX 11/11/2020 Cycle 3 FOLFOX 11/25/2020 Cycle 4 FOLFOX 12/09/2020 Cycle 5 FOLFOX 12/23/2020 CT chest 12/25/2020-unchanged lung nodules, 2 cm subpleural nodule in the left posterior chest more prominent-loculated fluid? Cycle 6 FOLFOX 02/03/2021-5-FU dose reduced and bolus eliminated secondary to diarrhea Cycle 7 FOLFOX 02/17/2021 Cycle 8 FOLFOX 03/03/2021 PET 03/17/2021 pulmonary nodules without significant change, 10 mm nodule in right middle lobe with mild FDG uptake, mildly hypermetabolic pleural nodule in the lower left hemothorax, new clustered nodularity in the apical and posterior right upper lobe-likely infectious CTs 07/23/2021-new left pleural effusion with multiple pleural nodules, increased size of several pulmonary nodules, stable left paracolic gutter nodule, morphologic features suggestive of early cirrhosis Thoracentesis 07/24/2021-1200 cc of fluid removed, rare atypical cells present Thoracentesis 627 2023-1.5 L of fluid removed, atypical cells present, acute inflammation and blood Thoracentesis 08/14/2021- 1.2 L of fluid removed,  adenocarcinoma compatible with metastatic colon cancer Thoracentesis 08/21/2021-adenocarcinoma Cycle 1 FOLFIRI/bevacizumab 09/01/2021 Cycle 2 FOLFIRI/bevacizumab 09/15/2021 Cycle 3 FOLFIRI/bevacizumab 09/29/2021-Decadron prophylaxis added for delayed nausea Cycle 4  FOLFIRI/bevacizumab 10/13/2021 Cycle 5 FOLFIRI/bevacizumab 10/27/2021 CT chest 10/28/2021-improvement in left pleural effusion, rule/parenchymal metastases-some are slightly smaller, no new lesions Cycle 6 FOLFIRI/bevacizumab 11/10/2021 Cycle 7 FOLFIRI/bevacizumab 12/01/2021 Cycle 8 FOLFIRI/bevacizumab 12/22/2021 CT chest 01/07/2022-stable pulmonary parenchymal and pleural metastatic disease. Cycle 9 FOLFIRI/bevacizumab 02/09/2022 03/02/2022 treatment discontinued due to poor tolerance Xeloda 7 days on/7 days off beginning 03/08/2022, Avastin continued every 3 weeks CT chest 03/09/2022-no significant change in appearance of bilateral pulmonary metastasis, trace left pleural effusion, nodular liver contour Xeloda 7 days on/7 days off and Avastin every 3 weeks CT chest 06/01/2022-mild enlargement of several pulmonary metastases, trace left pleural effusion Xeloda 7 days on/7 days off and Avastin every 3 weeks 06/17/2022 Xeloda resume 7 days on/7 days off 08/03/2022, declines Avastin CTs 08/19/2022-pleural lung nodules appear to be slightly diminished in size, pulmonary parenchymal nodules appear to be slightly enlarged, overall little change.  No evidence of lymphadenopathy or metastatic disease in the abdomen or pelvis. Xeloda continued 7 days on/7 days off 08/24/2022 Xeloda dose reduced to 2 tablets twice daily 7 days on/7 days off beginning 10/21/2022 due to mucositis CTs 11/11/2022-pulmonary nodules have enlarged slightly from 08/19/2022.  Pleural nodularity thickening and fluid in the left lower hemithorax is similar with slight mass effect on the left hemidiaphragm. Xeloda continued 7 days on/7 days off 11/18/2022 CTs 02/03/2023: No change in pulmonary nodules, nodular densities overlying the bladder dome have enlarged Avastin resumed 02/15/2023  2.  Port-A-Cath placement-Dr. 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  Vertigo April 2024 5.  MRI brain 07/20/2022-no acute or reversible finding.  No evidence of metastatic disease.  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    Disposition: [name] has metastatic colon cancer.  He continues treatment with Xeloda/bevacizumab.  His overall clinical status appears stable.  He appears to be recovering from upper respiratory infection.  He will resume Xeloda on schedule next week.  To complete another treat with bevacizumab today. [name] will be scheduled for restaging CTs prior to an office visit in 3 weeks. We will follow-up on the CEA from today.  [name], MD  04/19/2023  8:24 AM

## 2023-04-19 NOTE — Progress Notes (Signed)
 Patient seen by Dr. [name] today  Vitals are within treatment parameters:Yes   Labs are within treatment parameters: Yes   Treatment plan has been signed: Yes   Per physician team, Patient is ready for treatment and there are NO modifications to the treatment plan.

## 2023-04-21 ENCOUNTER — Encounter: Payer: Self-pay | Admitting: *Deleted

## 2023-04-22 ENCOUNTER — Other Ambulatory Visit: Payer: Self-pay

## 2023-04-27 ENCOUNTER — Other Ambulatory Visit: Payer: Self-pay

## 2023-04-28 ENCOUNTER — Other Ambulatory Visit: Payer: Self-pay

## 2023-04-28 NOTE — Progress Notes (Signed)
 Specialty Pharmacy Refill Coordination Note  Craig Best is a 83 y.o. male contacted today regarding refills of specialty medication(s) Capecitabine (XELODA)   Patient requested (Patient-Rptd) Delivery   Delivery date: (Patient-Rptd) 05/10/23   Verified address: (Patient-Rptd) [address] [place] [address]   Medication will be filled on 05/09/23.

## 2023-05-05 ENCOUNTER — Other Ambulatory Visit

## 2023-05-06 ENCOUNTER — Ambulatory Visit (HOSPITAL_BASED_OUTPATIENT_CLINIC_OR_DEPARTMENT_OTHER)
Admission: RE | Admit: 2023-05-06 | Discharge: 2023-05-06 | Disposition: A | Discharge status: Home / Self Care | Source: Ambulatory Visit | Attending: Oncology | Admitting: Oncology

## 2023-05-06 ENCOUNTER — Inpatient Hospital Stay

## 2023-05-06 DIAGNOSIS — Z95828 Presence of other vascular implants and grafts: Secondary | ICD-10-CM

## 2023-05-06 DIAGNOSIS — C189 Malignant neoplasm of colon, unspecified: Secondary | ICD-10-CM | POA: Diagnosis not present

## 2023-05-06 DIAGNOSIS — C187 Malignant neoplasm of sigmoid colon: Secondary | ICD-10-CM

## 2023-05-06 DIAGNOSIS — C7802 Secondary malignant neoplasm of left lung: Secondary | ICD-10-CM | POA: Diagnosis not present

## 2023-05-06 DIAGNOSIS — E041 Nontoxic single thyroid nodule: Secondary | ICD-10-CM | POA: Diagnosis not present

## 2023-05-06 DIAGNOSIS — C782 Secondary malignant neoplasm of pleura: Secondary | ICD-10-CM | POA: Diagnosis not present

## 2023-05-06 DIAGNOSIS — K8020 Calculus of gallbladder without cholecystitis without obstruction: Secondary | ICD-10-CM | POA: Diagnosis not present

## 2023-05-06 DIAGNOSIS — Z5112 Encounter for antineoplastic immunotherapy: Secondary | ICD-10-CM | POA: Diagnosis not present

## 2023-05-06 DIAGNOSIS — C7801 Secondary malignant neoplasm of right lung: Secondary | ICD-10-CM | POA: Diagnosis not present

## 2023-05-06 LAB — CBC WITH DIFFERENTIAL (CANCER CENTER ONLY)
Abs Immature Granulocytes: 0.01 10*3/uL (ref 0.00–0.07)
Basophils Absolute: 0.1 10*3/uL (ref 0.0–0.1)
Basophils Relative: 2 %
Eosinophils Absolute: 0.4 10*3/uL (ref 0.0–0.5)
Eosinophils Relative: 5 %
HCT: 47.5 % (ref 39.0–52.0)
Hemoglobin: 16.2 g/dL (ref 13.0–17.0)
Immature Granulocytes: 0 %
Lymphocytes Relative: 25 %
Lymphs Abs: 2 10*3/uL (ref 0.7–4.0)
MCH: 32 pg (ref 26.0–34.0)
MCHC: 34.1 g/dL (ref 30.0–36.0)
MCV: 93.9 fL (ref 80.0–100.0)
Monocytes Absolute: 0.6 10*3/uL (ref 0.1–1.0)
Monocytes Relative: 7 %
Neutro Abs: 4.9 10*3/uL (ref 1.7–7.7)
Neutrophils Relative %: 61 %
Platelet Count: 265 10*3/uL (ref 150–400)
RBC: 5.06 MIL/uL (ref 4.22–5.81)
RDW: 15.4 % (ref 11.5–15.5)
WBC Count: 8 10*3/uL (ref 4.0–10.5)
nRBC: 0 % (ref 0.0–0.2)

## 2023-05-06 LAB — CEA (ACCESS): CEA (CHCC): 49.05 ng/mL — ABNORMAL HIGH (ref 0.00–5.00)

## 2023-05-06 LAB — CMP (CANCER CENTER ONLY)
ALT: 11 U/L (ref 10–47)
AST: 15 U/L (ref 11–38)
Albumin: 4.2 g/dL (ref 3.5–5.0)
Alkaline Phosphatase: 58 U/L (ref 38–126)
Anion gap: 7 (ref 5–15)
BUN: 18 mg/dL (ref 8–23)
CO2: 29 mmol/L (ref 22–32)
Calcium: 9 mg/dL (ref 8.9–10.3)
Chloride: 103 mmol/L (ref 98–111)
Creatinine: 0.76 mg/dL (ref 0.60–1.20)
Glucose, Bld: 108 mg/dL — ABNORMAL HIGH (ref 70–99)
Potassium: 3.6 mmol/L (ref 3.5–5.1)
Sodium: 139 mmol/L (ref 135–145)
Total Bilirubin: 0.5 mg/dL (ref 0.2–1.6)
Total Protein: 7.3 g/dL (ref 6.5–8.1)

## 2023-05-06 MED ORDER — HEPARIN SOD (PORK) LOCK FLUSH 100 UNIT/ML IV SOLN
500.0000 [IU] | Freq: Once | INTRAVENOUS | Status: AC
  Administered 2023-05-06: 500 [IU] via INTRAVENOUS

## 2023-05-06 MED ORDER — SODIUM CHLORIDE 0.9% FLUSH
10.0000 mL | Freq: Once | INTRAVENOUS | Status: AC
  Administered 2023-05-06: 10 mL via INTRAVENOUS

## 2023-05-06 MED ORDER — IOHEXOL 300 MG/ML  SOLN
100.0000 mL | Freq: Once | INTRAMUSCULAR | Status: AC | PRN
Start: 2023-05-06 — End: 2023-05-06
  Administered 2023-05-06: 100 mL via INTRAVENOUS

## 2023-05-06 MED ORDER — SODIUM CHLORIDE 0.9% FLUSH
10.0000 mL | INTRAVENOUS | Status: DC | PRN
Start: 2023-05-06 — End: 2023-05-06
  Administered 2023-05-06: 10 mL via INTRAVENOUS

## 2023-05-06 NOTE — Addendum Note (Signed)
 Addended by: Julieanne Manson on: 05/06/2023 12:18 PM   Modules accepted: Orders

## 2023-05-09 ENCOUNTER — Other Ambulatory Visit: Payer: Self-pay

## 2023-05-10 ENCOUNTER — Inpatient Hospital Stay: Payer: Medicare Other | Admitting: Nurse Practitioner

## 2023-05-10 ENCOUNTER — Inpatient Hospital Stay: Payer: Medicare Other

## 2023-05-10 ENCOUNTER — Encounter: Payer: Self-pay | Admitting: Nurse Practitioner

## 2023-05-10 ENCOUNTER — Inpatient Hospital Stay: Payer: Medicare Other | Attending: Oncology

## 2023-05-10 VITALS — BP 174/79 | HR 58

## 2023-05-10 VITALS — BP 160/82 | HR 64 | Temp 98.1°F | Resp 18 | Ht 68.0 in | Wt 209.1 lb

## 2023-05-10 DIAGNOSIS — C187 Malignant neoplasm of sigmoid colon: Secondary | ICD-10-CM

## 2023-05-10 DIAGNOSIS — C7802 Secondary malignant neoplasm of left lung: Secondary | ICD-10-CM | POA: Diagnosis not present

## 2023-05-10 DIAGNOSIS — C7801 Secondary malignant neoplasm of right lung: Secondary | ICD-10-CM | POA: Diagnosis not present

## 2023-05-10 DIAGNOSIS — Z5112 Encounter for antineoplastic immunotherapy: Secondary | ICD-10-CM | POA: Diagnosis not present

## 2023-05-10 LAB — TOTAL PROTEIN, URINE DIPSTICK: Protein, ur: NEGATIVE mg/dL

## 2023-05-10 MED ORDER — SODIUM CHLORIDE 0.9% FLUSH
10.0000 mL | INTRAVENOUS | Status: DC | PRN
  Administered 2023-05-10: 10 mL

## 2023-05-10 MED ORDER — HEPARIN SOD (PORK) LOCK FLUSH 100 UNIT/ML IV SOLN
500.0000 [IU] | Freq: Once | INTRAVENOUS | Status: AC | PRN
Start: 2023-05-10 — End: 2023-05-10
  Administered 2023-05-10: 500 [IU]

## 2023-05-10 MED ORDER — SODIUM CHLORIDE 0.9 % IV SOLN: Freq: Once | INTRAVENOUS | Status: AC

## 2023-05-10 MED ORDER — SODIUM CHLORIDE 0.9 % IV SOLN
7.5000 mg/kg | Freq: Once | INTRAVENOUS | Status: AC
  Administered 2023-05-10: 700 mg via INTRAVENOUS
  Filled 2023-05-10: qty 16

## 2023-05-10 NOTE — Progress Notes (Signed)
 [facility] OFFICE PROGRESS NOTE   Diagnosis: Colon cancer  INTERVAL HISTORY:   [name] returns as scheduled.  He continues Xeloda.  He completed another cycle of bevacizumab 04/19/2023.  He denies nausea/vomiting.  No mouth sores.  No diarrhea.  No hand or foot pain or redness.  No bleeding.  Last week he had congestion and felt achy.  No fever.  Symptoms are better.  He thinks his blood pressure is elevated due to taking cold medication.  He is scheduled to begin another cycle of Xeloda tomorrow.  Objective:  Vital signs in last 24 hours:  Blood pressure (!) 160/82, pulse 64, temperature 98.1 F (36.7 C), temperature source Temporal, resp. rate 18, height 5\' 8"  (1.727 m), weight 209 lb 1.6 oz (94.8 kg), SpO2 98%.    HEENT: No thrush or ulcers. Resp: Lungs clear bilaterally. Cardio: Regular rate and rhythm. GI: No hepatosplenomegaly.  Left lower quadrant colostomy. Vascular: No leg edema. Skin: Palms without erythema. Port-A-Cath without erythema.  Lab Results:  Lab Results  Component Value Date   WBC 8.0 05/06/2023   HGB 16.2 05/06/2023   HCT 47.5 05/06/2023   MCV 93.9 05/06/2023   PLT 265 05/06/2023   NEUTROABS 4.9 05/06/2023    Imaging:  No results found.  Medications: I have reviewed the patient's current medications.  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), status post a sigmoid colectomy and partial bladder resection 09/19/2020 2 separate sigmoid colon tumors, 2.5 cm apart, 1/13 lymph nodes positive, tumor extends to adherent soft tissue of the bladder, MSS, normal mismatch repair protein expression tumor mutation burden 0, K-ras G12D CT abdomen/pelvis 09/19/2020-sigmoid colon mass with colonic obstruction, 6 mm right lower lobe nodule CT chest 09/22/2020-multiple bilateral lung nodules consistent with metastases Elevated preoperative CEA Cycle 1 FOLFOX 10/28/2020 Cycle 2 FOLFOX 11/11/2020 Cycle 3 FOLFOX 11/25/2020 Cycle  4 FOLFOX 12/09/2020 Cycle 5 FOLFOX 12/23/2020 CT chest 12/25/2020-unchanged lung nodules, 2 cm subpleural nodule in the left posterior chest more prominent-loculated fluid? Cycle 6 FOLFOX 02/03/2021-5-FU dose reduced and bolus eliminated secondary to diarrhea Cycle 7 FOLFOX 02/17/2021 Cycle 8 FOLFOX 03/03/2021 PET 03/17/2021 pulmonary nodules without significant change, 10 mm nodule in right middle lobe with mild FDG uptake, mildly hypermetabolic pleural nodule in the lower left hemothorax, new clustered nodularity in the apical and posterior right upper lobe-likely infectious CTs 07/23/2021-new left pleural effusion with multiple pleural nodules, increased size of several pulmonary nodules, stable left paracolic gutter nodule, morphologic features suggestive of early cirrhosis Thoracentesis 07/24/2021-1200 cc of fluid removed, rare atypical cells present Thoracentesis 627 2023-1.5 L of fluid removed, atypical cells present, acute inflammation and blood Thoracentesis 08/14/2021- 1.2 L of fluid removed, adenocarcinoma compatible with metastatic colon cancer Thoracentesis 08/21/2021-adenocarcinoma Cycle 1 FOLFIRI/bevacizumab 09/01/2021 Cycle 2 FOLFIRI/bevacizumab 09/15/2021 Cycle 3 FOLFIRI/bevacizumab 09/29/2021-Decadron prophylaxis added for delayed nausea Cycle 4 FOLFIRI/bevacizumab 10/13/2021 Cycle 5 FOLFIRI/bevacizumab 10/27/2021 CT chest 10/28/2021-improvement in left pleural effusion, rule/parenchymal metastases-some are slightly smaller, no new lesions Cycle 6 FOLFIRI/bevacizumab 11/10/2021 Cycle 7 FOLFIRI/bevacizumab 12/01/2021 Cycle 8 FOLFIRI/bevacizumab 12/22/2021 CT chest 01/07/2022-stable pulmonary parenchymal and pleural metastatic disease. Cycle 9 FOLFIRI/bevacizumab 02/09/2022 03/02/2022 treatment discontinued due to poor tolerance Xeloda 7 days on/7 days off beginning 03/08/2022, Avastin continued every 3 weeks CT chest 03/09/2022-no significant change in appearance of bilateral pulmonary metastasis,  trace left pleural effusion, nodular liver contour Xeloda 7 days on/7 days off and Avastin every 3 weeks CT chest 06/01/2022-mild enlargement of several pulmonary metastases, trace left pleural effusion Xeloda 7 days on/7  days off and Avastin every 3 weeks 06/17/2022 Xeloda resume 7 days on/7 days off 08/03/2022, declines Avastin CTs 08/19/2022-pleural lung nodules appear to be slightly diminished in size, pulmonary parenchymal nodules appear to be slightly enlarged, overall little change.  No evidence of lymphadenopathy or metastatic disease in the abdomen or pelvis. Xeloda continued 7 days on/7 days off 08/24/2022 Xeloda dose reduced to 2 tablets twice daily 7 days on/7 days off beginning 10/21/2022 due to mucositis CTs 11/11/2022-pulmonary nodules have enlarged slightly from 08/19/2022.  Pleural nodularity thickening and fluid in the left lower hemithorax is similar with slight mass effect on the left hemidiaphragm. Xeloda continued 7 days on/7 days off 11/18/2022 CTs 02/03/2023: No change in pulmonary nodules, nodular densities overlying the bladder dome have enlarged Avastin resumed 02/15/2023 CTs 05/06/2023-  2.  Port-A-Cath placement-Dr. 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  Vertigo April 2024 5.  MRI brain 07/20/2022-no acute or reversible finding.  No evidence of metastatic disease.  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    Disposition: [name].  He continue Xeloda and Avastin.  Final report on the recent restaging CT scans is not available.  We reviewed the images with [name].  He appears to have stable disease.  Plan to continue Xeloda and Avastin.  He is in agreement.  We will contact him once the final CT report has been issued.  CBC and chemistry panel from 05/06/2023 reviewed.  Labs  adequate to proceed as above.  He will return for follow-up and Avastin in 3 weeks.  He will contact the office in the interim with any problems.  Patient seen with Dr. Truett Perna.  Lonna Cobb ANP/GNP-BC   05/10/2023  10:02 AM  This was a shared visit with Lonna Cobb.  We reviewed the restaging CT images with [name].  The CT findings are consistent with stable disease.  I recommend continuing capecitabine/bevacizumab.  He agrees.  [name] will complete another treatment with bevacizumab today.  He will return for an office visit and bevacizumab in 3 weeks.  I was present for greater than 50% of today's visit.  I performed medical decision making.  Mancel Bale, MD

## 2023-05-10 NOTE — Progress Notes (Signed)
 Patient seen by Lonna Cobb NP today  Vitals are within treatment parameters:Yes   Labs are within treatment parameters: Yes   Treatment plan has been signed: Yes   Per physician team, Patient is ready for treatment and there are NO modifications to the treatment plan.

## 2023-05-14 ENCOUNTER — Other Ambulatory Visit: Payer: Self-pay

## 2023-05-24 ENCOUNTER — Other Ambulatory Visit: Payer: Self-pay

## 2023-05-26 ENCOUNTER — Other Ambulatory Visit: Payer: Self-pay

## 2023-05-28 ENCOUNTER — Other Ambulatory Visit: Payer: Self-pay | Admitting: Oncology

## 2023-05-30 ENCOUNTER — Other Ambulatory Visit: Payer: Self-pay

## 2023-05-31 ENCOUNTER — Inpatient Hospital Stay: Admitting: Nurse Practitioner

## 2023-05-31 ENCOUNTER — Inpatient Hospital Stay

## 2023-05-31 ENCOUNTER — Encounter: Payer: Self-pay | Admitting: Nurse Practitioner

## 2023-05-31 VITALS — BP 154/80 | HR 65 | Temp 98.1°F | Resp 16 | Ht 68.0 in | Wt 205.7 lb

## 2023-05-31 DIAGNOSIS — Z95828 Presence of other vascular implants and grafts: Secondary | ICD-10-CM

## 2023-05-31 DIAGNOSIS — C7802 Secondary malignant neoplasm of left lung: Secondary | ICD-10-CM | POA: Diagnosis not present

## 2023-05-31 DIAGNOSIS — C187 Malignant neoplasm of sigmoid colon: Secondary | ICD-10-CM

## 2023-05-31 DIAGNOSIS — C7801 Secondary malignant neoplasm of right lung: Secondary | ICD-10-CM | POA: Diagnosis not present

## 2023-05-31 DIAGNOSIS — Z5112 Encounter for antineoplastic immunotherapy: Secondary | ICD-10-CM | POA: Diagnosis not present

## 2023-05-31 LAB — CBC WITH DIFFERENTIAL (CANCER CENTER ONLY)
Abs Immature Granulocytes: 0.01 10*3/uL (ref 0.00–0.07)
Basophils Absolute: 0.1 10*3/uL (ref 0.0–0.1)
Basophils Relative: 2 %
Eosinophils Absolute: 0.3 10*3/uL (ref 0.0–0.5)
Eosinophils Relative: 4 %
HCT: 45.7 % (ref 39.0–52.0)
Hemoglobin: 15.6 g/dL (ref 13.0–17.0)
Immature Granulocytes: 0 %
Lymphocytes Relative: 24 %
Lymphs Abs: 1.9 10*3/uL (ref 0.7–4.0)
MCH: 32 pg (ref 26.0–34.0)
MCHC: 34.1 g/dL (ref 30.0–36.0)
MCV: 93.6 fL (ref 80.0–100.0)
Monocytes Absolute: 0.6 10*3/uL (ref 0.1–1.0)
Monocytes Relative: 8 %
Neutro Abs: 5.1 10*3/uL (ref 1.7–7.7)
Neutrophils Relative %: 62 %
Platelet Count: 269 10*3/uL (ref 150–400)
RBC: 4.88 MIL/uL (ref 4.22–5.81)
RDW: 14.3 % (ref 11.5–15.5)
WBC Count: 8.1 10*3/uL (ref 4.0–10.5)
nRBC: 0 % (ref 0.0–0.2)

## 2023-05-31 LAB — CMP (CANCER CENTER ONLY)
ALT: 12 U/L (ref 0–44)
AST: 21 U/L (ref 15–41)
Albumin: 4.2 g/dL (ref 3.5–5.0)
Alkaline Phosphatase: 73 U/L (ref 38–126)
Anion gap: 9 (ref 5–15)
BUN: 13 mg/dL (ref 8–23)
CO2: 28 mmol/L (ref 22–32)
Calcium: 9.2 mg/dL (ref 8.9–10.3)
Chloride: 101 mmol/L (ref 98–111)
Creatinine: 0.93 mg/dL (ref 0.61–1.24)
GFR, Estimated: >60 mL/min (ref >60)
Glucose, Bld: 104 mg/dL — ABNORMAL HIGH (ref 70–99)
Potassium: 4 mmol/L (ref 3.5–5.1)
Sodium: 138 mmol/L (ref 135–145)
Total Bilirubin: 0.4 mg/dL (ref 0.0–1.2)
Total Protein: 7.3 g/dL (ref 6.5–8.1)

## 2023-05-31 LAB — TOTAL PROTEIN, URINE DIPSTICK: Protein, ur: NEGATIVE mg/dL

## 2023-05-31 LAB — CEA (ACCESS): CEA (CHCC): 51.38 ng/mL — ABNORMAL HIGH (ref 0.00–5.00)

## 2023-05-31 MED ORDER — AMLODIPINE BESYLATE 2.5 MG PO TABS: 2.5000 mg | ORAL_TABLET | Freq: Every day | ORAL | 1 refills | Status: DC

## 2023-05-31 MED ORDER — HEPARIN SOD (PORK) LOCK FLUSH 100 UNIT/ML IV SOLN
500.0000 [IU] | Freq: Once | INTRAVENOUS | Status: AC | PRN
Start: 2023-05-31 — End: 2023-05-31
  Administered 2023-05-31: 500 [IU]

## 2023-05-31 MED ORDER — SODIUM CHLORIDE 0.9 % IV SOLN: Freq: Once | INTRAVENOUS | Status: AC

## 2023-05-31 MED ORDER — SODIUM CHLORIDE 0.9% FLUSH
10.0000 mL | INTRAVENOUS | Status: DC | PRN
  Administered 2023-05-31: 10 mL

## 2023-05-31 MED ORDER — SODIUM CHLORIDE 0.9% FLUSH
10.0000 mL | Freq: Once | INTRAVENOUS | Status: AC
  Administered 2023-05-31: 10 mL via INTRAVENOUS

## 2023-05-31 MED ORDER — SODIUM CHLORIDE 0.9 % IV SOLN
7.5000 mg/kg | Freq: Once | INTRAVENOUS | Status: AC
  Administered 2023-05-31: 700 mg via INTRAVENOUS
  Filled 2023-05-31: qty 16

## 2023-05-31 NOTE — Progress Notes (Signed)
 Craig Best   Diagnosis: Colon cancer  INTERVAL HISTORY:   Craig Best returns as scheduled.  He continues Xeloda  7 days on/7 days off.  He completed a cycle of bevacizumab  05/10/2023.  He denies nausea/vomiting.  No mouth sores.  No diarrhea.  No hand or foot pain or redness.  No bleeding.  He notes his blood pressure at home is typically greater than 140/80.  Objective:  Vital signs in last 24 hours:  Blood pressure (!) 154/80, pulse 65, temperature 98.1 F (36.7 C), temperature source Temporal, resp. rate 16, height 5\' 8"  (1.727 m), weight 205 lb 11.2 oz (93.3 kg), SpO2 98%.    HEENT: No thrush or ulcers. Resp: Lungs clear bilaterally. Cardio: Regular rate and rhythm. GI: Abdomen soft and nontender.  No hepatosplenomegaly. Vascular: No leg edema. Neuro: Alert and oriented. Skin: Palms without erythema. Port-A-Cath without erythema.  Lab Results:  Lab Results  Component Value Date   WBC 8.1 05/31/2023   HGB 15.6 05/31/2023   HCT 45.7 05/31/2023   MCV 93.6 05/31/2023   PLT 269 05/31/2023   NEUTROABS 5.1 05/31/2023    Imaging:  No results found.  Medications: I have reviewed the patient's current medications.  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), status post a sigmoid colectomy and partial bladder resection 09/19/2020 2 separate sigmoid colon tumors, 2.5 cm apart, 1/13 lymph nodes positive, tumor extends to adherent soft tissue of the bladder, MSS, normal mismatch repair protein expression tumor mutation burden 0, K-ras G12D CT abdomen/pelvis 09/19/2020-sigmoid colon mass with colonic obstruction, 6 mm right lower lobe nodule CT chest 09/22/2020-multiple bilateral lung nodules consistent with metastases Elevated preoperative CEA Cycle 1 FOLFOX 10/28/2020 Cycle 2 FOLFOX 11/11/2020 Cycle 3 FOLFOX 11/25/2020 Cycle 4 FOLFOX 12/09/2020 Cycle 5 FOLFOX 12/23/2020 CT chest 12/25/2020-unchanged lung nodules, 2 cm  subpleural nodule in the left posterior chest more prominent-loculated fluid? Cycle 6 FOLFOX 02/03/2021-5-FU dose reduced and bolus eliminated secondary to diarrhea Cycle 7 FOLFOX 02/17/2021 Cycle 8 FOLFOX 03/03/2021 PET 03/17/2021 pulmonary nodules without significant change, 10 mm nodule in right middle lobe with mild FDG uptake, mildly hypermetabolic pleural nodule in the lower left hemothorax, new clustered nodularity in the apical and posterior right upper lobe-likely infectious CTs 07/23/2021-new left pleural effusion with multiple pleural nodules, increased size of several pulmonary nodules, stable left paracolic gutter nodule, morphologic features suggestive of early cirrhosis Thoracentesis 07/24/2021-1200 cc of fluid removed, rare atypical cells present Thoracentesis 627 2023-1.5 L of fluid removed, atypical cells present, acute inflammation and blood Thoracentesis 08/14/2021- 1.2 L of fluid removed, adenocarcinoma compatible with metastatic colon cancer Thoracentesis 08/21/2021-adenocarcinoma Cycle 1 FOLFIRI/bevacizumab  09/01/2021 Cycle 2 FOLFIRI/bevacizumab  09/15/2021 Cycle 3 FOLFIRI/bevacizumab  09/29/2021-Decadron  prophylaxis added for delayed nausea Cycle 4 FOLFIRI/bevacizumab  10/13/2021 Cycle 5 FOLFIRI/bevacizumab  10/27/2021 CT chest 10/28/2021-improvement in left pleural effusion, rule/parenchymal metastases-some are slightly smaller, no new lesions Cycle 6 FOLFIRI/bevacizumab  11/10/2021 Cycle 7 FOLFIRI/bevacizumab  12/01/2021 Cycle 8 FOLFIRI/bevacizumab  12/22/2021 CT chest 01/07/2022-stable pulmonary parenchymal and pleural metastatic disease. Cycle 9 FOLFIRI/bevacizumab  02/09/2022 03/02/2022 treatment discontinued due to poor tolerance Xeloda  7 days on/7 days off beginning 03/08/2022, Avastin  continued every 3 weeks CT chest 03/09/2022-no significant change in appearance of bilateral pulmonary metastasis, trace left pleural effusion, nodular liver contour Xeloda  7 days on/7 days off and Avastin   every 3 weeks CT chest 06/01/2022-mild enlargement of several pulmonary metastases, trace left pleural effusion Xeloda  7 days on/7 days off and Avastin  every 3 weeks 06/17/2022 Xeloda  resume 7 days on/7 days off 08/03/2022, declines Avastin   CTs 08/19/2022-pleural lung nodules appear to be slightly diminished in size, pulmonary parenchymal nodules appear to be slightly enlarged, overall little change.  No evidence of lymphadenopathy or metastatic disease in the abdomen or pelvis. Xeloda  continued 7 days on/7 days off 08/24/2022 Xeloda  dose reduced to 2 tablets twice daily 7 days on/7 days off beginning 10/21/2022 due to mucositis CTs 11/11/2022-pulmonary nodules have enlarged slightly from 08/19/2022.  Pleural nodularity thickening and fluid in the left lower hemithorax is similar with slight mass effect on the left hemidiaphragm. Xeloda  continued 7 days on/7 days off 11/18/2022 CTs 02/03/2023: No change in pulmonary nodules, nodular densities overlying the bladder dome have enlarged Avastin  resumed 02/15/2023 CTs 05/06/2023-no significant interval change Xeloda  7 days on/7 days off plus Avastin  every 3 weeks continued  2.  Port-A-Cath placement-Dr. 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  Vertigo April 2024 5.  MRI brain 07/20/2022-no acute or reversible finding.  No evidence of metastatic disease.  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  Disposition: Craig Best appears stable.  He continues Xeloda  7 days on/7 days off and Avastin  every 3 weeks.  Recent CEA and CTs were stable.  Plan to continue the same.  Blood pressure is mildly elevated.  He will begin amlodipine  2.5 mg daily.  CBC and chemistry panel reviewed.  Labs adequate to proceed as above.  He will return for follow-up and Avastin  in 3 weeks.  He will contact the office in  the interim with any problems.    Craig Best ANP/GNP-BC   05/31/2023  11:02 AM

## 2023-05-31 NOTE — Progress Notes (Signed)
 Patient seen by Lonna Cobb NP today  Vitals are within treatment parameters:Yes   Labs are within treatment parameters: Yes   Treatment plan has been signed: Yes   Per physician team, Patient is ready for treatment and there are NO modifications to the treatment plan.

## 2023-06-01 ENCOUNTER — Other Ambulatory Visit

## 2023-06-01 ENCOUNTER — Ambulatory Visit: Admitting: Nurse Practitioner

## 2023-06-01 ENCOUNTER — Ambulatory Visit

## 2023-06-03 ENCOUNTER — Other Ambulatory Visit: Payer: Self-pay

## 2023-06-08 ENCOUNTER — Other Ambulatory Visit: Payer: Self-pay

## 2023-06-08 ENCOUNTER — Other Ambulatory Visit: Payer: Self-pay | Admitting: Oncology

## 2023-06-08 DIAGNOSIS — C187 Malignant neoplasm of sigmoid colon: Secondary | ICD-10-CM

## 2023-06-08 MED ORDER — CAPECITABINE 500 MG PO TABS
1000.0000 mg | ORAL_TABLET | Freq: Two times a day (BID) | ORAL | 1 refills | Status: DC
  Filled 2023-06-08: qty 56, 28d supply, fill #0
  Filled 2023-07-22: qty 56, 28d supply, fill #1

## 2023-06-08 NOTE — Progress Notes (Signed)
 Specialty Pharmacy Refill Coordination Note  Craig Best is a 83 y.o. male contacted today regarding refills of specialty medication(s) Capecitabine  (XELODA )   Patient requested Delivery   Delivery date: 06/10/23   Verified address: [address] [place] [address]   Medication will be filled on 06/09/23, pending refill approval.

## 2023-06-09 ENCOUNTER — Other Ambulatory Visit: Payer: Self-pay

## 2023-06-19 ENCOUNTER — Other Ambulatory Visit: Payer: Self-pay | Admitting: Oncology

## 2023-06-21 ENCOUNTER — Inpatient Hospital Stay: Admitting: Oncology

## 2023-06-21 ENCOUNTER — Inpatient Hospital Stay

## 2023-06-21 ENCOUNTER — Inpatient Hospital Stay: Attending: Oncology

## 2023-06-21 VITALS — BP 136/69 | HR 72 | Temp 98.1°F | Resp 18 | Ht 68.0 in | Wt 207.5 lb

## 2023-06-21 DIAGNOSIS — C187 Malignant neoplasm of sigmoid colon: Secondary | ICD-10-CM | POA: Insufficient documentation

## 2023-06-21 DIAGNOSIS — C7802 Secondary malignant neoplasm of left lung: Secondary | ICD-10-CM | POA: Insufficient documentation

## 2023-06-21 DIAGNOSIS — R197 Diarrhea, unspecified: Secondary | ICD-10-CM | POA: Diagnosis not present

## 2023-06-21 DIAGNOSIS — C7801 Secondary malignant neoplasm of right lung: Secondary | ICD-10-CM | POA: Diagnosis not present

## 2023-06-21 LAB — CMP (CANCER CENTER ONLY)
ALT: 12 U/L (ref 0–44)
AST: 21 U/L (ref 15–41)
Albumin: 3.9 g/dL (ref 3.5–5.0)
Alkaline Phosphatase: 74 U/L (ref 38–126)
Anion gap: 10 (ref 5–15)
BUN: 16 mg/dL (ref 8–23)
CO2: 29 mmol/L (ref 22–32)
Calcium: 9.2 mg/dL (ref 8.9–10.3)
Chloride: 100 mmol/L (ref 98–111)
Creatinine: 0.91 mg/dL (ref 0.61–1.24)
GFR, Estimated: >60 mL/min (ref >60)
Glucose, Bld: 105 mg/dL — ABNORMAL HIGH (ref 70–99)
Potassium: 4.1 mmol/L (ref 3.5–5.1)
Sodium: 138 mmol/L (ref 135–145)
Total Bilirubin: 0.5 mg/dL (ref 0.0–1.2)
Total Protein: 7.3 g/dL (ref 6.5–8.1)

## 2023-06-21 LAB — CBC WITH DIFFERENTIAL (CANCER CENTER ONLY)
Abs Immature Granulocytes: 0.01 10*3/uL (ref 0.00–0.07)
Basophils Absolute: 0.1 10*3/uL (ref 0.0–0.1)
Basophils Relative: 1 %
Eosinophils Absolute: 0.3 10*3/uL (ref 0.0–0.5)
Eosinophils Relative: 4 %
HCT: 45.6 % (ref 39.0–52.0)
Hemoglobin: 15.6 g/dL (ref 13.0–17.0)
Immature Granulocytes: 0 %
Lymphocytes Relative: 21 %
Lymphs Abs: 1.9 10*3/uL (ref 0.7–4.0)
MCH: 31.9 pg (ref 26.0–34.0)
MCHC: 34.2 g/dL (ref 30.0–36.0)
MCV: 93.3 fL (ref 80.0–100.0)
Monocytes Absolute: 0.7 10*3/uL (ref 0.1–1.0)
Monocytes Relative: 8 %
Neutro Abs: 6.1 10*3/uL (ref 1.7–7.7)
Neutrophils Relative %: 66 %
Platelet Count: 288 10*3/uL (ref 150–400)
RBC: 4.89 MIL/uL (ref 4.22–5.81)
RDW: 14.1 % (ref 11.5–15.5)
WBC Count: 9.1 10*3/uL (ref 4.0–10.5)
nRBC: 0 % (ref 0.0–0.2)

## 2023-06-21 LAB — TOTAL PROTEIN, URINE DIPSTICK: Protein, ur: NEGATIVE mg/dL

## 2023-06-21 LAB — CEA (ACCESS): CEA (CHCC): 45.04 ng/mL — ABNORMAL HIGH (ref 0.00–5.00)

## 2023-06-21 MED ORDER — SODIUM CHLORIDE 0.9% FLUSH
10.0000 mL | INTRAVENOUS | Status: DC | PRN
  Administered 2023-06-21: 10 mL via INTRAVENOUS

## 2023-06-21 MED ORDER — HEPARIN SOD (PORK) LOCK FLUSH 100 UNIT/ML IV SOLN
500.0000 [IU] | Freq: Once | INTRAVENOUS | Status: AC
  Administered 2023-06-21: 500 [IU] via INTRAVENOUS

## 2023-06-21 NOTE — Progress Notes (Signed)
 [facility] OFFICE PROGRESS NOTE   Diagnosis: Colon cancer  INTERVAL HISTORY:   [name] returns as scheduled.  He continues capecitabine  on a 7-day on/7-day off schedule.  He was last treated with bevacizumab  05/31/2023.  He reports a headache for a few days following the bevacizumab .  He feels "terrible "intermittently for several days following bevacizumab .  No specific complaint.  No mouth sores, nausea/vomiting, bleeding, or symptom of thrombosis.  He reports improvement in dyspnea.  He has mild intermittent diarrhea.  Objective:  Vital signs in last 24 hours:  Blood pressure 136/69, pulse 72, temperature 98.1 F (36.7 C), temperature source Temporal, resp. rate 18, height 5\' 8"  (1.727 m), weight 207 lb 8 oz (94.1 kg), SpO2 98%.    HEENT: No thrush or ulcers Resp: Lungs clear bilaterally, decreased breath sounds at the left compared to the right chest, no respiratory distress Cardio: Regular rate and rhythm GI: No hepatosplenomegaly or left lower quadrant colostomy Vascular: Trace edema at the right greater than left lower leg  Skin: Palms without erythema  Portacath/PICC-without erythema, visible suture, no discharge  Lab Results:  Lab Results  Component Value Date   WBC 9.1 06/21/2023   HGB 15.6 06/21/2023   HCT 45.6 06/21/2023   MCV 93.3 06/21/2023   PLT 288 06/21/2023   NEUTROABS 6.1 06/21/2023    CMP  Lab Results  Component Value Date   NA 138 06/21/2023   K 4.1 06/21/2023   CL 100 06/21/2023   CO2 29 06/21/2023   GLUCOSE 105 (H) 06/21/2023   BUN 16 06/21/2023   CREATININE 0.91 06/21/2023   CALCIUM  9.2 06/21/2023   PROT 7.3 06/21/2023   ALBUMIN 3.9 06/21/2023   AST 21 06/21/2023   ALT 12 06/21/2023   ALKPHOS 74 06/21/2023   BILITOT 0.5 06/21/2023   GFRNONAA >60 06/21/2023    Lab Results  Component Value Date   CEA1 63.2 (H) 09/19/2020   CEA 51.38 (H) 05/31/2023     Medications: I have reviewed the patient's current  medications.   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), status post a sigmoid colectomy and partial bladder resection 09/19/2020 2 separate sigmoid colon tumors, 2.5 cm apart, 1/13 lymph nodes positive, tumor extends to adherent soft tissue of the bladder, MSS, normal mismatch repair protein expression tumor mutation burden 0, K-ras G12D CT abdomen/pelvis 09/19/2020-sigmoid colon mass with colonic obstruction, 6 mm right lower lobe nodule CT chest 09/22/2020-multiple bilateral lung nodules consistent with metastases Elevated preoperative CEA Cycle 1 FOLFOX 10/28/2020 Cycle 2 FOLFOX 11/11/2020 Cycle 3 FOLFOX 11/25/2020 Cycle 4 FOLFOX 12/09/2020 Cycle 5 FOLFOX 12/23/2020 CT chest 12/25/2020-unchanged lung nodules, 2 cm subpleural nodule in the left posterior chest more prominent-loculated fluid? Cycle 6 FOLFOX 02/03/2021-5-FU dose reduced and bolus eliminated secondary to diarrhea Cycle 7 FOLFOX 02/17/2021 Cycle 8 FOLFOX 03/03/2021 PET 03/17/2021 pulmonary nodules without significant change, 10 mm nodule in right middle lobe with mild FDG uptake, mildly hypermetabolic pleural nodule in the lower left hemothorax, new clustered nodularity in the apical and posterior right upper lobe-likely infectious CTs 07/23/2021-new left pleural effusion with multiple pleural nodules, increased size of several pulmonary nodules, stable left paracolic gutter nodule, morphologic features suggestive of early cirrhosis Thoracentesis 07/24/2021-1200 cc of fluid removed, rare atypical cells present Thoracentesis 627 2023-1.5 L of fluid removed, atypical cells present, acute inflammation and blood Thoracentesis 08/14/2021- 1.2 L of fluid removed, adenocarcinoma compatible with metastatic colon cancer Thoracentesis 08/21/2021-adenocarcinoma Cycle 1 FOLFIRI/bevacizumab  09/01/2021 Cycle 2 FOLFIRI/bevacizumab  09/15/2021 Cycle 3  FOLFIRI/bevacizumab  09/29/2021-Decadron  prophylaxis added for delayed nausea Cycle 4  FOLFIRI/bevacizumab  10/13/2021 Cycle 5 FOLFIRI/bevacizumab  10/27/2021 CT chest 10/28/2021-improvement in left pleural effusion, rule/parenchymal metastases-some are slightly smaller, no new lesions Cycle 6 FOLFIRI/bevacizumab  11/10/2021 Cycle 7 FOLFIRI/bevacizumab  12/01/2021 Cycle 8 FOLFIRI/bevacizumab  12/22/2021 CT chest 01/07/2022-stable pulmonary parenchymal and pleural metastatic disease. Cycle 9 FOLFIRI/bevacizumab  02/09/2022 03/02/2022 treatment discontinued due to poor tolerance Xeloda  7 days on/7 days off beginning 03/08/2022, Avastin  continued every 3 weeks CT chest 03/09/2022-no significant change in appearance of bilateral pulmonary metastasis, trace left pleural effusion, nodular liver contour Xeloda  7 days on/7 days off and Avastin  every 3 weeks CT chest 06/01/2022-mild enlargement of several pulmonary metastases, trace left pleural effusion Xeloda  7 days on/7 days off and Avastin  every 3 weeks 06/17/2022 Xeloda  resume 7 days on/7 days off 08/03/2022, declines Avastin  CTs 08/19/2022-pleural lung nodules appear to be slightly diminished in size, pulmonary parenchymal nodules appear to be slightly enlarged, overall little change.  No evidence of lymphadenopathy or metastatic disease in the abdomen or pelvis. Xeloda  continued 7 days on/7 days off 08/24/2022 Xeloda  dose reduced to 2 tablets twice daily 7 days on/7 days off beginning 10/21/2022 due to mucositis CTs 11/11/2022-pulmonary nodules have enlarged slightly from 08/19/2022.  Pleural nodularity thickening and fluid in the left lower hemithorax is similar with slight mass effect on the left hemidiaphragm. Xeloda  continued 7 days on/7 days off 11/18/2022 CTs 02/03/2023: No change in pulmonary nodules, nodular densities overlying the bladder dome have enlarged Avastin  resumed 02/15/2023 CTs 05/06/2023-no significant interval change Xeloda  7 days on/7 days off plus Avastin  every 3 weeks continued Avastin  held per patient request 06/21/2023  2.   Port-A-Cath placement-Dr. 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  Vertigo April 2024 5.  MRI brain 07/20/2022-no acute or reversible finding.  No evidence of metastatic disease.  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    Disposition: Mr Plumley appears stable.  He is tolerating the capecitabine  well.  He requests Avastin  be held today.  He reports headaches malaise, and feeling "terrible "after receiving Avastin .  He will continue capecitabine .  He will return for an office visit and vascular in 3 weeks.  He will undergo a restaging chest CT after the next cycle of chemotherapy.  Coni Deep, MD  06/21/2023  9:49 AM

## 2023-06-21 NOTE — Progress Notes (Signed)
 Patient has declined the IV treatment today. Has agreed to take the Xeloda 

## 2023-06-23 ENCOUNTER — Other Ambulatory Visit: Payer: Self-pay

## 2023-06-28 ENCOUNTER — Other Ambulatory Visit: Payer: Self-pay

## 2023-07-05 ENCOUNTER — Other Ambulatory Visit: Payer: Self-pay

## 2023-07-07 ENCOUNTER — Other Ambulatory Visit: Payer: Self-pay

## 2023-07-08 ENCOUNTER — Other Ambulatory Visit: Payer: Self-pay | Admitting: Nurse Practitioner

## 2023-07-08 ENCOUNTER — Other Ambulatory Visit: Payer: Self-pay

## 2023-07-08 DIAGNOSIS — C187 Malignant neoplasm of sigmoid colon: Secondary | ICD-10-CM

## 2023-07-11 ENCOUNTER — Other Ambulatory Visit: Payer: Self-pay

## 2023-07-12 ENCOUNTER — Encounter: Payer: Self-pay | Admitting: Nurse Practitioner

## 2023-07-12 ENCOUNTER — Inpatient Hospital Stay

## 2023-07-12 ENCOUNTER — Inpatient Hospital Stay: Admitting: Nurse Practitioner

## 2023-07-12 ENCOUNTER — Inpatient Hospital Stay: Attending: Oncology

## 2023-07-12 VITALS — BP 150/67 | HR 66 | Temp 98.2°F | Resp 18 | Ht 68.0 in | Wt 207.5 lb

## 2023-07-12 DIAGNOSIS — C7801 Secondary malignant neoplasm of right lung: Secondary | ICD-10-CM | POA: Insufficient documentation

## 2023-07-12 DIAGNOSIS — Z5112 Encounter for antineoplastic immunotherapy: Secondary | ICD-10-CM | POA: Diagnosis not present

## 2023-07-12 DIAGNOSIS — Z79899 Other long term (current) drug therapy: Secondary | ICD-10-CM | POA: Insufficient documentation

## 2023-07-12 DIAGNOSIS — C7802 Secondary malignant neoplasm of left lung: Secondary | ICD-10-CM | POA: Diagnosis not present

## 2023-07-12 DIAGNOSIS — C187 Malignant neoplasm of sigmoid colon: Secondary | ICD-10-CM

## 2023-07-12 DIAGNOSIS — Z95828 Presence of other vascular implants and grafts: Secondary | ICD-10-CM

## 2023-07-12 LAB — CMP (CANCER CENTER ONLY)
ALT: 12 U/L (ref 0–44)
AST: 19 U/L (ref 15–41)
Albumin: 3.9 g/dL (ref 3.5–5.0)
Alkaline Phosphatase: 78 U/L (ref 38–126)
Anion gap: 11 (ref 5–15)
BUN: 14 mg/dL (ref 8–23)
CO2: 27 mmol/L (ref 22–32)
Calcium: 9.2 mg/dL (ref 8.9–10.3)
Chloride: 100 mmol/L (ref 98–111)
Creatinine: 0.77 mg/dL (ref 0.61–1.24)
GFR, Estimated: >60 mL/min (ref >60)
Glucose, Bld: 104 mg/dL — ABNORMAL HIGH (ref 70–99)
Potassium: 3.9 mmol/L (ref 3.5–5.1)
Sodium: 138 mmol/L (ref 135–145)
Total Bilirubin: 0.5 mg/dL (ref 0.0–1.2)
Total Protein: 7.5 g/dL (ref 6.5–8.1)

## 2023-07-12 LAB — CEA (ACCESS): CEA (CHCC): 51.86 ng/mL — ABNORMAL HIGH (ref 0.00–5.00)

## 2023-07-12 LAB — CBC WITH DIFFERENTIAL (CANCER CENTER ONLY)
Abs Immature Granulocytes: 0.02 10*3/uL (ref 0.00–0.07)
Basophils Absolute: 0.1 10*3/uL (ref 0.0–0.1)
Basophils Relative: 2 %
Eosinophils Absolute: 0.4 10*3/uL (ref 0.0–0.5)
Eosinophils Relative: 5 %
HCT: 45.2 % (ref 39.0–52.0)
Hemoglobin: 15.5 g/dL (ref 13.0–17.0)
Immature Granulocytes: 0 %
Lymphocytes Relative: 23 %
Lymphs Abs: 1.7 10*3/uL (ref 0.7–4.0)
MCH: 32.3 pg (ref 26.0–34.0)
MCHC: 34.3 g/dL (ref 30.0–36.0)
MCV: 94.2 fL (ref 80.0–100.0)
Monocytes Absolute: 0.6 10*3/uL (ref 0.1–1.0)
Monocytes Relative: 8 %
Neutro Abs: 4.6 10*3/uL (ref 1.7–7.7)
Neutrophils Relative %: 62 %
Platelet Count: 255 10*3/uL (ref 150–400)
RBC: 4.8 MIL/uL (ref 4.22–5.81)
RDW: 14.2 % (ref 11.5–15.5)
WBC Count: 7.4 10*3/uL (ref 4.0–10.5)
nRBC: 0 % (ref 0.0–0.2)

## 2023-07-12 MED ORDER — HEPARIN SOD (PORK) LOCK FLUSH 100 UNIT/ML IV SOLN
500.0000 [IU] | Freq: Once | INTRAVENOUS | Status: AC
  Administered 2023-07-12: 500 [IU] via INTRAVENOUS

## 2023-07-12 MED ORDER — SODIUM CHLORIDE 0.9% FLUSH
10.0000 mL | INTRAVENOUS | Status: AC | PRN
  Administered 2023-07-12: 10 mL via INTRAVENOUS

## 2023-07-12 NOTE — Addendum Note (Signed)
 Addended by: Airanna Partin S on: 07/12/2023 09:03 AM   Modules accepted: Orders

## 2023-07-12 NOTE — Progress Notes (Signed)
 [facility] OFFICE PROGRESS NOTE   Diagnosis: Colon cancer  INTERVAL HISTORY:   Craig Best returns as scheduled.  He continues capecitabine  7 days on/7 days off.  Avastin  was held per his request on 06/21/2023 due to symptoms he attributes to Avastin .  He denies nausea/vomiting.  No mouth sores.  No diarrhea.  No hand or foot pain or redness.  He denies bleeding.  He reports recent congestion and sore throat, initial low-grade fever.  Symptoms are improving.  He has occasional ankle edema during the daytime.  The edema resolves overnight.  Objective:  Vital signs in last 24 hours:  Blood pressure (!) 150/67, pulse 66, temperature 98.2 F (36.8 C), temperature source Temporal, resp. rate 18, height 5\' 8"  (1.727 m), weight 207 lb 8 oz (94.1 kg), SpO2 99%.    HEENT: No thrush or ulcers. Resp: Lungs clear bilaterally.  Breath sounds are diminished left lower lung field compared to the right.  No respiratory distress. Cardio: Regular rate and rhythm. GI: No hepatosplenomegaly.  Left lower quadrant colostomy.  Nontender. Vascular: Trace edema lower leg bilaterally. Skin: Palms without erythema. Port-A-Cath without erythema.  Lab Results:  Lab Results  Component Value Date   WBC 7.4 07/12/2023   HGB 15.5 07/12/2023   HCT 45.2 07/12/2023   MCV 94.2 07/12/2023   PLT 255 07/12/2023   NEUTROABS 4.6 07/12/2023    Imaging:  No results found.  Medications: I have reviewed the patient's current medications.  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), status post a sigmoid colectomy and partial bladder resection 09/19/2020 2 separate sigmoid colon tumors, 2.5 cm apart, 1/13 lymph nodes positive, tumor extends to adherent soft tissue of the bladder, MSS, normal mismatch repair protein expression tumor mutation burden 0, K-ras G12D CT abdomen/pelvis 09/19/2020-sigmoid colon mass with colonic obstruction, 6 mm right lower lobe nodule CT chest  09/22/2020-multiple bilateral lung nodules consistent with metastases Elevated preoperative CEA Cycle 1 FOLFOX 10/28/2020 Cycle 2 FOLFOX 11/11/2020 Cycle 3 FOLFOX 11/25/2020 Cycle 4 FOLFOX 12/09/2020 Cycle 5 FOLFOX 12/23/2020 CT chest 12/25/2020-unchanged lung nodules, 2 cm subpleural nodule in the left posterior chest more prominent-loculated fluid? Cycle 6 FOLFOX 02/03/2021-5-FU dose reduced and bolus eliminated secondary to diarrhea Cycle 7 FOLFOX 02/17/2021 Cycle 8 FOLFOX 03/03/2021 PET 03/17/2021 pulmonary nodules without significant change, 10 mm nodule in right middle lobe with mild FDG uptake, mildly hypermetabolic pleural nodule in the lower left hemothorax, new clustered nodularity in the apical and posterior right upper lobe-likely infectious CTs 07/23/2021-new left pleural effusion with multiple pleural nodules, increased size of several pulmonary nodules, stable left paracolic gutter nodule, morphologic features suggestive of early cirrhosis Thoracentesis 07/24/2021-1200 cc of fluid removed, rare atypical cells present Thoracentesis 627 2023-1.5 L of fluid removed, atypical cells present, acute inflammation and blood Thoracentesis 08/14/2021- 1.2 L of fluid removed, adenocarcinoma compatible with metastatic colon cancer Thoracentesis 08/21/2021-adenocarcinoma Cycle 1 FOLFIRI/bevacizumab  09/01/2021 Cycle 2 FOLFIRI/bevacizumab  09/15/2021 Cycle 3 FOLFIRI/bevacizumab  09/29/2021-Decadron  prophylaxis added for delayed nausea Cycle 4 FOLFIRI/bevacizumab  10/13/2021 Cycle 5 FOLFIRI/bevacizumab  10/27/2021 CT chest 10/28/2021-improvement in left pleural effusion, rule/parenchymal metastases-some are slightly smaller, no new lesions Cycle 6 FOLFIRI/bevacizumab  11/10/2021 Cycle 7 FOLFIRI/bevacizumab  12/01/2021 Cycle 8 FOLFIRI/bevacizumab  12/22/2021 CT chest 01/07/2022-stable pulmonary parenchymal and pleural metastatic disease. Cycle 9 FOLFIRI/bevacizumab  02/09/2022 03/02/2022 treatment discontinued due to poor  tolerance Xeloda  7 days on/7 days off beginning 03/08/2022, Avastin  continued every 3 weeks CT chest 03/09/2022-no significant change in appearance of bilateral pulmonary metastasis, trace left pleural effusion, nodular liver contour Xeloda  7 days  on/7 days off and Avastin  every 3 weeks CT chest 06/01/2022-mild enlargement of several pulmonary metastases, trace left pleural effusion Xeloda  7 days on/7 days off and Avastin  every 3 weeks 06/17/2022 Xeloda  resume 7 days on/7 days off 08/03/2022, declines Avastin  CTs 08/19/2022-pleural lung nodules appear to be slightly diminished in size, pulmonary parenchymal nodules appear to be slightly enlarged, overall little change.  No evidence of lymphadenopathy or metastatic disease in the abdomen or pelvis. Xeloda  continued 7 days on/7 days off 08/24/2022 Xeloda  dose reduced to 2 tablets twice daily 7 days on/7 days off beginning 10/21/2022 due to mucositis CTs 11/11/2022-pulmonary nodules have enlarged slightly from 08/19/2022.  Pleural nodularity thickening and fluid in the left lower hemithorax is similar with slight mass effect on the left hemidiaphragm. Xeloda  continued 7 days on/7 days off 11/18/2022 CTs 02/03/2023: No change in pulmonary nodules, nodular densities overlying the bladder dome have enlarged Avastin  resumed 02/15/2023 CTs 05/06/2023-no significant interval change Xeloda  7 days on/7 days off plus Avastin  every 3 weeks continued Avastin  held per patient request 06/21/2023 Avastin  held per patient request 07/12/2023  2.  Port-A-Cath placement-Dr. 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  Vertigo April 2024 5.  MRI brain 07/20/2022-no acute or reversible finding.  No evidence of metastatic disease.  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  Disposition: [name] appears stable.  He  continues Xeloda  7 days on/7 days off.  He is tolerating well.  He declines Avastin  today.  Plan for restaging CTs prior to next office visit.  CBC and chemistry panel reviewed.  Labs adequate to continue Xeloda  as above.  He will return for follow-up, possible Avastin  in 3 weeks.    Diana Forster ANP/GNP-BC   07/12/2023  8:38 AM

## 2023-07-14 ENCOUNTER — Other Ambulatory Visit: Payer: Self-pay

## 2023-07-22 ENCOUNTER — Other Ambulatory Visit: Payer: Self-pay | Admitting: Pharmacy Technician

## 2023-07-22 ENCOUNTER — Other Ambulatory Visit: Payer: Self-pay

## 2023-07-22 NOTE — Progress Notes (Signed)
 Specialty Pharmacy Refill Coordination Note  Craig Best is a 83 y.o. male contacted today regarding refills of specialty medication(s) Capecitabine  (XELODA )   Patient requested Delivery   Delivery date: 07/25/23   Verified address: [address] [place]    Medication will be filled on 07/22/23.

## 2023-07-24 ENCOUNTER — Other Ambulatory Visit: Payer: Self-pay

## 2023-07-26 ENCOUNTER — Ambulatory Visit (HOSPITAL_BASED_OUTPATIENT_CLINIC_OR_DEPARTMENT_OTHER)
Admission: RE | Admit: 2023-07-26 | Discharge: 2023-07-26 | Disposition: A | Discharge status: Home / Self Care | Source: Ambulatory Visit | Attending: Nurse Practitioner | Admitting: Nurse Practitioner

## 2023-07-26 ENCOUNTER — Other Ambulatory Visit: Payer: Self-pay

## 2023-07-26 DIAGNOSIS — Z933 Colostomy status: Secondary | ICD-10-CM | POA: Diagnosis not present

## 2023-07-26 DIAGNOSIS — K828 Other specified diseases of gallbladder: Secondary | ICD-10-CM | POA: Diagnosis not present

## 2023-07-26 DIAGNOSIS — C189 Malignant neoplasm of colon, unspecified: Secondary | ICD-10-CM | POA: Diagnosis not present

## 2023-07-26 DIAGNOSIS — I700 Atherosclerosis of aorta: Secondary | ICD-10-CM | POA: Diagnosis not present

## 2023-07-26 DIAGNOSIS — C187 Malignant neoplasm of sigmoid colon: Secondary | ICD-10-CM | POA: Insufficient documentation

## 2023-07-26 MED ORDER — IOHEXOL 300 MG/ML  SOLN
100.0000 mL | Freq: Once | INTRAMUSCULAR | Status: AC | PRN
  Administered 2023-07-26: 100 mL via INTRAVENOUS

## 2023-07-31 ENCOUNTER — Other Ambulatory Visit: Payer: Self-pay | Admitting: Oncology

## 2023-07-31 ENCOUNTER — Other Ambulatory Visit: Payer: Self-pay

## 2023-08-02 ENCOUNTER — Inpatient Hospital Stay

## 2023-08-02 ENCOUNTER — Inpatient Hospital Stay: Admitting: Oncology

## 2023-08-02 VITALS — BP 141/61 | HR 63 | Temp 98.1°F | Resp 18 | Ht 68.0 in | Wt 203.1 lb

## 2023-08-02 DIAGNOSIS — C187 Malignant neoplasm of sigmoid colon: Secondary | ICD-10-CM | POA: Diagnosis not present

## 2023-08-02 DIAGNOSIS — Z5112 Encounter for antineoplastic immunotherapy: Secondary | ICD-10-CM | POA: Diagnosis not present

## 2023-08-02 DIAGNOSIS — C7801 Secondary malignant neoplasm of right lung: Secondary | ICD-10-CM | POA: Diagnosis not present

## 2023-08-02 DIAGNOSIS — C7802 Secondary malignant neoplasm of left lung: Secondary | ICD-10-CM | POA: Diagnosis not present

## 2023-08-02 DIAGNOSIS — Z79899 Other long term (current) drug therapy: Secondary | ICD-10-CM | POA: Diagnosis not present

## 2023-08-02 LAB — CBC WITH DIFFERENTIAL (CANCER CENTER ONLY)
Abs Immature Granulocytes: 0.02 10*3/uL (ref 0.00–0.07)
Basophils Absolute: 0.2 10*3/uL — ABNORMAL HIGH (ref 0.0–0.1)
Basophils Relative: 2 %
Eosinophils Absolute: 0.4 10*3/uL (ref 0.0–0.5)
Eosinophils Relative: 5 %
HCT: 46.1 % (ref 39.0–52.0)
Hemoglobin: 15.7 g/dL (ref 13.0–17.0)
Immature Granulocytes: 0 %
Lymphocytes Relative: 22 %
Lymphs Abs: 2 10*3/uL (ref 0.7–4.0)
MCH: 32.1 pg (ref 26.0–34.0)
MCHC: 34.1 g/dL (ref 30.0–36.0)
MCV: 94.3 fL (ref 80.0–100.0)
Monocytes Absolute: 0.7 10*3/uL (ref 0.1–1.0)
Monocytes Relative: 8 %
Neutro Abs: 5.7 10*3/uL (ref 1.7–7.7)
Neutrophils Relative %: 63 %
Platelet Count: 294 10*3/uL (ref 150–400)
RBC: 4.89 MIL/uL (ref 4.22–5.81)
RDW: 14.4 % (ref 11.5–15.5)
WBC Count: 9 10*3/uL (ref 4.0–10.5)
nRBC: 0 % (ref 0.0–0.2)

## 2023-08-02 LAB — CMP (CANCER CENTER ONLY)
ALT: 12 U/L (ref 0–44)
AST: 20 U/L (ref 15–41)
Albumin: 4.1 g/dL (ref 3.5–5.0)
Alkaline Phosphatase: 78 U/L (ref 38–126)
Anion gap: 12 (ref 5–15)
BUN: 16 mg/dL (ref 8–23)
CO2: 26 mmol/L (ref 22–32)
Calcium: 9.3 mg/dL (ref 8.9–10.3)
Chloride: 101 mmol/L (ref 98–111)
Creatinine: 0.93 mg/dL (ref 0.61–1.24)
GFR, Estimated: >60 mL/min (ref >60)
Glucose, Bld: 107 mg/dL — ABNORMAL HIGH (ref 70–99)
Potassium: 3.9 mmol/L (ref 3.5–5.1)
Sodium: 138 mmol/L (ref 135–145)
Total Bilirubin: 0.4 mg/dL (ref 0.0–1.2)
Total Protein: 7.7 g/dL (ref 6.5–8.1)

## 2023-08-02 LAB — TOTAL PROTEIN, URINE DIPSTICK: Protein, ur: NEGATIVE mg/dL

## 2023-08-02 LAB — CEA (ACCESS): CEA (CHCC): 48.43 ng/mL — ABNORMAL HIGH (ref 0.00–5.00)

## 2023-08-02 MED ORDER — ALBUTEROL SULFATE HFA 108 (90 BASE) MCG/ACT IN AERS: 2.0000 | INHALATION_SPRAY | Freq: Four times a day (QID) | RESPIRATORY_TRACT | 1 refills | Status: DC | PRN

## 2023-08-02 MED ORDER — SODIUM CHLORIDE 0.9% FLUSH
10.0000 mL | INTRAVENOUS | Status: DC | PRN
  Administered 2023-08-02: 10 mL

## 2023-08-02 MED ORDER — HEPARIN SOD (PORK) LOCK FLUSH 100 UNIT/ML IV SOLN
500.0000 [IU] | Freq: Once | INTRAVENOUS | Status: AC | PRN
  Administered 2023-08-02: 500 [IU]

## 2023-08-02 MED ORDER — SODIUM CHLORIDE 0.9 % IV SOLN: Freq: Once | INTRAVENOUS | Status: AC

## 2023-08-02 MED ORDER — SODIUM CHLORIDE 0.9 % IV SOLN
7.5000 mg/kg | Freq: Once | INTRAVENOUS | Status: AC
  Administered 2023-08-02: 700 mg via INTRAVENOUS
  Filled 2023-08-02: qty 16

## 2023-08-02 NOTE — Patient Instructions (Signed)
 [facility]  Discharge Instructions: Thank you for choosing [facility] to provide your oncology and hematology care.   If you have a lab appointment with the Cancer Center, please go directly to the Cancer Center and check in at the registration area.   Wear comfortable clothing and clothing appropriate for easy access to any Portacath or PICC line.   We strive to give you quality time with your provider. You may need to reschedule your appointment if you arrive late (15 or more minutes).  Arriving late affects you and other patients whose appointments are after yours.  Also, if you miss three or more appointments without notifying the office, you may be dismissed from the clinic at the provider's discretion.      For prescription refill requests, have your pharmacy contact our office and allow 72 hours for refills to be completed.    Today you received the following chemotherapy and/or immunotherapy agents Avastin      To help prevent nausea and vomiting after your treatment, we encourage you to take your nausea medication as directed.  BELOW ARE SYMPTOMS THAT SHOULD BE REPORTED IMMEDIATELY: *FEVER GREATER THAN 100.4 F (38 C) OR HIGHER *CHILLS OR SWEATING *NAUSEA AND VOMITING THAT IS NOT CONTROLLED WITH YOUR NAUSEA MEDICATION *UNUSUAL SHORTNESS OF BREATH *UNUSUAL BRUISING OR BLEEDING *URINARY PROBLEMS (pain or burning when urinating, or frequent urination) *BOWEL PROBLEMS (unusual diarrhea, constipation, pain near the anus) TENDERNESS IN MOUTH AND THROAT WITH OR WITHOUT PRESENCE OF ULCERS (sore throat, sores in mouth, or a toothache) UNUSUAL RASH, SWELLING OR PAIN  UNUSUAL VAGINAL DISCHARGE OR ITCHING   Items with * indicate a potential emergency and should be followed up as soon as possible or go to the Emergency Department if any problems should occur.  Please show the CHEMOTHERAPY ALERT CARD or IMMUNOTHERAPY  ALERT CARD at check-in to the Emergency Department and triage nurse.  Should you have questions after your visit or need to cancel or reschedule your appointment, please contact [facility]  Dept: [phone]  and follow the prompts.  Office hours are 8:00 a.m. to 4:30 p.m. Monday - Friday. Please note that voicemails left after 4:00 p.m. may not be returned until the following business day.  We are closed weekends and major holidays. You have access to a nurse at all times for urgent questions. Please call the main number to the clinic Dept: [phone] and follow the prompts.   For any non-urgent questions, you may also contact your provider using MyChart. We now offer e-Visits for anyone 44 and older to request care online for non-urgent symptoms. For details visit [email or web address].   Also download the MyChart app! Go to the app store, search "MyChart", open the app, select [place], and log in with your MyChart username and password.

## 2023-08-02 NOTE — Progress Notes (Signed)
 [facility] OFFICE PROGRESS NOTE   Diagnosis: Colon cancer  INTERVAL HISTORY:   Craig Best returns as scheduled.  He reports feeling well.  He continues Xeloda .  No mouth sores.  He has intermittent wheezing.  He relates this to working around hay.  Good appetite.  He last received Avastin  05/31/2023.  Objective:  Vital signs in last 24 hours:  Blood pressure (!) 141/61, pulse 63, temperature 98.1 F (36.7 C), temperature source Temporal, resp. rate 18, height 5' 8 (1.727 m), weight 203 lb 1.6 oz (92.1 kg), SpO2 98%.    HEENT: No thrush, small ecchymosis at the right buccal mucosa Resp: Decreased breath sounds at the left compared to the right chest, no respiratory distress, mild wheezing Cardio: Regular rate and rhythm GI: No hepatosplenomegaly, no mass, nontender Vascular: No leg edema  Skin: Palms without erythema  Portacath/PICC-without erythema  Lab Results:  Lab Results  Component Value Date   WBC 9.0 08/02/2023   HGB 15.7 08/02/2023   HCT 46.1 08/02/2023   MCV 94.3 08/02/2023   PLT 294 08/02/2023   NEUTROABS 5.7 08/02/2023    CMP  Lab Results  Component Value Date   NA 138 08/02/2023   K 3.9 08/02/2023   CL 101 08/02/2023   CO2 26 08/02/2023   GLUCOSE 107 (H) 08/02/2023   BUN 16 08/02/2023   CREATININE 0.93 08/02/2023   CALCIUM  9.3 08/02/2023   PROT 7.7 08/02/2023   ALBUMIN 4.1 08/02/2023   AST 20 08/02/2023   ALT 12 08/02/2023   ALKPHOS 78 08/02/2023   BILITOT 0.4 08/02/2023   GFRNONAA >60 08/02/2023    Lab Results  Component Value Date   CEA1 63.2 (H) 09/19/2020   CEA 51.86 (H) 07/12/2023   I reviewed the patient's current medications.   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), status post a sigmoid colectomy and partial bladder resection 09/19/2020 2 separate sigmoid colon tumors, 2.5 cm apart, 1/13 lymph nodes positive, tumor extends to adherent soft tissue of the bladder, MSS, normal mismatch repair  protein expression tumor mutation burden 0, K-ras G12D CT abdomen/pelvis 09/19/2020-sigmoid colon mass with colonic obstruction, 6 mm right lower lobe nodule CT chest 09/22/2020-multiple bilateral lung nodules consistent with metastases Elevated preoperative CEA Cycle 1 FOLFOX 10/28/2020 Cycle 2 FOLFOX 11/11/2020 Cycle 3 FOLFOX 11/25/2020 Cycle 4 FOLFOX 12/09/2020 Cycle 5 FOLFOX 12/23/2020 CT chest 12/25/2020-unchanged lung nodules, 2 cm subpleural nodule in the left posterior chest more prominent-loculated fluid? Cycle 6 FOLFOX 02/03/2021-5-FU dose reduced and bolus eliminated secondary to diarrhea Cycle 7 FOLFOX 02/17/2021 Cycle 8 FOLFOX 03/03/2021 PET 03/17/2021 pulmonary nodules without significant change, 10 mm nodule in right middle lobe with mild FDG uptake, mildly hypermetabolic pleural nodule in the lower left hemothorax, new clustered nodularity in the apical and posterior right upper lobe-likely infectious CTs 07/23/2021-new left pleural effusion with multiple pleural nodules, increased size of several pulmonary nodules, stable left paracolic gutter nodule, morphologic features suggestive of early cirrhosis Thoracentesis 07/24/2021-1200 cc of fluid removed, rare atypical cells present Thoracentesis 627 2023-1.5 L of fluid removed, atypical cells present, acute inflammation and blood Thoracentesis 08/14/2021- 1.2 L of fluid removed, adenocarcinoma compatible with metastatic colon cancer Thoracentesis 08/21/2021-adenocarcinoma Cycle 1 FOLFIRI/bevacizumab  09/01/2021 Cycle 2 FOLFIRI/bevacizumab  09/15/2021 Cycle 3 FOLFIRI/bevacizumab  09/29/2021-Decadron  prophylaxis added for delayed nausea Cycle 4 FOLFIRI/bevacizumab  10/13/2021 Cycle 5 FOLFIRI/bevacizumab  10/27/2021 CT chest 10/28/2021-improvement in left pleural effusion, rule/parenchymal metastases-some are slightly smaller, no new lesions Cycle 6 FOLFIRI/bevacizumab  11/10/2021 Cycle 7 FOLFIRI/bevacizumab  12/01/2021 Cycle 8 FOLFIRI/bevacizumab   12/22/2021  CT chest 01/07/2022-stable pulmonary parenchymal and pleural metastatic disease. Cycle 9 FOLFIRI/bevacizumab  02/09/2022 03/02/2022 treatment discontinued due to poor tolerance Xeloda  7 days on/7 days off beginning 03/08/2022, Avastin  continued every 3 weeks CT chest 03/09/2022-no significant change in appearance of bilateral pulmonary metastasis, trace left pleural effusion, nodular liver contour Xeloda  7 days on/7 days off and Avastin  every 3 weeks CT chest 06/01/2022-mild enlargement of several pulmonary metastases, trace left pleural effusion Xeloda  7 days on/7 days off and Avastin  every 3 weeks 06/17/2022 Xeloda  resume 7 days on/7 days off 08/03/2022, declines Avastin  CTs 08/19/2022-pleural lung nodules appear to be slightly diminished in size, pulmonary parenchymal nodules appear to be slightly enlarged, overall little change.  No evidence of lymphadenopathy or metastatic disease in the abdomen or pelvis. Xeloda  continued 7 days on/7 days off 08/24/2022 Xeloda  dose reduced to 2 tablets twice daily 7 days on/7 days off beginning 10/21/2022 due to mucositis CTs 11/11/2022-pulmonary nodules have enlarged slightly from 08/19/2022.  Pleural nodularity thickening and fluid in the left lower hemithorax is similar with slight mass effect on the left hemidiaphragm. Xeloda  continued 7 days on/7 days off 11/18/2022 CTs 02/03/2023: No change in pulmonary nodules, nodular densities overlying the bladder dome have enlarged Avastin  resumed 02/15/2023 CTs 05/06/2023-no significant interval change Xeloda  7 days on/7 days off plus Avastin  every 3 weeks continued Avastin  held per patient request 06/21/2023 Avastin  held per patient request 07/12/2023 CTs 07/26/2023: Increase in size of multiple pulmonary nodules/masses, increased soft tissue nodularity at the bladder dome (measured multiple lung lesions in the bladder dome mass, some of the lesions measure slightly larger and others are stable Avastin  08/02/2023  2.   Port-A-Cath placement-Dr. 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  Vertigo April 2024 5.  MRI brain 07/20/2022-no acute or reversible finding.  No evidence of metastatic disease.  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   Disposition: Craig Best has metastatic colon cancer.  His overall clinical status is stable.  The CEA is stable.  He has been maintained off of bevacizumab  for the past 2 months.  He feels bevacizumab  causes fatigue and headaches.  I reviewed the restaging CT findings and images with him.  Several of the lung nodules in the bladder dome mass are slightly larger.  He has been maintained off of bevacizumab  while taking single agent Xeloda .  I recommend continuing Xeloda /Avastin .  He agrees.  Will plan for a restaging chest CT in 2-3 months.  We will continue following his clinical status and the CEA.  He will complete another treatment with Avastin  today.  Mr. Grover will return for an office visit and chemotherapy in 3 weeks.  He will begin a trial of an albuterol inhaler for wheezing.  I suspect the respiratory symptoms are related to cancer as opposed to asthma.  Arley Hof, MD  08/02/2023  9:08 AM

## 2023-08-02 NOTE — Progress Notes (Signed)
 Mr. Friedt declines to take his BP medication. Patient seen by Dr. Arley Hof today  Vitals are within treatment parameters: Yes  Labs are within treatment parameters: Yes   Treatment plan has been signed: Yes   Per physician team, Patient is ready for treatment and there are NO modifications to the treatment plan.

## 2023-08-05 ENCOUNTER — Other Ambulatory Visit

## 2023-08-05 ENCOUNTER — Ambulatory Visit: Admitting: Oncology

## 2023-08-05 ENCOUNTER — Ambulatory Visit

## 2023-08-06 ENCOUNTER — Other Ambulatory Visit: Payer: Self-pay

## 2023-08-10 ENCOUNTER — Other Ambulatory Visit: Payer: Self-pay

## 2023-08-19 ENCOUNTER — Other Ambulatory Visit: Payer: Self-pay

## 2023-08-19 ENCOUNTER — Other Ambulatory Visit: Payer: Self-pay | Admitting: Oncology

## 2023-08-19 DIAGNOSIS — C187 Malignant neoplasm of sigmoid colon: Secondary | ICD-10-CM

## 2023-08-19 MED ORDER — CAPECITABINE 500 MG PO TABS
1000.0000 mg | ORAL_TABLET | Freq: Two times a day (BID) | ORAL | 1 refills | Status: DC
  Filled 2023-08-19: qty 56, 14d supply, fill #0
  Filled 2023-08-23 – 2023-09-09 (×2): qty 56, 28d supply, fill #0
  Filled 2023-10-14: qty 56, 28d supply, fill #1

## 2023-08-23 ENCOUNTER — Ambulatory Visit: Attending: Oncology

## 2023-08-23 ENCOUNTER — Inpatient Hospital Stay: Attending: Oncology | Admitting: Oncology

## 2023-08-23 ENCOUNTER — Inpatient Hospital Stay

## 2023-08-23 ENCOUNTER — Other Ambulatory Visit: Payer: Self-pay

## 2023-08-23 VITALS — BP 149/69 | HR 71 | Temp 98.3°F | Resp 18 | Ht 68.0 in | Wt 200.8 lb

## 2023-08-23 DIAGNOSIS — C187 Malignant neoplasm of sigmoid colon: Secondary | ICD-10-CM | POA: Diagnosis not present

## 2023-08-23 DIAGNOSIS — C7801 Secondary malignant neoplasm of right lung: Secondary | ICD-10-CM | POA: Insufficient documentation

## 2023-08-23 DIAGNOSIS — C7802 Secondary malignant neoplasm of left lung: Secondary | ICD-10-CM | POA: Insufficient documentation

## 2023-08-23 DIAGNOSIS — M25512 Pain in left shoulder: Secondary | ICD-10-CM | POA: Insufficient documentation

## 2023-08-23 DIAGNOSIS — Z95828 Presence of other vascular implants and grafts: Secondary | ICD-10-CM

## 2023-08-23 LAB — CMP (CANCER CENTER ONLY)
ALT: 10 U/L (ref 0–44)
AST: 20 U/L (ref 15–41)
Albumin: 4 g/dL (ref 3.5–5.0)
Alkaline Phosphatase: 78 U/L (ref 38–126)
Anion gap: 10 (ref 5–15)
BUN: 19 mg/dL (ref 8–23)
CO2: 26 mmol/L (ref 22–32)
Calcium: 9.3 mg/dL (ref 8.9–10.3)
Chloride: 100 mmol/L (ref 98–111)
Creatinine: 1.02 mg/dL (ref 0.61–1.24)
GFR, Estimated: >60 mL/min (ref >60)
Glucose, Bld: 109 mg/dL — ABNORMAL HIGH (ref 70–99)
Potassium: 3.9 mmol/L (ref 3.5–5.1)
Sodium: 137 mmol/L (ref 135–145)
Total Bilirubin: 0.5 mg/dL (ref 0.0–1.2)
Total Protein: 7.6 g/dL (ref 6.5–8.1)

## 2023-08-23 LAB — CBC WITH DIFFERENTIAL (CANCER CENTER ONLY)
Abs Immature Granulocytes: 0.02 K/uL (ref 0.00–0.07)
Basophils Absolute: 0.2 K/uL — ABNORMAL HIGH (ref 0.0–0.1)
Basophils Relative: 2 %
Eosinophils Absolute: 0.5 K/uL (ref 0.0–0.5)
Eosinophils Relative: 5 %
HCT: 46.3 % (ref 39.0–52.0)
Hemoglobin: 15.7 g/dL (ref 13.0–17.0)
Immature Granulocytes: 0 %
Lymphocytes Relative: 23 %
Lymphs Abs: 2 K/uL (ref 0.7–4.0)
MCH: 32.1 pg (ref 26.0–34.0)
MCHC: 33.9 g/dL (ref 30.0–36.0)
MCV: 94.7 fL (ref 80.0–100.0)
Monocytes Absolute: 0.7 K/uL (ref 0.1–1.0)
Monocytes Relative: 8 %
Neutro Abs: 5.3 K/uL (ref 1.7–7.7)
Neutrophils Relative %: 62 %
Platelet Count: 299 K/uL (ref 150–400)
RBC: 4.89 MIL/uL (ref 4.22–5.81)
RDW: 14 % (ref 11.5–15.5)
WBC Count: 8.7 K/uL (ref 4.0–10.5)
nRBC: 0 % (ref 0.0–0.2)

## 2023-08-23 LAB — CEA (ACCESS): CEA (CHCC): 51.93 ng/mL — ABNORMAL HIGH (ref 0.00–5.00)

## 2023-08-23 MED ORDER — HEPARIN SOD (PORK) LOCK FLUSH 100 UNIT/ML IV SOLN
500.0000 [IU] | Freq: Once | INTRAVENOUS | Status: AC
  Administered 2023-08-23: 500 [IU] via INTRAVENOUS

## 2023-08-23 MED ORDER — SODIUM CHLORIDE 0.9% FLUSH
10.0000 mL | INTRAVENOUS | Status: DC | PRN
  Administered 2023-08-23: 10 mL via INTRAVENOUS

## 2023-08-23 NOTE — Progress Notes (Signed)
 [facility] OFFICE PROGRESS NOTE   Diagnosis: Colon cancer  INTERVAL HISTORY:   Craig Best returns as scheduled.  He continues Xeloda .  He is due to start another cycle tomorrow.  No mouth sores, nausea, diarrhea, or hand/foot pain.  He is working. He last received Avastin  on 08/02/2023.  He reports a headache and malaise for several days after receiving Avastin .  He does not wish to receive Avastin  today.  He has pain at the left shoulder with abduction.  Wheezing improves when he uses the albuterol  inhaler. He relates weight loss to not eating due to the hot weather. Objective:  Vital signs in last 24 hours:  Blood pressure (!) 149/69, pulse 71, temperature 98.3 F (36.8 C), temperature source Temporal, resp. rate 18, height 5' 8 (1.727 m), weight 200 lb 12.8 oz (91.1 kg), SpO2 97%.    HEENT: No thrush or ulcers Resp: Decreased breath sounds at the left compared to the right chest, bilateral inspiratory wheeze, no respiratory distress Cardio: Regular rate and rhythm GI: No hepatosplenomegaly or left lower quadrant colostomy Vascular: No leg edema  Skin: Arms without erythema  Portacath/PICC-without erythema  Lab Results:  Lab Results  Component Value Date   WBC 8.7 08/23/2023   HGB 15.7 08/23/2023   HCT 46.3 08/23/2023   MCV 94.7 08/23/2023   PLT 299 08/23/2023   NEUTROABS 5.3 08/23/2023    CMP  Lab Results  Component Value Date   NA 138 08/02/2023   K 3.9 08/02/2023   CL 101 08/02/2023   CO2 26 08/02/2023   GLUCOSE 107 (H) 08/02/2023   BUN 16 08/02/2023   CREATININE 0.93 08/02/2023   CALCIUM  9.3 08/02/2023   PROT 7.7 08/02/2023   ALBUMIN 4.1 08/02/2023   AST 20 08/02/2023   ALT 12 08/02/2023   ALKPHOS 78 08/02/2023   BILITOT 0.4 08/02/2023   GFRNONAA >60 08/02/2023    Lab Results  Component Value Date   CEA1 63.2 (H) 09/19/2020   CEA 48.43 (H) 08/02/2023    Medications: I have reviewed the patient's current  medications.   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), status post a sigmoid colectomy and partial bladder resection 09/19/2020 2 separate sigmoid colon tumors, 2.5 cm apart, 1/13 lymph nodes positive, tumor extends to adherent soft tissue of the bladder, MSS, normal mismatch repair protein expression tumor mutation burden 0, K-ras G12D CT abdomen/pelvis 09/19/2020-sigmoid colon mass with colonic obstruction, 6 mm right lower lobe nodule CT chest 09/22/2020-multiple bilateral lung nodules consistent with metastases Elevated preoperative CEA Cycle 1 FOLFOX 10/28/2020 Cycle 2 FOLFOX 11/11/2020 Cycle 3 FOLFOX 11/25/2020 Cycle 4 FOLFOX 12/09/2020 Cycle 5 FOLFOX 12/23/2020 CT chest 12/25/2020-unchanged lung nodules, 2 cm subpleural nodule in the left posterior chest more prominent-loculated fluid? Cycle 6 FOLFOX 02/03/2021-5-FU dose reduced and bolus eliminated secondary to diarrhea Cycle 7 FOLFOX 02/17/2021 Cycle 8 FOLFOX 03/03/2021 PET 03/17/2021 pulmonary nodules without significant change, 10 mm nodule in right middle lobe with mild FDG uptake, mildly hypermetabolic pleural nodule in the lower left hemothorax, new clustered nodularity in the apical and posterior right upper lobe-likely infectious CTs 07/23/2021-new left pleural effusion with multiple pleural nodules, increased size of several pulmonary nodules, stable left paracolic gutter nodule, morphologic features suggestive of early cirrhosis Thoracentesis 07/24/2021-1200 cc of fluid removed, rare atypical cells present Thoracentesis 627 2023-1.5 L of fluid removed, atypical cells present, acute inflammation and blood Thoracentesis 08/14/2021- 1.2 L of fluid removed, adenocarcinoma compatible with metastatic colon cancer Thoracentesis 08/21/2021-adenocarcinoma Cycle 1 FOLFIRI/bevacizumab   09/01/2021 Cycle 2 FOLFIRI/bevacizumab  09/15/2021 Cycle 3 FOLFIRI/bevacizumab  09/29/2021-Decadron  prophylaxis added for delayed nausea Cycle 4  FOLFIRI/bevacizumab  10/13/2021 Cycle 5 FOLFIRI/bevacizumab  10/27/2021 CT chest 10/28/2021-improvement in left pleural effusion, rule/parenchymal metastases-some are slightly smaller, no new lesions Cycle 6 FOLFIRI/bevacizumab  11/10/2021 Cycle 7 FOLFIRI/bevacizumab  12/01/2021 Cycle 8 FOLFIRI/bevacizumab  12/22/2021 CT chest 01/07/2022-stable pulmonary parenchymal and pleural metastatic disease. Cycle 9 FOLFIRI/bevacizumab  02/09/2022 03/02/2022 treatment discontinued due to poor tolerance Xeloda  7 days on/7 days off beginning 03/08/2022, Avastin  continued every 3 weeks CT chest 03/09/2022-no significant change in appearance of bilateral pulmonary metastasis, trace left pleural effusion, nodular liver contour Xeloda  7 days on/7 days off and Avastin  every 3 weeks CT chest 06/01/2022-mild enlargement of several pulmonary metastases, trace left pleural effusion Xeloda  7 days on/7 days off and Avastin  every 3 weeks 06/17/2022 Xeloda  resume 7 days on/7 days off 08/03/2022, declines Avastin  CTs 08/19/2022-pleural lung nodules appear to be slightly diminished in size, pulmonary parenchymal nodules appear to be slightly enlarged, overall little change.  No evidence of lymphadenopathy or metastatic disease in the abdomen or pelvis. Xeloda  continued 7 days on/7 days off 08/24/2022 Xeloda  dose reduced to 2 tablets twice daily 7 days on/7 days off beginning 10/21/2022 due to mucositis CTs 11/11/2022-pulmonary nodules have enlarged slightly from 08/19/2022.  Pleural nodularity thickening and fluid in the left lower hemithorax is similar with slight mass effect on the left hemidiaphragm. Xeloda  continued 7 days on/7 days off 11/18/2022 CTs 02/03/2023: No change in pulmonary nodules, nodular densities overlying the bladder dome have enlarged Avastin  resumed 02/15/2023 CTs 05/06/2023-no significant interval change Xeloda  7 days on/7 days off plus Avastin  every 3 weeks continued Avastin  held per patient request 06/21/2023 Avastin  held  per patient request 07/12/2023 CTs 07/26/2023: Increase in size of multiple pulmonary nodules/masses, increased soft tissue nodularity at the bladder dome (measured multiple lung lesions in the bladder dome mass, some of the lesions measure slightly larger and others are stable Avastin  08/02/2023  2.  Port-A-Cath placement-Dr. 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  Vertigo April 2024 5.  MRI brain 07/20/2022-no acute or reversible finding.  No evidence of metastatic disease.  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    Disposition: Mr. Sedore appears unchanged.  He declines Avastin  today.  He will continue Xeloda .  He will return for an office visit with the plan to resume Avastin  in 3 weeks.  The left shoulder discomfort is likely related to a benign musculoskeletal condition.  We will image the left shoulder with the next chest CT.  Arley Hof, MD  08/23/2023  8:44 AM

## 2023-08-29 ENCOUNTER — Encounter: Payer: Self-pay | Admitting: Oncology

## 2023-08-30 ENCOUNTER — Other Ambulatory Visit: Payer: Self-pay

## 2023-08-31 ENCOUNTER — Other Ambulatory Visit: Payer: Self-pay

## 2023-09-09 ENCOUNTER — Other Ambulatory Visit: Payer: Self-pay

## 2023-09-09 NOTE — Progress Notes (Signed)
 Specialty Pharmacy Refill Coordination Note  Craig Best is a 83 y.o. male contacted today regarding refills of specialty medication(s) Capecitabine  (XELODA )   Patient requested Delivery   Delivery date: 09/12/23   Verified address: [address] [place]   Medication will be filled on 09/09/23.

## 2023-09-13 ENCOUNTER — Telehealth: Payer: Self-pay | Admitting: Oncology

## 2023-09-13 ENCOUNTER — Inpatient Hospital Stay

## 2023-09-13 ENCOUNTER — Inpatient Hospital Stay: Admitting: Oncology

## 2023-09-13 ENCOUNTER — Other Ambulatory Visit (HOSPITAL_COMMUNITY): Payer: Self-pay

## 2023-09-13 ENCOUNTER — Other Ambulatory Visit: Payer: Self-pay

## 2023-09-13 ENCOUNTER — Telehealth: Payer: Self-pay | Admitting: *Deleted

## 2023-09-13 NOTE — Telephone Encounter (Signed)
 Called PT to confirm date and time of rescheduled appt. Left voicemail with details.

## 2023-09-13 NOTE — Telephone Encounter (Signed)
 [name] called to cancel treatment today. Has sore throat and just does not feel well enough to come in for treatment and face the rain. Temp is low grade at 99.0. Wishes to reschedule for next week on a Tuesday. Scheduler is aware.

## 2023-09-15 ENCOUNTER — Other Ambulatory Visit: Payer: Self-pay

## 2023-09-20 ENCOUNTER — Inpatient Hospital Stay: Admitting: Oncology

## 2023-09-20 ENCOUNTER — Inpatient Hospital Stay

## 2023-09-20 ENCOUNTER — Inpatient Hospital Stay: Attending: Oncology

## 2023-09-20 ENCOUNTER — Other Ambulatory Visit: Payer: Self-pay | Admitting: *Deleted

## 2023-09-20 VITALS — BP 156/79 | HR 81 | Temp 98.5°F | Resp 18 | Ht 68.0 in | Wt 198.3 lb

## 2023-09-20 DIAGNOSIS — R062 Wheezing: Secondary | ICD-10-CM | POA: Insufficient documentation

## 2023-09-20 DIAGNOSIS — Z5112 Encounter for antineoplastic immunotherapy: Secondary | ICD-10-CM | POA: Insufficient documentation

## 2023-09-20 DIAGNOSIS — C187 Malignant neoplasm of sigmoid colon: Secondary | ICD-10-CM

## 2023-09-20 DIAGNOSIS — C7802 Secondary malignant neoplasm of left lung: Secondary | ICD-10-CM | POA: Diagnosis not present

## 2023-09-20 DIAGNOSIS — R0609 Other forms of dyspnea: Secondary | ICD-10-CM | POA: Diagnosis not present

## 2023-09-20 DIAGNOSIS — C7801 Secondary malignant neoplasm of right lung: Secondary | ICD-10-CM | POA: Insufficient documentation

## 2023-09-20 DIAGNOSIS — R059 Cough, unspecified: Secondary | ICD-10-CM | POA: Diagnosis not present

## 2023-09-20 LAB — CBC WITH DIFFERENTIAL (CANCER CENTER ONLY)
Abs Immature Granulocytes: 0.02 K/uL (ref 0.00–0.07)
Basophils Absolute: 0.2 K/uL — ABNORMAL HIGH (ref 0.0–0.1)
Basophils Relative: 2 %
Eosinophils Absolute: 0.5 K/uL (ref 0.0–0.5)
Eosinophils Relative: 6 %
HCT: 47.3 % (ref 39.0–52.0)
Hemoglobin: 16.1 g/dL (ref 13.0–17.0)
Immature Granulocytes: 0 %
Lymphocytes Relative: 21 %
Lymphs Abs: 1.9 K/uL (ref 0.7–4.0)
MCH: 32.1 pg (ref 26.0–34.0)
MCHC: 34 g/dL (ref 30.0–36.0)
MCV: 94.2 fL (ref 80.0–100.0)
Monocytes Absolute: 0.7 K/uL (ref 0.1–1.0)
Monocytes Relative: 8 %
Neutro Abs: 5.7 K/uL (ref 1.7–7.7)
Neutrophils Relative %: 63 %
Platelet Count: 268 K/uL (ref 150–400)
RBC: 5.02 MIL/uL (ref 4.22–5.81)
RDW: 13.4 % (ref 11.5–15.5)
WBC Count: 8.9 K/uL (ref 4.0–10.5)
nRBC: 0 % (ref 0.0–0.2)

## 2023-09-20 LAB — CMP (CANCER CENTER ONLY)
ALT: 12 U/L (ref 0–44)
AST: 19 U/L (ref 15–41)
Albumin: 4 g/dL (ref 3.5–5.0)
Alkaline Phosphatase: 82 U/L (ref 38–126)
Anion gap: 12 (ref 5–15)
BUN: 13 mg/dL (ref 8–23)
CO2: 28 mmol/L (ref 22–32)
Calcium: 9.5 mg/dL (ref 8.9–10.3)
Chloride: 99 mmol/L (ref 98–111)
Creatinine: 0.94 mg/dL (ref 0.61–1.24)
GFR, Estimated: >60 mL/min (ref >60)
Glucose, Bld: 111 mg/dL — ABNORMAL HIGH (ref 70–99)
Potassium: 3.7 mmol/L (ref 3.5–5.1)
Sodium: 138 mmol/L (ref 135–145)
Total Bilirubin: 0.6 mg/dL (ref 0.0–1.2)
Total Protein: 7.6 g/dL (ref 6.5–8.1)

## 2023-09-20 LAB — TOTAL PROTEIN, URINE DIPSTICK: Protein, ur: NEGATIVE mg/dL

## 2023-09-20 LAB — CEA (ACCESS): CEA (CHCC): 71.57 ng/mL — ABNORMAL HIGH (ref 0.00–5.00)

## 2023-09-20 MED ORDER — OXYCODONE HCL 5 MG PO TABS: 5.0000 mg | ORAL_TABLET | Freq: Every day | ORAL | 0 refills | Status: DC | PRN

## 2023-09-20 MED ORDER — PROCHLORPERAZINE MALEATE 10 MG PO TABS: 10.0000 mg | ORAL_TABLET | Freq: Four times a day (QID) | ORAL | 1 refills | Status: DC | PRN

## 2023-09-20 NOTE — Progress Notes (Signed)
 Called Craig Best and instructed him to hold the Zegerid on days he is on the Xeloda . When on Xeloda , take the Pepcid. Call if that is not effective. He agrees.

## 2023-09-20 NOTE — Addendum Note (Signed)
 Addended by: CLORETTA KUBA B on: 09/20/2023 11:39 AM   Modules accepted: Orders

## 2023-09-20 NOTE — Progress Notes (Signed)
 [facility] OFFICE PROGRESS NOTE   Diagnosis: Colon cancer  INTERVAL HISTORY:   Craig Best returns as scheduled.  He had increased belching last week and remained off of capecitabine .  He feels better this week, but does not wish to receive Avastin  today.  He has mild dyspnea.  He continues working.  Objective:  Vital signs in last 24 hours:  Blood pressure (!) 156/79, pulse 81, temperature 98.5 F (36.9 C), temperature source Temporal, resp. rate 18, height 5' 8 (1.727 m), weight 198 lb 4.8 oz (89.9 kg), SpO2 98%.    HEENT: No thrush or ulcers Resp: Bilateral inspiratory expiratory wheeze/rhonchi, decreased breath sounds at the left compared to the right chest, no respiratory distress Cardio: Regular rhythm with premature beats GI: No hepatosplenomegaly Vascular: No leg edema  Skin: Palms without erythema  Portacath/PICC-without erythema  Lab Results:  Lab Results  Component Value Date   WBC 8.9 09/20/2023   HGB 16.1 09/20/2023   HCT 47.3 09/20/2023   MCV 94.2 09/20/2023   PLT 268 09/20/2023   NEUTROABS 5.7 09/20/2023    CMP  Lab Results  Component Value Date   NA 138 09/20/2023   K 3.7 09/20/2023   CL 99 09/20/2023   CO2 28 09/20/2023   GLUCOSE 111 (H) 09/20/2023   BUN 13 09/20/2023   CREATININE 0.94 09/20/2023   CALCIUM  9.5 09/20/2023   PROT 7.6 09/20/2023   ALBUMIN 4.0 09/20/2023   AST 19 09/20/2023   ALT 12 09/20/2023   ALKPHOS 82 09/20/2023   BILITOT 0.6 09/20/2023   GFRNONAA >60 09/20/2023    Lab Results  Component Value Date   CEA1 63.2 (H) 09/19/2020   CEA 51.93 (H) 08/23/2023   Medications: I have reviewed the patient's current medications.   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), status post a sigmoid colectomy and partial bladder resection 09/19/2020 2 separate sigmoid colon tumors, 2.5 cm apart, 1/13 lymph nodes positive, tumor extends to adherent soft tissue of the bladder, MSS, normal  mismatch repair protein expression tumor mutation burden 0, K-ras G12D CT abdomen/pelvis 09/19/2020-sigmoid colon mass with colonic obstruction, 6 mm right lower lobe nodule CT chest 09/22/2020-multiple bilateral lung nodules consistent with metastases Elevated preoperative CEA Cycle 1 FOLFOX 10/28/2020 Cycle 2 FOLFOX 11/11/2020 Cycle 3 FOLFOX 11/25/2020 Cycle 4 FOLFOX 12/09/2020 Cycle 5 FOLFOX 12/23/2020 CT chest 12/25/2020-unchanged lung nodules, 2 cm subpleural nodule in the left posterior chest more prominent-loculated fluid? Cycle 6 FOLFOX 02/03/2021-5-FU dose reduced and bolus eliminated secondary to diarrhea Cycle 7 FOLFOX 02/17/2021 Cycle 8 FOLFOX 03/03/2021 PET 03/17/2021 pulmonary nodules without significant change, 10 mm nodule in right middle lobe with mild FDG uptake, mildly hypermetabolic pleural nodule in the lower left hemothorax, new clustered nodularity in the apical and posterior right upper lobe-likely infectious CTs 07/23/2021-new left pleural effusion with multiple pleural nodules, increased size of several pulmonary nodules, stable left paracolic gutter nodule, morphologic features suggestive of early cirrhosis Thoracentesis 07/24/2021-1200 cc of fluid removed, rare atypical cells present Thoracentesis 627 2023-1.5 L of fluid removed, atypical cells present, acute inflammation and blood Thoracentesis 08/14/2021- 1.2 L of fluid removed, adenocarcinoma compatible with metastatic colon cancer Thoracentesis 08/21/2021-adenocarcinoma Cycle 1 FOLFIRI/bevacizumab  09/01/2021 Cycle 2 FOLFIRI/bevacizumab  09/15/2021 Cycle 3 FOLFIRI/bevacizumab  09/29/2021-Decadron  prophylaxis added for delayed nausea Cycle 4 FOLFIRI/bevacizumab  10/13/2021 Cycle 5 FOLFIRI/bevacizumab  10/27/2021 CT chest 10/28/2021-improvement in left pleural effusion, rule/parenchymal metastases-some are slightly smaller, no new lesions Cycle 6 FOLFIRI/bevacizumab  11/10/2021 Cycle 7 FOLFIRI/bevacizumab  12/01/2021 Cycle 8  FOLFIRI/bevacizumab  12/22/2021 CT chest  01/07/2022-stable pulmonary parenchymal and pleural metastatic disease. Cycle 9 FOLFIRI/bevacizumab  02/09/2022 03/02/2022 treatment discontinued due to poor tolerance Xeloda  7 days on/7 days off beginning 03/08/2022, Avastin  continued every 3 weeks CT chest 03/09/2022-no significant change in appearance of bilateral pulmonary metastasis, trace left pleural effusion, nodular liver contour Xeloda  7 days on/7 days off and Avastin  every 3 weeks CT chest 06/01/2022-mild enlargement of several pulmonary metastases, trace left pleural effusion Xeloda  7 days on/7 days off and Avastin  every 3 weeks 06/17/2022 Xeloda  resume 7 days on/7 days off 08/03/2022, declines Avastin  CTs 08/19/2022-pleural lung nodules appear to be slightly diminished in size, pulmonary parenchymal nodules appear to be slightly enlarged, overall little change.  No evidence of lymphadenopathy or metastatic disease in the abdomen or pelvis. Xeloda  continued 7 days on/7 days off 08/24/2022 Xeloda  dose reduced to 2 tablets twice daily 7 days on/7 days off beginning 10/21/2022 due to mucositis CTs 11/11/2022-pulmonary nodules have enlarged slightly from 08/19/2022.  Pleural nodularity thickening and fluid in the left lower hemithorax is similar with slight mass effect on the left hemidiaphragm. Xeloda  continued 7 days on/7 days off 11/18/2022 CTs 02/03/2023: No change in pulmonary nodules, nodular densities overlying the bladder dome have enlarged Avastin  resumed 02/15/2023 CTs 05/06/2023-no significant interval change Xeloda  7 days on/7 days off plus Avastin  every 3 weeks continued Avastin  held per patient request 06/21/2023 Avastin  held per patient request 07/12/2023 CTs 07/26/2023: Increase in size of multiple pulmonary nodules/masses, increased soft tissue nodularity at the bladder dome (measured multiple lung lesions in the bladder dome mass, some of the lesions measure slightly larger and others are  stable Avastin  08/02/2023  2.  Port-A-Cath placement-Dr. 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  Vertigo April 2024 5.  MRI brain 07/20/2022-no acute or reversible finding.  No evidence of metastatic disease.  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     Disposition: Craig Best has metastatic colon cancer.  He is currently treated with Xeloda /bevacizumab .  His treatment has been intermittent for the past several months due to his preference and intercurrent illness.  He does not wish to receive bevacizumab  today.  He will resume Xeloda  on 09/22/2023.  He will return for an office visit and bevacizumab  in 2 weeks.  Craig Best has wheezing, a cough, and exertional dyspnea.  I am concerned his symptoms are related to progression of tumor in the lungs.  He will undergo a restaging CT evaluation after the next office visit.  He will call for increased dyspnea.  Arley Hof, MD  09/20/2023  10:41 AM

## 2023-09-21 ENCOUNTER — Other Ambulatory Visit: Payer: Self-pay

## 2023-09-22 ENCOUNTER — Other Ambulatory Visit: Payer: Self-pay

## 2023-09-23 ENCOUNTER — Other Ambulatory Visit: Payer: Self-pay

## 2023-09-28 ENCOUNTER — Other Ambulatory Visit: Payer: Self-pay

## 2023-09-29 ENCOUNTER — Other Ambulatory Visit: Payer: Self-pay

## 2023-10-03 ENCOUNTER — Other Ambulatory Visit: Payer: Self-pay

## 2023-10-03 ENCOUNTER — Other Ambulatory Visit (HOSPITAL_COMMUNITY): Payer: Self-pay

## 2023-10-04 ENCOUNTER — Inpatient Hospital Stay

## 2023-10-04 ENCOUNTER — Inpatient Hospital Stay (HOSPITAL_BASED_OUTPATIENT_CLINIC_OR_DEPARTMENT_OTHER): Admitting: Nurse Practitioner

## 2023-10-04 ENCOUNTER — Encounter: Payer: Self-pay | Admitting: *Deleted

## 2023-10-04 ENCOUNTER — Encounter: Payer: Self-pay | Admitting: Nurse Practitioner

## 2023-10-04 VITALS — BP 130/79 | HR 98 | Temp 98.1°F | Resp 18 | Ht 68.0 in | Wt 195.0 lb

## 2023-10-04 DIAGNOSIS — R059 Cough, unspecified: Secondary | ICD-10-CM | POA: Diagnosis not present

## 2023-10-04 DIAGNOSIS — C187 Malignant neoplasm of sigmoid colon: Secondary | ICD-10-CM

## 2023-10-04 DIAGNOSIS — R0609 Other forms of dyspnea: Secondary | ICD-10-CM | POA: Diagnosis not present

## 2023-10-04 DIAGNOSIS — R062 Wheezing: Secondary | ICD-10-CM | POA: Diagnosis not present

## 2023-10-04 DIAGNOSIS — C7801 Secondary malignant neoplasm of right lung: Secondary | ICD-10-CM | POA: Diagnosis not present

## 2023-10-04 DIAGNOSIS — C7802 Secondary malignant neoplasm of left lung: Secondary | ICD-10-CM | POA: Diagnosis not present

## 2023-10-04 DIAGNOSIS — Z5112 Encounter for antineoplastic immunotherapy: Secondary | ICD-10-CM | POA: Diagnosis not present

## 2023-10-04 LAB — CBC WITH DIFFERENTIAL (CANCER CENTER ONLY)
Abs Immature Granulocytes: 0.02 K/uL (ref 0.00–0.07)
Basophils Absolute: 0.2 K/uL — ABNORMAL HIGH (ref 0.0–0.1)
Basophils Relative: 2 %
Eosinophils Absolute: 0.3 K/uL (ref 0.0–0.5)
Eosinophils Relative: 3 %
HCT: 48 % (ref 39.0–52.0)
Hemoglobin: 16.6 g/dL (ref 13.0–17.0)
Immature Granulocytes: 0 %
Lymphocytes Relative: 22 %
Lymphs Abs: 2 K/uL (ref 0.7–4.0)
MCH: 32.4 pg (ref 26.0–34.0)
MCHC: 34.6 g/dL (ref 30.0–36.0)
MCV: 93.8 fL (ref 80.0–100.0)
Monocytes Absolute: 0.7 K/uL (ref 0.1–1.0)
Monocytes Relative: 8 %
Neutro Abs: 5.8 K/uL (ref 1.7–7.7)
Neutrophils Relative %: 65 %
Platelet Count: 333 K/uL (ref 150–400)
RBC: 5.12 MIL/uL (ref 4.22–5.81)
RDW: 13.5 % (ref 11.5–15.5)
WBC Count: 9 K/uL (ref 4.0–10.5)
nRBC: 0 % (ref 0.0–0.2)

## 2023-10-04 LAB — TOTAL PROTEIN, URINE DIPSTICK

## 2023-10-04 LAB — CMP (CANCER CENTER ONLY)
ALT: 14 U/L (ref 0–44)
AST: 22 U/L (ref 15–41)
Albumin: 4.2 g/dL (ref 3.5–5.0)
Alkaline Phosphatase: 79 U/L (ref 38–126)
Anion gap: 11 (ref 5–15)
BUN: 18 mg/dL (ref 8–23)
CO2: 27 mmol/L (ref 22–32)
Calcium: 9.9 mg/dL (ref 8.9–10.3)
Chloride: 96 mmol/L — ABNORMAL LOW (ref 98–111)
Creatinine: 0.94 mg/dL (ref 0.61–1.24)
GFR, Estimated: >60 mL/min (ref >60)
Glucose, Bld: 116 mg/dL — ABNORMAL HIGH (ref 70–99)
Potassium: 3.9 mmol/L (ref 3.5–5.1)
Sodium: 135 mmol/L (ref 135–145)
Total Bilirubin: 0.6 mg/dL (ref 0.0–1.2)
Total Protein: 7.8 g/dL (ref 6.5–8.1)

## 2023-10-04 LAB — CEA (ACCESS): CEA (CHCC): 70.09 ng/mL — ABNORMAL HIGH (ref 0.00–5.00)

## 2023-10-04 MED ORDER — AMLODIPINE BESYLATE 2.5 MG PO TABS: 2.5000 mg | ORAL_TABLET | Freq: Every day | ORAL | 2 refills | Status: AC

## 2023-10-04 MED ORDER — SODIUM CHLORIDE 0.9 % IV SOLN
7.5000 mg/kg | Freq: Once | INTRAVENOUS | Status: AC
  Administered 2023-10-04: 700 mg via INTRAVENOUS
  Filled 2023-10-04: qty 16

## 2023-10-04 MED ORDER — SODIUM CHLORIDE 0.9 % IV SOLN: Freq: Once | INTRAVENOUS | Status: AC

## 2023-10-04 NOTE — Patient Instructions (Signed)
 [facility]  Discharge Instructions: Thank you for choosing [facility] to provide your oncology and hematology care.   If you have a lab appointment with the Cancer Center, please go directly to the Cancer Center and check in at the registration area.   Wear comfortable clothing and clothing appropriate for easy access to any Portacath or PICC line.   We strive to give you quality time with your provider. You may need to reschedule your appointment if you arrive late (15 or more minutes).  Arriving late affects you and other patients whose appointments are after yours.  Also, if you miss three or more appointments without notifying the office, you may be dismissed from the clinic at the provider's discretion.      For prescription refill requests, have your pharmacy contact our office and allow 72 hours for refills to be completed.    Today you received the following chemotherapy and/or immunotherapy agents Avastin      To help prevent nausea and vomiting after your treatment, we encourage you to take your nausea medication as directed.  BELOW ARE SYMPTOMS THAT SHOULD BE REPORTED IMMEDIATELY: *FEVER GREATER THAN 100.4 F (38 C) OR HIGHER *CHILLS OR SWEATING *NAUSEA AND VOMITING THAT IS NOT CONTROLLED WITH YOUR NAUSEA MEDICATION *UNUSUAL SHORTNESS OF BREATH *UNUSUAL BRUISING OR BLEEDING *URINARY PROBLEMS (pain or burning when urinating, or frequent urination) *BOWEL PROBLEMS (unusual diarrhea, constipation, pain near the anus) TENDERNESS IN MOUTH AND THROAT WITH OR WITHOUT PRESENCE OF ULCERS (sore throat, sores in mouth, or a toothache) UNUSUAL RASH, SWELLING OR PAIN  UNUSUAL VAGINAL DISCHARGE OR ITCHING   Items with * indicate a potential emergency and should be followed up as soon as possible or go to the Emergency Department if any problems should occur.  Please show the CHEMOTHERAPY ALERT CARD or IMMUNOTHERAPY  ALERT CARD at check-in to the Emergency Department and triage nurse.  Should you have questions after your visit or need to cancel or reschedule your appointment, please contact [facility]  Dept: [phone]  and follow the prompts.  Office hours are 8:00 a.m. to 4:30 p.m. Monday - Friday. Please note that voicemails left after 4:00 p.m. may not be returned until the following business day.  We are closed weekends and major holidays. You have access to a nurse at all times for urgent questions. Please call the main number to the clinic Dept: [phone] and follow the prompts.   For any non-urgent questions, you may also contact your provider using MyChart. We now offer e-Visits for anyone 44 and older to request care online for non-urgent symptoms. For details visit [email or web address].   Also download the MyChart app! Go to the app store, search "MyChart", open the app, select [place], and log in with your MyChart username and password.

## 2023-10-04 NOTE — Progress Notes (Signed)
 [facility] OFFICE PROGRESS NOTE   Diagnosis:  Colon cancer  INTERVAL HISTORY:   Craig Best returns as scheduled.  He is feeling better.  He denies nausea/vomiting.  No mouth sores.  No diarrhea.  No hand or foot pain or redness.  Belching has improved significantly over the past 4 days.  Stable mild dyspnea.  He denies bleeding.  No abdominal pain.  Objective:  Vital signs in last 24 hours:  Blood pressure 130/79, pulse 98, temperature 98.1 F (36.7 C), resp. rate 18, height 5' 8 (1.727 m), weight 195 lb (88.5 kg), SpO2 96%.    HEENT: No thrush or ulcers. Resp: Breath sounds diminished left lower lung field.  No respiratory distress. Cardio: Regular rate and rhythm. GI: Abdomen soft and nontender.  No hepatosplenomegaly.  Left lower quadrant colostomy. Vascular: No leg edema. Neuro: Alert and oriented. Skin: Palms without erythema. Port-A-Cath without erythema.  Lab Results:  Lab Results  Component Value Date   WBC 8.9 09/20/2023   HGB 16.1 09/20/2023   HCT 47.3 09/20/2023   MCV 94.2 09/20/2023   PLT 268 09/20/2023   NEUTROABS 5.7 09/20/2023    Imaging:  No results found.  Medications: I have reviewed the patient's current medications.  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), status post a sigmoid colectomy and partial bladder resection 09/19/2020 2 separate sigmoid colon tumors, 2.5 cm apart, 1/13 lymph nodes positive, tumor extends to adherent soft tissue of the bladder, MSS, normal mismatch repair protein expression tumor mutation burden 0, K-ras G12D CT abdomen/pelvis 09/19/2020-sigmoid colon mass with colonic obstruction, 6 mm right lower lobe nodule CT chest 09/22/2020-multiple bilateral lung nodules consistent with metastases Elevated preoperative CEA Cycle 1 FOLFOX 10/28/2020 Cycle 2 FOLFOX 11/11/2020 Cycle 3 FOLFOX 11/25/2020 Cycle 4 FOLFOX 12/09/2020 Cycle 5 FOLFOX 12/23/2020 CT chest 12/25/2020-unchanged lung  nodules, 2 cm subpleural nodule in the left posterior chest more prominent-loculated fluid? Cycle 6 FOLFOX 02/03/2021-5-FU dose reduced and bolus eliminated secondary to diarrhea Cycle 7 FOLFOX 02/17/2021 Cycle 8 FOLFOX 03/03/2021 PET 03/17/2021 pulmonary nodules without significant change, 10 mm nodule in right middle lobe with mild FDG uptake, mildly hypermetabolic pleural nodule in the lower left hemothorax, new clustered nodularity in the apical and posterior right upper lobe-likely infectious CTs 07/23/2021-new left pleural effusion with multiple pleural nodules, increased size of several pulmonary nodules, stable left paracolic gutter nodule, morphologic features suggestive of early cirrhosis Thoracentesis 07/24/2021-1200 cc of fluid removed, rare atypical cells present Thoracentesis 627 2023-1.5 L of fluid removed, atypical cells present, acute inflammation and blood Thoracentesis 08/14/2021- 1.2 L of fluid removed, adenocarcinoma compatible with metastatic colon cancer Thoracentesis 08/21/2021-adenocarcinoma Cycle 1 FOLFIRI/bevacizumab  09/01/2021 Cycle 2 FOLFIRI/bevacizumab  09/15/2021 Cycle 3 FOLFIRI/bevacizumab  09/29/2021-Decadron  prophylaxis added for delayed nausea Cycle 4 FOLFIRI/bevacizumab  10/13/2021 Cycle 5 FOLFIRI/bevacizumab  10/27/2021 CT chest 10/28/2021-improvement in left pleural effusion, rule/parenchymal metastases-some are slightly smaller, no new lesions Cycle 6 FOLFIRI/bevacizumab  11/10/2021 Cycle 7 FOLFIRI/bevacizumab  12/01/2021 Cycle 8 FOLFIRI/bevacizumab  12/22/2021 CT chest 01/07/2022-stable pulmonary parenchymal and pleural metastatic disease. Cycle 9 FOLFIRI/bevacizumab  02/09/2022 03/02/2022 treatment discontinued due to poor tolerance Xeloda  7 days on/7 days off beginning 03/08/2022, Avastin  continued every 3 weeks CT chest 03/09/2022-no significant change in appearance of bilateral pulmonary metastasis, trace left pleural effusion, nodular liver contour Xeloda  7 days on/7 days off  and Avastin  every 3 weeks CT chest 06/01/2022-mild enlargement of several pulmonary metastases, trace left pleural effusion Xeloda  7 days on/7 days off and Avastin  every 3 weeks 06/17/2022 Xeloda  resume 7 days on/7 days off 08/03/2022,  declines Avastin  CTs 08/19/2022-pleural lung nodules appear to be slightly diminished in size, pulmonary parenchymal nodules appear to be slightly enlarged, overall little change.  No evidence of lymphadenopathy or metastatic disease in the abdomen or pelvis. Xeloda  continued 7 days on/7 days off 08/24/2022 Xeloda  dose reduced to 2 tablets twice daily 7 days on/7 days off beginning 10/21/2022 due to mucositis CTs 11/11/2022-pulmonary nodules have enlarged slightly from 08/19/2022.  Pleural nodularity thickening and fluid in the left lower hemithorax is similar with slight mass effect on the left hemidiaphragm. Xeloda  continued 7 days on/7 days off 11/18/2022 CTs 02/03/2023: No change in pulmonary nodules, nodular densities overlying the bladder dome have enlarged Avastin  resumed 02/15/2023 CTs 05/06/2023-no significant interval change Xeloda  7 days on/7 days off plus Avastin  every 3 weeks continued Avastin  held per patient request 06/21/2023 Avastin  held per patient request 07/12/2023 CTs 07/26/2023: Increase in size of multiple pulmonary nodules/masses, increased soft tissue nodularity at the bladder dome (measured multiple lung lesions in the bladder dome mass, some of the lesions measure slightly larger and others are stable Avastin  08/02/2023  2.  Port-A-Cath placement-Dr. 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  Vertigo April 2024 5.  MRI brain 07/20/2022-no acute or reversible finding.  No evidence of metastatic disease.  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      Disposition: Craig Best appears  stable.  He is on active treatment with Xeloda /bevacizumab .  Treatment has been intermittent for the past several months.  He agrees to resume both Xeloda  and bevacizumab  today.  Restaging CTs before next office visit.  CBC and chemistry panel reviewed.  Labs adequate for treatment.  He will return for follow-up in 3 weeks.  We are available to see him sooner if needed.    Olam Ned ANP/GNP-BC   10/04/2023  8:14 AM

## 2023-10-04 NOTE — Progress Notes (Signed)
 Patient seen by Olam Ned NP today  Vitals are within treatment parameters:Yes   Labs are within treatment parameters: Yes   Treatment plan has been signed: Yes   Per physician team, Patient is ready for treatment and there are NO modifications to the treatment plan.

## 2023-10-04 NOTE — Progress Notes (Signed)
 Referral faxed to Dr Ronal Ferraris [facility] at [phone] per pt request

## 2023-10-05 ENCOUNTER — Other Ambulatory Visit: Payer: Self-pay

## 2023-10-06 ENCOUNTER — Other Ambulatory Visit: Payer: Self-pay

## 2023-10-06 ENCOUNTER — Other Ambulatory Visit: Payer: Self-pay | Admitting: Oncology

## 2023-10-13 DIAGNOSIS — R142 Eructation: Secondary | ICD-10-CM | POA: Diagnosis not present

## 2023-10-13 DIAGNOSIS — R1310 Dysphagia, unspecified: Secondary | ICD-10-CM | POA: Diagnosis not present

## 2023-10-13 DIAGNOSIS — C187 Malignant neoplasm of sigmoid colon: Secondary | ICD-10-CM | POA: Diagnosis not present

## 2023-10-13 DIAGNOSIS — R970 Elevated carcinoembryonic antigen [CEA]: Secondary | ICD-10-CM | POA: Diagnosis not present

## 2023-10-14 ENCOUNTER — Other Ambulatory Visit: Payer: Self-pay

## 2023-10-14 ENCOUNTER — Other Ambulatory Visit: Payer: Self-pay | Admitting: Pharmacy Technician

## 2023-10-14 NOTE — Progress Notes (Signed)
 Specialty Pharmacy Ongoing Clinical Assessment Note  Craig Best is a 83 y.o. male who is being followed by the specialty pharmacy service for RxSp Oncology   Patient's specialty medication(s) reviewed today: Capecitabine  (XELODA )   Missed doses in the last 4 weeks: 0   Patient/Caregiver did not have any additional questions or concerns.   Therapeutic benefit summary: Patient is achieving benefit   Adverse events/side effects summary: No adverse events/side effects   Patient's therapy is appropriate to: Continue    Goals Addressed             This Visit's Progress    Stabilization of disease       Patient is on track. Patient will maintain adherence. Restaging CT's scheduled for next week.          Follow up: 6 months  Craig Best M Donna Snooks Specialty Pharmacist

## 2023-10-14 NOTE — Progress Notes (Signed)
 Specialty Pharmacy Refill Coordination Note  Craig Best is a 83 y.o. male contacted today regarding refills of specialty medication(s) Capecitabine  (XELODA )   Patient requested Delivery   Delivery date: 10/17/23   Verified address: [address]   [place] [address]   Medication will be filled on 10/14/23.   Spoke to Mrs. Fortunato; currently on break will resume on 9/10

## 2023-10-16 ENCOUNTER — Other Ambulatory Visit: Payer: Self-pay

## 2023-10-20 ENCOUNTER — Ambulatory Visit (HOSPITAL_BASED_OUTPATIENT_CLINIC_OR_DEPARTMENT_OTHER)
Admission: RE | Admit: 2023-10-20 | Discharge: 2023-10-20 | Disposition: A | Discharge status: Home / Self Care | Source: Ambulatory Visit | Attending: Nurse Practitioner | Admitting: Nurse Practitioner

## 2023-10-20 DIAGNOSIS — C19 Malignant neoplasm of rectosigmoid junction: Secondary | ICD-10-CM | POA: Diagnosis not present

## 2023-10-20 DIAGNOSIS — I700 Atherosclerosis of aorta: Secondary | ICD-10-CM | POA: Diagnosis not present

## 2023-10-20 DIAGNOSIS — C187 Malignant neoplasm of sigmoid colon: Secondary | ICD-10-CM | POA: Diagnosis not present

## 2023-10-20 DIAGNOSIS — R918 Other nonspecific abnormal finding of lung field: Secondary | ICD-10-CM | POA: Diagnosis not present

## 2023-10-20 MED ORDER — IOHEXOL 300 MG/ML  SOLN
100.0000 mL | Freq: Once | INTRAMUSCULAR | Status: AC | PRN
  Administered 2023-10-20: 100 mL via INTRAVENOUS

## 2023-10-20 MED ORDER — HEPARIN SOD (PORK) LOCK FLUSH 100 UNIT/ML IV SOLN
500.0000 [IU] | Freq: Once | INTRAVENOUS | Status: AC
  Administered 2023-10-20: 500 [IU] via INTRAVENOUS

## 2023-10-24 ENCOUNTER — Other Ambulatory Visit: Payer: Self-pay | Admitting: Oncology

## 2023-10-25 ENCOUNTER — Encounter: Payer: Self-pay | Admitting: Nurse Practitioner

## 2023-10-25 ENCOUNTER — Inpatient Hospital Stay

## 2023-10-25 ENCOUNTER — Inpatient Hospital Stay (HOSPITAL_BASED_OUTPATIENT_CLINIC_OR_DEPARTMENT_OTHER): Admitting: Nurse Practitioner

## 2023-10-25 ENCOUNTER — Inpatient Hospital Stay: Attending: Oncology

## 2023-10-25 VITALS — BP 141/77 | HR 74 | Temp 98.2°F | Resp 18 | Ht 68.0 in | Wt 195.5 lb

## 2023-10-25 DIAGNOSIS — C7802 Secondary malignant neoplasm of left lung: Secondary | ICD-10-CM | POA: Insufficient documentation

## 2023-10-25 DIAGNOSIS — C187 Malignant neoplasm of sigmoid colon: Secondary | ICD-10-CM

## 2023-10-25 DIAGNOSIS — C7801 Secondary malignant neoplasm of right lung: Secondary | ICD-10-CM | POA: Diagnosis not present

## 2023-10-25 LAB — CBC WITH DIFFERENTIAL (CANCER CENTER ONLY)
Abs Immature Granulocytes: 0.02 K/uL (ref 0.00–0.07)
Basophils Absolute: 0.2 K/uL — ABNORMAL HIGH (ref 0.0–0.1)
Basophils Relative: 2 %
Eosinophils Absolute: 0.4 K/uL (ref 0.0–0.5)
Eosinophils Relative: 6 %
HCT: 45.7 % (ref 39.0–52.0)
Hemoglobin: 15.7 g/dL (ref 13.0–17.0)
Immature Granulocytes: 0 %
Lymphocytes Relative: 21 %
Lymphs Abs: 1.7 K/uL (ref 0.7–4.0)
MCH: 32 pg (ref 26.0–34.0)
MCHC: 34.4 g/dL (ref 30.0–36.0)
MCV: 93.1 fL (ref 80.0–100.0)
Monocytes Absolute: 0.5 K/uL (ref 0.1–1.0)
Monocytes Relative: 7 %
Neutro Abs: 5.1 K/uL (ref 1.7–7.7)
Neutrophils Relative %: 64 %
Platelet Count: 290 K/uL (ref 150–400)
RBC: 4.91 MIL/uL (ref 4.22–5.81)
RDW: 13.3 % (ref 11.5–15.5)
WBC Count: 7.9 K/uL (ref 4.0–10.5)
nRBC: 0 % (ref 0.0–0.2)

## 2023-10-25 LAB — CMP (CANCER CENTER ONLY)
ALT: 12 U/L (ref 0–44)
AST: 21 U/L (ref 15–41)
Albumin: 4.1 g/dL (ref 3.5–5.0)
Alkaline Phosphatase: 86 U/L (ref 38–126)
Anion gap: 12 (ref 5–15)
BUN: 12 mg/dL (ref 8–23)
CO2: 26 mmol/L (ref 22–32)
Calcium: 9.4 mg/dL (ref 8.9–10.3)
Chloride: 101 mmol/L (ref 98–111)
Creatinine: 0.81 mg/dL (ref 0.61–1.24)
GFR, Estimated: >60 mL/min (ref >60)
Glucose, Bld: 120 mg/dL — ABNORMAL HIGH (ref 70–99)
Potassium: 3.9 mmol/L (ref 3.5–5.1)
Sodium: 138 mmol/L (ref 135–145)
Total Bilirubin: 0.4 mg/dL (ref 0.0–1.2)
Total Protein: 7.8 g/dL (ref 6.5–8.1)

## 2023-10-25 LAB — TOTAL PROTEIN, URINE DIPSTICK: Protein, ur: NEGATIVE mg/dL

## 2023-10-25 LAB — CEA (ACCESS): CEA (CHCC): 76.64 ng/mL — ABNORMAL HIGH (ref 0.00–5.00)

## 2023-10-25 NOTE — Patient Instructions (Signed)

## 2023-10-25 NOTE — Progress Notes (Signed)
 Patient seen by Olam Ned NP today  Vitals are within treatment parameters:Yes   Labs are within treatment parameters: Yes   Treatment plan has been signed: No   Per physician team, Patient will not be receiving treatment today.

## 2023-10-25 NOTE — Progress Notes (Signed)
 [facility] OFFICE PROGRESS NOTE   Diagnosis: Colon cancer  INTERVAL HISTORY:   Mr. Caruth returns as scheduled.  He resumed Xeloda  7 days on/7 days off and every 3-week bevacizumab  10/04/2023.  He had nausea and belching for 6 to 7 days after day 1.  No mouth sores.  No diarrhea.  No hand or foot pain or redness.  Toes were numb for a few days.  Blood pressure increased for 3 to 4 days, max reading 200/100.  He has had felt funny.  He noted increased abdominal pain after bevacizumab .  No bleeding.  His fingers turned white.  He feels bevacizumab  is responsible for the symptoms he experienced.  He feels well today.  Objective:  Vital signs in last 24 hours:  Blood pressure (!) 141/77, pulse 74, temperature 98.2 F (36.8 C), temperature source Temporal, resp. rate 18, height 5' 8 (1.727 m), weight 195 lb 8 oz (88.7 kg), SpO2 100%.    HEENT: No thrush or ulcers. Resp: Breath sounds diminished left lower lung field.  No respiratory distress. Cardio: Regular rate and rhythm. GI: No hepatosplenomegaly.  Left lower quadrant colostomy. Vascular: No leg edema. Neuro: Alert and oriented. Skin: Palms without erythema. Port-A-Cath without erythema.  Lab Results:  Lab Results  Component Value Date   WBC 7.9 10/25/2023   HGB 15.7 10/25/2023   HCT 45.7 10/25/2023   MCV 93.1 10/25/2023   PLT 290 10/25/2023   NEUTROABS 5.1 10/25/2023    Imaging:  No results found.  Medications: I have reviewed the patient's current medications.  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), status post a sigmoid colectomy and partial bladder resection 09/19/2020 2 separate sigmoid colon tumors, 2.5 cm apart, 1/13 lymph nodes positive, tumor extends to adherent soft tissue of the bladder, MSS, normal mismatch repair protein expression tumor mutation burden 0, K-ras G12D CT abdomen/pelvis 09/19/2020-sigmoid colon mass with colonic obstruction, 6 mm right lower lobe  nodule CT chest 09/22/2020-multiple bilateral lung nodules consistent with metastases Elevated preoperative CEA Cycle 1 FOLFOX 10/28/2020 Cycle 2 FOLFOX 11/11/2020 Cycle 3 FOLFOX 11/25/2020 Cycle 4 FOLFOX 12/09/2020 Cycle 5 FOLFOX 12/23/2020 CT chest 12/25/2020-unchanged lung nodules, 2 cm subpleural nodule in the left posterior chest more prominent-loculated fluid? Cycle 6 FOLFOX 02/03/2021-5-FU dose reduced and bolus eliminated secondary to diarrhea Cycle 7 FOLFOX 02/17/2021 Cycle 8 FOLFOX 03/03/2021 PET 03/17/2021 pulmonary nodules without significant change, 10 mm nodule in right middle lobe with mild FDG uptake, mildly hypermetabolic pleural nodule in the lower left hemothorax, new clustered nodularity in the apical and posterior right upper lobe-likely infectious CTs 07/23/2021-new left pleural effusion with multiple pleural nodules, increased size of several pulmonary nodules, stable left paracolic gutter nodule, morphologic features suggestive of early cirrhosis Thoracentesis 07/24/2021-1200 cc of fluid removed, rare atypical cells present Thoracentesis 627 2023-1.5 L of fluid removed, atypical cells present, acute inflammation and blood Thoracentesis 08/14/2021- 1.2 L of fluid removed, adenocarcinoma compatible with metastatic colon cancer Thoracentesis 08/21/2021-adenocarcinoma Cycle 1 FOLFIRI/bevacizumab  09/01/2021 Cycle 2 FOLFIRI/bevacizumab  09/15/2021 Cycle 3 FOLFIRI/bevacizumab  09/29/2021-Decadron  prophylaxis added for delayed nausea Cycle 4 FOLFIRI/bevacizumab  10/13/2021 Cycle 5 FOLFIRI/bevacizumab  10/27/2021 CT chest 10/28/2021-improvement in left pleural effusion, rule/parenchymal metastases-some are slightly smaller, no new lesions Cycle 6 FOLFIRI/bevacizumab  11/10/2021 Cycle 7 FOLFIRI/bevacizumab  12/01/2021 Cycle 8 FOLFIRI/bevacizumab  12/22/2021 CT chest 01/07/2022-stable pulmonary parenchymal and pleural metastatic disease. Cycle 9 FOLFIRI/bevacizumab  02/09/2022 03/02/2022 treatment  discontinued due to poor tolerance Xeloda  7 days on/7 days off beginning 03/08/2022, Avastin  continued every 3 weeks CT chest 03/09/2022-no significant change in  appearance of bilateral pulmonary metastasis, trace left pleural effusion, nodular liver contour Xeloda  7 days on/7 days off and Avastin  every 3 weeks CT chest 06/01/2022-mild enlargement of several pulmonary metastases, trace left pleural effusion Xeloda  7 days on/7 days off and Avastin  every 3 weeks 06/17/2022 Xeloda  resume 7 days on/7 days off 08/03/2022, declines Avastin  CTs 08/19/2022-pleural lung nodules appear to be slightly diminished in size, pulmonary parenchymal nodules appear to be slightly enlarged, overall little change.  No evidence of lymphadenopathy or metastatic disease in the abdomen or pelvis. Xeloda  continued 7 days on/7 days off 08/24/2022 Xeloda  dose reduced to 2 tablets twice daily 7 days on/7 days off beginning 10/21/2022 due to mucositis CTs 11/11/2022-pulmonary nodules have enlarged slightly from 08/19/2022.  Pleural nodularity thickening and fluid in the left lower hemithorax is similar with slight mass effect on the left hemidiaphragm. Xeloda  continued 7 days on/7 days off 11/18/2022 CTs 02/03/2023: No change in pulmonary nodules, nodular densities overlying the bladder dome have enlarged Avastin  resumed 02/15/2023 CTs 05/06/2023-no significant interval change Xeloda  7 days on/7 days off plus Avastin  every 3 weeks continued Avastin  held per patient request 06/21/2023 Avastin  held per patient request 07/12/2023 CTs 07/26/2023: Increase in size of multiple pulmonary nodules/masses, increased soft tissue nodularity at the bladder dome (measured multiple lung lesions in the bladder dome mass, some of the lesions measure slightly larger and others are stable Avastin  08/02/2023 Xeloda  7 days on/7 days off plus Avastin  every 3 weeks 10/04/2023 CTs 10/20/2023-stable bilateral pulmonary nodules and masses.  No new pulmonary nodules.  No  mediastinal adenopathy.  Stable nodular thickening along the dome of the bladder.  Stable nodular peritoneal thickening left paracolic gutter.  No new or progressive metastatic disease in the abdomen/pelvis.  2.  Port-A-Cath placement-Dr. 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  Vertigo April 2024 5.  MRI brain 07/20/2022-no acute or reversible finding.  No evidence of metastatic disease.  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    Disposition: Mr. Strine appears stable.  He resumed Xeloda  7 days on/7 days off plus bevacizumab  10/04/2023.  He experienced multiple symptoms during the first week.  He feels the symptoms are due to bevacizumab  rather than Xeloda .  He declines further bevacizumab .  He agrees to continue Xeloda  7 days on/7 days off.  Recent restaging CTs show stable to minimally increased disease.  Results/images reviewed with him and his wife at today's visit.  As above he would like to continue Xeloda  on the current schedule, declines further bevacizumab  for now.  He will return for lab and follow-up in approximately 4 weeks.  We are available to see him sooner if needed.  Patient seen with Dr. Cloretta.    Olam Ned ANP/GNP-BC   10/25/2023  8:59 AM This was a shared visit with Olam Ned.  Mr. Mcgilvery has metastatic colon cancer.  We reviewed the restaging CT findings and images with Mr. Mcdill.  The overall pattern is consistent with stable disease, but several of the lung lesions measure slightly larger and the CEA is higher.  He would like to continue treatment with single agent capecitabine .  He had multiple symptoms following the last treatment with bevacizumab  and declines further bevacizumab .  He will return for an office visit in 4 weeks.  He has left shoulder discomfort.  I see no obvious bony  abnormality at the left  shoulder on the available CT images.  I was present for greater than 50% of today's visit.  I performed medical decision making.  Arvella Hof, MD

## 2023-10-26 ENCOUNTER — Other Ambulatory Visit: Payer: Self-pay

## 2023-10-27 ENCOUNTER — Other Ambulatory Visit: Payer: Self-pay

## 2023-10-28 ENCOUNTER — Other Ambulatory Visit: Payer: Self-pay

## 2023-11-03 ENCOUNTER — Other Ambulatory Visit: Payer: Self-pay

## 2023-11-03 ENCOUNTER — Other Ambulatory Visit: Payer: Self-pay | Admitting: Oncology

## 2023-11-03 ENCOUNTER — Other Ambulatory Visit (HOSPITAL_COMMUNITY): Payer: Self-pay

## 2023-11-03 DIAGNOSIS — C187 Malignant neoplasm of sigmoid colon: Secondary | ICD-10-CM

## 2023-11-03 MED ORDER — CAPECITABINE 500 MG PO TABS
1000.0000 mg | ORAL_TABLET | Freq: Two times a day (BID) | ORAL | 1 refills | Status: DC
  Filled 2023-11-03: qty 56, 14d supply, fill #0
  Filled 2023-11-07 – 2023-11-17 (×2): qty 56, 28d supply, fill #0
  Filled 2023-12-09 – 2023-12-20 (×2): qty 56, 28d supply, fill #1

## 2023-11-04 ENCOUNTER — Other Ambulatory Visit: Payer: Self-pay

## 2023-11-07 ENCOUNTER — Other Ambulatory Visit: Payer: Self-pay

## 2023-11-08 ENCOUNTER — Ambulatory Visit (INDEPENDENT_AMBULATORY_CARE_PROVIDER_SITE_OTHER): Admitting: Student

## 2023-11-08 ENCOUNTER — Ambulatory Visit (HOSPITAL_BASED_OUTPATIENT_CLINIC_OR_DEPARTMENT_OTHER)

## 2023-11-08 DIAGNOSIS — M19012 Primary osteoarthritis, left shoulder: Secondary | ICD-10-CM | POA: Diagnosis not present

## 2023-11-08 DIAGNOSIS — G8929 Other chronic pain: Secondary | ICD-10-CM | POA: Diagnosis not present

## 2023-11-08 DIAGNOSIS — M25512 Pain in left shoulder: Secondary | ICD-10-CM

## 2023-11-08 MED ORDER — LIDOCAINE HCL 1 % IJ SOLN
4.0000 mL | INTRAMUSCULAR | Status: AC | PRN
  Administered 2023-11-08: 4 mL

## 2023-11-08 MED ORDER — TRIAMCINOLONE ACETONIDE 40 MG/ML IJ SUSP
2.0000 mL | INTRAMUSCULAR | Status: AC | PRN
  Administered 2023-11-08: 2 mL via INTRA_ARTICULAR

## 2023-11-08 NOTE — Progress Notes (Signed)
 Chief Complaint: Left shoulder pain    Discussed the use of AI scribe software for clinical note transcription with the patient, who gave verbal consent to proceed.  History of Present Illness Craig Best is an 83 year old male with a history of colon cancer with metastases who presents with left shoulder pain. Left shoulder pain has been present for approximately six months, with symptoms persisting for about a year. The pain is exacerbated by arm elevation and when lying on the affected side. It is described as a sore feeling, particularly in the anterior shoulder, with pain radiating down the arm during certain movements like supination. There is no pain in the right shoulder or at rest. 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 A port remains functional without infection or issues.    Surgical History:   None  PMH/PSH/Family History/Social History/Meds/Allergies:    Past Medical History:  Diagnosis Date   Cancer (HCC) 09/19/2020   adenocarcinoma sigmoid colon   Past Surgical History:  Procedure Laterality Date   IR THORACENTESIS ASP PLEURAL SPACE W/IMG GUIDE  08/14/2021   IR THORACENTESIS ASP PLEURAL SPACE W/IMG GUIDE  08/21/2021   PARTIAL COLECTOMY N/A 09/19/2020   Procedure: PARTIAL COLECTOMY, WITH CREATION OF COLOSTOMY, COMPLEX BLADDER REPAIR;  Surgeon: Vernetta Berg, MD;  Location: [facility];  Service: General;  Laterality: N/A;   PORTACATH PLACEMENT Left 10/07/2020   Procedure: PORT-A-CATH INSERTION;  Surgeon: Vernetta Berg, MD;  Location: [facility];  Service: General;  Laterality: Left;   Social History   Socioeconomic History   Marital status: Single    Spouse name: Not on  file   Number of children: Not on file   Years of education: Not on file   Highest education level: Not on file  Occupational History   Not on file  Tobacco Use   Smoking status: Never    Passive exposure: Never   Smokeless tobacco: Never  Vaping Use   Vaping status: Never Used  Substance and Sexual Activity   Alcohol use: Never   Drug use: Never   Sexual activity: Not Currently  Other Topics Concern   Not on file  Social History Narrative   Not on file   Social Drivers of Health   Financial Resource Strain: Low Risk  (02/09/2022)   Overall Financial Resource Strain (CARDIA)    Difficulty of Paying Living Expenses: Not hard at all  Food Insecurity: Low Risk  (10/13/2023)   Received from [facility]   Hunger Vital Sign    Within the past 12 months, you worried that your food would run out before you got money to buy more: Never true    Within the past 12 months, the food you bought just didn't last and you didn't have money to get more. : Never true  Transportation Needs: No Transportation Needs (10/13/2023)   Received from [facility]    In the past 12 months, has lack of reliable transportation kept you from medical appointments, meetings, work or from getting things needed for daily living? : No  Physical  Activity: Sufficiently Active (02/09/2022)   Exercise Vital Sign    Days of Exercise per Week: 7 days    Minutes of Exercise per Session: 60 min  Stress: Not on file  Social Connections: Unknown (06/07/2022)   Received from [facility]   Social Network    Social Network: Not on file   No family history on file. No Known Allergies Current Outpatient Medications  Medication Sig Dispense Refill   acetaminophen  (TYLENOL ) 500 MG tablet Take 2 tablets (1,000 mg total) by mouth every 6 (six) hours. 30 tablet 0   albuterol  (VENTOLIN  HFA) 108 (90 Base) MCG/ACT inhaler INHALE 2 PUFFS INTO LUNGS EVERY 6 HOURS AS NEEDED FOR WHEEZING OR SHORTNESS OF BREATH 8.5 g  2   amLODipine  (NORVASC ) 2.5 MG tablet Take 1 tablet (2.5 mg total) by mouth daily. 30 tablet 2   capecitabine  (XELODA ) 500 MG tablet Take 2 tablets (1,000 mg total) by mouth 2 (two) times daily with a meal. Take for 7 days, then off for 7 days. Repeat every 14 days. 56 tablet 1   famotidine (PEPCID) 20 MG tablet Take 20 mg by mouth 2 (two) times daily. As needed     ibuprofen (ADVIL) 600 MG tablet Take 600 mg by mouth every 6 (six) hours as needed. (Patient not taking: Reported on 10/25/2023)     lidocaine -prilocaine  (EMLA ) cream Apply 1 Application topically as needed. 30 g 2   loratadine  (CLARITIN ) 10 MG tablet Take 10 mg by mouth daily.     Omeprazole-Sodium Bicarbonate (ZEGERID OTC PO) Take 1 tablet by mouth 2 (two) times daily. (Patient not taking: Reported on 10/25/2023)     ondansetron  (ZOFRAN ) 8 MG tablet Take 1 tablet (8 mg total) by mouth every 8 (eight) hours as needed for nausea or vomiting. (Patient not taking: Reported on 10/25/2023) 30 tablet 1   oxyCODONE  (OXY IR/ROXICODONE ) 5 MG immediate release tablet Take 1 tablet (5 mg total) by mouth daily as needed for moderate pain (pain score 4-6). 15 tablet 0   Polyethylene Glycol 3350  (MIRALAX  PO) Take 17 g by mouth daily.     prochlorperazine  (COMPAZINE ) 10 MG tablet Take 1 tablet (10 mg total) by mouth every 6 (six) hours as needed. 60 tablet 1   No current facility-administered medications for this visit.   Facility-Administered Medications Ordered in Other Visits  Medication Dose Route Frequency Provider Last Rate Last Admin   sodium chloride  flush (NS) 0.9 % injection 10 mL  10 mL Intravenous PRN Thomas, Lisa K, NP   10 mL at 08/18/21 1020   sodium chloride  flush (NS) 0.9 % injection 10 mL  10 mL Intravenous PRN Sherrill, Gary B, MD   10 mL at 07/20/22 0922   sodium chloride  flush (NS) 0.9 % injection 10 mL  10 mL Intravenous PRN Sherrill, Gary B, MD   10 mL at 08/03/22 0944   sodium chloride  flush (NS) 0.9 % injection 10 mL  10 mL  Intravenous PRN Thomas, Lisa K, NP   10 mL at 07/12/23 0859   No results found.  Review of Systems:   A ROS was performed including pertinent positives and negatives as documented in the HPI.  Physical Exam :   Constitutional: NAD and appears stated age Neurological: Alert and oriented Psych: Appropriate affect and cooperative There were no vitals taken for this visit.   Comprehensive Musculoskeletal Exam:    Exam of the left shoulder demonstrates tenderness over the anterior glenohumeral joint and bicipital groove.  Active range of motion to 90 degrees forward elevation, 30 degrees external rotation, and internal rotation to back pocket.  AC joint nontender with negative cross body adduction.  Pain is noted with active supination.  Positive empty can and speeds test.  Imaging:   Xray (left shoulder 3 views): Mild glenohumeral and moderate acromioclavicular degenerative changes without acute fracture or dislocation.  There is some noted enlargement of the distal clavicle.  No suspicious lesions are identified.   I personally reviewed and interpreted the radiographs.      Assessment & Plan Chronic left shoulder pain Chronic left shoulder pain with limited range of motion and anterior pain suggests biceps tendinopathy and rotator cuff dysfunction. X-ray reveals degenerative changes within the glenohumeral and AC joints. Administer a steroid injection to the left shoulder to alleviate pain and improve function, ensuring no interference with chemotherapy. Encourage exercises to maintain shoulder mobility and strength. Monitor for adverse effects from the injection and follow-up as needed.      Procedure Note  Patient: Craig Best             Date of Birth: 10-08-1940           MRN: [id number]             Visit Date: 11/08/2023  Procedures: Visit Diagnoses:  1. Chronic left shoulder pain     Large Joint Inj: L glenohumeral on 11/08/2023 10:30 AM Indications: pain Details:  25 G 1.5 in needle, anterior approach Medications: 4 mL lidocaine  1 %; 2 mL triamcinolone  acetonide 40 MG/ML Outcome: tolerated well, no immediate complications Procedure, treatment alternatives, risks and benefits explained, specific risks discussed. Consent was given by the patient. Immediately prior to procedure a time out was called to verify the correct patient, procedure, equipment, support staff and site/side marked as required. Patient was prepped and draped in the usual sterile fashion.       I personally saw and evaluated the patient, and participated in the management and treatment plan.  Leonce Reveal, PA-C Orthopedics

## 2023-11-09 ENCOUNTER — Other Ambulatory Visit: Payer: Self-pay

## 2023-11-15 ENCOUNTER — Ambulatory Visit: Admitting: Oncology

## 2023-11-15 ENCOUNTER — Other Ambulatory Visit

## 2023-11-15 ENCOUNTER — Ambulatory Visit

## 2023-11-17 ENCOUNTER — Other Ambulatory Visit: Payer: Self-pay

## 2023-11-17 NOTE — Progress Notes (Signed)
 Specialty Pharmacy Refill Coordination Note  Craig Best is a 83 y.o. male contacted today regarding refills of specialty medication(s) Capecitabine  (XELODA )   Patient requested Delivery   Delivery date: 11/21/23   Verified address: [address]   [place] [address]   Medication will be filled on 11/18/23.

## 2023-11-22 ENCOUNTER — Ambulatory Visit: Payer: Self-pay | Admitting: Oncology

## 2023-11-22 ENCOUNTER — Inpatient Hospital Stay: Admitting: Oncology

## 2023-11-22 ENCOUNTER — Inpatient Hospital Stay

## 2023-11-22 ENCOUNTER — Ambulatory Visit (HOSPITAL_BASED_OUTPATIENT_CLINIC_OR_DEPARTMENT_OTHER)
Admission: RE | Admit: 2023-11-22 | Discharge: 2023-11-22 | Disposition: A | Discharge status: Home / Self Care | Source: Ambulatory Visit | Attending: Oncology | Admitting: Oncology

## 2023-11-22 ENCOUNTER — Inpatient Hospital Stay: Attending: Oncology

## 2023-11-22 VITALS — BP 132/80 | HR 75 | Temp 97.8°F | Resp 18 | Ht 68.0 in | Wt 190.4 lb

## 2023-11-22 DIAGNOSIS — C187 Malignant neoplasm of sigmoid colon: Secondary | ICD-10-CM

## 2023-11-22 DIAGNOSIS — C7801 Secondary malignant neoplasm of right lung: Secondary | ICD-10-CM | POA: Insufficient documentation

## 2023-11-22 DIAGNOSIS — C7802 Secondary malignant neoplasm of left lung: Secondary | ICD-10-CM | POA: Diagnosis not present

## 2023-11-22 DIAGNOSIS — Z5112 Encounter for antineoplastic immunotherapy: Secondary | ICD-10-CM | POA: Insufficient documentation

## 2023-11-22 LAB — CBC WITH DIFFERENTIAL (CANCER CENTER ONLY)
Abs Immature Granulocytes: 0.03 K/uL (ref 0.00–0.07)
Basophils Absolute: 0.2 K/uL — ABNORMAL HIGH (ref 0.0–0.1)
Basophils Relative: 2 %
Eosinophils Absolute: 0.1 K/uL (ref 0.0–0.5)
Eosinophils Relative: 1 %
HCT: 50.1 % (ref 39.0–52.0)
Hemoglobin: 17.4 g/dL — ABNORMAL HIGH (ref 13.0–17.0)
Immature Granulocytes: 0 %
Lymphocytes Relative: 20 %
Lymphs Abs: 1.9 K/uL (ref 0.7–4.0)
MCH: 32.3 pg (ref 26.0–34.0)
MCHC: 34.7 g/dL (ref 30.0–36.0)
MCV: 92.9 fL (ref 80.0–100.0)
Monocytes Absolute: 0.7 K/uL (ref 0.1–1.0)
Monocytes Relative: 7 %
Neutro Abs: 6.5 K/uL (ref 1.7–7.7)
Neutrophils Relative %: 70 %
Platelet Count: 301 K/uL (ref 150–400)
RBC: 5.39 MIL/uL (ref 4.22–5.81)
RDW: 13.8 % (ref 11.5–15.5)
WBC Count: 9.4 K/uL (ref 4.0–10.5)
nRBC: 0 % (ref 0.0–0.2)

## 2023-11-22 LAB — CMP (CANCER CENTER ONLY)
ALT: 17 U/L (ref 0–44)
AST: 22 U/L (ref 15–41)
Albumin: 4.4 g/dL (ref 3.5–5.0)
Alkaline Phosphatase: 91 U/L (ref 38–126)
Anion gap: 8 (ref 5–15)
BUN: 18 mg/dL (ref 8–23)
CO2: 31 mmol/L (ref 22–32)
Calcium: 10.3 mg/dL (ref 8.9–10.3)
Chloride: 97 mmol/L — ABNORMAL LOW (ref 98–111)
Creatinine: 0.97 mg/dL (ref 0.61–1.24)
GFR, Estimated: >60 mL/min (ref >60)
Glucose, Bld: 112 mg/dL — ABNORMAL HIGH (ref 70–99)
Potassium: 5.7 mmol/L — ABNORMAL HIGH (ref 3.5–5.1)
Sodium: 136 mmol/L (ref 135–145)
Total Bilirubin: 0.6 mg/dL (ref 0.0–1.2)
Total Protein: 8.3 g/dL — ABNORMAL HIGH (ref 6.5–8.1)

## 2023-11-22 LAB — CEA (ACCESS): CEA (CHCC): 96.17 ng/mL — ABNORMAL HIGH (ref 0.00–5.00)

## 2023-11-22 NOTE — Progress Notes (Signed)
 Pt came for port flush, lab and de access of port. After access obtained, port flushed easily and blood return noted. When drawing discard blood sample prior to labs was only able to draw back 5ml despite multiple attempts at flushing and repositioning. Phlebotomist came to draw labs peripherally and port was de accessed.

## 2023-11-22 NOTE — Progress Notes (Signed)
 [facility] OFFICE PROGRESS NOTE   Diagnosis: Colon cancer  INTERVAL HISTORY:   Craig Best returns as scheduled.  He reports increased belching for several months.  This is increased in intensity for the past several weeks.  He feels bad after eating.  He describes a bloating and increased belching.  He reports 1 episode of emesis.  He is having bowel movements, but the stool output has diminished from the colostomy.  He saw Dr. Marvis and underwent an upper endoscopy 11/10/2023.  Mild erythematous mucosa was noted in the stomach.  A biopsy did not reveal gastritis or H. pylori.  The duodenum and esophagus appeared normal.  He started Pepcid as needed.  Mr. Alleva otherwise feels well.  He continues working.    Objective:  Vital signs in last 24 hours:  Blood pressure 132/80, pulse 75, temperature 97.8 F (36.6 C), temperature source Temporal, resp. rate 18, height 5' 8 (1.727 m), weight 190 lb 6.4 oz (86.4 kg), SpO2 98%.   Resp: Decreased breath sounds at the left compared to the right posterior chest, no respiratory distress Cardio: Regular rate and rhythm, split S2 versus a systolic click GI: Mildly distended, no mass, nontender, no hepatosplenomegaly Vascular: No leg edema  Portacath/PICC-without erythema  Lab Results:  Lab Results  Component Value Date   WBC 9.4 11/22/2023   HGB 17.4 (H) 11/22/2023   HCT 50.1 11/22/2023   MCV 92.9 11/22/2023   PLT 301 11/22/2023   NEUTROABS 6.5 11/22/2023    CMP  Lab Results  Component Value Date   NA 138 10/25/2023   K 3.9 10/25/2023   CL 101 10/25/2023   CO2 26 10/25/2023   GLUCOSE 120 (H) 10/25/2023   BUN 12 10/25/2023   CREATININE 0.81 10/25/2023   CALCIUM  9.4 10/25/2023   PROT 7.8 10/25/2023   ALBUMIN 4.1 10/25/2023   AST 21 10/25/2023   ALT 12 10/25/2023   ALKPHOS 86 10/25/2023   BILITOT 0.4 10/25/2023   GFRNONAA >60 10/25/2023    Lab Results  Component Value Date   CEA1 63.2 (H) 09/19/2020    CEA 76.64 (H) 10/25/2023    No results found for: INR, LABPROT  Imaging:  No results found.  Medications: I have reviewed the patient's current medications.   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), status post a sigmoid colectomy and partial bladder resection 09/19/2020 2 separate sigmoid colon tumors, 2.5 cm apart, 1/13 lymph nodes positive, tumor extends to adherent soft tissue of the bladder, MSS, normal mismatch repair protein expression tumor mutation burden 0, K-ras G12D CT abdomen/pelvis 09/19/2020-sigmoid colon mass with colonic obstruction, 6 mm right lower lobe nodule CT chest 09/22/2020-multiple bilateral lung nodules consistent with metastases Elevated preoperative CEA Cycle 1 FOLFOX 10/28/2020 Cycle 2 FOLFOX 11/11/2020 Cycle 3 FOLFOX 11/25/2020 Cycle 4 FOLFOX 12/09/2020 Cycle 5 FOLFOX 12/23/2020 CT chest 12/25/2020-unchanged lung nodules, 2 cm subpleural nodule in the left posterior chest more prominent-loculated fluid? Cycle 6 FOLFOX 02/03/2021-5-FU dose reduced and bolus eliminated secondary to diarrhea Cycle 7 FOLFOX 02/17/2021 Cycle 8 FOLFOX 03/03/2021 PET 03/17/2021 pulmonary nodules without significant change, 10 mm nodule in right middle lobe with mild FDG uptake, mildly hypermetabolic pleural nodule in the lower left hemothorax, new clustered nodularity in the apical and posterior right upper lobe-likely infectious CTs 07/23/2021-new left pleural effusion with multiple pleural nodules, increased size of several pulmonary nodules, stable left paracolic gutter nodule, morphologic features suggestive of early cirrhosis Thoracentesis 07/24/2021-1200 cc of fluid removed, rare atypical cells present  Thoracentesis 627 2023-1.5 L of fluid removed, atypical cells present, acute inflammation and blood Thoracentesis 08/14/2021- 1.2 L of fluid removed, adenocarcinoma compatible with metastatic colon cancer Thoracentesis 08/21/2021-adenocarcinoma Cycle 1  FOLFIRI/bevacizumab  09/01/2021 Cycle 2 FOLFIRI/bevacizumab  09/15/2021 Cycle 3 FOLFIRI/bevacizumab  09/29/2021-Decadron  prophylaxis added for delayed nausea Cycle 4 FOLFIRI/bevacizumab  10/13/2021 Cycle 5 FOLFIRI/bevacizumab  10/27/2021 CT chest 10/28/2021-improvement in left pleural effusion, rule/parenchymal metastases-some are slightly smaller, no new lesions Cycle 6 FOLFIRI/bevacizumab  11/10/2021 Cycle 7 FOLFIRI/bevacizumab  12/01/2021 Cycle 8 FOLFIRI/bevacizumab  12/22/2021 CT chest 01/07/2022-stable pulmonary parenchymal and pleural metastatic disease. Cycle 9 FOLFIRI/bevacizumab  02/09/2022 03/02/2022 treatment discontinued due to poor tolerance Xeloda  7 days on/7 days off beginning 03/08/2022, Avastin  continued every 3 weeks CT chest 03/09/2022-no significant change in appearance of bilateral pulmonary metastasis, trace left pleural effusion, nodular liver contour Xeloda  7 days on/7 days off and Avastin  every 3 weeks CT chest 06/01/2022-mild enlargement of several pulmonary metastases, trace left pleural effusion Xeloda  7 days on/7 days off and Avastin  every 3 weeks 06/17/2022 Xeloda  resume 7 days on/7 days off 08/03/2022, declines Avastin  CTs 08/19/2022-pleural lung nodules appear to be slightly diminished in size, pulmonary parenchymal nodules appear to be slightly enlarged, overall little change.  No evidence of lymphadenopathy or metastatic disease in the abdomen or pelvis. Xeloda  continued 7 days on/7 days off 08/24/2022 Xeloda  dose reduced to 2 tablets twice daily 7 days on/7 days off beginning 10/21/2022 due to mucositis CTs 11/11/2022-pulmonary nodules have enlarged slightly from 08/19/2022.  Pleural nodularity thickening and fluid in the left lower hemithorax is similar with slight mass effect on the left hemidiaphragm. Xeloda  continued 7 days on/7 days off 11/18/2022 CTs 02/03/2023: No change in pulmonary nodules, nodular densities overlying the bladder dome have enlarged Avastin  resumed 02/15/2023 CTs  05/06/2023-no significant interval change Xeloda  7 days on/7 days off plus Avastin  every 3 weeks continued Avastin  held per patient request 06/21/2023 Avastin  held per patient request 07/12/2023 CTs 07/26/2023: Increase in size of multiple pulmonary nodules/masses, increased soft tissue nodularity at the bladder dome (measured multiple lung lesions in the bladder dome mass, some of the lesions measure slightly larger and others are stable Avastin  08/02/2023 Xeloda  7 days on/7 days off plus Avastin  every 3 weeks 10/04/2023 CTs 10/20/2023-stable bilateral pulmonary nodules and masses.  No new pulmonary nodules.  No mediastinal adenopathy.  Stable nodular thickening along the dome of the bladder.  Stable nodular peritoneal thickening left paracolic gutter.  No new or progressive metastatic disease in the abdomen/pelvis.  2.  Port-A-Cath placement-Dr. 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  Vertigo April 2024 5.  MRI brain 07/20/2022-no acute or reversible finding.  No evidence of metastatic disease.  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 6.   Belching/bloating: Upper endoscopy 11/10/2023: Normal esophagus and duodenum, mild erythematous mucosa in the stomach, biopsy-negative for gastritis or H. pylori     Disposition: Mr Mckim has metastatic colon cancer.  He has metastatic disease involving the lungs/pleura, bladder dome, and peritoneum.  He complains of frequent belching, postprandial bloating, and decreased stool output.  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  Mr. Ploeger will transition to a liquid diet for the next several days.  He will use MiraLAX .  He will be referred for a plain x-ray of the abdomen today.  He will return for an office visit next week.  We will  consider a repeat abdomen/pelvis CT depending on the plain x-ray findings. Xeloda  will be placed on hold. Arley Hof, MD  11/22/2023  11:51 AM

## 2023-11-22 NOTE — Patient Instructions (Signed)

## 2023-11-23 ENCOUNTER — Encounter: Payer: Self-pay | Admitting: Oncology

## 2023-11-23 ENCOUNTER — Ambulatory Visit: Payer: Self-pay | Admitting: Oncology

## 2023-11-23 NOTE — Telephone Encounter (Signed)
 Left VM for Mr. Sanderson that his K+ is high and Dr. Cloretta feels he needs to recheck this today. Requested he call to schedule the time. Also informed him that abd film has not been read yet-RN has requested a read today. Also sent MyChart message

## 2023-11-23 NOTE — Progress Notes (Signed)
 He is out of town. Will have lab on 10/16

## 2023-11-24 ENCOUNTER — Telehealth: Payer: Self-pay | Admitting: *Deleted

## 2023-11-24 ENCOUNTER — Inpatient Hospital Stay

## 2023-11-24 DIAGNOSIS — C187 Malignant neoplasm of sigmoid colon: Secondary | ICD-10-CM

## 2023-11-24 DIAGNOSIS — E875 Hyperkalemia: Secondary | ICD-10-CM

## 2023-11-24 DIAGNOSIS — Z5112 Encounter for antineoplastic immunotherapy: Secondary | ICD-10-CM | POA: Diagnosis not present

## 2023-11-24 LAB — BASIC METABOLIC PANEL - CANCER CENTER ONLY
Anion gap: 7 (ref 5–15)
BUN: 12 mg/dL (ref 8–23)
CO2: 32 mmol/L (ref 22–32)
Calcium: 10.2 mg/dL (ref 8.9–10.3)
Chloride: 96 mmol/L — ABNORMAL LOW (ref 98–111)
Creatinine: 0.88 mg/dL (ref 0.61–1.24)
GFR, Estimated: >60 mL/min (ref >60)
Glucose, Bld: 100 mg/dL — ABNORMAL HIGH (ref 70–99)
Potassium: 5.4 mmol/L — ABNORMAL HIGH (ref 3.5–5.1)
Sodium: 135 mmol/L (ref 135–145)

## 2023-11-24 NOTE — Telephone Encounter (Signed)
 Spoke with [name] while he was waiting for lab draw and let them know that abd xray was negative for obstruction. Provided him a list of foods he can eat on a low residue diet to hopefully decrease the amount of gas discomfort he is having. He asks nurse why Dr. Cloretta won't order a PET scan on him. Encouraged him to ask Dr. Cloretta at his visit next week.

## 2023-11-24 NOTE — Telephone Encounter (Signed)
 BMP reviewed by Olam, NP today. K+ is improved. Patient informed phlebotomist that he has been eating 5-7 bananas/day because it is one of the only things that does not upset his GI tract. Left message to stop the banana consumption for now and we can regroup on 10/21 visit. We will recheck his lab that day. High priority scheduling message sent.

## 2023-11-25 ENCOUNTER — Ambulatory Visit: Payer: Self-pay | Admitting: Oncology

## 2023-11-25 ENCOUNTER — Inpatient Hospital Stay: Admitting: Nurse Practitioner

## 2023-11-25 ENCOUNTER — Inpatient Hospital Stay

## 2023-11-25 NOTE — Telephone Encounter (Signed)
 I spoke with the patient, who indicated that he does not currently have a primary care physician. I also informed him that his potassium levels remain mildly elevated.

## 2023-11-25 NOTE — Telephone Encounter (Signed)
-----   Message from Arley Hof sent at 11/25/2023  9:30 AM EDT ----- Please call patient, the potassium is still mildly high, monitory for now , copy to primary MD, f/u as scheduled, bmp next visit  ----- Message ----- From: Rebecka, [facility] [place] Sent: 11/24/2023   1:54 PM EDT To: Arley KATHEE Hof, MD

## 2023-11-29 ENCOUNTER — Inpatient Hospital Stay

## 2023-11-29 ENCOUNTER — Encounter: Payer: Self-pay | Admitting: Nurse Practitioner

## 2023-11-29 ENCOUNTER — Inpatient Hospital Stay: Admitting: Nurse Practitioner

## 2023-11-29 VITALS — BP 132/73 | HR 78 | Temp 98.1°F | Resp 18 | Ht 68.0 in | Wt 189.7 lb

## 2023-11-29 DIAGNOSIS — C187 Malignant neoplasm of sigmoid colon: Secondary | ICD-10-CM

## 2023-11-29 DIAGNOSIS — E875 Hyperkalemia: Secondary | ICD-10-CM

## 2023-11-29 DIAGNOSIS — Z5112 Encounter for antineoplastic immunotherapy: Secondary | ICD-10-CM | POA: Diagnosis not present

## 2023-11-29 LAB — BASIC METABOLIC PANEL - CANCER CENTER ONLY
Anion gap: 10 (ref 5–15)
BUN: 15 mg/dL (ref 8–23)
CO2: 29 mmol/L (ref 22–32)
Calcium: 9.5 mg/dL (ref 8.9–10.3)
Chloride: 96 mmol/L — ABNORMAL LOW (ref 98–111)
Creatinine: 0.88 mg/dL (ref 0.61–1.24)
GFR, Estimated: >60 mL/min (ref >60)
Glucose, Bld: 107 mg/dL — ABNORMAL HIGH (ref 70–99)
Potassium: 4 mmol/L (ref 3.5–5.1)
Sodium: 135 mmol/L (ref 135–145)

## 2023-11-29 NOTE — Progress Notes (Signed)
 [facility] OFFICE PROGRESS NOTE   Diagnosis: Colon cancer  INTERVAL HISTORY:   [name] returns as scheduled.  He is feeling better.  Belching has improved.  Colostomy is functioning but wife feels output still remains less than typical.  He denies nausea/vomiting.  He is eating a regular diet.  No shortness of breath, cough, fever.  Objective:  Vital signs in last 24 hours:  Blood pressure 132/73, pulse 78, temperature 98.1 F (36.7 C), temperature source Temporal, resp. rate 18, height 5' 8 (1.727 m), weight 189 lb 11.2 oz (86 kg), SpO2 98%.    HEENT: No thrush or ulcers. Resp: Lungs clear bilaterally. Cardio: Regular rate and rhythm. GI: Abdomen soft and nontender.  No hepatosplenomegaly.  Bowel sounds active. Vascular: No leg edema. Skin: Palms without erythema. Port-A-Cath without erythema.  Lab Results:  Lab Results  Component Value Date   WBC 9.4 11/22/2023   HGB 17.4 (H) 11/22/2023   HCT 50.1 11/22/2023   MCV 92.9 11/22/2023   PLT 301 11/22/2023   NEUTROABS 6.5 11/22/2023    Imaging:  No results found.  Medications: I have reviewed the patient's current medications.  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), status post a sigmoid colectomy and partial bladder resection 09/19/2020 2 separate sigmoid colon tumors, 2.5 cm apart, 1/13 lymph nodes positive, tumor extends to adherent soft tissue of the bladder, MSS, normal mismatch repair protein expression tumor mutation burden 0, K-ras G12D CT abdomen/pelvis 09/19/2020-sigmoid colon mass with colonic obstruction, 6 mm right lower lobe nodule CT chest 09/22/2020-multiple bilateral lung nodules consistent with metastases Elevated preoperative CEA Cycle 1 FOLFOX 10/28/2020 Cycle 2 FOLFOX 11/11/2020 Cycle 3 FOLFOX 11/25/2020 Cycle 4 FOLFOX 12/09/2020 Cycle 5 FOLFOX 12/23/2020 CT chest 12/25/2020-unchanged lung nodules, 2 cm subpleural nodule in the left posterior chest more  prominent-loculated fluid? Cycle 6 FOLFOX 02/03/2021-5-FU dose reduced and bolus eliminated secondary to diarrhea Cycle 7 FOLFOX 02/17/2021 Cycle 8 FOLFOX 03/03/2021 PET 03/17/2021 pulmonary nodules without significant change, 10 mm nodule in right middle lobe with mild FDG uptake, mildly hypermetabolic pleural nodule in the lower left hemothorax, new clustered nodularity in the apical and posterior right upper lobe-likely infectious CTs 07/23/2021-new left pleural effusion with multiple pleural nodules, increased size of several pulmonary nodules, stable left paracolic gutter nodule, morphologic features suggestive of early cirrhosis Thoracentesis 07/24/2021-1200 cc of fluid removed, rare atypical cells present Thoracentesis 627 2023-1.5 L of fluid removed, atypical cells present, acute inflammation and blood Thoracentesis 08/14/2021- 1.2 L of fluid removed, adenocarcinoma compatible with metastatic colon cancer Thoracentesis 08/21/2021-adenocarcinoma Cycle 1 FOLFIRI/bevacizumab  09/01/2021 Cycle 2 FOLFIRI/bevacizumab  09/15/2021 Cycle 3 FOLFIRI/bevacizumab  09/29/2021-Decadron  prophylaxis added for delayed nausea Cycle 4 FOLFIRI/bevacizumab  10/13/2021 Cycle 5 FOLFIRI/bevacizumab  10/27/2021 CT chest 10/28/2021-improvement in left pleural effusion, rule/parenchymal metastases-some are slightly smaller, no new lesions Cycle 6 FOLFIRI/bevacizumab  11/10/2021 Cycle 7 FOLFIRI/bevacizumab  12/01/2021 Cycle 8 FOLFIRI/bevacizumab  12/22/2021 CT chest 01/07/2022-stable pulmonary parenchymal and pleural metastatic disease. Cycle 9 FOLFIRI/bevacizumab  02/09/2022 03/02/2022 treatment discontinued due to poor tolerance Xeloda  7 days on/7 days off beginning 03/08/2022, Avastin  continued every 3 weeks CT chest 03/09/2022-no significant change in appearance of bilateral pulmonary metastasis, trace left pleural effusion, nodular liver contour Xeloda  7 days on/7 days off and Avastin  every 3 weeks CT chest 06/01/2022-mild enlargement of  several pulmonary metastases, trace left pleural effusion Xeloda  7 days on/7 days off and Avastin  every 3 weeks 06/17/2022 Xeloda  resume 7 days on/7 days off 08/03/2022, declines Avastin  CTs 08/19/2022-pleural lung nodules appear to be slightly diminished in size, pulmonary  parenchymal nodules appear to be slightly enlarged, overall little change.  No evidence of lymphadenopathy or metastatic disease in the abdomen or pelvis. Xeloda  continued 7 days on/7 days off 08/24/2022 Xeloda  dose reduced to 2 tablets twice daily 7 days on/7 days off beginning 10/21/2022 due to mucositis CTs 11/11/2022-pulmonary nodules have enlarged slightly from 08/19/2022.  Pleural nodularity thickening and fluid in the left lower hemithorax is similar with slight mass effect on the left hemidiaphragm. Xeloda  continued 7 days on/7 days off 11/18/2022 CTs 02/03/2023: No change in pulmonary nodules, nodular densities overlying the bladder dome have enlarged Avastin  resumed 02/15/2023 CTs 05/06/2023-no significant interval change Xeloda  7 days on/7 days off plus Avastin  every 3 weeks continued Avastin  held per patient request 06/21/2023 Avastin  held per patient request 07/12/2023 CTs 07/26/2023: Increase in size of multiple pulmonary nodules/masses, increased soft tissue nodularity at the bladder dome (measured multiple lung lesions in the bladder dome mass, some of the lesions measure slightly larger and others are stable Avastin  08/02/2023 Xeloda  7 days on/7 days off plus Avastin  every 3 weeks 10/04/2023 CTs 10/20/2023-stable bilateral pulmonary nodules and masses.  No new pulmonary nodules.  No mediastinal adenopathy.  Stable nodular thickening along the dome of the bladder.  Stable nodular peritoneal thickening left paracolic gutter.  No new or progressive metastatic disease in the abdomen/pelvis. Xeloda  7 days on/7 days off plus Avastin  every 3 weeks 11/29/2023  2.  Port-A-Cath placement-Dr. 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  Vertigo April 2024 5.  MRI brain 07/20/2022-no acute or reversible finding.  No evidence of metastatic disease.  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 6.   Belching/bloating: Upper endoscopy 11/10/2023: Normal esophagus and duodenum, mild erythematous mucosa in the stomach, biopsy-negative for gastritis or H. pylori  Disposition: [name].  The symptoms he was experiencing last week have improved.  He would like to resume treatment with Xeloda  plus Avastin .  He plans to resume Xeloda  today and will return in 1 week for Avastin .  We reviewed the basic metabolic panel.  Potassium is now normal.  He will return for Avastin  in 1 week.  We will see him in follow-up in 4 weeks.  He will contact the office in the interim with any problems.  Patient seen with Dr. Cloretta.    Olam Ned ANP/GNP-BC   11/29/2023  8:23 AM  This was a shared visit with Olam Ned.  Mr. Gater has experienced improvement in belching and abdominal distention compared to when I saw him last week.  An x-ray of the abdomen on 11/22/2023 revealed no evidence of obstruction.  The plan is to resume treatment with Xeloda /bevacizumab .  We will follow his clinical status and the CEA.  He will be scheduled for a restaging CT within the next 1-2 months.  Arvella Cloretta, MD

## 2023-11-29 NOTE — Patient Instructions (Signed)

## 2023-11-30 ENCOUNTER — Other Ambulatory Visit: Payer: Self-pay

## 2023-12-02 ENCOUNTER — Inpatient Hospital Stay

## 2023-12-04 ENCOUNTER — Other Ambulatory Visit: Payer: Self-pay

## 2023-12-08 ENCOUNTER — Other Ambulatory Visit: Payer: Self-pay

## 2023-12-09 ENCOUNTER — Inpatient Hospital Stay

## 2023-12-09 ENCOUNTER — Other Ambulatory Visit: Payer: Self-pay

## 2023-12-09 ENCOUNTER — Other Ambulatory Visit: Payer: Self-pay | Admitting: Oncology

## 2023-12-09 VITALS — BP 160/68 | HR 61 | Temp 98.1°F | Resp 18 | Wt 188.7 lb

## 2023-12-09 DIAGNOSIS — C187 Malignant neoplasm of sigmoid colon: Secondary | ICD-10-CM

## 2023-12-09 DIAGNOSIS — Z5112 Encounter for antineoplastic immunotherapy: Secondary | ICD-10-CM | POA: Diagnosis not present

## 2023-12-09 LAB — CBC WITH DIFFERENTIAL (CANCER CENTER ONLY)
Abs Immature Granulocytes: 0.02 K/uL (ref 0.00–0.07)
Basophils Absolute: 0.1 K/uL (ref 0.0–0.1)
Basophils Relative: 2 %
Eosinophils Absolute: 0.2 K/uL (ref 0.0–0.5)
Eosinophils Relative: 2 %
HCT: 47.9 % (ref 39.0–52.0)
Hemoglobin: 16.3 g/dL (ref 13.0–17.0)
Immature Granulocytes: 0 %
Lymphocytes Relative: 24 %
Lymphs Abs: 2 K/uL (ref 0.7–4.0)
MCH: 31.8 pg (ref 26.0–34.0)
MCHC: 34 g/dL (ref 30.0–36.0)
MCV: 93.6 fL (ref 80.0–100.0)
Monocytes Absolute: 0.6 K/uL (ref 0.1–1.0)
Monocytes Relative: 7 %
Neutro Abs: 5.3 K/uL (ref 1.7–7.7)
Neutrophils Relative %: 65 %
Platelet Count: 317 K/uL (ref 150–400)
RBC: 5.12 MIL/uL (ref 4.22–5.81)
RDW: 13.8 % (ref 11.5–15.5)
WBC Count: 8.2 K/uL (ref 4.0–10.5)
nRBC: 0 % (ref 0.0–0.2)

## 2023-12-09 LAB — CMP (CANCER CENTER ONLY)
ALT: 16 U/L (ref 0–44)
AST: 20 U/L (ref 15–41)
Albumin: 4.2 g/dL (ref 3.5–5.0)
Alkaline Phosphatase: 80 U/L (ref 38–126)
Anion gap: 9 (ref 5–15)
BUN: 18 mg/dL (ref 8–23)
CO2: 30 mmol/L (ref 22–32)
Calcium: 9.9 mg/dL (ref 8.9–10.3)
Chloride: 99 mmol/L (ref 98–111)
Creatinine: 0.82 mg/dL (ref 0.61–1.24)
GFR, Estimated: >60 mL/min (ref >60)
Glucose, Bld: 106 mg/dL — ABNORMAL HIGH (ref 70–99)
Potassium: 4.1 mmol/L (ref 3.5–5.1)
Sodium: 137 mmol/L (ref 135–145)
Total Bilirubin: 0.6 mg/dL (ref 0.0–1.2)
Total Protein: 7.9 g/dL (ref 6.5–8.1)

## 2023-12-09 LAB — TOTAL PROTEIN, URINE DIPSTICK: Protein, ur: NEGATIVE mg/dL

## 2023-12-09 LAB — CEA (ACCESS): CEA (CHCC): 105.46 ng/mL — ABNORMAL HIGH (ref 0.00–5.00)

## 2023-12-09 MED ORDER — SODIUM CHLORIDE 0.9 % IV SOLN: Freq: Once | INTRAVENOUS | Status: AC

## 2023-12-09 MED ORDER — SODIUM CHLORIDE 0.9 % IV SOLN
7.5000 mg/kg | Freq: Once | INTRAVENOUS | Status: AC
  Administered 2023-12-09: 700 mg via INTRAVENOUS
  Filled 2023-12-09: qty 16

## 2023-12-09 NOTE — Patient Instructions (Signed)
 [facility]  Discharge Instructions: Thank you for choosing [facility] to provide your oncology and hematology care.   If you have a lab appointment with the Cancer Center, please go directly to the Cancer Center and check in at the registration area.   Wear comfortable clothing and clothing appropriate for easy access to any Portacath or PICC line.   We strive to give you quality time with your provider. You may need to reschedule your appointment if you arrive late (15 or more minutes).  Arriving late affects you and other patients whose appointments are after yours.  Also, if you miss three or more appointments without notifying the office, you may be dismissed from the clinic at the provider's discretion.      For prescription refill requests, have your pharmacy contact our office and allow 72 hours for refills to be completed.    Today you received the following chemotherapy and/or immunotherapy agents Avastin      To help prevent nausea and vomiting after your treatment, we encourage you to take your nausea medication as directed.  BELOW ARE SYMPTOMS THAT SHOULD BE REPORTED IMMEDIATELY: *FEVER GREATER THAN 100.4 F (38 C) OR HIGHER *CHILLS OR SWEATING *NAUSEA AND VOMITING THAT IS NOT CONTROLLED WITH YOUR NAUSEA MEDICATION *UNUSUAL SHORTNESS OF BREATH *UNUSUAL BRUISING OR BLEEDING *URINARY PROBLEMS (pain or burning when urinating, or frequent urination) *BOWEL PROBLEMS (unusual diarrhea, constipation, pain near the anus) TENDERNESS IN MOUTH AND THROAT WITH OR WITHOUT PRESENCE OF ULCERS (sore throat, sores in mouth, or a toothache) UNUSUAL RASH, SWELLING OR PAIN  UNUSUAL VAGINAL DISCHARGE OR ITCHING   Items with * indicate a potential emergency and should be followed up as soon as possible or go to the Emergency Department if any problems should occur.  Please show the CHEMOTHERAPY ALERT CARD or IMMUNOTHERAPY  ALERT CARD at check-in to the Emergency Department and triage nurse.  Should you have questions after your visit or need to cancel or reschedule your appointment, please contact [facility]  Dept: [phone]  and follow the prompts.  Office hours are 8:00 a.m. to 4:30 p.m. Monday - Friday. Please note that voicemails left after 4:00 p.m. may not be returned until the following business day.  We are closed weekends and major holidays. You have access to a nurse at all times for urgent questions. Please call the main number to the clinic Dept: [phone] and follow the prompts.   For any non-urgent questions, you may also contact your provider using MyChart. We now offer e-Visits for anyone 44 and older to request care online for non-urgent symptoms. For details visit [email or web address].   Also download the MyChart app! Go to the app store, search "MyChart", open the app, select [place], and log in with your MyChart username and password.

## 2023-12-12 ENCOUNTER — Encounter: Payer: Self-pay | Admitting: Radiology

## 2023-12-13 ENCOUNTER — Other Ambulatory Visit (HOSPITAL_COMMUNITY): Payer: Self-pay

## 2023-12-15 ENCOUNTER — Other Ambulatory Visit: Payer: Self-pay

## 2023-12-15 ENCOUNTER — Other Ambulatory Visit (HOSPITAL_COMMUNITY): Payer: Self-pay

## 2023-12-20 ENCOUNTER — Ambulatory Visit: Admitting: Oncology

## 2023-12-20 ENCOUNTER — Other Ambulatory Visit

## 2023-12-20 ENCOUNTER — Other Ambulatory Visit: Payer: Self-pay

## 2023-12-20 ENCOUNTER — Other Ambulatory Visit (HOSPITAL_COMMUNITY): Payer: Self-pay

## 2023-12-20 NOTE — Progress Notes (Signed)
 Clinical Intervention Note  Clinical Intervention Notes: Patient reported holding medication for 1 week due to provider's orders. Per OV on 10/14, medication was held due to GI side effects and blood work. Patient resumed medication per instructions at 10/21 visit.   Clinical Intervention Outcomes: Improved therapy adherence; Prevention of an adverse drug event   [employer] Account [employer]anner

## 2023-12-20 NOTE — Progress Notes (Signed)
 Specialty Pharmacy Refill Coordination Note  Spoke with Best,Craig   Craig Best is a 83 y.o. male contacted today regarding refills of specialty medication(s) Capecitabine  (XELODA )  Doses on hand: Starts last 7 days on 11/12, then off for 7. Next cycle approx 11/25  Patient requested: Delivery   Delivery date: 12/30/23   Verified address: [address] [place] [address]  Medication will be filled on 12/29/23 .

## 2023-12-21 ENCOUNTER — Telehealth: Payer: Self-pay

## 2023-12-21 NOTE — Telephone Encounter (Signed)
 [facility] listed as PCP with [employer]. Called patient to see if he needed to establish care of if he had PCP elsewhere.

## 2023-12-21 NOTE — Telephone Encounter (Signed)
 Copied from CRM [id number]. Topic: General - Call Back - No Documentation >> Dec 21, 2023 12:59 PM Shereese L wrote: Reason for CRM: patient is returning office call and would like a call back  Per note in chart pt was called today due to being attributed to our office with [facility]. Patient was called to ask if he was still looking to est care per note in chart. Please return patient call and schedule patient if needed with a provider accepting new patients.

## 2023-12-23 IMAGING — US US THORACENTESIS ASP PLEURAL SPACE W/IMG GUIDE
1 series · 1 of 1 positions shown · non-contrast
Comparison: none

INDICATION: 80-year-old male with history of colon cancer and new pleural
effusion referred for thoracentesis

[Series 1: us thoracentesis asp pleural space w/img guide · 1 of 1 slices shown]
[im 1/1]
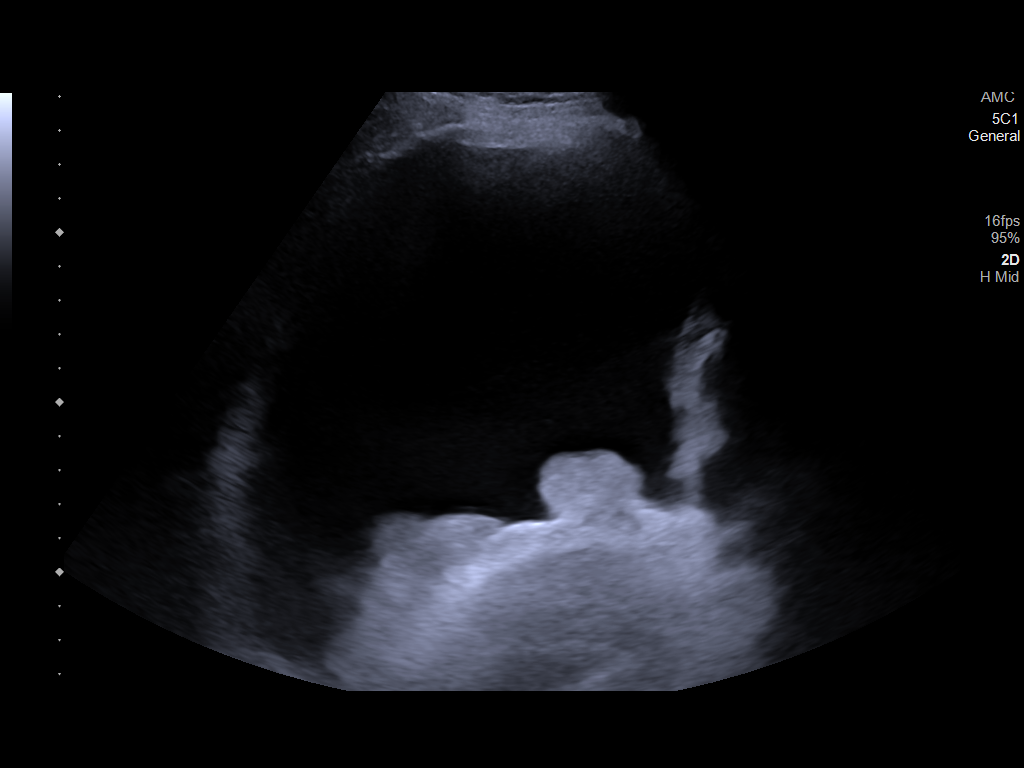

[1 of 1 positions shown; findings below may reference images not displayed]

EXAM:
ULTRASOUND GUIDED LEFT THORACENTESIS

MEDICATIONS:
None.

COMPLICATIONS:
None

PROCEDURE:
An ultrasound guided thoracentesis was thoroughly discussed with the
patient and questions answered. The benefits, risks, alternatives
and complications were also discussed. The patient understands and
wishes to proceed with the procedure. Written consent was obtained.

Ultrasound was performed to localize and mark an adequate pocket of
fluid in the left chest. The area was then prepped and draped in the
normal sterile fashion. 1% Lidocaine was used for local anesthesia.
Under ultrasound guidance a 8 Fr Safe-T-Centesis catheter was
introduced. Thoracentesis was performed. The catheter was removed
and a dressing applied.
FINDINGS: A total of approximately 3688 cc of thin amber fluid was removed.
Samples were sent to the laboratory as requested by the clinical
team.
IMPRESSION: Status post ultrasound-guided left thoracentesis

## 2023-12-25 ENCOUNTER — Other Ambulatory Visit: Payer: Self-pay | Admitting: Oncology

## 2023-12-27 ENCOUNTER — Inpatient Hospital Stay

## 2023-12-27 ENCOUNTER — Telehealth: Payer: Self-pay

## 2023-12-27 ENCOUNTER — Inpatient Hospital Stay: Admitting: Nurse Practitioner

## 2023-12-27 IMAGING — DX DG CHEST 2V
2 series · 2 of 2 positions shown · non-contrast
Comparison: 07/24/2021.

CLINICAL DATA: Follow-up left pleural effusion.

EXAM:
CHEST - 2 VIEW

[chest pa]
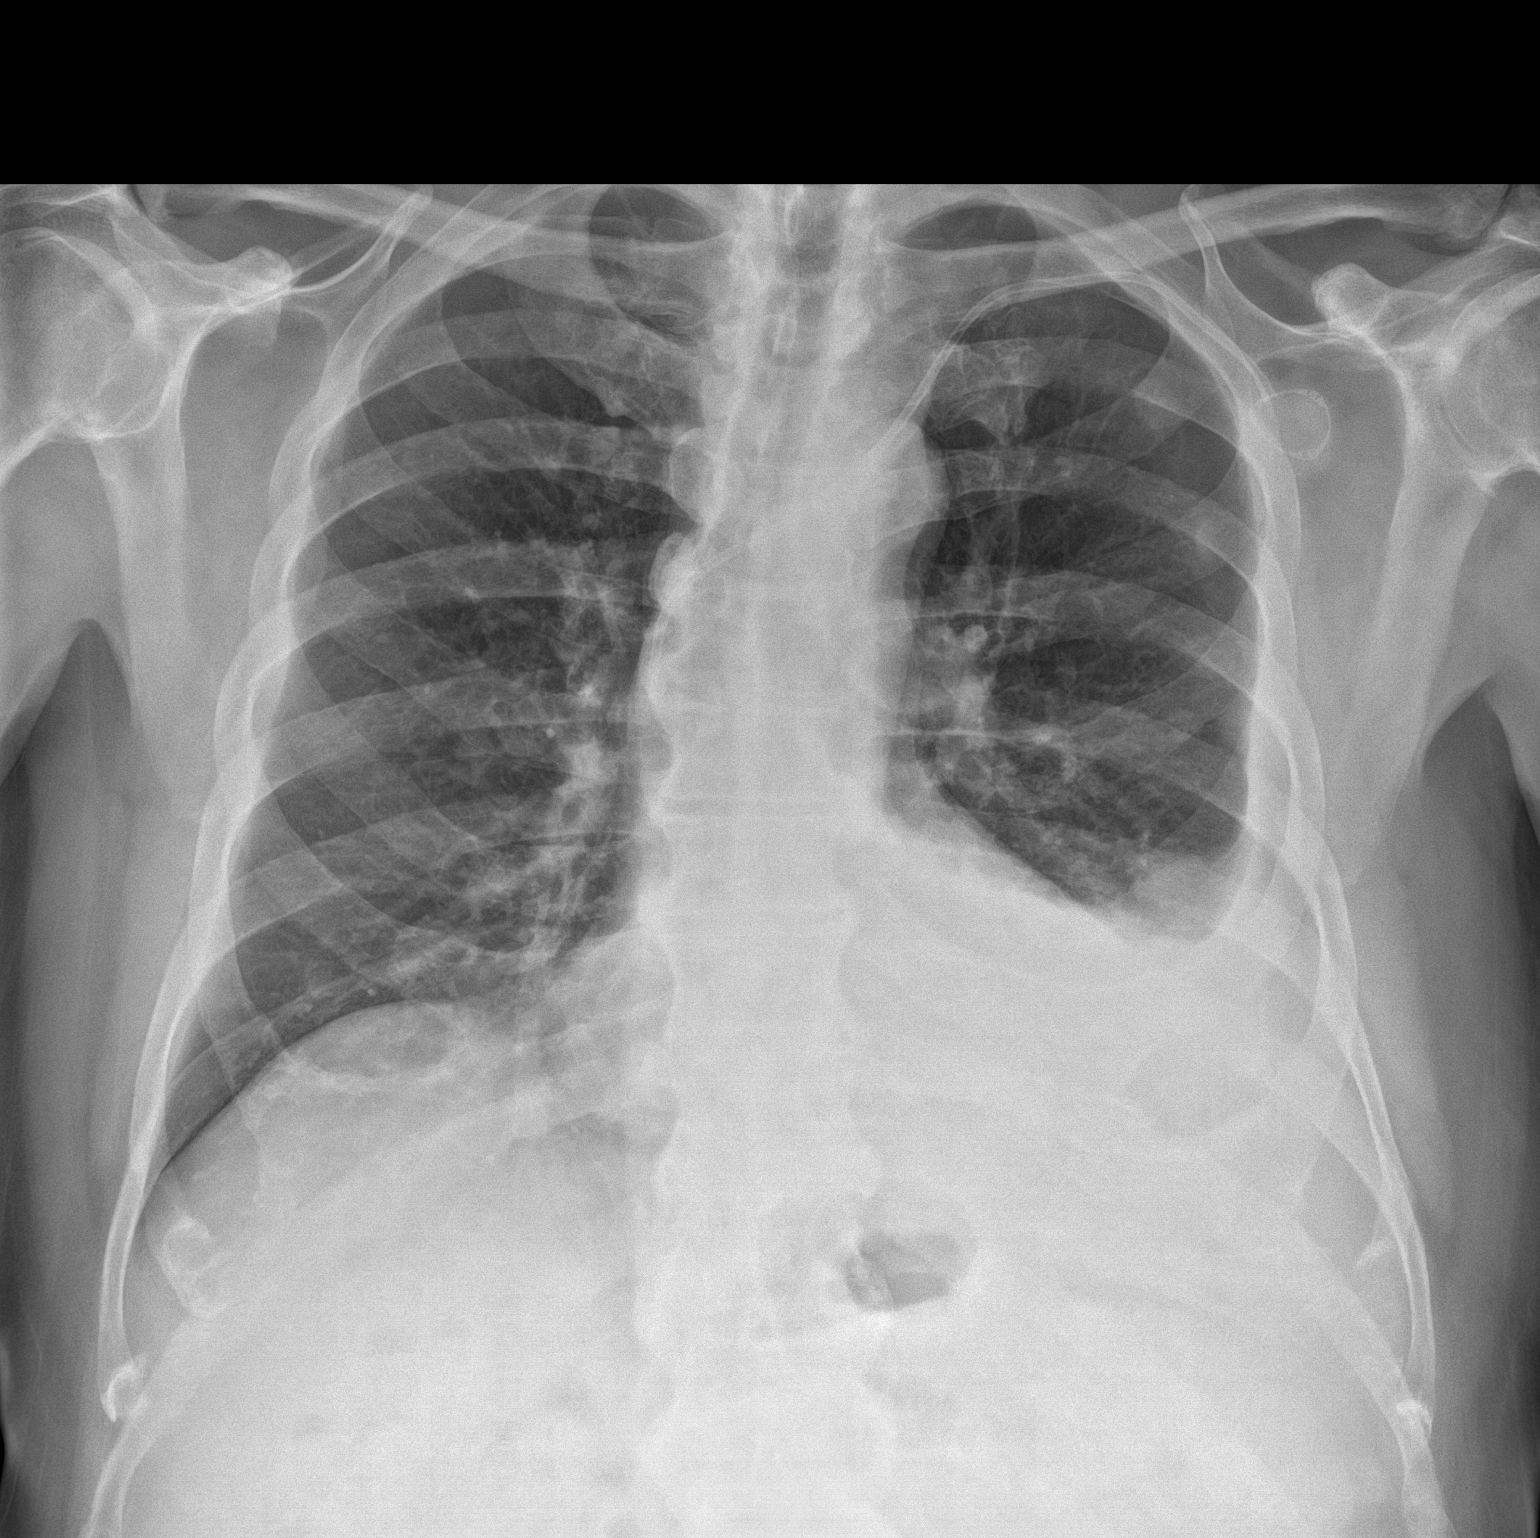

[chest lat]
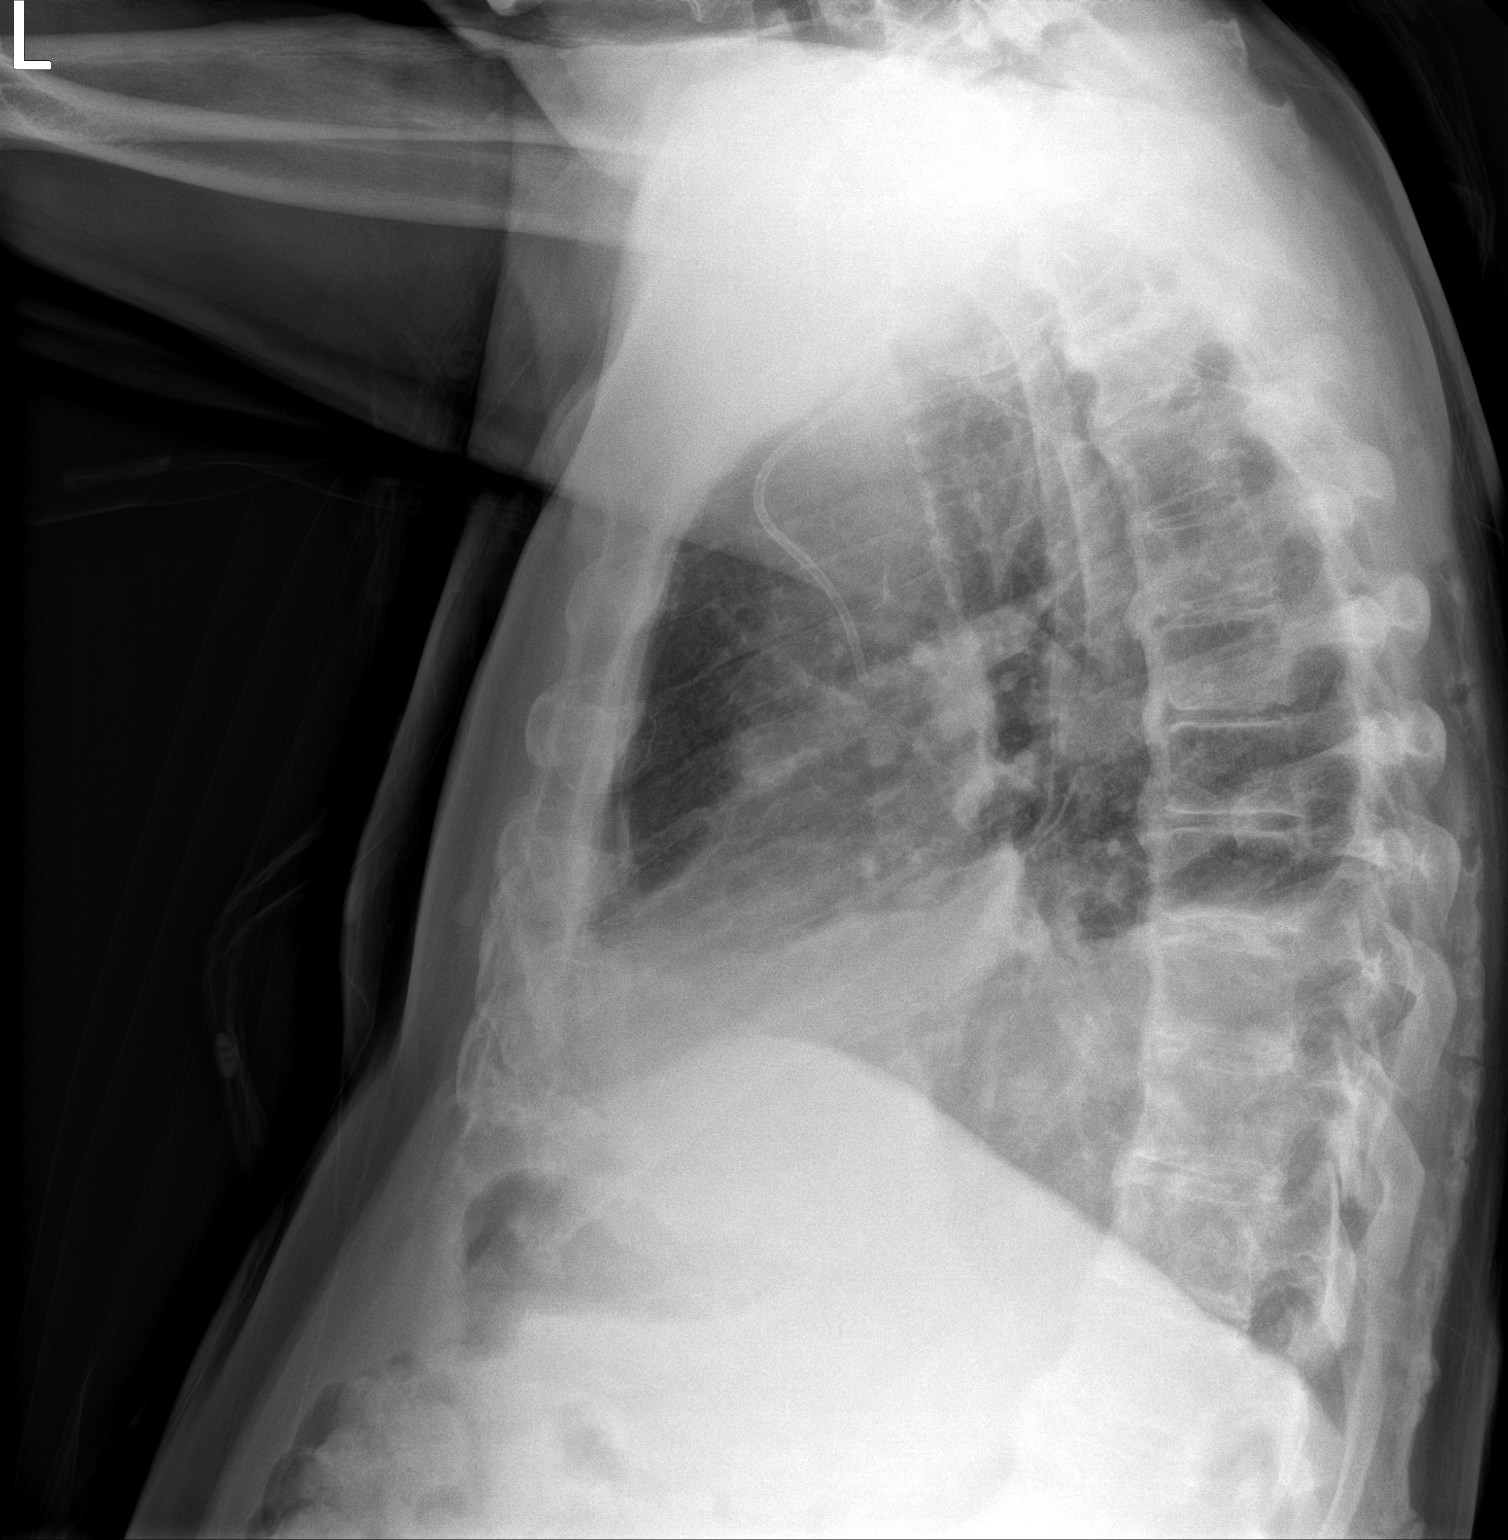

[2 of 2 positions shown; findings below may reference images not displayed]

FINDINGS: The heart size and mediastinal contours are stable. There is a small
to moderate left pleural effusion with compressive atelectasis at
the left lung base. No pneumothorax. A left chest port is stable in
position. Degenerative changes are noted in the thoracic spine. No
acute osseous abnormality.
IMPRESSION: Small to moderate left pleural effusion common decreased in size
from the prior exam.

## 2023-12-27 NOTE — Telephone Encounter (Signed)
 [facility] listed as PCP with [employer]. Called patient to see if he needed to establish care of if he had PCP elsewhere.     If he calls back please make appointment if needed.

## 2023-12-29 ENCOUNTER — Other Ambulatory Visit: Payer: Self-pay

## 2023-12-30 ENCOUNTER — Inpatient Hospital Stay

## 2023-12-30 ENCOUNTER — Inpatient Hospital Stay: Attending: Oncology | Admitting: Nurse Practitioner

## 2023-12-30 ENCOUNTER — Telehealth: Payer: Self-pay

## 2023-12-30 NOTE — Telephone Encounter (Signed)
 follow up with the patient regarding his missed appointment. The patient reported that he called the day prior to cancel due to being unwell, believing he has the flu. He mentioned that he is available to reschedule for either Tuesday or Thursday of next week.

## 2024-01-02 ENCOUNTER — Telehealth: Payer: Self-pay | Admitting: Oncology

## 2024-01-02 NOTE — Telephone Encounter (Signed)
 PT called isn't feeling well and wants to reschedule appts to next week. Left voicemail on PT's phone.

## 2024-01-03 ENCOUNTER — Inpatient Hospital Stay

## 2024-01-03 ENCOUNTER — Inpatient Hospital Stay: Admitting: Nurse Practitioner

## 2024-01-13 ENCOUNTER — Encounter: Payer: Self-pay | Admitting: Nurse Practitioner

## 2024-01-13 ENCOUNTER — Telehealth: Payer: Self-pay

## 2024-01-13 ENCOUNTER — Inpatient Hospital Stay: Admitting: Nurse Practitioner

## 2024-01-13 ENCOUNTER — Ambulatory Visit (HOSPITAL_BASED_OUTPATIENT_CLINIC_OR_DEPARTMENT_OTHER)
Admission: RE | Admit: 2024-01-13 | Discharge: 2024-01-13 | Disposition: A | Discharge status: Home / Self Care | Source: Ambulatory Visit | Attending: Nurse Practitioner | Admitting: Nurse Practitioner

## 2024-01-13 ENCOUNTER — Inpatient Hospital Stay

## 2024-01-13 ENCOUNTER — Inpatient Hospital Stay: Attending: Oncology

## 2024-01-13 VITALS — BP 134/82 | HR 93 | Temp 98.7°F | Resp 16 | Wt 188.2 lb

## 2024-01-13 VITALS — BP 149/80 | HR 82 | Temp 98.1°F | Resp 18

## 2024-01-13 DIAGNOSIS — C7801 Secondary malignant neoplasm of right lung: Secondary | ICD-10-CM | POA: Diagnosis present

## 2024-01-13 DIAGNOSIS — C187 Malignant neoplasm of sigmoid colon: Secondary | ICD-10-CM

## 2024-01-13 DIAGNOSIS — Z5112 Encounter for antineoplastic immunotherapy: Secondary | ICD-10-CM | POA: Insufficient documentation

## 2024-01-13 DIAGNOSIS — C7802 Secondary malignant neoplasm of left lung: Secondary | ICD-10-CM | POA: Diagnosis not present

## 2024-01-13 LAB — CBC WITH DIFFERENTIAL (CANCER CENTER ONLY)
Abs Immature Granulocytes: 0.03 K/uL (ref 0.00–0.07)
Basophils Absolute: 0.1 K/uL (ref 0.0–0.1)
Basophils Relative: 1 %
Eosinophils Absolute: 0.1 K/uL (ref 0.0–0.5)
Eosinophils Relative: 1 %
HCT: 45.6 % (ref 39.0–52.0)
Hemoglobin: 15.9 g/dL (ref 13.0–17.0)
Immature Granulocytes: 0 %
Lymphocytes Relative: 19 %
Lymphs Abs: 1.6 K/uL (ref 0.7–4.0)
MCH: 32.6 pg (ref 26.0–34.0)
MCHC: 34.9 g/dL (ref 30.0–36.0)
MCV: 93.6 fL (ref 80.0–100.0)
Monocytes Absolute: 0.6 K/uL (ref 0.1–1.0)
Monocytes Relative: 7 %
Neutro Abs: 6.1 K/uL (ref 1.7–7.7)
Neutrophils Relative %: 72 %
Platelet Count: 359 K/uL (ref 150–400)
RBC: 4.87 MIL/uL (ref 4.22–5.81)
RDW: 14.3 % (ref 11.5–15.5)
WBC Count: 8.6 K/uL (ref 4.0–10.5)
nRBC: 0 % (ref 0.0–0.2)

## 2024-01-13 LAB — CMP (CANCER CENTER ONLY)
ALT: 13 U/L (ref 0–44)
AST: 19 U/L (ref 15–41)
Albumin: 4.1 g/dL (ref 3.5–5.0)
Alkaline Phosphatase: 82 U/L (ref 38–126)
Anion gap: 9 (ref 5–15)
BUN: 14 mg/dL (ref 8–23)
CO2: 29 mmol/L (ref 22–32)
Calcium: 9.7 mg/dL (ref 8.9–10.3)
Chloride: 100 mmol/L (ref 98–111)
Creatinine: 0.81 mg/dL (ref 0.61–1.24)
GFR, Estimated: >60 mL/min (ref >60)
Glucose, Bld: 100 mg/dL — ABNORMAL HIGH (ref 70–99)
Potassium: 3.8 mmol/L (ref 3.5–5.1)
Sodium: 138 mmol/L (ref 135–145)
Total Bilirubin: 0.6 mg/dL (ref 0.0–1.2)
Total Protein: 7.9 g/dL (ref 6.5–8.1)

## 2024-01-13 LAB — CEA (ACCESS): CEA (CHCC): 98.31 ng/mL — ABNORMAL HIGH (ref 0.00–5.00)

## 2024-01-13 LAB — TOTAL PROTEIN, URINE DIPSTICK: Protein, ur: NEGATIVE mg/dL

## 2024-01-13 MED ORDER — SODIUM CHLORIDE 0.9 % IV SOLN
7.5000 mg/kg | Freq: Once | INTRAVENOUS | Status: AC
  Administered 2024-01-13: 700 mg via INTRAVENOUS
  Filled 2024-01-13: qty 16

## 2024-01-13 MED ORDER — OXYCODONE HCL 5 MG PO TABS: 5.0000 mg | ORAL_TABLET | Freq: Every day | ORAL | 0 refills | Status: DC | PRN

## 2024-01-13 MED ORDER — SODIUM CHLORIDE 0.9 % IV SOLN: Freq: Once | INTRAVENOUS | Status: AC

## 2024-01-13 NOTE — Telephone Encounter (Signed)
 Telephone call placed to the patient as requested by Olam, NP. Patient was informed that the chest X-ray did not show evidence of a pleural effusion. Patient verbalized understanding of the information provided.

## 2024-01-13 NOTE — Patient Instructions (Signed)
 [facility]  Discharge Instructions: Thank you for choosing [facility] to provide your oncology and hematology care.   If you have a lab appointment with the Cancer Center, please go directly to the Cancer Center and check in at the registration area.   Wear comfortable clothing and clothing appropriate for easy access to any Portacath or PICC line.   We strive to give you quality time with your provider. You may need to reschedule your appointment if you arrive late (15 or more minutes).  Arriving late affects you and other patients whose appointments are after yours.  Also, if you miss three or more appointments without notifying the office, you may be dismissed from the clinic at the provider's discretion.      For prescription refill requests, have your pharmacy contact our office and allow 72 hours for refills to be completed.    Today you received the following chemotherapy and/or immunotherapy agents Avastin      To help prevent nausea and vomiting after your treatment, we encourage you to take your nausea medication as directed.  BELOW ARE SYMPTOMS THAT SHOULD BE REPORTED IMMEDIATELY: *FEVER GREATER THAN 100.4 F (38 C) OR HIGHER *CHILLS OR SWEATING *NAUSEA AND VOMITING THAT IS NOT CONTROLLED WITH YOUR NAUSEA MEDICATION *UNUSUAL SHORTNESS OF BREATH *UNUSUAL BRUISING OR BLEEDING *URINARY PROBLEMS (pain or burning when urinating, or frequent urination) *BOWEL PROBLEMS (unusual diarrhea, constipation, pain near the anus) TENDERNESS IN MOUTH AND THROAT WITH OR WITHOUT PRESENCE OF ULCERS (sore throat, sores in mouth, or a toothache) UNUSUAL RASH, SWELLING OR PAIN  UNUSUAL VAGINAL DISCHARGE OR ITCHING   Items with * indicate a potential emergency and should be followed up as soon as possible or go to the Emergency Department if any problems should occur.  Please show the CHEMOTHERAPY ALERT CARD or IMMUNOTHERAPY  ALERT CARD at check-in to the Emergency Department and triage nurse.  Should you have questions after your visit or need to cancel or reschedule your appointment, please contact [facility]  Dept: [phone]  and follow the prompts.  Office hours are 8:00 a.m. to 4:30 p.m. Monday - Friday. Please note that voicemails left after 4:00 p.m. may not be returned until the following business day.  We are closed weekends and major holidays. You have access to a nurse at all times for urgent questions. Please call the main number to the clinic Dept: [phone] and follow the prompts.   For any non-urgent questions, you may also contact your provider using MyChart. We now offer e-Visits for anyone 44 and older to request care online for non-urgent symptoms. For details visit [email or web address].   Also download the MyChart app! Go to the app store, search "MyChart", open the app, select [place], and log in with your MyChart username and password.

## 2024-01-13 NOTE — Telephone Encounter (Signed)
-----   Message from Olam Ned sent at 01/13/2024  2:32 PM EST ----- Please let him know the chest x-ray did not show a pleural effusion.

## 2024-01-13 NOTE — Progress Notes (Signed)
 Craig Best OFFICE PROGRESS NOTE   Diagnosis: Colon cancer  INTERVAL HISTORY:   Craig Best returns for follow-up.  Currently on treatment with Xeloda  7 days on/7 days off and every 3-week bevacizumab .  He canceled subsequent follow-up appointments due to illness.  He has been off of Xeloda  for 2 weeks.  He plans to resume on 01/17/2024.    He thinks he may have had the flu a few weeks ago.  He had nasal congestion and a cough.  Symptoms are improving.  No fever.  No shortness of breath.  He denies bleeding.  No constipation or diarrhea.  He has an occasional loose stool.  No hand or foot pain or redness.  He continues to have intermittent belching.  He takes oxycodone  if needed for pain associated with the belching.  He would like a refill.  Objective:  Vital signs in last 24 hours:  Blood pressure 134/82, pulse 93, temperature 98.7 F (37.1 C), temperature source Temporal, resp. rate 16, weight 188 lb 3.2 oz (85.4 kg), SpO2 98%.    HEENT: No thrush or ulcers. Resp: Decreased breath sounds at the left compared to right posterior chest.  No respiratory distress. Cardio: Regular rate and rhythm. GI: No hepatosplenomegaly.  No mass.  Nontender. Vascular: No leg edema. Skin: Palms without erythema. Port-A-Cath without erythema.  Lab Results:  Lab Results  Component Value Date   WBC 8.2 12/09/2023   HGB 16.3 12/09/2023   HCT 47.9 12/09/2023   MCV 93.6 12/09/2023   PLT 317 12/09/2023   NEUTROABS 5.3 12/09/2023    Imaging:  No results found.  Medications: I have reviewed the patient's current medications.  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), status post a sigmoid colectomy and partial bladder resection 09/19/2020 2 separate sigmoid colon tumors, 2.5 cm apart, 1/13 lymph nodes positive, tumor extends to adherent soft tissue of the bladder, MSS, normal mismatch repair protein expression tumor mutation burden 0, K-ras G12D CT  abdomen/pelvis 09/19/2020-sigmoid colon mass with colonic obstruction, 6 mm right lower lobe nodule CT chest 09/22/2020-multiple bilateral lung nodules consistent with metastases Elevated preoperative CEA Cycle 1 FOLFOX 10/28/2020 Cycle 2 FOLFOX 11/11/2020 Cycle 3 FOLFOX 11/25/2020 Cycle 4 FOLFOX 12/09/2020 Cycle 5 FOLFOX 12/23/2020 CT chest 12/25/2020-unchanged lung nodules, 2 cm subpleural nodule in the left posterior chest more prominent-loculated fluid? Cycle 6 FOLFOX 02/03/2021-5-FU dose reduced and bolus eliminated secondary to diarrhea Cycle 7 FOLFOX 02/17/2021 Cycle 8 FOLFOX 03/03/2021 PET 03/17/2021 pulmonary nodules without significant change, 10 mm nodule in right middle lobe with mild FDG uptake, mildly hypermetabolic pleural nodule in the lower left hemothorax, new clustered nodularity in the apical and posterior right upper lobe-likely infectious CTs 07/23/2021-new left pleural effusion with multiple pleural nodules, increased size of several pulmonary nodules, stable left paracolic gutter nodule, morphologic features suggestive of early cirrhosis Thoracentesis 07/24/2021-1200 cc of fluid removed, rare atypical cells present Thoracentesis 627 2023-1.5 L of fluid removed, atypical cells present, acute inflammation and blood Thoracentesis 08/14/2021- 1.2 L of fluid removed, adenocarcinoma compatible with metastatic colon cancer Thoracentesis 08/21/2021-adenocarcinoma Cycle 1 FOLFIRI/bevacizumab  09/01/2021 Cycle 2 FOLFIRI/bevacizumab  09/15/2021 Cycle 3 FOLFIRI/bevacizumab  09/29/2021-Decadron  prophylaxis added for delayed nausea Cycle 4 FOLFIRI/bevacizumab  10/13/2021 Cycle 5 FOLFIRI/bevacizumab  10/27/2021 CT chest 10/28/2021-improvement in left pleural effusion, rule/parenchymal metastases-some are slightly smaller, no new lesions Cycle 6 FOLFIRI/bevacizumab  11/10/2021 Cycle 7 FOLFIRI/bevacizumab  12/01/2021 Cycle 8 FOLFIRI/bevacizumab  12/22/2021 CT chest 01/07/2022-stable pulmonary parenchymal and  pleural metastatic disease. Cycle 9 FOLFIRI/bevacizumab  02/09/2022 03/02/2022 treatment discontinued due to poor tolerance Xeloda   7 days on/7 days off beginning 03/08/2022, Avastin  continued every 3 weeks CT chest 03/09/2022-no significant change in appearance of bilateral pulmonary metastasis, trace left pleural effusion, nodular liver contour Xeloda  7 days on/7 days off and Avastin  every 3 weeks CT chest 06/01/2022-mild enlargement of several pulmonary metastases, trace left pleural effusion Xeloda  7 days on/7 days off and Avastin  every 3 weeks 06/17/2022 Xeloda  resume 7 days on/7 days off 08/03/2022, declines Avastin  CTs 08/19/2022-pleural lung nodules appear to be slightly diminished in size, pulmonary parenchymal nodules appear to be slightly enlarged, overall little change.  No evidence of lymphadenopathy or metastatic disease in the abdomen or pelvis. Xeloda  continued 7 days on/7 days off 08/24/2022 Xeloda  dose reduced to 2 tablets twice daily 7 days on/7 days off beginning 10/21/2022 due to mucositis CTs 11/11/2022-pulmonary nodules have enlarged slightly from 08/19/2022.  Pleural nodularity thickening and fluid in the left lower hemithorax is similar with slight mass effect on the left hemidiaphragm. Xeloda  continued 7 days on/7 days off 11/18/2022 CTs 02/03/2023: No change in pulmonary nodules, nodular densities overlying the bladder dome have enlarged Avastin  resumed 02/15/2023 CTs 05/06/2023-no significant interval change Xeloda  7 days on/7 days off plus Avastin  every 3 weeks continued Avastin  held per patient request 06/21/2023 Avastin  held per patient request 07/12/2023 CTs 07/26/2023: Increase in size of multiple pulmonary nodules/masses, increased soft tissue nodularity at the bladder dome (measured multiple lung lesions in the bladder dome mass, some of the lesions measure slightly larger and others are stable Avastin  08/02/2023 Xeloda  7 days on/7 days off plus Avastin  every 3 weeks 10/04/2023 CTs  10/20/2023-stable bilateral pulmonary nodules and masses.  No new pulmonary nodules.  No mediastinal adenopathy.  Stable nodular thickening along the dome of the bladder.  Stable nodular peritoneal thickening left paracolic gutter.  No new or progressive metastatic disease in the abdomen/pelvis. Xeloda  7 days on/7 days off plus Avastin  every 3 weeks 11/29/2023   2.  Port-A-Cath placement-Dr. 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  Vertigo April 2024 5.  MRI brain 07/20/2022-no acute or reversible finding.  No evidence of metastatic disease.  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 6.   Belching/bloating: Upper endoscopy 11/10/2023: Normal esophagus and duodenum, mild erythematous mucosa in the stomach, biopsy-negative for gastritis or H. pylori    Disposition: Mr. Kiener appears stable.  He is on active treatment with Xeloda  7 days on/7 days off and Avastin  every 3 weeks.  Treatment has been sporadic over the past month due to a respiratory illness.  He is feeling better and plans to resume Xeloda  on the current schedule of 7 days on/7 days off on 01/17/2024.  Plan to proceed with Avastin  today as scheduled.  CBC and chemistry panel reviewed.  Labs are adequate for treatment.  Urine is negative for protein.  CEA is stable.  Plan for chest x-ray today to evaluate for a recurrent left pleural effusion.  He will return for follow-up and Avastin  02/07/2024.  We are available to see him sooner if needed.    Craig Best ANP/GNP-BC   01/13/2024  10:31 AM

## 2024-01-19 ENCOUNTER — Other Ambulatory Visit (HOSPITAL_COMMUNITY): Payer: Self-pay

## 2024-01-19 ENCOUNTER — Other Ambulatory Visit: Payer: Self-pay | Admitting: Oncology

## 2024-01-19 ENCOUNTER — Other Ambulatory Visit: Payer: Self-pay

## 2024-01-19 DIAGNOSIS — C187 Malignant neoplasm of sigmoid colon: Secondary | ICD-10-CM

## 2024-01-19 MED ORDER — CAPECITABINE 500 MG PO TABS
1000.0000 mg | ORAL_TABLET | Freq: Two times a day (BID) | ORAL | 1 refills | Status: AC
  Filled 2024-01-19: qty 56, 28d supply, fill #0
  Filled 2024-02-21: qty 56, 28d supply, fill #1

## 2024-01-23 ENCOUNTER — Other Ambulatory Visit (HOSPITAL_COMMUNITY): Payer: Self-pay

## 2024-01-25 ENCOUNTER — Other Ambulatory Visit: Payer: Self-pay

## 2024-01-25 NOTE — Progress Notes (Signed)
 Specialty Pharmacy Refill Coordination Note  Craig Best is a 83 y.o. male contacted today regarding refills of specialty medication(s) Capecitabine  (XELODA )   Patient requested Delivery   Delivery date: 01/31/24   Verified address: [address] [place] [address]   Medication will be filled on: 01/30/24  Spoke with patient's wife

## 2024-02-05 ENCOUNTER — Other Ambulatory Visit: Payer: Self-pay | Admitting: Oncology

## 2024-02-07 ENCOUNTER — Inpatient Hospital Stay

## 2024-02-07 ENCOUNTER — Inpatient Hospital Stay (HOSPITAL_BASED_OUTPATIENT_CLINIC_OR_DEPARTMENT_OTHER): Admitting: Nurse Practitioner

## 2024-02-07 ENCOUNTER — Encounter: Payer: Self-pay | Admitting: Nurse Practitioner

## 2024-02-07 ENCOUNTER — Other Ambulatory Visit: Payer: Self-pay

## 2024-02-07 VITALS — BP 138/72 | HR 73 | Temp 98.2°F | Resp 18 | Ht 68.0 in | Wt 182.8 lb

## 2024-02-07 VITALS — BP 145/72 | HR 60 | Temp 98.0°F | Resp 13

## 2024-02-07 DIAGNOSIS — C187 Malignant neoplasm of sigmoid colon: Secondary | ICD-10-CM

## 2024-02-07 DIAGNOSIS — Z5112 Encounter for antineoplastic immunotherapy: Secondary | ICD-10-CM | POA: Diagnosis not present

## 2024-02-07 LAB — CBC WITH DIFFERENTIAL (CANCER CENTER ONLY)
Abs Immature Granulocytes: 0.01 K/uL (ref 0.00–0.07)
Basophils Absolute: 0.1 K/uL (ref 0.0–0.1)
Basophils Relative: 2 %
Eosinophils Absolute: 0.2 K/uL (ref 0.0–0.5)
Eosinophils Relative: 2 %
HCT: 45.6 % (ref 39.0–52.0)
Hemoglobin: 15.8 g/dL (ref 13.0–17.0)
Immature Granulocytes: 0 %
Lymphocytes Relative: 20 %
Lymphs Abs: 1.6 K/uL (ref 0.7–4.0)
MCH: 32.8 pg (ref 26.0–34.0)
MCHC: 34.6 g/dL (ref 30.0–36.0)
MCV: 94.8 fL (ref 80.0–100.0)
Monocytes Absolute: 0.7 K/uL (ref 0.1–1.0)
Monocytes Relative: 8 %
Neutro Abs: 5.3 K/uL (ref 1.7–7.7)
Neutrophils Relative %: 68 %
Platelet Count: 327 K/uL (ref 150–400)
RBC: 4.81 MIL/uL (ref 4.22–5.81)
RDW: 14.2 % (ref 11.5–15.5)
WBC Count: 7.8 K/uL (ref 4.0–10.5)
nRBC: 0 % (ref 0.0–0.2)

## 2024-02-07 LAB — CMP (CANCER CENTER ONLY)
ALT: 13 U/L (ref 0–44)
AST: 18 U/L (ref 15–41)
Albumin: 4.1 g/dL (ref 3.5–5.0)
Alkaline Phosphatase: 92 U/L (ref 38–126)
Anion gap: 10 (ref 5–15)
BUN: 16 mg/dL (ref 8–23)
CO2: 29 mmol/L (ref 22–32)
Calcium: 9.5 mg/dL (ref 8.9–10.3)
Chloride: 99 mmol/L (ref 98–111)
Creatinine: 0.7 mg/dL (ref 0.61–1.24)
GFR, Estimated: >60 mL/min
Glucose, Bld: 108 mg/dL — ABNORMAL HIGH (ref 70–99)
Potassium: 4 mmol/L (ref 3.5–5.1)
Sodium: 138 mmol/L (ref 135–145)
Total Bilirubin: 0.7 mg/dL (ref 0.0–1.2)
Total Protein: 7.7 g/dL (ref 6.5–8.1)

## 2024-02-07 LAB — CEA (ACCESS): CEA (CHCC): 106.92 ng/mL — ABNORMAL HIGH (ref 0.00–5.00)

## 2024-02-07 LAB — TOTAL PROTEIN, URINE DIPSTICK: Protein, ur: NEGATIVE mg/dL

## 2024-02-07 MED ORDER — SODIUM CHLORIDE 0.9 % IV SOLN
600.0000 mg | Freq: Once | INTRAVENOUS | Status: AC
  Administered 2024-02-07: 600 mg via INTRAVENOUS
  Filled 2024-02-07: qty 16

## 2024-02-07 MED ORDER — SODIUM CHLORIDE 0.9 % IV SOLN: Freq: Once | INTRAVENOUS | Status: AC

## 2024-02-07 NOTE — Patient Instructions (Signed)
 [facility]  Discharge Instructions: Thank you for choosing [facility] to provide your oncology and hematology care.   If you have a lab appointment with the Cancer Center, please go directly to the Cancer Center and check in at the registration area.   Wear comfortable clothing and clothing appropriate for easy access to any Portacath or PICC line.   We strive to give you quality time with your provider. You may need to reschedule your appointment if you arrive late (15 or more minutes).  Arriving late affects you and other patients whose appointments are after yours.  Also, if you miss three or more appointments without notifying the office, you may be dismissed from the clinic at the provider's discretion.      For prescription refill requests, have your pharmacy contact our office and allow 72 hours for refills to be completed.    Today you received the following chemotherapy and/or immunotherapy agents Avastin      To help prevent nausea and vomiting after your treatment, we encourage you to take your nausea medication as directed.  BELOW ARE SYMPTOMS THAT SHOULD BE REPORTED IMMEDIATELY: *FEVER GREATER THAN 100.4 F (38 C) OR HIGHER *CHILLS OR SWEATING *NAUSEA AND VOMITING THAT IS NOT CONTROLLED WITH YOUR NAUSEA MEDICATION *UNUSUAL SHORTNESS OF BREATH *UNUSUAL BRUISING OR BLEEDING *URINARY PROBLEMS (pain or burning when urinating, or frequent urination) *BOWEL PROBLEMS (unusual diarrhea, constipation, pain near the anus) TENDERNESS IN MOUTH AND THROAT WITH OR WITHOUT PRESENCE OF ULCERS (sore throat, sores in mouth, or a toothache) UNUSUAL RASH, SWELLING OR PAIN  UNUSUAL VAGINAL DISCHARGE OR ITCHING   Items with * indicate a potential emergency and should be followed up as soon as possible or go to the Emergency Department if any problems should occur.  Please show the CHEMOTHERAPY ALERT CARD or IMMUNOTHERAPY  ALERT CARD at check-in to the Emergency Department and triage nurse.  Should you have questions after your visit or need to cancel or reschedule your appointment, please contact [facility]  Dept: [phone]  and follow the prompts.  Office hours are 8:00 a.m. to 4:30 p.m. Monday - Friday. Please note that voicemails left after 4:00 p.m. may not be returned until the following business day.  We are closed weekends and major holidays. You have access to a nurse at all times for urgent questions. Please call the main number to the clinic Dept: [phone] and follow the prompts.   For any non-urgent questions, you may also contact your provider using MyChart. We now offer e-Visits for anyone 44 and older to request care online for non-urgent symptoms. For details visit [email or web address].   Also download the MyChart app! Go to the app store, search "MyChart", open the app, select [place], and log in with your MyChart username and password.

## 2024-02-07 NOTE — Progress Notes (Signed)
 " [facility] OFFICE PROGRESS NOTE   Diagnosis: Colon cancer  INTERVAL HISTORY:   Mr. Steelman returns as scheduled.  He continues Xeloda  7 days on/7 days off and every 3-week Avastin .  No significant nausea.  No mouth sores.  No diarrhea.  No hand or foot pain or redness.  Overall good energy level.  He has a good appetite but is limiting intake due to belching after eating.  The belching does not improve his week off of Xeloda .  He denies bleeding.    Objective:  Vital signs in last 24 hours:  Blood pressure 138/72, pulse 73, temperature 98.2 F (36.8 C), temperature source Temporal, resp. rate 18, height 5' 8 (1.727 m), weight 182 lb 12.8 oz (82.9 kg), SpO2 100%.    HEENT: No thrush or ulcers. Resp: Decreased breath sounds at the left compared to right posterior chest.  No respiratory distress. Cardio: Regular rate and rhythm. GI: No hepatosplenomegaly.  No mass.  Nontender. Vascular: No leg edema. Skin: Palms without erythema. Port-A-Cath without erythema.  Lab Results:  Lab Results  Component Value Date   WBC 7.8 02/07/2024   HGB 15.8 02/07/2024   HCT 45.6 02/07/2024   MCV 94.8 02/07/2024   PLT 327 02/07/2024   NEUTROABS 5.3 02/07/2024    Imaging:  No results found.  Medications: I have reviewed the patient's current medications.  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), status post a sigmoid colectomy and partial bladder resection 09/19/2020 2 separate sigmoid colon tumors, 2.5 cm apart, 1/13 lymph nodes positive, tumor extends to adherent soft tissue of the bladder, MSS, normal mismatch repair protein expression tumor mutation burden 0, K-ras G12D CT abdomen/pelvis 09/19/2020-sigmoid colon mass with colonic obstruction, 6 mm right lower lobe nodule CT chest 09/22/2020-multiple bilateral lung nodules consistent with metastases Elevated preoperative CEA Cycle 1 FOLFOX 10/28/2020 Cycle 2 FOLFOX 11/11/2020 Cycle 3 FOLFOX  11/25/2020 Cycle 4 FOLFOX 12/09/2020 Cycle 5 FOLFOX 12/23/2020 CT chest 12/25/2020-unchanged lung nodules, 2 cm subpleural nodule in the left posterior chest more prominent-loculated fluid? Cycle 6 FOLFOX 02/03/2021-5-FU dose reduced and bolus eliminated secondary to diarrhea Cycle 7 FOLFOX 02/17/2021 Cycle 8 FOLFOX 03/03/2021 PET 03/17/2021 pulmonary nodules without significant change, 10 mm nodule in right middle lobe with mild FDG uptake, mildly hypermetabolic pleural nodule in the lower left hemothorax, new clustered nodularity in the apical and posterior right upper lobe-likely infectious CTs 07/23/2021-new left pleural effusion with multiple pleural nodules, increased size of several pulmonary nodules, stable left paracolic gutter nodule, morphologic features suggestive of early cirrhosis Thoracentesis 07/24/2021-1200 cc of fluid removed, rare atypical cells present Thoracentesis 627 2023-1.5 L of fluid removed, atypical cells present, acute inflammation and blood Thoracentesis 08/14/2021- 1.2 L of fluid removed, adenocarcinoma compatible with metastatic colon cancer Thoracentesis 08/21/2021-adenocarcinoma Cycle 1 FOLFIRI/bevacizumab  09/01/2021 Cycle 2 FOLFIRI/bevacizumab  09/15/2021 Cycle 3 FOLFIRI/bevacizumab  09/29/2021-Decadron  prophylaxis added for delayed nausea Cycle 4 FOLFIRI/bevacizumab  10/13/2021 Cycle 5 FOLFIRI/bevacizumab  10/27/2021 CT chest 10/28/2021-improvement in left pleural effusion, rule/parenchymal metastases-some are slightly smaller, no new lesions Cycle 6 FOLFIRI/bevacizumab  11/10/2021 Cycle 7 FOLFIRI/bevacizumab  12/01/2021 Cycle 8 FOLFIRI/bevacizumab  12/22/2021 CT chest 01/07/2022-stable pulmonary parenchymal and pleural metastatic disease. Cycle 9 FOLFIRI/bevacizumab  02/09/2022 03/02/2022 treatment discontinued due to poor tolerance Xeloda  7 days on/7 days off beginning 03/08/2022, Avastin  continued every 3 weeks CT chest 03/09/2022-no significant change in appearance of bilateral  pulmonary metastasis, trace left pleural effusion, nodular liver contour Xeloda  7 days on/7 days off and Avastin  every 3 weeks CT chest 06/01/2022-mild enlargement of several pulmonary metastases, trace  left pleural effusion Xeloda  7 days on/7 days off and Avastin  every 3 weeks 06/17/2022 Xeloda  resume 7 days on/7 days off 08/03/2022, declines Avastin  CTs 08/19/2022-pleural lung nodules appear to be slightly diminished in size, pulmonary parenchymal nodules appear to be slightly enlarged, overall little change.  No evidence of lymphadenopathy or metastatic disease in the abdomen or pelvis. Xeloda  continued 7 days on/7 days off 08/24/2022 Xeloda  dose reduced to 2 tablets twice daily 7 days on/7 days off beginning 10/21/2022 due to mucositis CTs 11/11/2022-pulmonary nodules have enlarged slightly from 08/19/2022.  Pleural nodularity thickening and fluid in the left lower hemithorax is similar with slight mass effect on the left hemidiaphragm. Xeloda  continued 7 days on/7 days off 11/18/2022 CTs 02/03/2023: No change in pulmonary nodules, nodular densities overlying the bladder dome have enlarged Avastin  resumed 02/15/2023 CTs 05/06/2023-no significant interval change Xeloda  7 days on/7 days off plus Avastin  every 3 weeks continued Avastin  held per patient request 06/21/2023 Avastin  held per patient request 07/12/2023 CTs 07/26/2023: Increase in size of multiple pulmonary nodules/masses, increased soft tissue nodularity at the bladder dome (measured multiple lung lesions in the bladder dome mass, some of the lesions measure slightly larger and others are stable Avastin  08/02/2023 Xeloda  7 days on/7 days off plus Avastin  every 3 weeks 10/04/2023 CTs 10/20/2023-stable bilateral pulmonary nodules and masses.  No new pulmonary nodules.  No mediastinal adenopathy.  Stable nodular thickening along the dome of the bladder.  Stable nodular peritoneal thickening left paracolic gutter.  No new or progressive metastatic disease  in the abdomen/pelvis. Xeloda  7 days on/7 days off plus Avastin  every 3 weeks 11/29/2023 Xeloda  7 days on/7 days off plus Avastin  01/13/2024 Xeloda  7 days on/7 days off plus Avastin  02/07/2024    2.  Port-A-Cath placement-Dr. 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  Vertigo April 2024 5.  MRI brain 07/20/2022-no acute or reversible finding.  No evidence of metastatic disease.  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 6.   Belching/bloating: Upper endoscopy 11/10/2023: Normal esophagus and duodenum, mild erythematous mucosa in the stomach, biopsy-negative for gastritis or H. pylori  Disposition: Mr. Hettinger appears stable.  He continues Xeloda  7 days on/7 days off plus Avastin  every 3 weeks.  Plan to continue the same.  Restaging CTs prior to next office visit.  CBC and chemistry panel reviewed.  Labs are adequate for treatment.  Urine is negative for protein.  Etiology of the persistent belching is unclear.  He will try omeprazole the week off of Xeloda .  He will try small frequent meals.  He has an appointment with gastroenterology in February.  He will return for follow-up and Avastin  in 3 weeks.  We are available to see him sooner if needed.  Plan reviewed with Dr. Cloretta.    Olam Ned ANP/GNP-BC   02/07/2024  9:10 AM        "

## 2024-02-07 NOTE — Progress Notes (Signed)
 Patient seen by Olam Ned NP today  Vitals are within treatment parameters:Yes   Labs are within treatment parameters: Yes   Treatment plan has been signed: Yes   Per physician team, Patient is ready for treatment and there are NO modifications to the treatment plan.

## 2024-02-17 ENCOUNTER — Other Ambulatory Visit: Payer: Self-pay

## 2024-02-21 ENCOUNTER — Other Ambulatory Visit: Payer: Self-pay

## 2024-02-22 ENCOUNTER — Encounter (HOSPITAL_BASED_OUTPATIENT_CLINIC_OR_DEPARTMENT_OTHER): Payer: Self-pay

## 2024-02-23 ENCOUNTER — Ambulatory Visit (HOSPITAL_BASED_OUTPATIENT_CLINIC_OR_DEPARTMENT_OTHER)
Admission: RE | Admit: 2024-02-23 | Discharge: 2024-02-23 | Disposition: A | Discharge status: Home / Self Care | Source: Ambulatory Visit | Attending: Nurse Practitioner | Admitting: Nurse Practitioner

## 2024-02-23 ENCOUNTER — Other Ambulatory Visit: Payer: Self-pay

## 2024-02-23 DIAGNOSIS — C187 Malignant neoplasm of sigmoid colon: Secondary | ICD-10-CM | POA: Insufficient documentation

## 2024-02-23 MED ORDER — HEPARIN SOD (PORK) LOCK FLUSH 100 UNIT/ML IV SOLN
500.0000 [IU] | Freq: Once | INTRAVENOUS | Status: AC
  Administered 2024-02-23: 500 [IU] via INTRAVENOUS

## 2024-02-23 MED ORDER — IOHEXOL 300 MG/ML  SOLN
100.0000 mL | Freq: Once | INTRAMUSCULAR | Status: AC | PRN
  Administered 2024-02-23: 100 mL via INTRAVENOUS

## 2024-02-23 MED ORDER — HEPARIN SOD (PORK) LOCK FLUSH 100 UNIT/ML IV SOLN: 500.0000 [IU] | Freq: Once | INTRAVENOUS | Status: DC

## 2024-02-23 NOTE — Progress Notes (Signed)
 Specialty Pharmacy Refill Coordination Note  [facility] is a 84 y.o. male contacted today regarding refills of specialty medication(s) Capecitabine  (XELODA )   Patient requested Delivery   Delivery date: 02/29/24   Verified address: [address] [place] [address]   Medication will be filled on: 02/28/24

## 2024-02-25 ENCOUNTER — Other Ambulatory Visit: Payer: Self-pay

## 2024-02-26 ENCOUNTER — Other Ambulatory Visit: Payer: Self-pay | Admitting: Oncology

## 2024-02-28 ENCOUNTER — Encounter: Payer: Self-pay | Admitting: Nurse Practitioner

## 2024-02-28 ENCOUNTER — Inpatient Hospital Stay

## 2024-02-28 ENCOUNTER — Inpatient Hospital Stay: Admitting: Nurse Practitioner

## 2024-02-28 ENCOUNTER — Inpatient Hospital Stay: Attending: Oncology

## 2024-02-28 VITALS — BP 142/82 | HR 93 | Temp 97.7°F | Resp 18 | Ht 68.0 in | Wt 183.5 lb

## 2024-02-28 DIAGNOSIS — R142 Eructation: Secondary | ICD-10-CM | POA: Insufficient documentation

## 2024-02-28 DIAGNOSIS — C7801 Secondary malignant neoplasm of right lung: Secondary | ICD-10-CM | POA: Diagnosis present

## 2024-02-28 DIAGNOSIS — C7802 Secondary malignant neoplasm of left lung: Secondary | ICD-10-CM | POA: Diagnosis present

## 2024-02-28 DIAGNOSIS — C187 Malignant neoplasm of sigmoid colon: Secondary | ICD-10-CM | POA: Insufficient documentation

## 2024-02-28 LAB — CMP (CANCER CENTER ONLY)
ALT: 12 U/L (ref 0–44)
AST: 20 U/L (ref 15–41)
Albumin: 4.2 g/dL (ref 3.5–5.0)
Alkaline Phosphatase: 87 U/L (ref 38–126)
Anion gap: 11 (ref 5–15)
BUN: 17 mg/dL (ref 8–23)
CO2: 28 mmol/L (ref 22–32)
Calcium: 9.8 mg/dL (ref 8.9–10.3)
Chloride: 96 mmol/L — ABNORMAL LOW (ref 98–111)
Creatinine: 0.82 mg/dL (ref 0.61–1.24)
GFR, Estimated: >60 mL/min
Glucose, Bld: 100 mg/dL — ABNORMAL HIGH (ref 70–99)
Potassium: 3.9 mmol/L (ref 3.5–5.1)
Sodium: 135 mmol/L (ref 135–145)
Total Bilirubin: 0.7 mg/dL (ref 0.0–1.2)
Total Protein: 8.1 g/dL (ref 6.5–8.1)

## 2024-02-28 LAB — CBC WITH DIFFERENTIAL (CANCER CENTER ONLY)
Abs Immature Granulocytes: 0.03 K/uL (ref 0.00–0.07)
Basophils Absolute: 0.2 K/uL — ABNORMAL HIGH (ref 0.0–0.1)
Basophils Relative: 2 %
Eosinophils Absolute: 0.2 K/uL (ref 0.0–0.5)
Eosinophils Relative: 2 %
HCT: 48.1 % (ref 39.0–52.0)
Hemoglobin: 16.5 g/dL (ref 13.0–17.0)
Immature Granulocytes: 0 %
Lymphocytes Relative: 18 %
Lymphs Abs: 1.8 K/uL (ref 0.7–4.0)
MCH: 32.9 pg (ref 26.0–34.0)
MCHC: 34.3 g/dL (ref 30.0–36.0)
MCV: 95.8 fL (ref 80.0–100.0)
Monocytes Absolute: 0.7 K/uL (ref 0.1–1.0)
Monocytes Relative: 7 %
Neutro Abs: 7.1 K/uL (ref 1.7–7.7)
Neutrophils Relative %: 71 %
Platelet Count: 302 K/uL (ref 150–400)
RBC: 5.02 MIL/uL (ref 4.22–5.81)
RDW: 14 % (ref 11.5–15.5)
WBC Count: 9.9 K/uL (ref 4.0–10.5)
nRBC: 0 % (ref 0.0–0.2)

## 2024-02-28 LAB — TOTAL PROTEIN, URINE DIPSTICK: Protein, ur: NEGATIVE mg/dL

## 2024-02-28 LAB — CEA (ACCESS): CEA (CHCC): 116.13 ng/mL — ABNORMAL HIGH (ref 0.00–5.00)

## 2024-02-28 MED ORDER — OXYCODONE HCL 5 MG PO TABS: 5.0000 mg | ORAL_TABLET | Freq: Every day | ORAL | 0 refills | Status: AC | PRN

## 2024-02-28 NOTE — Progress Notes (Unsigned)
 " [facility] OFFICE PROGRESS NOTE   Diagnosis: Colon cancer  INTERVAL HISTORY:   [facility] returns as scheduled.  He continues Xeloda  and Avastin .  He reports feeling well except for the belching.  He takes oxycodone  about every other day due to chest discomfort which he relates to belching.  He also relates decreased oral intake and nausea to frequent belching.  No diarrhea.  No mouth sores.  No bleeding.  Objective:  Vital signs in last 24 hours:  Blood pressure (!) 142/82, pulse 93, temperature 97.7 F (36.5 C), temperature source Temporal, resp. rate 18, height 5' 8 (1.727 m), weight 183 lb 8 oz (83.2 kg), SpO2 98%.    HEENT: No thrush or ulcers. Resp: Diminished breath sounds left compared to right lung field.  No respiratory distress.   Cardio: Regular rate and rhythm. GI: No hepatosplenomegaly.  Nontender.  Left lower quadrant colostomy. Vascular: No leg edema. Neuro: Alert and oriented. Skin: Palms without erythema. Port-A-Cath without erythema.  Lab Results:  Lab Results  Component Value Date   WBC 9.9 02/28/2024   HGB 16.5 02/28/2024   HCT 48.1 02/28/2024   MCV 95.8 02/28/2024   PLT 302 02/28/2024   NEUTROABS 7.1 02/28/2024    Imaging:  No results found.  Medications: I have reviewed the patient's current medications.  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), status post a sigmoid colectomy and partial bladder resection 09/19/2020 2 separate sigmoid colon tumors, 2.5 cm apart, 1/13 lymph nodes positive, tumor extends to adherent soft tissue of the bladder, MSS, normal mismatch repair protein expression tumor mutation burden 0, K-ras G12D CT abdomen/pelvis 09/19/2020-sigmoid colon mass with colonic obstruction, 6 mm right lower lobe nodule CT chest 09/22/2020-multiple bilateral lung nodules consistent with metastases Elevated preoperative CEA Cycle 1 FOLFOX 10/28/2020 Cycle 2 FOLFOX 11/11/2020 Cycle 3 FOLFOX  11/25/2020 Cycle 4 FOLFOX 12/09/2020 Cycle 5 FOLFOX 12/23/2020 CT chest 12/25/2020-unchanged lung nodules, 2 cm subpleural nodule in the left posterior chest more prominent-loculated fluid? Cycle 6 FOLFOX 02/03/2021-5-FU dose reduced and bolus eliminated secondary to diarrhea Cycle 7 FOLFOX 02/17/2021 Cycle 8 FOLFOX 03/03/2021 PET 03/17/2021 pulmonary nodules without significant change, 10 mm nodule in right middle lobe with mild FDG uptake, mildly hypermetabolic pleural nodule in the lower left hemothorax, new clustered nodularity in the apical and posterior right upper lobe-likely infectious CTs 07/23/2021-new left pleural effusion with multiple pleural nodules, increased size of several pulmonary nodules, stable left paracolic gutter nodule, morphologic features suggestive of early cirrhosis Thoracentesis 07/24/2021-1200 cc of fluid removed, rare atypical cells present Thoracentesis 627 2023-1.5 L of fluid removed, atypical cells present, acute inflammation and blood Thoracentesis 08/14/2021- 1.2 L of fluid removed, adenocarcinoma compatible with metastatic colon cancer Thoracentesis 08/21/2021-adenocarcinoma Cycle 1 FOLFIRI/bevacizumab  09/01/2021 Cycle 2 FOLFIRI/bevacizumab  09/15/2021 Cycle 3 FOLFIRI/bevacizumab  09/29/2021-Decadron  prophylaxis added for delayed nausea Cycle 4 FOLFIRI/bevacizumab  10/13/2021 Cycle 5 FOLFIRI/bevacizumab  10/27/2021 CT chest 10/28/2021-improvement in left pleural effusion, rule/parenchymal metastases-some are slightly smaller, no new lesions Cycle 6 FOLFIRI/bevacizumab  11/10/2021 Cycle 7 FOLFIRI/bevacizumab  12/01/2021 Cycle 8 FOLFIRI/bevacizumab  12/22/2021 CT chest 01/07/2022-stable pulmonary parenchymal and pleural metastatic disease. Cycle 9 FOLFIRI/bevacizumab  02/09/2022 03/02/2022 treatment discontinued due to poor tolerance Xeloda  7 days on/7 days off beginning 03/08/2022, Avastin  continued every 3 weeks CT chest 03/09/2022-no significant change in appearance of bilateral  pulmonary metastasis, trace left pleural effusion, nodular liver contour Xeloda  7 days on/7 days off and Avastin  every 3 weeks CT chest 06/01/2022-mild enlargement of several pulmonary metastases, trace left pleural effusion Xeloda  7 days on/7 days  off and Avastin  every 3 weeks 06/17/2022 Xeloda  resume 7 days on/7 days off 08/03/2022, declines Avastin  CTs 08/19/2022-pleural lung nodules appear to be slightly diminished in size, pulmonary parenchymal nodules appear to be slightly enlarged, overall little change.  No evidence of lymphadenopathy or metastatic disease in the abdomen or pelvis. Xeloda  continued 7 days on/7 days off 08/24/2022 Xeloda  dose reduced to 2 tablets twice daily 7 days on/7 days off beginning 10/21/2022 due to mucositis CTs 11/11/2022-pulmonary nodules have enlarged slightly from 08/19/2022.  Pleural nodularity thickening and fluid in the left lower hemithorax is similar with slight mass effect on the left hemidiaphragm. Xeloda  continued 7 days on/7 days off 11/18/2022 CTs 02/03/2023: No change in pulmonary nodules, nodular densities overlying the bladder dome have enlarged Avastin  resumed 02/15/2023 CTs 05/06/2023-no significant interval change Xeloda  7 days on/7 days off plus Avastin  every 3 weeks continued Avastin  held per patient request 06/21/2023 Avastin  held per patient request 07/12/2023 CTs 07/26/2023: Increase in size of multiple pulmonary nodules/masses, increased soft tissue nodularity at the bladder dome (measured multiple lung lesions in the bladder dome mass, some of the lesions measure slightly larger and others are stable Avastin  08/02/2023 Xeloda  7 days on/7 days off plus Avastin  every 3 weeks 10/04/2023 CTs 10/20/2023-stable bilateral pulmonary nodules and masses.  No new pulmonary nodules.  No mediastinal adenopathy.  Stable nodular thickening along the dome of the bladder.  Stable nodular peritoneal thickening left paracolic gutter.  No new or progressive metastatic disease  in the abdomen/pelvis. Xeloda  7 days on/7 days off plus Avastin  every 3 weeks 11/29/2023 Xeloda  7 days on/7 days off plus Avastin  01/13/2024 Xeloda  7 days on/7 days off plus Avastin  02/07/2024 CTs 02/23/2024-subtle generalized increase in size of bilateral pulmonary nodules and masses.  Stable nodular thickening along the peritoneum of the left lower abdomen/upper pelvis.  Focal lobular bladder wall thickening anteriorly, similar. 02/28/2024-recommendation to continue current treatment.  He declines treatment 02/28/2024, agrees to resume 03/20/2024    2.  Port-A-Cath placement-Dr. 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  Vertigo April 2024 5.  MRI brain 07/20/2022-no acute or reversible finding.  No evidence of metastatic disease.  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 6.   Belching/bloating: Upper endoscopy 11/10/2023: Normal esophagus and duodenum, mild erythematous mucosa in the stomach, biopsy-negative for gastritis or H. pylori    Disposition: Mr. Lange appears unchanged.  He is currently on active treatment with Xeloda  7 days on/7 days off and Avastin  every 3 weeks.  Treatment has not been consistent over the past several months.  Recent restaging CTs show minimal progression involving lung nodules, no new disease.  Results/images reviewed with him and his wife at today's visit.  He understands and agrees with the recommendation to continue current treatment.  His main focus is determining a cause for the belching.  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  He reports being scheduled for an H. pylori test in 2 weeks.  He declines further treatment with Xeloda /Avastin  until this test has been completed.  He will return for follow-up and treatment as scheduled in 3 weeks.  We are available to see him sooner if  needed.  Patient seen with Dr. Cloretta.    Olam Ned ANP/GNP-BC   02/28/2024  8:20 AM  This was a shared visit with Olam Ned.  We reviewed the restaging CT  findings and images with Mr. Hinzman.  Some of the lung lesions have increased in size.  No other radiologic evidence of disease progression.  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  We recommend he continue Xeloda /Avastin  on a consistent schedule and plan for a repeat CT in 3 months.  Mr. Beevers declines further systemic therapy until he undergoes further evaluation of the belching by gastroenterology.  Arvella Hof, MD      "

## 2024-02-29 ENCOUNTER — Inpatient Hospital Stay

## 2024-02-29 ENCOUNTER — Other Ambulatory Visit: Payer: Self-pay

## 2024-02-29 ENCOUNTER — Encounter: Payer: Self-pay | Admitting: Oncology

## 2024-02-29 ENCOUNTER — Inpatient Hospital Stay: Admitting: Oncology

## 2024-03-01 ENCOUNTER — Ambulatory Visit (INDEPENDENT_AMBULATORY_CARE_PROVIDER_SITE_OTHER): Admitting: Family

## 2024-03-01 ENCOUNTER — Other Ambulatory Visit: Payer: Self-pay

## 2024-03-01 ENCOUNTER — Encounter: Payer: Self-pay | Admitting: Family

## 2024-03-01 VITALS — BP 148/82 | HR 93 | Temp 98.1°F | Ht 68.0 in | Wt 183.4 lb

## 2024-03-01 DIAGNOSIS — Z933 Colostomy status: Secondary | ICD-10-CM | POA: Diagnosis not present

## 2024-03-01 DIAGNOSIS — I10 Essential (primary) hypertension: Secondary | ICD-10-CM | POA: Diagnosis not present

## 2024-03-01 DIAGNOSIS — C187 Malignant neoplasm of sigmoid colon: Secondary | ICD-10-CM | POA: Diagnosis not present

## 2024-03-01 NOTE — Progress Notes (Signed)
 "  New Patient Office Visit  Subjective:  Patient ID: Craig [facility], male    DOB: 1940-12-17  Age: 84 y.o. MRN: [id number]  CC:  Chief Complaint  Patient presents with   New Patient (Initial Visit)   Hypertension   HPI Craig [facility] presents for establishing care today. Discussed the use of AI scribe software for clinical note transcription with the patient, who gave verbal consent to proceed.  History of Present Illness Craig Best is an 84 year old male with metastatic colon cancer who presents for establishment of care and management of his condition.  He has metastatic colon cancer to liver, lung, and bladder. He reports no known new lesions and that imaging has shown stable disease. He had partial colectomy and receives IV chemotherapy every three weeks plus an oral agent since August 2022 with short breaks. He has fatigue and lethargy from treatment and mild nausea controlled with as-needed Compazine . He has hypertension on low-dose amlodipine , which he takes inconsistently. Home blood pressures are usually about 140/80-90 mmHg, higher in the mornings. He has associated headaches and head pressure that he feels is sinus related. He has belching and postprandial chest discomfort. Prior endoscopy showed irritation, but H. pylori has not been ruled out. Omeprazole was stopped due to potential interaction with chemotherapy, and he now uses Pepcid twice daily. He uses oxycodone  as needed for chest discomfort and Tylenol  for other pain. He takes Miralax  daily to manage bowel movements affected by medications and his colostomy bag. 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  Assessment & Plan Metastatic sigmoid colon cancer Metastatic to liver, lung, and bladder. Well-[facility] by [facility]) with no new lesions. Undergoing chemotherapy since August 2022. Reports fatigue, no significant nausea. Recent CT scan showed no new nodules. Awaiting PET scan results. -  Continue chemotherapy regimen every three weeks. - Await PET scan results. - Continue current pain management with oxycodone  as needed. - Continue Compazine  for nausea management.  Hypertension Managed with low-dose amlodipine  2.5mg . Reports noncompliance leading to elevated blood pressure. Home readings around 140/80 mmHg, with occasional higher readings. Reports headaches possibly related to elevated blood pressure. - Rechecked blood pressure during the visit, slightly lower. - Advised to take amlodipine  if home blood pressure readings exceed 140/90 mmHg, either number. - Encouraged regular monitoring of blood pressure at home. - F/U in 6 mos or prn  Gastroesophageal symptoms under evaluation Symptoms include belching and chest discomfort, possibly related to H. pylori. Previous endoscopy showed irritation, biopsy negative for H. pylori. Scheduled for breath test. Not taking omeprazole due to potential chemotherapy interference. - Proceed with scheduled breath test for H. pylori. - Continue Pepcid twice daily for symptom management.  Constipation with colostomy Managed with daily Miralax . Reports occasional constipation, likely exacerbated by pain medication. - Continue daily Miralax  to manage constipation. - Drink at least 2 liters of water or caffeine free liquids daily. - Will continue to monitor, f/u prn.   Subjective:    Outpatient Medications Prior to Visit  Medication Sig Dispense Refill   acetaminophen  (TYLENOL ) 500 MG tablet Take 2 tablets (1,000 mg total) by mouth every 6 (six) hours. 30 tablet 0   amLODipine  (NORVASC ) 2.5 MG tablet Take 1 tablet (2.5 mg total) by mouth daily. 30 tablet 2   capecitabine  (XELODA ) 500 MG tablet Take 2 tablets (1,000 mg total) by mouth 2 (two) times daily with a meal. Take for 7 days, then off for 7 days. Repeat every 14 days. 56 tablet 1  famotidine (PEPCID) 20 MG tablet Take 20 mg by mouth 2 (two) times daily. As needed      lidocaine -prilocaine  (EMLA ) cream Apply 1 Application topically as needed. 30 g 2   loratadine  (CLARITIN ) 10 MG tablet Take 10 mg by mouth daily.     oxyCODONE  (OXY IR/ROXICODONE ) 5 MG immediate release tablet Take 1 tablet (5 mg total) by mouth daily as needed for moderate pain (pain score 4-6). 30 tablet 0   Polyethylene Glycol 3350  (MIRALAX  PO) Take 17 g by mouth daily.     prochlorperazine  (COMPAZINE ) 10 MG tablet TAKE 1 TABLET BY MOUTH EVERY 6 HOURS AS NEEDED 60 tablet 1   simethicone  (MYLICON) 80 MG chewable tablet Chew 80 mg by mouth every 6 (six) hours as needed for flatulence.     ibuprofen (ADVIL) 600 MG tablet Take 600 mg by mouth every 6 (six) hours as needed. (Patient not taking: Reported on 02/28/2024)     Omeprazole-Sodium Bicarbonate (ZEGERID OTC PO) Take 1 tablet by mouth 2 (two) times daily. (Patient not taking: Reported on 02/28/2024)     ondansetron  (ZOFRAN ) 8 MG tablet Take 1 tablet (8 mg total) by mouth every 8 (eight) hours as needed for nausea or vomiting. (Patient not taking: Reported on 02/28/2024) 30 tablet 1   Facility-Administered Medications Prior to Visit  Medication Dose Route Frequency Provider Last Rate Last Admin   sodium chloride  flush (NS) 0.9 % injection 10 mL  10 mL Intravenous PRN Thomas, Lisa K, NP   10 mL at 08/18/21 1020   sodium chloride  flush (NS) 0.9 % injection 10 mL  10 mL Intravenous PRN Cloretta Arley NOVAK, MD   10 mL at 07/20/22 9077   sodium chloride  flush (NS) 0.9 % injection 10 mL  10 mL Intravenous PRN Cloretta Arley NOVAK, MD   10 mL at 08/03/22 0944   sodium chloride  flush (NS) 0.9 % injection 10 mL  10 mL Intravenous PRN Debby Olam POUR, NP   10 mL at 07/12/23 0859   Past Medical History:  Diagnosis Date   Cancer (HCC) 09/19/2020   adenocarcinoma sigmoid colon   Goals of care, counseling/discussion 10/17/2020   Partial bowel obstruction (HCC) 09/19/2020   Past Surgical History:  Procedure Laterality Date   IR THORACENTESIS RIGHT ASP PLEURAL  SPACE W/IMG GUIDE  08/14/2021   IR THORACENTESIS RIGHT ASP PLEURAL SPACE W/IMG GUIDE  08/21/2021   PARTIAL COLECTOMY N/A 09/19/2020   Procedure: PARTIAL COLECTOMY, WITH CREATION OF COLOSTOMY, COMPLEX BLADDER REPAIR;  Surgeon: Vernetta Berg, MD;  Location: [facility];  Service: General;  Laterality: N/A;   PORTACATH PLACEMENT Left 10/07/2020   Procedure: PORT-A-CATH INSERTION;  Surgeon: Vernetta Berg, MD;  [facility];  Service: General;  Laterality: Left;    Objective:   Today's Vitals: BP (!) 148/82 (BP Location: Left Arm, Patient Position: Sitting, Cuff Size: Large)   Pulse 92   Temp 98.1 F (36.7 C) (Temporal)   Ht 5' 8 (1.727 m)   Wt 183 lb 4 oz (83.1 kg)   SpO2 98%   BMI 27.86 kg/m   Physical Exam Vitals and nursing note reviewed.  Constitutional:      General: He is not in acute distress.    Appearance: Normal appearance.  HENT:     Head: Normocephalic.  Cardiovascular:     Rate and Rhythm: Normal rate and regular rhythm.  Pulmonary:     Effort: Pulmonary effort is normal.     Breath sounds: Normal breath  sounds.  Musculoskeletal:        General: Normal range of motion.     Cervical back: Normal range of motion.  Skin:    General: Skin is warm and dry.  Neurological:     Mental Status: He is alert and oriented to person, place, and time.  Psychiatric:        Mood and Affect: Mood normal.    No orders of the defined types were placed in this encounter.  Lucius Krabbe, NP "

## 2024-03-01 NOTE — Patient Instructions (Signed)
 Welcome to [facility], It was a pleasure meeting you today!   If your blood pressure is higher than 140/90, either number, then take 1 pill of your Amlodipine .  Please schedule a follow up visit for 6 months or sooner if needed.     PLEASE NOTE: If you had any LAB tests please let us  know if you have not heard back within a few days. You may see your results on MyChart before we have a chance to review them but we will give you a call once they are reviewed by us . If we ordered any REFERRALS today, please let us  know if you have not heard from their office within the next week.  Let us  know through MyChart if you are needing REFILLS, or have your pharmacy send us  the request. You can also use MyChart to communicate with me or any office staff.  Please try these tips to maintain a healthy lifestyle: It is important that you exercise regularly at least 30 minutes 5 times a week. Think about what you will eat, plan ahead. Choose whole foods, & think  clean, green, fresh or frozen over canned, processed or packaged foods which are more sugary, salty, and fatty. 70 to 75% of food eaten should be fresh vegetables and protein. 2-3  meals daily with healthy snacks between meals, but must be whole fruit, protein or vegetables. Aim to eat over a 10 hour period when you are active, for example, 7am to 5pm, and then STOP after your last meal of the day, drinking only water.  Shorter eating windows, 6-8 hours, are showing benefits in heart disease and blood sugar regulation. Drink water every day! Shoot for 64 ounces daily = 8 cups, no other drink is as healthy! Fruit juice is best enjoyed in a healthy way, by EATING the fruit.

## 2024-03-13 ENCOUNTER — Inpatient Hospital Stay

## 2024-03-13 ENCOUNTER — Inpatient Hospital Stay: Admitting: Oncology

## 2024-03-20 ENCOUNTER — Inpatient Hospital Stay: Admitting: Oncology

## 2024-03-20 ENCOUNTER — Inpatient Hospital Stay

## 2024-04-10 ENCOUNTER — Inpatient Hospital Stay

## 2024-04-10 ENCOUNTER — Inpatient Hospital Stay: Admitting: Oncology
